# Patient Record
Sex: Female | Born: 1975 | Race: White | Hispanic: No | Smoking: Current every day smoker
Health system: Southern US, Community
[De-identification: ages and names within clinical notes are randomized; demographics above are authoritative.]

## PROBLEM LIST (undated history)

## (undated) ENCOUNTER — Ambulatory Visit (HOSPITAL_COMMUNITY): Admission: EM | Payer: Medicaid Other | Source: Home / Self Care

## (undated) DIAGNOSIS — I10 Essential (primary) hypertension: Secondary | ICD-10-CM

## (undated) DIAGNOSIS — F32A Depression, unspecified: Secondary | ICD-10-CM

## (undated) DIAGNOSIS — N200 Calculus of kidney: Secondary | ICD-10-CM

## (undated) DIAGNOSIS — F329 Major depressive disorder, single episode, unspecified: Secondary | ICD-10-CM

## (undated) DIAGNOSIS — F1110 Opioid abuse, uncomplicated: Secondary | ICD-10-CM

## (undated) DIAGNOSIS — F419 Anxiety disorder, unspecified: Secondary | ICD-10-CM

## (undated) DIAGNOSIS — B009 Herpesviral infection, unspecified: Secondary | ICD-10-CM

## (undated) DIAGNOSIS — B1920 Unspecified viral hepatitis C without hepatic coma: Secondary | ICD-10-CM

## (undated) DIAGNOSIS — G43909 Migraine, unspecified, not intractable, without status migrainosus: Secondary | ICD-10-CM

## (undated) HISTORY — PX: TUBAL LIGATION: SHX77

## (undated) HISTORY — PX: OTHER SURGICAL HISTORY: SHX169

## (undated) HISTORY — PX: LITHOTRIPSY: SUR834

## (undated) HISTORY — PX: BREAST BIOPSY: SHX20

## (undated) HISTORY — PX: ELBOW SURGERY: SHX618

## (undated) HISTORY — PX: KIDNEY STONE SURGERY: SHX686

---

## 2000-12-30 ENCOUNTER — Encounter: Payer: Self-pay | Admitting: Emergency Medicine

## 2000-12-30 ENCOUNTER — Emergency Department (HOSPITAL_COMMUNITY): Admission: EM | Admit: 2000-12-30 | Discharge: 2000-12-30 | Payer: Self-pay | Admitting: Emergency Medicine

## 2003-08-02 ENCOUNTER — Emergency Department (HOSPITAL_COMMUNITY): Admission: EM | Admit: 2003-08-02 | Discharge: 2003-08-02 | Payer: Self-pay | Admitting: Emergency Medicine

## 2005-04-28 ENCOUNTER — Inpatient Hospital Stay (HOSPITAL_COMMUNITY): Admission: RE | Admit: 2005-04-28 | Discharge: 2005-05-10 | Payer: Self-pay | Admitting: Psychiatry

## 2005-04-29 ENCOUNTER — Ambulatory Visit: Payer: Self-pay | Admitting: Psychiatry

## 2005-05-07 ENCOUNTER — Ambulatory Visit: Payer: Self-pay | Admitting: Psychiatry

## 2005-09-13 ENCOUNTER — Emergency Department (HOSPITAL_COMMUNITY): Admission: EM | Admit: 2005-09-13 | Discharge: 2005-09-14 | Payer: Self-pay | Admitting: Emergency Medicine

## 2005-09-25 ENCOUNTER — Emergency Department (HOSPITAL_COMMUNITY): Admission: EM | Admit: 2005-09-25 | Discharge: 2005-09-25 | Payer: Self-pay | Admitting: Emergency Medicine

## 2009-08-16 ENCOUNTER — Ambulatory Visit: Payer: Self-pay | Admitting: Diagnostic Radiology

## 2009-08-16 ENCOUNTER — Emergency Department (HOSPITAL_BASED_OUTPATIENT_CLINIC_OR_DEPARTMENT_OTHER): Admission: EM | Admit: 2009-08-16 | Discharge: 2009-08-16 | Payer: Self-pay | Admitting: Emergency Medicine

## 2009-08-19 ENCOUNTER — Emergency Department (HOSPITAL_BASED_OUTPATIENT_CLINIC_OR_DEPARTMENT_OTHER): Admission: EM | Admit: 2009-08-19 | Discharge: 2009-08-19 | Payer: Self-pay | Admitting: Emergency Medicine

## 2009-08-19 ENCOUNTER — Ambulatory Visit: Payer: Self-pay | Admitting: Radiology

## 2009-11-17 ENCOUNTER — Emergency Department (HOSPITAL_BASED_OUTPATIENT_CLINIC_OR_DEPARTMENT_OTHER): Admission: EM | Admit: 2009-11-17 | Discharge: 2009-11-17 | Payer: Self-pay | Admitting: Emergency Medicine

## 2009-11-17 ENCOUNTER — Ambulatory Visit: Payer: Self-pay | Admitting: Radiology

## 2010-01-30 ENCOUNTER — Emergency Department (HOSPITAL_BASED_OUTPATIENT_CLINIC_OR_DEPARTMENT_OTHER): Admission: EM | Admit: 2010-01-30 | Discharge: 2010-01-30 | Payer: Self-pay | Admitting: Emergency Medicine

## 2010-06-23 ENCOUNTER — Emergency Department (HOSPITAL_BASED_OUTPATIENT_CLINIC_OR_DEPARTMENT_OTHER)
Admission: EM | Admit: 2010-06-23 | Discharge: 2010-06-23 | Payer: Self-pay | Source: Home / Self Care | Admitting: Emergency Medicine

## 2010-06-24 LAB — CBC
HCT: 41.6 % (ref 36.0–46.0)
Hemoglobin: 14.3 g/dL (ref 12.0–15.0)
MCH: 28.5 pg (ref 26.0–34.0)
MCHC: 34.4 g/dL (ref 30.0–36.0)
MCV: 83 fL (ref 78.0–100.0)
Platelets: 393 10*3/uL (ref 150–400)
RBC: 5.01 MIL/uL (ref 3.87–5.11)
RDW: 13 % (ref 11.5–15.5)
WBC: 9.4 10*3/uL (ref 4.0–10.5)

## 2010-06-24 LAB — COMPREHENSIVE METABOLIC PANEL
ALT: 17 U/L (ref 0–35)
AST: 18 U/L (ref 0–37)
Albumin: 4.3 g/dL (ref 3.5–5.2)
Alkaline Phosphatase: 68 U/L (ref 39–117)
BUN: 9 mg/dL (ref 6–23)
CO2: 25 mEq/L (ref 19–32)
Calcium: 9.2 mg/dL (ref 8.4–10.5)
Chloride: 107 mEq/L (ref 96–112)
Creatinine, Ser: 0.7 mg/dL (ref 0.4–1.2)
GFR calc Af Amer: >60 mL/min (ref >60)
GFR calc non Af Amer: >60 mL/min (ref >60)
Glucose, Bld: 105 mg/dL — ABNORMAL HIGH (ref 70–99)
Potassium: 4 mEq/L (ref 3.5–5.1)
Sodium: 142 mEq/L (ref 135–145)
Total Bilirubin: 0.8 mg/dL (ref 0.3–1.2)
Total Protein: 7.4 g/dL (ref 6.0–8.3)

## 2010-06-24 LAB — URINALYSIS, ROUTINE W REFLEX MICROSCOPIC
Bilirubin Urine: NEGATIVE
Ketones, ur: NEGATIVE mg/dL
Leukocytes, UA: NEGATIVE
Nitrite: NEGATIVE
Protein, ur: NEGATIVE mg/dL
Specific Gravity, Urine: 1.013 (ref 1.005–1.030)
Urine Glucose, Fasting: NEGATIVE mg/dL
Urobilinogen, UA: 0.2 mg/dL (ref 0.0–1.0)
pH: 8.5 — ABNORMAL HIGH (ref 5.0–8.0)

## 2010-06-24 LAB — URINE MICROSCOPIC-ADD ON

## 2010-06-24 LAB — DIFFERENTIAL
Basophils Absolute: 0 10*3/uL (ref 0.0–0.1)
Basophils Relative: 0 % (ref 0–1)
Eosinophils Absolute: 0 10*3/uL (ref 0.0–0.7)
Eosinophils Relative: 0 % (ref 0–5)
Lymphocytes Relative: 12 % (ref 12–46)
Lymphs Abs: 1.1 10*3/uL (ref 0.7–4.0)
Monocytes Absolute: 0.6 10*3/uL (ref 0.1–1.0)
Monocytes Relative: 6 % (ref 3–12)
Neutro Abs: 7.7 10*3/uL (ref 1.7–7.7)
Neutrophils Relative %: 82 % — ABNORMAL HIGH (ref 43–77)

## 2010-06-24 LAB — PREGNANCY, URINE: Preg Test, Ur: NEGATIVE

## 2010-06-24 LAB — LIPASE, BLOOD: Lipase: 33 U/L (ref 23–300)

## 2010-08-21 LAB — URINALYSIS, ROUTINE W REFLEX MICROSCOPIC
Bilirubin Urine: NEGATIVE
Glucose, UA: NEGATIVE mg/dL
Hgb urine dipstick: NEGATIVE
Ketones, ur: 40 mg/dL — AB
Nitrite: NEGATIVE
Protein, ur: NEGATIVE mg/dL
Specific Gravity, Urine: 1.028 (ref 1.005–1.030)
Urobilinogen, UA: 0.2 mg/dL (ref 0.0–1.0)
pH: 6 (ref 5.0–8.0)

## 2010-08-21 LAB — PREGNANCY, URINE: Preg Test, Ur: NEGATIVE

## 2010-08-24 LAB — COMPREHENSIVE METABOLIC PANEL
ALT: 12 U/L (ref 0–35)
AST: 18 U/L (ref 0–37)
Albumin: 4.5 g/dL (ref 3.5–5.2)
Alkaline Phosphatase: 60 U/L (ref 39–117)
BUN: 6 mg/dL (ref 6–23)
CO2: 25 mEq/L (ref 19–32)
Calcium: 9.6 mg/dL (ref 8.4–10.5)
Chloride: 108 mEq/L (ref 96–112)
Creatinine, Ser: 0.8 mg/dL (ref 0.4–1.2)
GFR calc Af Amer: >60 mL/min (ref >60)
GFR calc non Af Amer: >60 mL/min (ref >60)
Glucose, Bld: 131 mg/dL — ABNORMAL HIGH (ref 70–99)
Potassium: 3.2 mEq/L — ABNORMAL LOW (ref 3.5–5.1)
Sodium: 147 mEq/L — ABNORMAL HIGH (ref 135–145)
Total Bilirubin: 0.7 mg/dL (ref 0.3–1.2)
Total Protein: 8.3 g/dL (ref 6.0–8.3)

## 2010-08-24 LAB — URINE CULTURE
Colony Count: NO GROWTH
Culture: NO GROWTH

## 2010-08-24 LAB — URINALYSIS, ROUTINE W REFLEX MICROSCOPIC
Glucose, UA: NEGATIVE mg/dL
Hgb urine dipstick: NEGATIVE
Ketones, ur: 15 mg/dL — AB
Nitrite: NEGATIVE
Protein, ur: NEGATIVE mg/dL
Specific Gravity, Urine: 1.027 (ref 1.005–1.030)
Urobilinogen, UA: 1 mg/dL (ref 0.0–1.0)
pH: 6 (ref 5.0–8.0)

## 2010-08-24 LAB — CBC
HCT: 40.9 % (ref 36.0–46.0)
Hemoglobin: 13.9 g/dL (ref 12.0–15.0)
MCHC: 34 g/dL (ref 30.0–36.0)
MCV: 89.6 fL (ref 78.0–100.0)
Platelets: 346 10*3/uL (ref 150–400)
RBC: 4.56 MIL/uL (ref 3.87–5.11)
RDW: 12.6 % (ref 11.5–15.5)
WBC: 5.5 10*3/uL (ref 4.0–10.5)

## 2010-08-24 LAB — WET PREP, GENITAL
Clue Cells Wet Prep HPF POC: NONE SEEN
Trich, Wet Prep: NONE SEEN
Yeast Wet Prep HPF POC: NONE SEEN

## 2010-08-24 LAB — DIFFERENTIAL
Basophils Absolute: 0 10*3/uL (ref 0.0–0.1)
Basophils Relative: 0 % (ref 0–1)
Eosinophils Absolute: 0.1 10*3/uL (ref 0.0–0.7)
Eosinophils Relative: 1 % (ref 0–5)
Lymphocytes Relative: 19 % (ref 12–46)
Lymphs Abs: 1 10*3/uL (ref 0.7–4.0)
Monocytes Absolute: 0.3 10*3/uL (ref 0.1–1.0)
Monocytes Relative: 5 % (ref 3–12)
Neutro Abs: 4.1 10*3/uL (ref 1.7–7.7)
Neutrophils Relative %: 74 % (ref 43–77)

## 2010-08-24 LAB — LIPASE, BLOOD: Lipase: 50 U/L (ref 23–300)

## 2010-08-24 LAB — GC/CHLAMYDIA PROBE AMP, GENITAL
Chlamydia, DNA Probe: NEGATIVE
GC Probe Amp, Genital: NEGATIVE

## 2010-08-24 LAB — PREGNANCY, URINE: Preg Test, Ur: NEGATIVE

## 2010-08-31 LAB — URINALYSIS, ROUTINE W REFLEX MICROSCOPIC
Bilirubin Urine: NEGATIVE
Glucose, UA: NEGATIVE mg/dL
Hgb urine dipstick: NEGATIVE
Ketones, ur: NEGATIVE mg/dL
Ketones, ur: NEGATIVE mg/dL
Nitrite: NEGATIVE
Nitrite: NEGATIVE
Protein, ur: NEGATIVE mg/dL
Specific Gravity, Urine: 1.011 (ref 1.005–1.030)
Specific Gravity, Urine: 1.011 (ref 1.005–1.030)
Urobilinogen, UA: 0.2 mg/dL (ref 0.0–1.0)
pH: 6.5 (ref 5.0–8.0)
pH: 7 (ref 5.0–8.0)

## 2010-08-31 LAB — BASIC METABOLIC PANEL
BUN: 10 mg/dL (ref 6–23)
GFR calc non Af Amer: >60 mL/min (ref >60)
Glucose, Bld: 83 mg/dL (ref 70–99)
Potassium: 4 mEq/L (ref 3.5–5.1)

## 2010-08-31 LAB — WET PREP, GENITAL
Trich, Wet Prep: NONE SEEN
Yeast Wet Prep HPF POC: NONE SEEN

## 2010-08-31 LAB — PREGNANCY, URINE: Preg Test, Ur: NEGATIVE

## 2010-08-31 LAB — DIFFERENTIAL
Basophils Absolute: 0 10*3/uL (ref 0.0–0.1)
Eosinophils Absolute: 0.1 10*3/uL (ref 0.0–0.7)
Eosinophils Relative: 2 % (ref 0–5)
Lymphocytes Relative: 27 % (ref 12–46)

## 2010-08-31 LAB — CBC
HCT: 40.7 % (ref 36.0–46.0)
MCV: 89 fL (ref 78.0–100.0)
Platelets: 305 10*3/uL (ref 150–400)
RDW: 12.4 % (ref 11.5–15.5)

## 2010-08-31 LAB — URINE MICROSCOPIC-ADD ON

## 2010-08-31 LAB — GC/CHLAMYDIA PROBE AMP, GENITAL
Chlamydia, DNA Probe: NEGATIVE
GC Probe Amp, Genital: NEGATIVE

## 2010-10-23 NOTE — Discharge Summary (Signed)
NAME:  Nancy Terry, Nancy Terry NO.:  [id number]   MEDICAL RECORD NO.:  [id number]          PATIENT TYPE:  IPS   LOCATION:  0504                          FACILITY:  [facility]   PHYSICIAN:  Geoffery Lyons, M.D.      DATE OF BIRTH:  27-Feb-1976   DATE OF ADMISSION:  04/28/2005  DATE OF DISCHARGE:  05/10/2005                                 DISCHARGE SUMMARY   CHIEF COMPLAINT AND PRESENT ILLNESS:  This was the first admission to [facility] for this 35 year old white female, married,  voluntarily admitted.  Using Valium and Xanax.  IV drug use of heroin in the  past.  Crushing and shooting OxyContin for the last two weeks as well as  morphine.  Tried to detox herself.  Endorsed being depressed, irritable.  Mood swings, impulsive anger.   PAST PSYCHIATRIC HISTORY:  First time at [facility].  History of  nervous breakdown.  First time detoxed.   ALCOHOL/DRUG HISTORY:  As already stated, persistent use of mainly opiates.   MEDICAL HISTORY:  Stent placement in the left ureter.   MEDICATIONS:  Valium and Xanax.   PHYSICAL EXAMINATION:  Performed and failed to show any acute findings.   LABORATORY DATA:  Blood chemistry with potassium 3.4, sodium 143, glucose  119.  CBC with hemoglobin 14.3, hematocrit 42.  Drug screen positive for  opiates, benzodiazepines and marijuana.   MENTAL STATUS EXAM:  Alert, cooperative female.  Somewhat irritable,  anxious.  Speech normal rate, tempo and production although there was some  psychomotor retardation.  Mood anxious, irritable.  Affect anxious.  Thought  processes logical, coherent and relevant.  Focusing on her physical  complaints, being overwhelmed, some suicidal ruminations.  Wanting to get  better.  Cognition was well-preserved.   ADMISSION DIAGNOSES:  AXIS I:  Opiate dependence.  Benzodiazepine and  marijuana abuse.  AXIS II:  No diagnosis.  AXIS III:  Urinary calculi, pyuria not otherwise specified.  AXIS IV:   Moderate.  AXIS V:  GAF upon admission 30; highest GAF in the last year 65.   HOSPITAL COURSE:  She was admitted.  She was started in individual and group  psychotherapy.  She was initially on Percocet 1-2 as needed.  She was  abusing Ativan 0.5 mg twice a day, Valium 10 mg at night, Levaquin 500 mg  per day and clonidine 0.5 mg three times a day.  She was detoxed with  clonidine and Librium.  She was given Neurontin and trazodone for sleep.  We  continued to work with the trazodone.  She did endorse extensive history of  polysubstance dependence, lately IV opiates, said she would not get anything  from p.o. pain pills by mouth.  She started injecting.  Wanted to feel the  rush immediately.  She is married, self-employed.  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  Husband also opiate-dependent and also used cocaine.  He was  being admitted to [facility] for the same reason.  They were getting  to have a hard working.  She had three children,  sent them back to their  father who was not her present husband.  Tried to take care of the problem.  She has used benzodiazepines for years.  She minimized having a problem with  benzodiazepines.  Concerns were how to handle her anxiety once she was  detoxed.  She was on methadone up to 240 mg per day.  Started coming down,  came down to 90 mg and then she quit.  She compromised her system.  She had  a very difficult withdrawal, endorsed pain, cramping, GI distress, severe  problem with sleep, wanting to go to a residential program.  Continued to  endorse the GI symptoms, cramping, weakness, unable to keep food, decreased  appetite, upset, tearful, labile affect.  We extended the Librium detox and  increased the Neurontin.  She continued to struggle.  Trazodone was not  helpful with sleep.  Continued to increase the trazodone but it was not  effective.  We could not use the clonidine due to decreased blood pressure.  She was very upset with the  symptoms.  Very anxious, very fragile.  As she  detoxed, she got more and more depressed.  Still difficulty with anxiety,  with sleep.  We started working with the Zyprexa, did get some benefit from  the trazodone for anxiety.  She was wanting to maintain the trazodone.  Very  tearful.  She did complain of some racing thoughts or sleep.  By May 07, 2005, we started turning the corner.  The withdrawal symptoms were  improving.  We went ahead and had started the Lexapro and went up to 20 mg.  Sleep started getting better with the Zyprexa.  On May 10, 2005, she was  in full contact with reality.  There were no suicidal ideation, no homicidal  ideation, no hallucinations, no delusions.  Objectively, she was better.  There were still some mild lingering symptoms but she had tolerated the  medication well.  She was more hopeful.  She was going to pursue outpatient  treatment.  She was going to be eventually reuniting with the husband who  she felt was in a halfway house.   DISCHARGE DIAGNOSES:  AXIS I:  Opiate dependence.  Benzodiazepine and  marijuana abuse.  Mood disorder not otherwise specified.  AXIS II:  No diagnosis.  AXIS III:  Renal calculus, status post stent placement.  AXIS IV:  Moderate.  AXIS V:  GAF upon discharge 50-55.   DISCHARGE MEDICATIONS:  1.  Zyprexa 2.5 mg twice a day.  2.  Zyprexa Zydis 20 mg at night.  3.  Trazodone 50 mg twice a day.  4.  Trazodone 100 mg, 2 at night.  5.  Lexapro 20 mg per day.   FOLLOW UP:  [facility].  As she gets more and more  stable, these medications, especially Zyprexa, need to be reviewed and  probably decreased.      Geoffery Lyons, M.D.  Electronically Signed     IL/MEDQ  D:  05/17/2005  T:  05/18/2005  Job:  [id number]

## 2010-11-16 ENCOUNTER — Emergency Department (HOSPITAL_BASED_OUTPATIENT_CLINIC_OR_DEPARTMENT_OTHER)
Admission: EM | Admit: 2010-11-16 | Discharge: 2010-11-16 | Disposition: A | Discharge status: Home / Self Care | Payer: Self-pay | Attending: Emergency Medicine | Admitting: Emergency Medicine

## 2010-11-16 DIAGNOSIS — R51 Headache: Secondary | ICD-10-CM | POA: Insufficient documentation

## 2010-11-16 DIAGNOSIS — G8929 Other chronic pain: Secondary | ICD-10-CM | POA: Insufficient documentation

## 2010-12-04 ENCOUNTER — Emergency Department (HOSPITAL_BASED_OUTPATIENT_CLINIC_OR_DEPARTMENT_OTHER)
Admission: EM | Admit: 2010-12-04 | Discharge: 2010-12-04 | Disposition: A | Discharge status: Home / Self Care | Payer: Self-pay | Attending: Emergency Medicine | Admitting: Emergency Medicine

## 2010-12-04 DIAGNOSIS — G8929 Other chronic pain: Secondary | ICD-10-CM | POA: Insufficient documentation

## 2010-12-04 DIAGNOSIS — A6000 Herpesviral infection of urogenital system, unspecified: Secondary | ICD-10-CM | POA: Insufficient documentation

## 2011-03-06 ENCOUNTER — Encounter: Payer: Self-pay | Admitting: Emergency Medicine

## 2011-03-06 ENCOUNTER — Emergency Department (HOSPITAL_BASED_OUTPATIENT_CLINIC_OR_DEPARTMENT_OTHER)
Admission: EM | Admit: 2011-03-06 | Discharge: 2011-03-06 | Disposition: A | Discharge status: Home / Self Care | Payer: Self-pay | Attending: Emergency Medicine | Admitting: Emergency Medicine

## 2011-03-06 DIAGNOSIS — B9689 Other specified bacterial agents as the cause of diseases classified elsewhere: Secondary | ICD-10-CM | POA: Insufficient documentation

## 2011-03-06 DIAGNOSIS — A6000 Herpesviral infection of urogenital system, unspecified: Secondary | ICD-10-CM | POA: Insufficient documentation

## 2011-03-06 DIAGNOSIS — A499 Bacterial infection, unspecified: Secondary | ICD-10-CM | POA: Insufficient documentation

## 2011-03-06 DIAGNOSIS — K047 Periapical abscess without sinus: Secondary | ICD-10-CM | POA: Insufficient documentation

## 2011-03-06 DIAGNOSIS — F17200 Nicotine dependence, unspecified, uncomplicated: Secondary | ICD-10-CM | POA: Insufficient documentation

## 2011-03-06 DIAGNOSIS — N760 Acute vaginitis: Secondary | ICD-10-CM | POA: Insufficient documentation

## 2011-03-06 HISTORY — DX: Opioid abuse, uncomplicated: F11.10

## 2011-03-06 HISTORY — DX: Depression, unspecified: F32.A

## 2011-03-06 HISTORY — DX: Herpesviral infection, unspecified: B00.9

## 2011-03-06 HISTORY — DX: Anxiety disorder, unspecified: F41.9

## 2011-03-06 HISTORY — DX: Major depressive disorder, single episode, unspecified: F32.9

## 2011-03-06 LAB — PREGNANCY, URINE: Preg Test, Ur: NEGATIVE

## 2011-03-06 LAB — URINALYSIS, ROUTINE W REFLEX MICROSCOPIC
Nitrite: NEGATIVE
Specific Gravity, Urine: 1.01 (ref 1.005–1.030)
Urobilinogen, UA: 0.2 mg/dL (ref 0.0–1.0)

## 2011-03-06 LAB — URINE MICROSCOPIC-ADD ON

## 2011-03-06 LAB — WET PREP, GENITAL

## 2011-03-06 MED ORDER — OXYCODONE-ACETAMINOPHEN 5-325 MG PO TABS: 2.0000 | ORAL_TABLET | ORAL | Status: AC | PRN

## 2011-03-06 MED ORDER — VALACYCLOVIR HCL 1 G PO TABS: 1000.0000 mg | ORAL_TABLET | Freq: Three times a day (TID) | ORAL | Status: AC

## 2011-03-06 MED ORDER — OXYCODONE-ACETAMINOPHEN 5-325 MG PO TABS
2.0000 | ORAL_TABLET | Freq: Once | ORAL | Status: AC
  Administered 2011-03-06: 2 via ORAL
  Filled 2011-03-06: qty 2

## 2011-03-06 MED ORDER — LORAZEPAM 2 MG/ML IJ SOLN
1.0000 mg | Freq: Once | INTRAMUSCULAR | Status: AC
  Administered 2011-03-06: 1 mg via INTRAMUSCULAR
  Filled 2011-03-06: qty 1

## 2011-03-06 MED ORDER — IBUPROFEN 800 MG PO TABS: 800.0000 mg | ORAL_TABLET | Freq: Three times a day (TID) | ORAL | Status: AC

## 2011-03-06 MED ORDER — KETOROLAC TROMETHAMINE 60 MG/2ML IM SOLN
60.0000 mg | Freq: Once | INTRAMUSCULAR | Status: AC
  Administered 2011-03-06: 60 mg via INTRAMUSCULAR
  Filled 2011-03-06: qty 2

## 2011-03-06 MED ORDER — PENICILLIN V POTASSIUM 250 MG PO TABS
500.0000 mg | ORAL_TABLET | Freq: Once | ORAL | Status: AC
  Administered 2011-03-06: 500 mg via ORAL
  Filled 2011-03-06: qty 2

## 2011-03-06 MED ORDER — ONDANSETRON 4 MG PO TBDP
4.0000 mg | ORAL_TABLET | Freq: Once | ORAL | Status: AC
  Administered 2011-03-06: 4 mg via ORAL
  Filled 2011-03-06: qty 1

## 2011-03-06 MED ORDER — ONDANSETRON HCL 4 MG PO TABS: 4.0000 mg | ORAL_TABLET | Freq: Four times a day (QID) | ORAL | Status: AC

## 2011-03-06 MED ORDER — PENICILLIN V POTASSIUM 500 MG PO TABS: 500.0000 mg | ORAL_TABLET | Freq: Four times a day (QID) | ORAL | Status: AC

## 2011-03-06 MED ORDER — METRONIDAZOLE 500 MG PO TABS: 500.0000 mg | ORAL_TABLET | Freq: Two times a day (BID) | ORAL | Status: AC

## 2011-03-06 NOTE — ED Provider Notes (Signed)
History     CSN: [id number] Arrival date & time: 03/06/2011  9:41 AM  Chief Complaint  Patient presents with  . Dental Pain  . Vaginal Pain    (Consider location/radiation/quality/duration/timing/severity/associated sxs/prior treatment) HPI Comments: Patient complains of left sided dental pain for the past 2 days with left-sided facial swelling. She reports subjective fevers at home she has not seen a dentist in over a year. She has no difficulty breathing or swallowing, she has no drooling. She denies any nausea or vomiting. She also complains of an outbreak of vaginal herpes which is diagnosed in the ER in June. She states acyclovir she was given causes her to have hallucinations and she is requesting Valtrex. She denies any discharge or vaginal bleeding. She denies abdominal pain or back pain.  The history is provided by the patient.    Past Medical History  Diagnosis Date  . Anxiety   . Herpes infection   . Depression   . Narcotic abuse     Past Surgical History  Procedure Date  . Arm surgery   . Tubal ligation     History reviewed. No pertinent family history.  History  Substance Use Topics  . Smoking status: Current Some Day Smoker -- 0.5 packs/day    Types: Cigarettes  . Smokeless tobacco: Not on file  . Alcohol Use: No    OB History    Grav Para Term Preterm Abortions TAB SAB Ect Mult Living                  Review of Systems  Constitutional: Negative for fever, activity change and appetite change.  HENT: Positive for dental problem. Negative for sore throat, drooling and trouble swallowing.   Respiratory: Negative for cough, chest tightness and shortness of breath.   Cardiovascular: Negative for chest pain.  Gastrointestinal: Negative for nausea, vomiting and abdominal pain.  Genitourinary: Positive for vaginal pain. Negative for dysuria and hematuria.  Neurological: Negative for headaches.    Allergies  Haldol  Home Medications   Current  Outpatient Rx  Name Route Sig Dispense Refill  . ALPRAZOLAM 0.25 MG PO TABS Oral Take 0.25 mg by mouth 2 (two) times daily.      Marland Kitchen ESCITALOPRAM OXALATE 20 MG PO TABS Oral Take 20 mg by mouth daily.        BP 115/68  Pulse 86  Temp(Src) 98.9 F (37.2 C) (Oral)  Resp 22  Ht 5\' 6"  (1.676 m)  Wt 130 lb (58.968 kg)  BMI 20.98 kg/m2  SpO2 100%  LMP 02/11/2011  Physical Exam  Constitutional: She is oriented to person, place, and time. She appears well-developed and well-nourished. No distress.  HENT:  Head: Normocephalic and atraumatic.  Mouth/Throat: Oropharynx is clear and moist. No oropharyngeal exudate.         Extremely poor dentition with only a few teeth remaining. She has old fractures of her upper incisors and bicuspids without obvious abscess. Floor of mouth is soft there are no intraoral lesions Very mild left-sided facial swelling.  Eyes: Conjunctivae are normal. Pupils are equal, round, and reactive to light.  Neck: Normal range of motion.  Cardiovascular: Normal rate, regular rhythm and normal heart sounds.   Pulmonary/Chest: Effort normal and breath sounds normal. No respiratory distress.  Abdominal: Soft. There is no tenderness. There is no rebound and no guarding.  Genitourinary: Cervix exhibits no discharge. No vaginal discharge found.       Few scattered vesicles on the left side of  labia majora. Vesicles evident on cervix. No discharge or bleeding.  Musculoskeletal: Normal range of motion. She exhibits no edema and no tenderness.  Neurological: She is alert and oriented to person, place, and time.  Skin: Skin is warm and dry.    ED Course  Procedures (including critical care time)  Labs Reviewed  URINALYSIS, ROUTINE W REFLEX MICROSCOPIC - Abnormal; Notable for the following:    Leukocytes, UA SMALL (*)    All other components within normal limits  URINE MICROSCOPIC-ADD ON - Abnormal; Notable for the following:    Squamous Epithelial / LPF FEW (*)     Bacteria, UA MANY (*)    All other components within normal limits  WET PREP, GENITAL - Abnormal; Notable for the following:    Clue Cells, Wet Prep FEW (*)    WBC, Wet Prep HPF POC MODERATE (*)    All other components within normal limits  PREGNANCY, URINE  GC/CHLAMYDIA PROBE AMP, GENITAL   No results found.   No diagnosis found.    MDM  Dental pain with probable early abscess as well as vaginal herpes outbreak. No fluctuant area to drain on exam today.  Patient is dissatisfied with oral pain medication in the ED. Repeatedly requesting for a shot to knock her out and is verbally abusive to staff.  i explained to the patient that given her exam findings today and her ability to tolerate by mouth there is no indication for IV or IM pain medications.        Glynn Octave, MD 03/06/11 1126

## 2011-03-06 NOTE — ED Notes (Signed)
Pt very angry.  States she doesn't understand why she received pills instead of a shot because it will take too long.  While attempting to explain medication dosing, pt interrupted, started to yell at this nurse. 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  Pt states "What kind of hospital is this?"  Related info to MD.

## 2011-03-06 NOTE — ED Notes (Signed)
Pt states she has a left sided toothache with some facial swelling x 1 day.  Pt also states she has genital herpes x one month and the lesions are getting worse.  No known fever, some chills.  Clear vaginal discharge.

## 2011-03-08 LAB — GC/CHLAMYDIA PROBE AMP, GENITAL
Chlamydia, DNA Probe: NEGATIVE
GC Probe Amp, Genital: NEGATIVE

## 2011-05-01 ENCOUNTER — Encounter (HOSPITAL_BASED_OUTPATIENT_CLINIC_OR_DEPARTMENT_OTHER): Payer: Self-pay | Admitting: *Deleted

## 2011-05-01 ENCOUNTER — Emergency Department (HOSPITAL_BASED_OUTPATIENT_CLINIC_OR_DEPARTMENT_OTHER)
Admission: EM | Admit: 2011-05-01 | Discharge: 2011-05-01 | Disposition: A | Discharge status: Home / Self Care | Payer: Self-pay | Attending: Emergency Medicine | Admitting: Emergency Medicine

## 2011-05-01 DIAGNOSIS — F17200 Nicotine dependence, unspecified, uncomplicated: Secondary | ICD-10-CM | POA: Insufficient documentation

## 2011-05-01 DIAGNOSIS — F341 Dysthymic disorder: Secondary | ICD-10-CM | POA: Insufficient documentation

## 2011-05-01 DIAGNOSIS — G43909 Migraine, unspecified, not intractable, without status migrainosus: Secondary | ICD-10-CM | POA: Insufficient documentation

## 2011-05-01 MED ORDER — PROMETHAZINE HCL 25 MG PO TABS: 25.0000 mg | ORAL_TABLET | Freq: Four times a day (QID) | ORAL | Status: DC | PRN

## 2011-05-01 MED ORDER — LORAZEPAM 1 MG PO TABS
2.0000 mg | ORAL_TABLET | Freq: Once | ORAL | Status: AC
  Administered 2011-05-01: 2 mg via ORAL
  Filled 2011-05-01: qty 2

## 2011-05-01 MED ORDER — DIPHENHYDRAMINE HCL 50 MG/ML IJ SOLN
25.0000 mg | Freq: Once | INTRAMUSCULAR | Status: AC
  Administered 2011-05-01: 25 mg via INTRAMUSCULAR
  Filled 2011-05-01: qty 1

## 2011-05-01 MED ORDER — DEXAMETHASONE SODIUM PHOSPHATE 10 MG/ML IJ SOLN
10.0000 mg | Freq: Once | INTRAMUSCULAR | Status: AC
  Administered 2011-05-01: 10 mg via INTRAMUSCULAR
  Filled 2011-05-01: qty 1

## 2011-05-01 MED ORDER — HYDROMORPHONE HCL PF 2 MG/ML IJ SOLN
2.0000 mg | Freq: Once | INTRAMUSCULAR | Status: AC
  Administered 2011-05-01: 2 mg via INTRAMUSCULAR
  Filled 2011-05-01: qty 1

## 2011-05-01 MED ORDER — PROMETHAZINE HCL 25 MG/ML IJ SOLN
25.0000 mg | Freq: Once | INTRAMUSCULAR | Status: AC
  Administered 2011-05-01: 25 mg via INTRAMUSCULAR
  Filled 2011-05-01: qty 1

## 2011-05-01 NOTE — ED Provider Notes (Signed)
History     CSN: [id number] Arrival date & time: 05/01/2011  1:00 PM   First MD Initiated Contact with Patient 05/01/11 1308      Chief Complaint  Patient presents with  . Migraine    (Consider location/radiation/quality/duration/timing/severity/associated sxs/prior treatment) HPI Comments: The patient has an established history of migraine headaches for which she states that this headache is typical.  She describes a dull headache involving her entire head, moderate to severe in intensity, with photophobia and phonophobia, without changes in her vision, hearing, or focal numbness, weakness, or other focal neurologic findings.  She reports that her physician is weaning her off of xanax and believes that her headache may have a component of medication withdrawal as cause.    Patient is a 35 y.o. female presenting with migraine. The history is provided by the patient.  Migraine This is a recurrent problem. The current episode started more than 2 days ago (Three days ago). The problem occurs constantly. The problem has not changed since onset.Associated symptoms include headaches. Pertinent negatives include no chest pain, no abdominal pain and no shortness of breath. Exacerbated by: light and sound. The symptoms are relieved by nothing. She has tried nothing for the symptoms.    Past Medical History  Diagnosis Date  . Anxiety   . Herpes infection   . Depression   . Narcotic abuse     Past Surgical History  Procedure Date  . Arm surgery   . Tubal ligation     History reviewed. No pertinent family history.  History  Substance Use Topics  . Smoking status: Current Some Day Smoker -- 0.5 packs/day    Types: Cigarettes  . Smokeless tobacco: Not on file  . Alcohol Use: No    OB History    Grav Para Term Preterm Abortions TAB SAB Ect Mult Living                  Review of Systems  Constitutional: Positive for appetite change. Negative for fever, chills and diaphoresis.    HENT: Negative for hearing loss, ear pain, congestion, sore throat, facial swelling, rhinorrhea, trouble swallowing, neck pain, neck stiffness, dental problem, sinus pressure and tinnitus.   Eyes: Positive for photophobia. Negative for pain, discharge, redness and visual disturbance.  Respiratory: Negative.  Negative for shortness of breath.   Cardiovascular: Negative.  Negative for chest pain.  Gastrointestinal: Positive for nausea and vomiting. Negative for abdominal pain.  Genitourinary: Negative.   Musculoskeletal: Negative for back pain.  Skin: Negative for rash.  Neurological: Positive for headaches. Negative for dizziness, seizures, syncope, light-headedness and numbness.  Psychiatric/Behavioral: Negative for confusion.    Allergies  Haldol  Home Medications   Current Outpatient Rx  Name Route Sig Dispense Refill  . ALPRAZOLAM 0.25 MG PO TABS Oral Take 0.25 mg by mouth 2 (two) times daily.      Marland Kitchen ESCITALOPRAM OXALATE 20 MG PO TABS Oral Take 20 mg by mouth daily.      Marland Kitchen PROMETHAZINE HCL 25 MG PO TABS Oral Take 1 tablet (25 mg total) by mouth every 6 (six) hours as needed for nausea. 20 tablet 0    BP 116/66  Pulse 76  Temp(Src) 98.2 F (36.8 C) (Oral)  Resp 18  Ht 5\' 4"  (1.626 m)  Wt 130 lb (58.968 kg)  BMI 22.31 kg/m2  SpO2 100%  Physical Exam  Constitutional: She is oriented to person, place, and time. She appears well-developed and well-nourished. She appears distressed.  HENT:  Head: Normocephalic and atraumatic.  Right Ear: External ear normal.  Left Ear: External ear normal.  Mouth/Throat: Oropharynx is clear and moist. No oropharyngeal exudate.  Eyes: Conjunctivae and EOM are normal. Pupils are equal, round, and reactive to light. Right eye exhibits no nystagmus. Left eye exhibits no nystagmus.  Fundoscopic exam:      The right eye shows no papilledema.       The left eye shows no papilledema.  Neck: Normal range of motion, full passive range of motion  without pain and phonation normal. Neck supple. Carotid bruit is not present.  Cardiovascular: Normal rate, regular rhythm, normal heart sounds and intact distal pulses.  Exam reveals no gallop and no friction rub.   No murmur heard. Pulmonary/Chest: Effort normal and breath sounds normal. No respiratory distress. She has no wheezes. She has no rales.  Abdominal: Soft. Bowel sounds are normal. She exhibits no distension. There is no tenderness. There is no rebound and no guarding.  Musculoskeletal: Normal range of motion. She exhibits no edema and no tenderness.  Neurological: She is alert and oriented to person, place, and time. She has normal reflexes. No cranial nerve deficit. She exhibits normal muscle tone. She displays a negative Romberg sign. Coordination normal. GCS eye subscore is 4. GCS verbal subscore is 5. GCS motor subscore is 6.       Visual fields are intact to confrontation, the patient has no papilledema on fundoscopy, normal coordination by finger-nose-finger and heel-shin tests, no dysdiadochokinesia, no cerebellar signs, and a nonfocal neurologic exam.  Skin: Skin is warm and dry. No rash noted. She is not diaphoretic.  Psychiatric: She has a normal mood and affect.    ED Course  Procedures (including critical care time)  Labs Reviewed - No data to display No results found.   1. Migraine headache       MDM  The patient has an established history of migraine headaches for which this headache is typical and she is without focal findings on her neurologic exam.  I do not see need for a head ct scan as her history and physical examination do not suggest focal neuropathology.  The patient's headache is relieved after medications and she states that she is ready for discharge home.        Felisa Bonier, MD 05/01/11 [phone]

## 2011-05-01 NOTE — ED Notes (Signed)
Patient states that her MD is weaning her too fast off xanax and this has been causing her HA and nausea

## 2011-05-01 NOTE — ED Notes (Signed)
Patient states that she is currently being weaned off xanax by her doctor and has had a migraine for the past three days, took ibuprofen this am, no relief, vomiting today

## 2013-02-28 ENCOUNTER — Ambulatory Visit: Payer: Self-pay | Admitting: Family Medicine

## 2013-03-19 ENCOUNTER — Emergency Department (HOSPITAL_BASED_OUTPATIENT_CLINIC_OR_DEPARTMENT_OTHER)
Admission: EM | Admit: 2013-03-19 | Discharge: 2013-03-19 | Disposition: A | Discharge status: Home / Self Care | Payer: Self-pay | Attending: Emergency Medicine | Admitting: Emergency Medicine

## 2013-03-19 ENCOUNTER — Ambulatory Visit: Payer: Self-pay

## 2013-03-19 ENCOUNTER — Encounter (HOSPITAL_BASED_OUTPATIENT_CLINIC_OR_DEPARTMENT_OTHER): Payer: Self-pay | Admitting: Emergency Medicine

## 2013-03-19 DIAGNOSIS — Z8619 Personal history of other infectious and parasitic diseases: Secondary | ICD-10-CM | POA: Insufficient documentation

## 2013-03-19 DIAGNOSIS — R110 Nausea: Secondary | ICD-10-CM | POA: Insufficient documentation

## 2013-03-19 DIAGNOSIS — R197 Diarrhea, unspecified: Secondary | ICD-10-CM | POA: Insufficient documentation

## 2013-03-19 DIAGNOSIS — Z87442 Personal history of urinary calculi: Secondary | ICD-10-CM | POA: Insufficient documentation

## 2013-03-19 DIAGNOSIS — Z9851 Tubal ligation status: Secondary | ICD-10-CM | POA: Insufficient documentation

## 2013-03-19 DIAGNOSIS — F19939 Other psychoactive substance use, unspecified with withdrawal, unspecified: Secondary | ICD-10-CM | POA: Insufficient documentation

## 2013-03-19 DIAGNOSIS — F1123 Opioid dependence with withdrawal: Secondary | ICD-10-CM

## 2013-03-19 DIAGNOSIS — Z79899 Other long term (current) drug therapy: Secondary | ICD-10-CM | POA: Insufficient documentation

## 2013-03-19 DIAGNOSIS — G43909 Migraine, unspecified, not intractable, without status migrainosus: Secondary | ICD-10-CM | POA: Insufficient documentation

## 2013-03-19 DIAGNOSIS — IMO0001 Reserved for inherently not codable concepts without codable children: Secondary | ICD-10-CM | POA: Insufficient documentation

## 2013-03-19 DIAGNOSIS — F3289 Other specified depressive episodes: Secondary | ICD-10-CM | POA: Insufficient documentation

## 2013-03-19 DIAGNOSIS — R109 Unspecified abdominal pain: Secondary | ICD-10-CM | POA: Insufficient documentation

## 2013-03-19 DIAGNOSIS — F411 Generalized anxiety disorder: Secondary | ICD-10-CM | POA: Insufficient documentation

## 2013-03-19 DIAGNOSIS — F17200 Nicotine dependence, unspecified, uncomplicated: Secondary | ICD-10-CM | POA: Insufficient documentation

## 2013-03-19 DIAGNOSIS — F329 Major depressive disorder, single episode, unspecified: Secondary | ICD-10-CM | POA: Insufficient documentation

## 2013-03-19 HISTORY — DX: Migraine, unspecified, not intractable, without status migrainosus: G43.909

## 2013-03-19 HISTORY — DX: Calculus of kidney: N20.0

## 2013-03-19 MED ORDER — METOCLOPRAMIDE HCL 10 MG PO TABS
10.0000 mg | ORAL_TABLET | Freq: Once | ORAL | Status: AC
  Administered 2013-03-19: 10 mg via ORAL
  Filled 2013-03-19: qty 1

## 2013-03-19 MED ORDER — IBUPROFEN 600 MG PO TABS
600.0000 mg | ORAL_TABLET | Freq: Four times a day (QID) | ORAL | Status: DC | PRN
Start: 2013-03-19 — End: 2014-11-19

## 2013-03-19 MED ORDER — CLONIDINE HCL 0.2 MG PO TABS: 0.1000 mg | ORAL_TABLET | Freq: Two times a day (BID) | ORAL | Status: DC

## 2013-03-19 MED ORDER — PROMETHAZINE HCL 25 MG PO TABS: 25.0000 mg | ORAL_TABLET | Freq: Four times a day (QID) | ORAL | Status: DC | PRN

## 2013-03-19 MED ORDER — LOPERAMIDE HCL 2 MG PO CAPS: 2.0000 mg | ORAL_CAPSULE | Freq: Four times a day (QID) | ORAL | Status: DC | PRN

## 2013-03-19 MED ORDER — PROMETHAZINE HCL 25 MG PO TABS
25.0000 mg | ORAL_TABLET | Freq: Once | ORAL | Status: AC
  Administered 2013-03-19: 25 mg via ORAL
  Filled 2013-03-19: qty 1

## 2013-03-19 MED ORDER — CLONIDINE HCL 0.1 MG PO TABS
0.1000 mg | ORAL_TABLET | Freq: Once | ORAL | Status: AC
  Administered 2013-03-19: 0.1 mg via ORAL
  Filled 2013-03-19: qty 1

## 2013-03-19 NOTE — ED Notes (Signed)
Pt here requesting methadone dose because she hasn't had a dose since Thursday. Pt not taking xanax or lexapro due to money issues.

## 2013-03-19 NOTE — ED Provider Notes (Signed)
CSN: [id number]     Arrival date & time 03/19/13  0114 History   First MD Initiated Contact with Patient 03/19/13 0130     Chief Complaint  Patient presents with  . medication issue    (Consider location/radiation/quality/duration/timing/severity/associated sxs/prior Treatment) HPI Comments: Pt comes in w/ cc of opiate withdrawal. Pt is on methadone, last use was 3 days ago. Pt lost her job, ran out of money. Now having diffse body aches, hot flashes, nausea, loose BM. Pt has no chest pain, dib. Denies substance abuse.  The history is provided by the patient.    Past Medical History  Diagnosis Date  . Anxiety   . Herpes infection   . Depression   . Narcotic abuse   . Kidney stones   . Migraines    Past Surgical History  Procedure Laterality Date  . Arm surgery    . Tubal ligation     No family history on file. History  Substance Use Topics  . Smoking status: Current Some Day Smoker -- 0.50 packs/day    Types: Cigarettes  . Smokeless tobacco: Not on file  . Alcohol Use: No   OB History   Grav Para Term Preterm Abortions TAB SAB Ect Mult Living                 Review of Systems  Constitutional: Positive for activity change. Negative for diaphoresis.  Respiratory: Negative for shortness of breath.   Cardiovascular: Negative for chest pain.  Gastrointestinal: Positive for nausea, abdominal pain and diarrhea. Negative for vomiting.  Genitourinary: Negative for dysuria.  Musculoskeletal: Positive for myalgias. Negative for neck pain.  Neurological: Negative for headaches.    Allergies  Haldol  Home Medications   Current Outpatient Rx  Name  Route  Sig  Dispense  Refill  . methadone (DOLOPHINE) 10 MG/5ML solution   Oral   Take by mouth every 6 (six) hours as needed for pain. 55mg  per day         . ALPRAZolam (XANAX) 0.25 MG tablet   Oral   Take 0.25 mg by mouth 2 (two) times daily.           . cloNIDine (CATAPRES) 0.2 MG tablet   Oral   Take 0.5  tablets (0.1 mg total) by mouth 2 (two) times daily.   14 tablet   0   . escitalopram (LEXAPRO) 20 MG tablet   Oral   Take 20 mg by mouth daily.           Marland Kitchen ibuprofen (ADVIL,MOTRIN) 600 MG tablet   Oral   Take 1 tablet (600 mg total) by mouth every 6 (six) hours as needed for pain.   30 tablet   0   . loperamide (IMODIUM) 2 MG capsule   Oral   Take 1 capsule (2 mg total) by mouth 4 (four) times daily as needed for diarrhea or loose stools.   20 capsule   0   . EXPIRED: promethazine (PHENERGAN) 25 MG tablet   Oral   Take 1 tablet (25 mg total) by mouth every 6 (six) hours as needed for nausea.   20 tablet   0   . promethazine (PHENERGAN) 25 MG tablet   Oral   Take 1 tablet (25 mg total) by mouth every 6 (six) hours as needed for nausea.   30 tablet   0    BP 129/68  Pulse 93  Temp(Src) 98.6 F (37 C) (Oral)  Resp 18  Ht  5\' 6"  (1.676 m)  Wt 134 lb (60.782 kg)  BMI 21.64 kg/m2  SpO2 100%  LMP 03/02/2013 Physical Exam  Constitutional: She is oriented to person, place, and time. She appears well-developed and well-nourished.  HENT:  Head: Normocephalic and atraumatic.  Eyes: EOM are normal. Pupils are equal, round, and reactive to light.  Neck: Neck supple.  Cardiovascular: Normal rate, regular rhythm and normal heart sounds.   No murmur heard. Pulmonary/Chest: Effort normal. No respiratory distress.  Abdominal: Soft. She exhibits no distension. There is no tenderness. There is no rebound and no guarding.  Neurological: She is alert and oriented to person, place, and time.  Skin: Skin is warm and dry.    ED Course  Procedures (including critical care time) Labs Review Labs Reviewed - No data to display Imaging Review No results found.  EKG Interpretation   None       MDM   1. Methadone withdrawal without complication     Date: 03/19/2013  Rate: 58  Rhythm: normal sinus rhythm  QRS Axis: normal  Intervals: normal  ST/T Wave abnormalities:  normal  Conduction Disutrbances: none  Narrative Interpretation: unremarkable  Pt comes in w/ myalgias, nausea, hot flashes, loose BM. Hx of long term methadone use, last use was 3 days ago. Pt is going through opiate withdrawals. Will give clonidine 0.1 mg bid and promethazine and motrin for sx relief.  Called the [facility] and left a message for the counsellors to see if they can help her with her financial situation.     Derwood Kaplan, MD 03/19/13 [phone]

## 2013-03-19 NOTE — ED Notes (Signed)
MD at bedside. 

## 2013-03-19 NOTE — ED Notes (Signed)
Pt given rx x 4- d/c home by cab

## 2013-03-19 NOTE — ED Notes (Signed)
Pt states that she takes Methdone 55mg  everyday since 2009. States has not been able to afford the methadone for the past 3 days. Pt is currently being treated at the [facility]. C/o general body aches and not able to eat or drink.

## 2013-03-19 NOTE — ED Notes (Signed)
Pt continues to request meds for sleep, MD made aware. No new orders rec'd at this time.

## 2013-04-10 ENCOUNTER — Encounter: Payer: Self-pay | Admitting: Internal Medicine

## 2013-05-21 ENCOUNTER — Encounter: Payer: Self-pay | Admitting: Internal Medicine

## 2013-09-17 ENCOUNTER — Ambulatory Visit: Payer: Self-pay

## 2013-09-22 ENCOUNTER — Emergency Department (HOSPITAL_BASED_OUTPATIENT_CLINIC_OR_DEPARTMENT_OTHER)
Admission: EM | Admit: 2013-09-22 | Discharge: 2013-09-22 | Disposition: A | Discharge status: Home / Self Care | Payer: Self-pay | Attending: Emergency Medicine | Admitting: Emergency Medicine

## 2013-09-22 ENCOUNTER — Encounter (HOSPITAL_BASED_OUTPATIENT_CLINIC_OR_DEPARTMENT_OTHER): Payer: Self-pay | Admitting: Emergency Medicine

## 2013-09-22 DIAGNOSIS — F329 Major depressive disorder, single episode, unspecified: Secondary | ICD-10-CM | POA: Insufficient documentation

## 2013-09-22 DIAGNOSIS — R4789 Other speech disturbances: Secondary | ICD-10-CM | POA: Insufficient documentation

## 2013-09-22 DIAGNOSIS — K029 Dental caries, unspecified: Secondary | ICD-10-CM | POA: Insufficient documentation

## 2013-09-22 DIAGNOSIS — F17200 Nicotine dependence, unspecified, uncomplicated: Secondary | ICD-10-CM | POA: Insufficient documentation

## 2013-09-22 DIAGNOSIS — F3289 Other specified depressive episodes: Secondary | ICD-10-CM | POA: Insufficient documentation

## 2013-09-22 DIAGNOSIS — H6120 Impacted cerumen, unspecified ear: Secondary | ICD-10-CM | POA: Insufficient documentation

## 2013-09-22 DIAGNOSIS — Z792 Long term (current) use of antibiotics: Secondary | ICD-10-CM | POA: Insufficient documentation

## 2013-09-22 DIAGNOSIS — G43909 Migraine, unspecified, not intractable, without status migrainosus: Secondary | ICD-10-CM | POA: Insufficient documentation

## 2013-09-22 DIAGNOSIS — Z87442 Personal history of urinary calculi: Secondary | ICD-10-CM | POA: Insufficient documentation

## 2013-09-22 DIAGNOSIS — R51 Headache: Secondary | ICD-10-CM | POA: Insufficient documentation

## 2013-09-22 DIAGNOSIS — Z79899 Other long term (current) drug therapy: Secondary | ICD-10-CM | POA: Insufficient documentation

## 2013-09-22 DIAGNOSIS — Z8619 Personal history of other infectious and parasitic diseases: Secondary | ICD-10-CM | POA: Insufficient documentation

## 2013-09-22 DIAGNOSIS — F411 Generalized anxiety disorder: Secondary | ICD-10-CM | POA: Insufficient documentation

## 2013-09-22 MED ORDER — CARBAMIDE PEROXIDE 6.5 % OT SOLN: 5.0000 [drp] | Freq: Two times a day (BID) | OTIC | Status: DC

## 2013-09-22 MED ORDER — PENICILLIN V POTASSIUM 250 MG PO TABS
500.0000 mg | ORAL_TABLET | Freq: Once | ORAL | Status: AC
  Administered 2013-09-22: 500 mg via ORAL
  Filled 2013-09-22: qty 2

## 2013-09-22 MED ORDER — PENICILLIN V POTASSIUM 500 MG PO TABS: 500.0000 mg | ORAL_TABLET | Freq: Four times a day (QID) | ORAL | Status: AC

## 2013-09-22 MED ORDER — IBUPROFEN 800 MG PO TABS
800.0000 mg | ORAL_TABLET | Freq: Once | ORAL | Status: AC
  Administered 2013-09-22: 800 mg via ORAL
  Filled 2013-09-22: qty 1

## 2013-09-22 NOTE — Discharge Instructions (Signed)
Cerumen Impaction A cerumen impaction is when the wax in your ear forms a plug. This plug usually causes reduced hearing. Sometimes it also causes an earache or dizziness. Removing a cerumen impaction can be difficult and painful. The wax sticks to the ear canal. The canal is sensitive and bleeds easily. If you try to remove a heavy wax buildup with a cotton tipped swab, you may push it in further. Irrigation with water, suction, and small ear curettes may be used to clear out the wax. If the impaction is fixed to the skin in the ear canal, ear drops may be needed for a few days to loosen the wax. People who build up a lot of wax frequently can use ear wax removal products available in your local drugstore. SEEK MEDICAL CARE IF:  You develop an earache, increased hearing loss, or marked dizziness. Document Released: 07/01/2004 Document Revised: 08/16/2011 Document Reviewed: 08/21/2009 [facility] Patient Information 2014 [place], [place].  Dental Caries  Dental caries (also called tooth decay) is the most common oral disease. It can occur at any age, but is more common in children and young adults.  HOW DENTAL CARIES DEVELOPS  The process of decay begins when bacteria and foods (particularly sugars and starches) combine in your mouth to produce plaque. Plaque is a substance that sticks to the hard, outer surface of a tooth (enamel). The bacteria in plaque produce acids that attack enamel. These acids may also attack the root surface of a tooth (cementum) if it is exposed. Repeated attacks dissolve these surfaces and create holes in the tooth (cavities). If left untreated, the acids destroy the other layers of the tooth.  RISK FACTORS  Frequent sipping of sugary beverages.   Frequent snacking on sugary and starchy foods, especially those that easily get stuck in the teeth.   Poor oral hygiene.   Dry mouth.   Substance abuse such as methamphetamine abuse.   Broken or poor-fitting dental  restorations.   Eating disorders.   Gastroesophageal reflux disease (GERD).   Certain radiation treatments to the head and neck. SYMPTOMS In the early stages of dental caries, symptoms are seldom present. Sometimes white, chalky areas may be seen on the enamel or other tooth layers. In later stages, symptoms may include:  Pits and holes on the enamel.  Toothache after sweet, hot, or cold foods or drinks are consumed.  Pain around the tooth.  Swelling around the tooth. DIAGNOSIS  Most of the time, dental caries is detected during a regular dental checkup. A diagnosis is made after a thorough medical and dental history is taken and the surfaces of your teeth are checked for signs of dental caries. Sometimes special instruments, such as lasers, are used to check for dental caries. Dental X-ray exams may be taken so that areas not visible to the eye (such as between the contact areas of the teeth) can be checked for cavities.  TREATMENT  If dental caries is in its early stages, it may be reversed with a fluoride treatment or an application of a remineralizing agent at the dental office. Thorough brushing and flossing at home is needed to aid these treatments. If it is in its later stages, treatment depends on the location and extent of tooth destruction:   If a small area of the tooth has been destroyed, the destroyed area will be removed and cavities will be filled with a material such as gold, silver amalgam, or composite resin.   If a large area of the  tooth has been destroyed, the destroyed area will be removed and a cap (crown) will be fitted over the remaining tooth structure.   If the center part of the tooth (pulp) is affected, a procedure called a root canal will be needed before a filling or crown can be placed.   If most of the tooth has been destroyed, the tooth may need to be pulled (extracted). HOME CARE INSTRUCTIONS You can prevent, stop, or reverse dental caries at  home by practicing good oral hygiene. Good oral hygiene includes:  Thoroughly cleaning your teeth at least twice a day with a toothbrush and dental floss.   Using a fluoride toothpaste. A fluoride mouth rinse may also be used if recommended by your dentist or health care provider.   Restricting the amount of sugary and starchy foods and sugary liquids you consume.   Avoiding frequent snacking on these foods and sipping of these liquids.   Keeping regular visits with a dentist for checkups and cleanings. PREVENTION   Practice good oral hygiene.  Consider a dental sealant. A dental sealant is a coating material that is applied by your dentist to the pits and grooves of teeth. The sealant prevents food from being trapped in them. It may protect the teeth for several years.  Ask about fluoride supplements if you live in a community without fluorinated water or with water that has a low fluoride content. Use fluoride supplements as directed by your dentist or health care provider.  Allow fluoride varnish applications to teeth if directed by your dentist or health care provider. Document Released: 02/13/2002 Document Revised: 01/24/2013 Document Reviewed: 05/26/2012 [facility] Patient Information 2014 [place], [place].

## 2013-09-22 NOTE — ED Notes (Signed)
Pt standing in doorway asking where doctor is, security asked to assist with patient, pt immediately became verbal abusive, pt asked by this RN to please go back into the room, at that point she started saying she was just going to pee. I offered to assist her to the bathroom which she now refuses. Pt escorted back into room, door closed and calmly talked with patient, she verbally de-escalated, I listened and answered all questions. At the end of our conversation, I again explained delay of care, privacy issue with standing out in the hallway, pt was able to verbally return demonstration. Pt calm, cooperative, again offered to assist her to the bathroom and she again refused

## 2013-09-22 NOTE — ED Notes (Signed)
Pt yelling and screaming in the room, when I entered the room she was staggering, standing up and crying, when asked what was wrong she stated that " we are a sorry hospital and that she cant believe it takes 1 1/2 to get pain medications", I offered to talk with doctor about getting her some ibuprofen or torodal, she verbally started yelling and stating she needed a narcotic.

## 2013-09-22 NOTE — ED Notes (Signed)
Called to front desk to speak with patient, patient now verbally upset that we were not able to give her any "narcotics" for pain that we were an emergency room and that we should treat her pain. Again explained that per md assesment she did not feel that was necessary and that we would not be able to prescribe to her. Pt physically upset, again explained that we did treat her current symptoms, she has prescriptions to get filled that the md felt were prudent to her diagnosis at this time. At this time, pt continued to be disrespectful and verbally abusive, I asked her to calm down, she has been discharged and that if she was unable to be calm and cooperative while waiting for her ride, that I would be forced to call [facility]. Pt no longer said anything to this RN

## 2013-09-22 NOTE — ED Provider Notes (Signed)
CSN: [id number]     Arrival date & time 09/22/13  0124 History   First MD Initiated Contact with Patient 09/22/13 0259     Chief Complaint  Patient presents with  . Headache  . Dental Pain     (Consider location/radiation/quality/duration/timing/severity/associated sxs/prior Treatment) Patient is a 38 y.o. female presenting with headaches and tooth pain. The history is provided by the patient. The history is limited by the condition of the patient.  Headache Pain location:  Frontal Quality:  Dull Severity currently:  Unable to specify Severity at highest:  Unable to specify Onset quality:  Gradual Duration:  2 days Timing:  Constant Progression:  Unchanged Chronicity:  Recurrent Similar to prior headaches: yes   Context comment:  Brought on by dental pain Relieved by:  Nothing Worsened by:  Nothing tried Ineffective treatments:  None tried (denies narcotic usage) Associated symptoms: no numbness and no vomiting   Risk factors: no family hx of SAH   Dental Pain Location:  Lower Lower teeth location:  19/LL 1st molar and 18/LL 2nd molar Quality:  Dull Onset quality:  Gradual Duration:  2 days Timing:  Constant Progression:  Unchanged Chronicity:  New Context: dental caries   Associated symptoms: headaches   Risk factors: smoking   Also wants pap smear and full physical and has a myriad of other requests and then falls asleep mid thought  Past Medical History  Diagnosis Date  . Anxiety   . Herpes infection   . Depression   . Narcotic abuse   . Kidney stones   . Migraines    Past Surgical History  Procedure Laterality Date  . Arm surgery    . Tubal ligation     History reviewed. No pertinent family history. History  Substance Use Topics  . Smoking status: Current Some Day Smoker -- 0.50 packs/day    Types: Cigarettes  . Smokeless tobacco: Not on file  . Alcohol Use: No   OB History   Grav Para Term Preterm Abortions TAB SAB Ect Mult Living                 Review of Systems  HENT: Positive for dental problem.   Gastrointestinal: Negative for vomiting.  Neurological: Positive for headaches. Negative for weakness and numbness.  All other systems reviewed and are negative.     Allergies  Haldol  Home Medications   Prior to Admission medications   Medication Sig Start Date End Date Taking? Authorizing Provider  buprenorphine-naloxone (SUBOXONE) 8-2 MG SUBL SL tablet Place 1 tablet under the tongue daily.   Yes Historical Provider, MD  ALPRAZolam (XANAX) 0.25 MG tablet Take 0.25 mg by mouth 2 (two) times daily.      Historical Provider, MD  carbamide peroxide (DEBROX) 6.5 % otic solution Place 5 drops into both ears 2 (two) times daily. 09/22/13   Malcom Selmer K Tyce Delcid-Rasch, MD  cloNIDine (CATAPRES) 0.2 MG tablet Take 0.5 tablets (0.1 mg total) by mouth 2 (two) times daily. 03/19/13   Varney Biles, MD  escitalopram (LEXAPRO) 20 MG tablet Take 20 mg by mouth daily.      Historical Provider, MD  ibuprofen (ADVIL,MOTRIN) 600 MG tablet Take 1 tablet (600 mg total) by mouth every 6 (six) hours as needed for pain. 03/19/13   Varney Biles, MD  loperamide (IMODIUM) 2 MG capsule Take 1 capsule (2 mg total) by mouth 4 (four) times daily as needed for diarrhea or loose stools. 03/19/13   Varney Biles, MD  methadone (  DOLOPHINE) 10 MG/5ML solution Take by mouth every 6 (six) hours as needed for pain. 22m per day    Historical Provider, MD  penicillin v potassium (VEETID) 500 MG tablet Take 1 tablet (500 mg total) by mouth 4 (four) times daily. 09/22/13 09/29/13  Justine Cossin K Taher Vannote-Rasch, MD  promethazine (PHENERGAN) 25 MG tablet Take 1 tablet (25 mg total) by mouth every 6 (six) hours as needed for nausea. 05/01/11 05/08/11  MCharlena Cross MD  promethazine (PHENERGAN) 25 MG tablet Take 1 tablet (25 mg total) by mouth every 6 (six) hours as needed for nausea. 03/19/13   Ankit NKathrynn Humble MD   BP 124/78  Pulse 88  Temp(Src) 98.5 F (36.9 C) (Oral)  Resp 18   SpO2 100% Physical Exam  Constitutional: She appears well-developed and well-nourished. No distress.  HENT:  Head: Normocephalic and atraumatic.  Right Ear: No mastoid tenderness. No hemotympanum.  Left Ear: No mastoid tenderness. No hemotympanum.  Mouth/Throat: Oropharynx is clear and moist. No trismus in the jaw. No oropharyngeal exudate.    Cerumen in B ears L> R  Eyes: Conjunctivae and EOM are normal.  Pinpoint pupils  Neck: Normal range of motion. Neck supple.  Cardiovascular: Normal rate, regular rhythm and intact distal pulses.   Pulmonary/Chest: Effort normal and breath sounds normal. She has no wheezes. She has no rales.  Abdominal: Soft. Bowel sounds are normal. There is no tenderness. There is no rebound and no guarding.  Musculoskeletal: Normal range of motion. She exhibits no edema and no tenderness.  All compartments soft no cords no swelling   Lymphadenopathy:    She has no cervical adenopathy.  Neurological: She is alert. She has normal reflexes.  Skin: Skin is warm and dry.  Psychiatric: She has a normal mood and affect.    ED Course  Procedures (including critical care time) Labs Review Labs Reviewed - No data to display  Imaging Review No results found.   EKG Interpretation None      MDM   Final diagnoses:  Dental caries  Cerumen impaction   Nurse JAlmyra Freepresent during entirety of the exam and physical   Patient has pinpont pupils and cannot stay awake during exam.  It is clear she has taken some form of narcotics.  Patient is slurring speech and is under the influence of narcotic substance.  Moreover she has drug and alcohol services envelope on the stretcher.  Will will treat her caries with ibuprofen and penicillin and cerumen impaction with debrox.  She will need to follow up with a family physician for her full annual exam and pap smear as we do not do these in the ED and only treat medical emergencies and do not perform primary care.       ACarlisle Beers MD 09/22/13 [phone]

## 2013-09-22 NOTE — ED Notes (Signed)
Pt again walking around in room, opened door and asked for something to drink. Sprite provided.

## 2013-09-22 NOTE — ED Notes (Signed)
Patient now pacing back and forth in the room, unsteady gait, almost fell over the trash can. Again walked patient back to room, and re educated her that she has to stay in the room. Again asked patient if she would like to use bathroom, at which time she agreed. Pt independently ambulated to br with supervision. Walked her back to room and she sat on bed, closed door and said she had no other needs

## 2013-09-22 NOTE — ED Notes (Signed)
Patient walking in hall complaining she had been here for 8 hours. Patient appears to be intoxicated and not aware of her surroundings. Explained to patient that we were busy and would see her soon. Escorted patient back to her room. Patient not in any distress. RN and MD notified.

## 2013-09-22 NOTE — ED Notes (Signed)
Pt states she developed a migraine, toothache, pt states that it started 2 days ago, pt falling to sleep in triage, states she has had no sleep for 4 days due to pain in her leg, she cant be getting up and down, staggering back and forth

## 2013-09-22 NOTE — ED Notes (Signed)
MD at bedside. 

## 2013-09-22 NOTE — ED Notes (Signed)
Pt denies any substance abuse tonight or alcholol, pt obviously under the influence of some substance. Pt asked many times if she had taken any medications today but continues to deny

## 2013-09-22 NOTE — ED Notes (Signed)
Pt ambulated out to front desk with supervision, pt states that her mother is coming to pick her up. Security notified to monitor and ensure that she is picked up. Pt cooperative and calm at time of discharge

## 2013-12-19 ENCOUNTER — Telehealth (HOSPITAL_COMMUNITY): Payer: Self-pay | Admitting: *Deleted

## 2013-12-19 NOTE — Telephone Encounter (Signed)
Telephoned patient at home # and left message to return call to [facility] 

## 2014-01-18 ENCOUNTER — Encounter (HOSPITAL_COMMUNITY): Payer: Self-pay | Admitting: *Deleted

## 2014-01-18 DIAGNOSIS — N644 Mastodynia: Secondary | ICD-10-CM

## 2014-01-18 DIAGNOSIS — N6452 Nipple discharge: Secondary | ICD-10-CM

## 2014-01-18 DIAGNOSIS — N63 Unspecified lump in unspecified breast: Secondary | ICD-10-CM

## 2014-01-22 ENCOUNTER — Ambulatory Visit (HOSPITAL_COMMUNITY): Payer: Self-pay

## 2014-01-24 ENCOUNTER — Other Ambulatory Visit: Payer: Self-pay

## 2014-03-12 ENCOUNTER — Ambulatory Visit: Payer: Self-pay

## 2014-03-12 ENCOUNTER — Encounter (HOSPITAL_COMMUNITY): Payer: Self-pay

## 2014-03-12 ENCOUNTER — Ambulatory Visit (HOSPITAL_COMMUNITY)
Admission: RE | Admit: 2014-03-12 | Discharge: 2014-03-12 | Disposition: A | Discharge status: Home / Self Care | Payer: Self-pay | Source: Ambulatory Visit | Attending: Obstetrics and Gynecology | Admitting: Obstetrics and Gynecology

## 2014-03-12 VITALS — BP 100/64 | Temp 98.6°F | Ht 63.0 in | Wt 176.6 lb

## 2014-03-12 DIAGNOSIS — N6452 Nipple discharge: Secondary | ICD-10-CM

## 2014-03-12 DIAGNOSIS — N6311 Unspecified lump in the right breast, upper outer quadrant: Secondary | ICD-10-CM | POA: Insufficient documentation

## 2014-03-12 DIAGNOSIS — R87810 Cervical high risk human papillomavirus (HPV) DNA test positive: Secondary | ICD-10-CM | POA: Insufficient documentation

## 2014-03-12 DIAGNOSIS — Z01419 Encounter for gynecological examination (general) (routine) without abnormal findings: Secondary | ICD-10-CM

## 2014-03-12 HISTORY — DX: Unspecified viral hepatitis C without hepatic coma: B19.20

## 2014-03-12 NOTE — Addendum Note (Signed)
Encounter addended by: Priscille Heidelberghristine P Cedric Denison, RN on: 03/12/2014  2:35 PM<BR>     Documentation filed: Patient Instructions Section

## 2014-03-12 NOTE — Patient Instructions (Addendum)
Explained to Nancy AbtsSusan Leopard that [facility] will cover Pap smears and co-testing every 5 years unless has a history of abnormal Pap smears. [facility]. Appointment scheduled for Tuesday, March 12, 2014 at 1445. Patient aware of appointment and will be there. Let patient know will follow up with her within the next couple weeks with results of Pap smear and breast discharge by phone. Smoking cessation discussed with patient and resources given.  Nancy AbtsSusan Huffine erbalized understanding.

## 2014-03-12 NOTE — Progress Notes (Signed)
Complaints of a right breast lump x 3 months that comes and goes. Complaints of bilateral milky nipple discharge and pain. Patient rated pain at a 5 out of 10.  Pap Smear:  Pap smear completed today. Patients last Pap smear was 5 years ago and normal per patient. Per patient has no history of an abnormal Pap smear. No Pap smear results are in EPIC.  Physical exam: Breasts Breasts symmetrical. No skin abnormalities bilateral breasts. No nipple retraction bilateral breasts. Bilateral milky colored nipple discharge when expressed. Samples of the nipple discharge in each breast has been sent to the lab. No lymphadenopathy. Palpated a pea sized lump within the right breast at 10 o'clock 5 cm from the nipple. Complaints of right outer breast tenderness on exam where palpated lump and surrounding area. No Pap smear results are in EPIC.     Pelvic/Bimanual   Ext Genitalia No lesions, no swelling and no discharge observed on external genitalia.         Vagina Vagina pink and normal texture. No lesions or discharge observed in vagina.          Cervix Cervix is present. Cervix pink and of normal texture. No discharge observed.     Uterus Uterus is present and palpable. Uterus in normal position and normal size.        Adnexae Bilateral ovaries present and palpable. No tenderness on palpation.          Rectovaginal No rectal exam completed today since patient had no rectal complaints. No skin abnormalities observed on exam.      Smoking cessation discussed with patient and resources given.

## 2014-03-12 NOTE — Addendum Note (Signed)
Encounter addended by: Lurlean HornsSabrina Taytum Scheck Kollyns Mickelson, LPN on: 16/1/096010/11/2013  3:46 PM<BR>     Documentation filed: Visit Diagnoses, Orders

## 2014-03-13 LAB — CYTOLOGY - PAP

## 2014-03-15 ENCOUNTER — Encounter: Payer: Self-pay | Admitting: Obstetrics and Gynecology

## 2014-03-15 DIAGNOSIS — IMO0002 Reserved for concepts with insufficient information to code with codable children: Secondary | ICD-10-CM | POA: Insufficient documentation

## 2014-03-19 ENCOUNTER — Other Ambulatory Visit: Payer: Self-pay

## 2014-03-21 ENCOUNTER — Other Ambulatory Visit: Payer: Self-pay

## 2014-03-27 ENCOUNTER — Telehealth (HOSPITAL_COMMUNITY): Payer: Self-pay | Admitting: *Deleted

## 2014-03-27 NOTE — Telephone Encounter (Signed)
Telephoned patient at home discussed negative pap smear results. 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 Patient voiced understanding.

## 2014-04-08 ENCOUNTER — Other Ambulatory Visit: Payer: Self-pay

## 2014-04-08 ENCOUNTER — Encounter (HOSPITAL_COMMUNITY): Payer: Self-pay

## 2014-04-24 ENCOUNTER — Other Ambulatory Visit: Payer: Self-pay

## 2014-06-03 ENCOUNTER — Emergency Department (HOSPITAL_COMMUNITY): Payer: Self-pay

## 2014-06-03 ENCOUNTER — Emergency Department (HOSPITAL_COMMUNITY)
Admission: EM | Admit: 2014-06-03 | Discharge: 2014-06-03 | Disposition: A | Discharge status: Home / Self Care | Payer: Self-pay | Attending: Emergency Medicine | Admitting: Emergency Medicine

## 2014-06-03 ENCOUNTER — Encounter (HOSPITAL_COMMUNITY): Payer: Self-pay

## 2014-06-03 DIAGNOSIS — Z87442 Personal history of urinary calculi: Secondary | ICD-10-CM | POA: Insufficient documentation

## 2014-06-03 DIAGNOSIS — Z8619 Personal history of other infectious and parasitic diseases: Secondary | ICD-10-CM | POA: Insufficient documentation

## 2014-06-03 DIAGNOSIS — Z79899 Other long term (current) drug therapy: Secondary | ICD-10-CM | POA: Insufficient documentation

## 2014-06-03 DIAGNOSIS — F419 Anxiety disorder, unspecified: Secondary | ICD-10-CM | POA: Insufficient documentation

## 2014-06-03 DIAGNOSIS — R109 Unspecified abdominal pain: Secondary | ICD-10-CM

## 2014-06-03 DIAGNOSIS — Z720 Tobacco use: Secondary | ICD-10-CM | POA: Insufficient documentation

## 2014-06-03 DIAGNOSIS — G43909 Migraine, unspecified, not intractable, without status migrainosus: Secondary | ICD-10-CM | POA: Insufficient documentation

## 2014-06-03 DIAGNOSIS — F329 Major depressive disorder, single episode, unspecified: Secondary | ICD-10-CM | POA: Insufficient documentation

## 2014-06-03 DIAGNOSIS — N12 Tubulo-interstitial nephritis, not specified as acute or chronic: Secondary | ICD-10-CM | POA: Insufficient documentation

## 2014-06-03 DIAGNOSIS — R51 Headache: Secondary | ICD-10-CM

## 2014-06-03 DIAGNOSIS — R519 Headache, unspecified: Secondary | ICD-10-CM

## 2014-06-03 DIAGNOSIS — K029 Dental caries, unspecified: Secondary | ICD-10-CM | POA: Insufficient documentation

## 2014-06-03 LAB — CBC WITH DIFFERENTIAL/PLATELET
BASOS ABS: 0 10*3/uL (ref 0.0–0.1)
BASOS PCT: 0 % (ref 0–1)
EOS PCT: 2 % (ref 0–5)
Eosinophils Absolute: 0.2 10*3/uL (ref 0.0–0.7)
HEMATOCRIT: 44.4 % (ref 36.0–46.0)
Hemoglobin: 14.5 g/dL (ref 12.0–15.0)
Lymphocytes Relative: 35 % (ref 12–46)
Lymphs Abs: 3.3 10*3/uL (ref 0.7–4.0)
MCH: 28.2 pg (ref 26.0–34.0)
MCHC: 32.7 g/dL (ref 30.0–36.0)
MCV: 86.2 fL (ref 78.0–100.0)
MONO ABS: 0.8 10*3/uL (ref 0.1–1.0)
Monocytes Relative: 9 % (ref 3–12)
NEUTROS ABS: 5.3 10*3/uL (ref 1.7–7.7)
Neutrophils Relative %: 54 % (ref 43–77)
Platelets: 346 10*3/uL (ref 150–400)
RBC: 5.15 MIL/uL — ABNORMAL HIGH (ref 3.87–5.11)
RDW: 15.1 % (ref 11.5–15.5)
WBC: 9.6 10*3/uL (ref 4.0–10.5)

## 2014-06-03 LAB — URINALYSIS, ROUTINE W REFLEX MICROSCOPIC
GLUCOSE, UA: NEGATIVE mg/dL
Ketones, ur: NEGATIVE mg/dL
LEUKOCYTES UA: NEGATIVE
Nitrite: NEGATIVE
PH: 5.5 (ref 5.0–8.0)
PROTEIN: NEGATIVE mg/dL
SPECIFIC GRAVITY, URINE: 1.031 — AB (ref 1.005–1.030)
Urobilinogen, UA: 1 mg/dL (ref 0.0–1.0)

## 2014-06-03 LAB — I-STAT CHEM 8, ED
BUN: 10 mg/dL (ref 6–23)
CALCIUM ION: 1.17 mmol/L (ref 1.12–1.23)
CHLORIDE: 105 meq/L (ref 96–112)
Creatinine, Ser: 0.9 mg/dL (ref 0.50–1.10)
GLUCOSE: 78 mg/dL (ref 70–99)
HEMATOCRIT: 48 % — AB (ref 36.0–46.0)
Hemoglobin: 16.3 g/dL — ABNORMAL HIGH (ref 12.0–15.0)
Potassium: 3.5 mmol/L (ref 3.5–5.1)
Sodium: 140 mmol/L (ref 135–145)
TCO2: 20 mmol/L (ref 0–100)

## 2014-06-03 LAB — URINE MICROSCOPIC-ADD ON

## 2014-06-03 LAB — BASIC METABOLIC PANEL
ANION GAP: 9 (ref 5–15)
BUN: 11 mg/dL (ref 6–23)
CALCIUM: 9.2 mg/dL (ref 8.4–10.5)
CO2: 22 mmol/L (ref 19–32)
CREATININE: 0.9 mg/dL (ref 0.50–1.10)
Chloride: 105 mEq/L (ref 96–112)
GFR calc non Af Amer: 79 mL/min — ABNORMAL LOW (ref >90)
Glucose, Bld: 76 mg/dL (ref 70–99)
Potassium: 3.3 mmol/L — ABNORMAL LOW (ref 3.5–5.1)
Sodium: 136 mmol/L (ref 135–145)

## 2014-06-03 MED ORDER — CEPHALEXIN 500 MG PO CAPS: 500.0000 mg | ORAL_CAPSULE | Freq: Four times a day (QID) | ORAL | Status: DC

## 2014-06-03 MED ORDER — HYDROMORPHONE HCL 1 MG/ML IJ SOLN
1.0000 mg | Freq: Once | INTRAMUSCULAR | Status: AC
  Administered 2014-06-03: 1 mg via INTRAMUSCULAR
  Filled 2014-06-03: qty 1

## 2014-06-03 MED ORDER — OXYCODONE-ACETAMINOPHEN 5-325 MG PO TABS: ORAL_TABLET | ORAL | Status: DC

## 2014-06-03 MED ORDER — MORPHINE SULFATE 4 MG/ML IJ SOLN
4.0000 mg | Freq: Once | INTRAMUSCULAR | Status: AC
  Administered 2014-06-03: 4 mg via INTRAMUSCULAR

## 2014-06-03 MED ORDER — MORPHINE SULFATE 4 MG/ML IJ SOLN
4.0000 mg | INTRAMUSCULAR | Status: DC | PRN
  Filled 2014-06-03: qty 1

## 2014-06-03 MED ORDER — METOCLOPRAMIDE HCL 5 MG/ML IJ SOLN
10.0000 mg | Freq: Once | INTRAMUSCULAR | Status: DC
  Filled 2014-06-03: qty 2

## 2014-06-03 MED ORDER — CEFTRIAXONE SODIUM 1 G IJ SOLR
1.0000 g | Freq: Once | INTRAMUSCULAR | Status: AC
  Administered 2014-06-03: 1 g via INTRAMUSCULAR
  Filled 2014-06-03: qty 10

## 2014-06-03 MED ORDER — DIPHENHYDRAMINE HCL 50 MG/ML IJ SOLN
25.0000 mg | Freq: Once | INTRAMUSCULAR | Status: DC
  Filled 2014-06-03: qty 1

## 2014-06-03 NOTE — Discharge Instructions (Signed)
Take percocet for breakthrough pain, do not drink alcohol, drive, care for children or do other critical tasks while taking percocet.   Take your antibiotics as directed and to completion. You should never have any leftover antibiotics! Push fluids and stay well hydrated.   Any antibiotic use can reduce the efficacy of hormonal birth control. Please use back up method of contraception.   Do not hesitate to return to the emergency room for any new, worsening or concerning symptoms.  Please obtain primary care using resource guide below. But the minute you were seen in the emergency room and that they will need to obtain records for further outpatient management.   Emergency Department Resource Guide 1) Find a Doctor and Pay Out of Pocket Although you won't have to find out who is covered by your insurance plan, it is a good idea to ask around and get recommendations. You will then need to call the office and see if the doctor you have chosen will accept you as a new patient and what types of options they offer for patients who are self-pay. Some doctors offer discounts or will set up payment plans for their patients who do not have insurance, but you will need to ask so you aren't surprised when you get to your appointment.  2) Contact Your Local Health Department Not all health departments have doctors that can see patients for sick visits, but many do, so it is worth a call to see if yours does. If you don't know where your local health department is, you can check in your phone book. The CDC also has a tool to help you locate your state's health department, and many state websites also have listings of all of their local health departments.  3) Find a Walk-in Clinic If your illness is not likely to be very severe or complicated, you may want to try a walk in clinic. These are popping up all over the country in pharmacies, drugstores, and shopping centers. They're usually staffed by nurse  practitioners or physician assistants that have been trained to treat common illnesses and complaints. They're usually fairly quick and inexpensive. However, if you have serious medical issues or chronic medical problems, these are probably not your best option.  No Primary Care Doctor: - Call Health Connect at  [phone] - they can help you locate a primary care doctor that  accepts your insurance, provides certain services, etc. - Physician Referral Service- [phone]  Chronic Pain Problems: Organization         Address  Phone   Notes  [facility]  [phone]) [phone] Patients need to be referred by their primary care doctor.   Medication Assistance: Organization         Address  Phone   Notes  [facility] Medication [facility] Program [address] [place], [place] [address] [phone]) [phone] --Must be a resident of [facility] [place] -- Must have NO insurance coverage whatsoever (no Medicaid/ Medicare, etc.) -- The pt. MUST have a primary care doctor that directs their care regularly and follows them in the community   MedAssist  [phone]   [employer]  [phone]) [phone]    Agencies that provide inexpensive medical care: Organization         Address  Phone   Notes  [facility]  [phone]) [phone]   [facility]    [phone]) [phone]   [facility] [address]  [place], [place] [address] [phone]) [phone]   [facility] [address], [place] [phone](336) [phone]   Planned Parenthood    [phone]) [phone]   [facility]    [phone]) [phone]   [facility]  [address], [place] Phone:  [phone](336) [phone], Fax:  [phone]) [phone] Hours of Operation:  9 am - 6 pm, M-F.  Also accepts Medicaid/Medicare and self-pay.  [facility]  [address], [place] Phone: [phone]) [phone], Fax: [phone]) [phone]. Hours of Operation:  8:30 am -  5:30 pm, M-F.  Also accepts Medicaid and self-pay.  [facility] [place] [address], [place] Phone: [phone]) [phone]   [facility] [address], [place], [place] [phone], Ext. [phone] Mondays & Thursdays: 7-9 AM.  First 15 patients are seen on a first come, first serve basis.    Medicaid-accepting [facility] Providers:  Organization         Address  Phone   Notes  [facility] [address], [place] [phone]) [phone] Also accepts self-pay patients.  [facility] [address], [place]  [phone](336) [phone]   [facility] [address], [place] [phone](336) [phone]   [facility] [address] [place] [address], [place] [phone]) [phone]   Renaye RakersVeita Bland [address], [place]   [phone]) [phone] Only accepts [place] Access [place] patients after they have their name applied to their card.   Self-Pay (no insurance) in [facility] [place]:  Organization         Address  Phone   Notes  Sickle Cell Patients, [facility] [address], [place] [phone]) [phone]   [facility] [address], [place] [phone]) [phone]   [facility] [place]  [address], [place] [phone](336) [phone]   [facility]/Dr. Osei-Bonsu  [address] [place] [address], [place] or [phone], [place] [phone]) [phone] Phone number for both [place] and [place] locations is the same.  [facility] and [facility] [address], [place] [phone]) [phone]   [facility]  [address] [place] [address], [place] or [address] [phone]) [phone]   [facility] [address], [place] [phone](336) [phone], phone; [phone](336) [phone], fax Sees patients 1st and 3rd Saturday of every month.  Must not qualify for public or private insurance (i.e. Medicaid, Medicare, [place] Health Choice,  Veterans' Benefits)  Household income should be no more than 200% of the poverty level The clinic cannot treat you if you are pregnant or think you are pregnant  Sexually transmitted diseases are not treated at the clinic.    Dental Care: Organization         Address  Phone  Notes  [facility] Department of [facility] [address], [place] [phone]) [phone] Accepts children up to age 40 who are enrolled in [place]icaid or [place] Health Choice; pregnant women with a Medicaid card; and children who have applied for Medicaid or [place] Health Choice, but were declined, whose parents can pay a reduced fee at time of service.  [facility] Department of [facility] [place]  [address], [place] [phone]) [phone] Accepts children up to age 40 who are enrolled in [place]icaid or [place] Health Choice;  pregnant women with a Medicaid card; and children who have applied for Medicaid or [place] Health Choice, but were declined, whose parents can pay a reduced fee at time of service.  [facility]  [address], [place] [phone]) [phone] Patients are seen by appointment only. Walk-ins are not accepted. [facility] will see patients 40 years of age and older. Monday - Tuesday (8am-5pm) Most Wednesdays (8:30-5pm) $30 per visit, cash only  [facility]  [address], [facility] [place] [phone] Patients are seen by appointment only. Walk-ins are not accepted. [facility] will see patients 40 years of age and older. One Wednesday Evening (Monthly: Volunteer Based).  $30 per visit, cash only  [employer] [facility] [facility]  [phone] for adults; Children under age 494, call Graduate Pediatric Dentistry at [phone]) [phone]. Children aged 254-14, please call [phone] to request a pediatric application.  Dental services are provided in all areas of dental care including fillings, crowns and bridges, complete and  partial dentures, implants, gum treatment, root canals, and extractions. Preventive care is also provided. Treatment is provided to both adults and children. Patients are selected via a lottery and there is often a waiting list.   [facility] [address], [place]  [phone]) [phone] [email or web address]   [facility] [address], [place], [place] [phone], Ext. [phone] Second and Fourth Thursday of each month, opens at 6:30 AM; Clinic ends at 9 AM.  Patients are seen on a first-come first-served basis, and a limited number are seen during each clinic.   [facility]  [address], [place], [place] [phone](336) [phone]   Eligibility Requirements You must have lived in [place], [place], or [place] counties for at least the last three months.   You cannot be eligible for state or federal sponsored National Cityhealthcare insurance, including CIGNAVeterans Administration, [place]icaid, or [employer] EntertainmentMedicare.   You generally cannot be eligible for healthcare insurance through your employer.    How to apply: Eligibility screenings are held every Tuesday and Wednesday afternoon from 1:00 pm until 4:00 pm. You do not need an appointment for the interview!  [facility] [address], [place], [place] [phone]   [facility]  [phone]   [facility]  [phone]   [facility]  [phone]    Behavioral Health Resources in the Community: Intensive Outpatient Programs Organization         Address  Phone  Notes  [facility] [address], [place], [place] [phone]   [facility] [address], [place], [place] [phone]   [facility] [address], [place], [place]  [phone]   [facility] [address],  [place], [place] [phone] or [phone]   Substance Abuse Resources Organization          Address  Phone  Notes  [facility]  [phone]   [facility]  [phone]   [facility]  [phone]   [facility]  [phone]   Residential & Outpatient Substance Abuse Program  [phone]   Psychological Services Organization         Address  Phone  Notes  [facility]  [phone]- [phone]   [facility]  [phone]- [phone]   [facility] [address], [place] [phone] or [phone]    Mobile  Crisis Teams Organization         Address  Phone  Notes  [facility], Mobile Crisis Care Unit  [phone]   [facility]  [address] [place], [place] [phone]   [facility] DeEsch [address] [place] [phone]    Self-Help/Support Groups Organization         Address  Phone             Notes  Mental Health Assoc. of [place] - variety of support groups  336- [phone] Call for more information  Narcotics Anonymous (NA), Caring [facility] [address], [place]  2 meetings at this location   Statisticianesidential Treatment Programs Organization         Address  Phone  Notes  [facility] [address],    [place]  [phone]   [facility]  [address], [place] [phone], [place], [place] [phone]   [facility] [address], [place] [phone] Admissions: 8am-3pm M-F  [facility] [address]    [place], [place] [phone]   [facility] [address], [place], [place] [phone]   [facility] [address]  [place], [place] [phone]   [facility] - Intensive Outpatient [address], [place], [place] [phone]   [facility] ([facility] [address]  [place], [place] [phone] or [phone]   [facility] ([facility]) [address] [place], [place]  [phone] Accepts Medicaid  [facility] [address]  [place] [phone] Substance Abuse/Addiction Treatment   [facility] Behavioral Health Resources Organization         Address  Phone  Notes  [facility]  [phone]   Angie FavaJulie Brannon, PhD [address], [address] [place], [place]   [phone](336) [phone] or [phone]) [phone]   [facility]   [address] [place], [place] [phone]) [phone]   [facility] [address], [place], [place] [phone](336) [phone] Insurance/Medicaid/sponsorship through [facility]  [facility] Families [address]                                    [place], [place] [phone](336) [phone] Therapy/tele-psych/case  [facility] [address].   [place], [place] [phone](336) [phone]    Dr. Lolly MustacheArfeen  [phone](336) [phone]   [facility] [place]  [employer] [facility] 1) [address], [place] 2) [address], [place] 3)  [address], [place] [phone]) [phone](336) [phone]  [phone](336) [phone]   [facility] Child Abuse Hotline [phone](336) [phone] or [phone](336) [phone] (After Hours)

## 2014-06-03 NOTE — ED Notes (Addendum)
Per pt, migraine headache since 12/24.  No relief.  Also c/o back pain.  Hx of stones and feels the same.  Burning with urination

## 2014-06-03 NOTE — ED Provider Notes (Signed)
CSN: [id number]     Arrival date & time 06/03/14  1254 History  This chart was scribed for non-physician practitioner, Wynetta Emery, PA-C, working with Suzi Roots, MD, by Ronney Lion, ED Scribe. This patient was seen in room WTR8/WTR8 and the patient's care was started at 3:02 PM.    Chief Complaint  Patient presents with  . Headache  . Back Pain   The history is provided by the patient. No language interpreter was used.     HPI Comments: Nancy Terry is a 38 y.o. female with a history of kidney stones who presents to the Emergency Department complaining of bilateral flank pain radiating to the front abdomen, worse on the right side, and a constant, unchanged, throbbing headache that began 3 days ago. Patient states that her symptoms are typical when passing past kidney stones, and of previous migraine headaches. She complains of associated burning dysuria, nausea, loss of appetite, and subjective fever and chills. She also complains of neck pain, blurry vision, photophobia, and phonophobia. She reports a history of stent and lithotripsy to pass previous stones. Patient has tried hot showers, BC powder, and Topomax for her headache with no relief. Patient states she was diagnosed with Hepatitis C a couple months ago. Dr. Salvatore Decent, in [place], [place], is her urologist. She has a known allergy to Haldol.    Past Medical History  Diagnosis Date  . Anxiety   . Herpes infection   . Depression   . Narcotic abuse   . Kidney stones   . Migraines   . Hepatitis C    Past Surgical History  Procedure Laterality Date  . Arm surgery    . Tubal ligation    . Left elbow surgery      Family History  Problem Relation Age of Onset  . Parkinson's disease Mother   . Hypertension Mother   . Parkinson's disease Maternal Grandmother   . Hypertension Maternal Grandmother   . Cancer Maternal Grandmother     lung  . Glaucoma Maternal Grandmother    History  Substance Use Topics  . Smoking status:  Current Some Day Smoker -- 2.00 packs/day for 3 years    Types: Cigarettes  . Smokeless tobacco: Never Used  . Alcohol Use: No   OB History    Gravida Para Term Preterm AB TAB SAB Ectopic Multiple Living   3 3 3       3      Review of Systems  A complete 10 system review of systems was obtained and all systems are negative except as noted in the HPI and PMH.    Allergies  Haldol  Home Medications   Prior to Admission medications   Medication Sig Start Date End Date Taking? Authorizing Provider  topiramate (TOPAMAX) 50 MG tablet Take 50 mg by mouth 2 (two) times daily.   Yes Historical Provider, MD  carbamide peroxide (DEBROX) 6.5 % otic solution Place 5 drops into both ears 2 (two) times daily. Patient not taking: Reported on 06/03/2014 09/22/13   April K Palumbo-Rasch, MD  cephALEXin (KEFLEX) 500 MG capsule Take 1 capsule (500 mg total) by mouth 4 (four) times daily. 06/03/14   Tryone Kille, PA-C  cloNIDine (CATAPRES) 0.2 MG tablet Take 0.5 tablets (0.1 mg total) by mouth 2 (two) times daily. Patient not taking: Reported on 06/03/2014 03/19/13   Derwood Kaplan, MD  ibuprofen (ADVIL,MOTRIN) 600 MG tablet Take 1 tablet (600 mg total) by mouth every 6 (six) hours as needed for  pain. Patient not taking: Reported on 06/03/2014 03/19/13   Derwood Kaplan, MD  loperamide (IMODIUM) 2 MG capsule Take 1 capsule (2 mg total) by mouth 4 (four) times daily as needed for diarrhea or loose stools. Patient not taking: Reported on 06/03/2014 03/19/13   Derwood Kaplan, MD  oxyCODONE-acetaminophen (PERCOCET/ROXICET) 5-325 MG per tablet 1 to 2 tabs PO q6hrs  PRN for pain 06/03/14   Joni Reining Charlesetta Milliron, PA-C  promethazine (PHENERGAN) 25 MG tablet Take 1 tablet (25 mg total) by mouth every 6 (six) hours as needed for nausea. Patient not taking: Reported on 06/03/2014 05/01/11 05/08/11  Felisa Bonier, MD  promethazine (PHENERGAN) 25 MG tablet Take 1 tablet (25 mg total) by mouth every 6 (six) hours as  needed for nausea. Patient not taking: Reported on 06/03/2014 03/19/13   Derwood Kaplan, MD   BP 125/68 mmHg  Pulse 71  Temp(Src) 98.1 F (36.7 C) (Oral)  Resp 18  SpO2 100%  LMP 05/09/2014 Physical Exam  Constitutional: She is oriented to person, place, and time. She appears well-developed and well-nourished. No distress.  HENT:  Head: Normocephalic and atraumatic.  Mouth/Throat: Oropharynx is clear and moist.  Multiple dental caries  Eyes: Conjunctivae and EOM are normal. Pupils are equal, round, and reactive to light.  Neck: Normal range of motion. Neck supple. No tracheal deviation present.  II-Visual fields grossly intact. III/IV/VI-Extraocular movements intact.  Pupils reactive bilaterally. V/VII-Smile symmetric, equal eyebrow raise,  facial sensation intact VIII- Hearing grossly intact IX/X-Normal gag XI-bilateral shoulder shrug XII-midline tongue extension Motor: 5/5 bilaterally with normal tone and bulk Cerebellar: Normal finger-to-nose  and normal heel-to-shin test.   Romberg negative Ambulates with a coordinated gait   Cardiovascular: Normal rate, regular rhythm and intact distal pulses.   Pulmonary/Chest: Effort normal and breath sounds normal. No respiratory distress. She has no wheezes. She has no rales. She exhibits no tenderness.  Abdominal: Soft. Bowel sounds are normal. She exhibits no distension and no mass. There is no tenderness. There is no rebound and no guarding.  Genitourinary:  Mild bilateral CVA tenderness palpation.  Musculoskeletal: Normal range of motion.  Neurological: She is alert and oriented to person, place, and time.  Skin: Skin is warm and dry.  Psychiatric: She has a normal mood and affect. Her behavior is normal.  Nursing note and vitals reviewed.   ED Course  Procedures (including critical care time)  DIAGNOSTIC STUDIES: Oxygen Saturation is 100% on room air, normal by my interpretation.    COORDINATION OF CARE: 3:05 PM -  Discussed treatment plan with pt at bedside which includes IV medication and CT abdomen and pt agreed to plan.   Labs Review Labs Reviewed  URINALYSIS, ROUTINE W REFLEX MICROSCOPIC - Abnormal; Notable for the following:    Color, Urine AMBER (*)    Specific Gravity, Urine 1.031 (*)    Hgb urine dipstick SMALL (*)    Bilirubin Urine SMALL (*)    All other components within normal limits  URINE MICROSCOPIC-ADD ON - Abnormal; Notable for the following:    Squamous Epithelial / LPF MANY (*)    Bacteria, UA MANY (*)    All other components within normal limits  CBC WITH DIFFERENTIAL - Abnormal; Notable for the following:    RBC 5.15 (*)    All other components within normal limits  BASIC METABOLIC PANEL - Abnormal; Notable for the following:    Potassium 3.3 (*)    GFR calc non Af Amer 79 (*)  All other components within normal limits  I-STAT CHEM 8, ED - Abnormal; Notable for the following:    Hemoglobin 16.3 (*)    HCT 48.0 (*)    All other components within normal limits  URINE CULTURE    Imaging Review Ct Renal Stone Study  06/03/2014   CLINICAL DATA:  Bilateral flank pain for 3 days. Fever, nausea vomiting.  EXAM: CT ABDOMEN AND PELVIS WITHOUT CONTRAST  TECHNIQUE: Multidetector CT imaging of the abdomen and pelvis was performed following the standard protocol without IV contrast.  COMPARISON:  02/26/2010  FINDINGS: Visualization of the lower thorax demonstrates no consolidative or nodular pulmonary opacities. Normal heart size.  Lack of intravenous contrast material limits evaluation of the solid organ parenchyma.  The non contrast-enhanced liver, gallbladder, pancreas and spleen are unremarkable. Sub cm low-attenuation lesion right hepatic lobe (image 30; series 2), too small to characterize. Normal bilateral adrenal glands.  9 mm low-attenuation lesion interpolar region right kidney, too small accurately characterize. 1.4 cm probable cyst interpolar region left kidney. No  nephroureterolithiasis. No hydronephrosis.  Normal caliber abdominal aorta. Multiple sub cm retroperitoneal lymph nodes are stable when compared to prior evaluation. Urinary bladder is unremarkable. Uterus is unremarkable. Bilateral ovaries are unremarkable.  Stool is present throughout the colon. No abnormal bowel wall thickening or evidence for bowel obstruction. No free fluid or free intraperitoneal air. Possible small diverticulum off of the distal colon, stable from prior. Normal appendix.  No aggressive or acute appearing osseous lesions.  IMPRESSION: No nephroureterolithiasis.  No hydronephrosis.  Normal appendix.   Electronically Signed   By: Annia Beltrew  Davis M.D.   On: 06/03/2014 17:04     EKG Interpretation None      MDM   Final diagnoses:  Bilateral flank pain  Acute nonintractable headache, unspecified headache type  Pyelonephritis   Filed Vitals:   06/03/14 1319 06/03/14 1835  BP: 118/72 125/68  Pulse: 63 71  Temp: 98 F (36.7 C) 98.1 F (36.7 C)  TempSrc: Oral Oral  Resp: 20 18  SpO2: 100% 100%    Medications  metoCLOPramide (REGLAN) injection 10 mg (10 mg Intravenous Not Given 06/03/14 1700)  diphenhydrAMINE (BENADRYL) injection 25 mg (25 mg Intravenous Not Given 06/03/14 1700)  morphine 4 MG/ML injection 4 mg (not administered)  morphine 4 MG/ML injection 4 mg (4 mg Intramuscular Given 06/03/14 1627)  cefTRIAXone (ROCEPHIN) injection 1 g (1 g Intramuscular Given 06/03/14 1816)  HYDROmorphone (DILAUDID) injection 1 mg (1 mg Intramuscular Given 06/03/14 1817)    Nancy Terry is a pleasant 38 y.o. female presenting with exacerbation of bilateral flank pain worse on the right over the course of last 4 days. Extensive history of nephrolithiasis. States this feels similar.  Patient is afebrile and well-appearing, appears well-hydrated.   Difficulty obtaining an IV, patient will be given IM morphine.   CT shows no nephrolithiasis.  Neuro exam is nonfocal, headache is  typical for her priors, I doubt this is a SAH, CVA, meningitis, urinalysis is contaminated but has many bacteria. Patient will be given Rocephin in the ED, sent home with Keflex and urine cultures pending. I have referred her out her gastroenterologist for management of her hepatitis.  Evaluation does not show pathology that would require ongoing emergent intervention or inpatient treatment. Pt is hemodynamically stable and mentating appropriately. Discussed findings and plan with patient/guardian, who agrees with care plan. All questions answered. Return precautions discussed and outpatient follow up given.   Discharge Medication List as of 06/03/2014  6:00 PM    START taking these medications   Details  cephALEXin (KEFLEX) 500 MG capsule Take 1 capsule (500 mg total) by mouth 4 (four) times daily., Starting 06/03/2014, Until Discontinued, Print    oxyCODONE-acetaminophen (PERCOCET/ROXICET) 5-325 MG per tablet 1 to 2 tabs PO q6hrs  PRN for pain, Print         I personally performed the services described in this documentation, which was scribed in my presence. The recorded information has been reviewed and is accurate.   Wynetta Emeryicole Garlan Drewes, PA-C 06/03/14 1845  Elwin MochaBlair Walden, MD 06/04/14 [phone]

## 2014-06-04 LAB — URINE CULTURE
Colony Count: NO GROWTH
Culture: NO GROWTH

## 2014-06-06 ENCOUNTER — Ambulatory Visit: Payer: Self-pay

## 2014-06-14 ENCOUNTER — Ambulatory Visit (HOSPITAL_BASED_OUTPATIENT_CLINIC_OR_DEPARTMENT_OTHER): Payer: Self-pay

## 2014-06-14 ENCOUNTER — Other Ambulatory Visit: Payer: Self-pay

## 2014-06-14 VITALS — BP 136/80 | HR 54 | Temp 98.6°F | Resp 14 | Ht 63.75 in | Wt 181.3 lb

## 2014-06-14 DIAGNOSIS — Z0000 Encounter for general adult medical examination without abnormal findings: Secondary | ICD-10-CM

## 2014-06-14 LAB — LIPID PANEL
CHOL/HDL RATIO: 4.9 ratio
Cholesterol: 128 mg/dL (ref 0–200)
HDL: 26 mg/dL — ABNORMAL LOW (ref >39)
LDL CALC: 71 mg/dL (ref 0–99)
Triglycerides: 157 mg/dL — ABNORMAL HIGH (ref <150)
VLDL: 31 mg/dL (ref 0–40)

## 2014-06-14 LAB — HEMOGLOBIN A1C
HEMOGLOBIN A1C: 5.3 % (ref <5.7)
MEAN PLASMA GLUCOSE: 105 mg/dL (ref <117)

## 2014-06-14 LAB — GLUCOSE (CC13): Glucose: 95 mg/dl (ref 70–140)

## 2014-06-14 NOTE — Patient Instructions (Signed)
Discussed health assessment with patient. She will be called with results of lab work and we will then discussed any further follow up the patient needs. Patient will implement behavior modifications to help lower BP. Patient verbalized understanding. 

## 2014-06-14 NOTE — Progress Notes (Signed)
Patient [facility] a [facility] and is currently a [facility] patient effective 03/12/2014.   Clinical Measurements: Patient is 5 ft. 3 3/4 inches, weight 181.3 lbs, waist circumference 44 inches, and hip circumference 51.5 inches.   Medical History: Patient has no history of high cholesterol or diabetes.Patient does have a history of hypertension. Patient stated that only takes when needed.  Per patient no diagnosed history of coronary heart disease, heart attack, heart failure, stroke/TIA, vascular disease or congenital heart defects.   Blood Pressure, Self-measurement: Patient states that does not have equipment to check Blood pressure.   Nutrition Assessment: Patient stated that eats 2 fruits every day. Patient states she eats one servings of vegetables a day. Per patient  Does not eat 3 or more ounces of whole grains daily. Patient stated doesn't eat two or more servings of fish weekly. Patient states she does drink more than 36 ounces or 450 calories of beverages with added sugars weekly. Patient stated she does not watch her salt intake.   Physical Activity Assessment: Patient stated that walks and cleans for around 1260 minutes a week. Patient states no vigorous exercise a week.  Smoking Status: Patient states that smokes one to one and half packs a day. Per patient needs to stop.Discussed quit line and quit smart classes. Patient signed up for evening quit smart class in January. States is not around smoke.  Quality of Life Assessment: In assessing patient's quality of life she stated that out of the past 30 days that she has felt her Physical health was good all but 8 days. Patient also stated that in the past 30 days that her mental health is not good including stress, depression and problems with emotions for 30 days. Patient did state that out of the past 30 days she felt her physical or mental health had kept her from doing her usual activities including self-care, work or  recreation for 10 days.    Plan: Lab work will be done today including a lipid panel, blood glucose, and Hgb A1C. Will call lab results when they are finished.

## 2014-06-17 ENCOUNTER — Telehealth: Payer: Self-pay

## 2014-06-17 NOTE — Telephone Encounter (Signed)
Called patient lab results: Bld. Glucose - 95, HgbA1C - 5.3, Cholesterol 128, HDL - 26, LDL - 71 Triglycerides - 157. Informed patient that I had [facility] with Dr. Armen PickupFunches at 9 AM on January 21st. Explained not to let do labs or anything else until does eligibility appointment. Discussed with patient about options for Health Coaching, Weight Watchers or Community programs concerning abnormal lab values and BMI of 31.37 (obese). [facility]

## 2014-06-21 ENCOUNTER — Ambulatory Visit: Payer: Self-pay

## 2014-06-21 DIAGNOSIS — Z789 Other specified health status: Secondary | ICD-10-CM

## 2014-06-21 NOTE — Patient Instructions (Signed)
Patient will follow 1600 calorie diet plan. Will review all handouts and exercise/activity book. Will work through assessments in [employer] [employer] book. Will increase exercise and use pedometer. Will measure portion sizes. Will call if has any questions. Will call patient in three weeks. Patient verbalized understanding.

## 2014-06-21 NOTE — Progress Notes (Signed)
Patient returns today for Health Coaching regarding Nutrition for her obese level BMI(31.37), HDL, triglycerides, and activity.   NUTRITION:  Reviewed patient lab results, BP,and other numbers.Patient viewed BMI chart to see that she is considered obese. Patient reviewed 8 ways to improve cholesterol pamphlet and handout on triglycerides. Did first assessment in [employer] [facility] and reviewed the book and how to use the program. Patient verbalized understanding.Discussed increasing fiber in diet, reading labels and measuring serving sizes. Discussed watching carbohydrates. Discussed changing over to artificial sweetened drinks. Patient went over 1,600 cal diet and we broke it down to the number of servings she could have. Discussed the importance of not skipping meals/days and  why it is important to eat 3 to 5 small meals a day.  Patient received and reviewed the following handouts:  8 ways to improve your cholesterol, My Plate,[employer] Program book,and [place] [employer] Cookbook. Gave patient a water bottle, snack container, magnets, and measuring cup to measure serving sizes. I demonstrated the serving sizes with measuring cup. Patient voiced that knows cannot have real sugar and 4 to six servings of either or combination of Milk Products, starches and Fruit.   ACTIVITY: Discussed activity and walking. Reviewed [employer] Activity booklet, 21 ways to increase activity in your day, and Get Active 10 Minutes at a Time booklet .Received pedometer for increasing activity Pedometer was explained and shown how to use.  HYPERTENSION: Discussed hypertension: cause, effects, treatment, healthy weight, eating right and medication. Went over and gave handout concerning ten ways to decrease salt in your life. [facility]   SMOKING CESSATION: Patient is enrolled in Quit Smart class starting at 5:30 PM on January 19 th.   Miscellaneous: Discussed not letting doctor's office  give shots or draw more blood. Will make an appointment for one free visit. Discussed and received information on where and what was needed to obtain orange card.  PLAN: Patient will increase amount of walking per day and add other exercises to plan, Lose weight. Control portion sizes. Eat at least three meals a day. Follow up times three per phone unless needs and can call and come in for more Health Coaching.

## 2014-06-24 ENCOUNTER — Encounter: Payer: Self-pay | Admitting: Family Medicine

## 2014-06-27 ENCOUNTER — Ambulatory Visit: Payer: Self-pay | Admitting: Family Medicine

## 2014-07-29 ENCOUNTER — Telehealth: Payer: Self-pay

## 2014-07-29 ENCOUNTER — Encounter: Payer: Self-pay | Admitting: Nurse Practitioner

## 2014-07-29 NOTE — Telephone Encounter (Signed)
Patient called to say that had cancelled doctor's appointment due to snow and was having trouble getting [facility] to res[facility]. Stated that phone was always busy and they called back once and told her to call again for appointment.Patient stated that knew blood pressure was up because was having headaches.   Patient stated that was drinking water, eating wheat bread and eating better. Patient stated that had cut down cigarettes from three packs to one pack a day. Stated that had gotten Nicotine patches but not started them. We discussed going to [employer] cigarettes when purchase next pack and then start the patches. Informed patient that then decrease should decrease amount smoking more until quit.Discussed finding ways to cope with withdrawal urges.  Patient stated when asked that was not using salt and decreasing amount sodium in diet.  Plan: Call [facility] for appointment.   Called patient back to inform that has appointment for March 1 at 3 PM. Told patient that needed to call breast center for appointment and told when could go and get eligibility for orange card. Discussed that would [facility]

## 2014-08-06 ENCOUNTER — Ambulatory Visit: Payer: Self-pay | Admitting: Family Medicine

## 2014-09-04 ENCOUNTER — Other Ambulatory Visit: Payer: Self-pay

## 2014-09-11 ENCOUNTER — Other Ambulatory Visit: Payer: Self-pay

## 2014-09-11 ENCOUNTER — Ambulatory Visit: Payer: Self-pay | Admitting: Internal Medicine

## 2014-10-02 ENCOUNTER — Other Ambulatory Visit: Payer: Self-pay

## 2014-10-04 ENCOUNTER — Encounter: Payer: Self-pay | Admitting: Obstetrics & Gynecology

## 2014-11-19 ENCOUNTER — Encounter (HOSPITAL_BASED_OUTPATIENT_CLINIC_OR_DEPARTMENT_OTHER): Payer: Self-pay | Admitting: Emergency Medicine

## 2014-11-19 ENCOUNTER — Emergency Department (HOSPITAL_BASED_OUTPATIENT_CLINIC_OR_DEPARTMENT_OTHER)
Admission: EM | Admit: 2014-11-19 | Discharge: 2014-11-19 | Disposition: A | Discharge status: Home / Self Care | Payer: Self-pay | Attending: Emergency Medicine | Admitting: Emergency Medicine

## 2014-11-19 DIAGNOSIS — R1084 Generalized abdominal pain: Secondary | ICD-10-CM | POA: Insufficient documentation

## 2014-11-19 DIAGNOSIS — F419 Anxiety disorder, unspecified: Secondary | ICD-10-CM | POA: Insufficient documentation

## 2014-11-19 DIAGNOSIS — Z720 Tobacco use: Secondary | ICD-10-CM | POA: Insufficient documentation

## 2014-11-19 DIAGNOSIS — G43909 Migraine, unspecified, not intractable, without status migrainosus: Secondary | ICD-10-CM | POA: Insufficient documentation

## 2014-11-19 DIAGNOSIS — Z792 Long term (current) use of antibiotics: Secondary | ICD-10-CM | POA: Insufficient documentation

## 2014-11-19 DIAGNOSIS — Z3202 Encounter for pregnancy test, result negative: Secondary | ICD-10-CM | POA: Insufficient documentation

## 2014-11-19 DIAGNOSIS — Z79899 Other long term (current) drug therapy: Secondary | ICD-10-CM | POA: Insufficient documentation

## 2014-11-19 DIAGNOSIS — R112 Nausea with vomiting, unspecified: Secondary | ICD-10-CM | POA: Insufficient documentation

## 2014-11-19 DIAGNOSIS — Z8619 Personal history of other infectious and parasitic diseases: Secondary | ICD-10-CM | POA: Insufficient documentation

## 2014-11-19 DIAGNOSIS — Z87442 Personal history of urinary calculi: Secondary | ICD-10-CM | POA: Insufficient documentation

## 2014-11-19 DIAGNOSIS — M545 Low back pain: Secondary | ICD-10-CM | POA: Insufficient documentation

## 2014-11-19 DIAGNOSIS — F329 Major depressive disorder, single episode, unspecified: Secondary | ICD-10-CM | POA: Insufficient documentation

## 2014-11-19 LAB — PREGNANCY, URINE: Preg Test, Ur: NEGATIVE

## 2014-11-19 LAB — URINALYSIS, ROUTINE W REFLEX MICROSCOPIC
Bilirubin Urine: NEGATIVE
Glucose, UA: NEGATIVE mg/dL
KETONES UR: NEGATIVE mg/dL
NITRITE: NEGATIVE
Protein, ur: NEGATIVE mg/dL
SPECIFIC GRAVITY, URINE: 1.024 (ref 1.005–1.030)
Urobilinogen, UA: 1 mg/dL (ref 0.0–1.0)
pH: 8.5 — ABNORMAL HIGH (ref 5.0–8.0)

## 2014-11-19 LAB — URINE MICROSCOPIC-ADD ON

## 2014-11-19 MED ORDER — ONDANSETRON HCL 4 MG/2ML IJ SOLN
4.0000 mg | Freq: Once | INTRAMUSCULAR | Status: AC
  Administered 2014-11-19: 4 mg via INTRAVENOUS

## 2014-11-19 MED ORDER — ONDANSETRON HCL 4 MG/2ML IJ SOLN
INTRAMUSCULAR | Status: AC
  Administered 2014-11-19: 4 mg via INTRAVENOUS
  Filled 2014-11-19: qty 2

## 2014-11-19 MED ORDER — PROMETHAZINE HCL 25 MG PO TABS: 25.0000 mg | ORAL_TABLET | Freq: Four times a day (QID) | ORAL | Status: DC | PRN

## 2014-11-19 MED ORDER — SODIUM CHLORIDE 0.9 % IV BOLUS (SEPSIS)
1000.0000 mL | Freq: Once | INTRAVENOUS | Status: AC
  Administered 2014-11-19: 1000 mL via INTRAVENOUS

## 2014-11-19 MED ORDER — PROMETHAZINE HCL 25 MG/ML IJ SOLN
25.0000 mg | Freq: Once | INTRAMUSCULAR | Status: AC
  Administered 2014-11-19: 25 mg via INTRAVENOUS
  Filled 2014-11-19: qty 1

## 2014-11-19 NOTE — ED Provider Notes (Signed)
CSN: [id number]     Arrival date & time 11/19/14  0459 History   First MD Initiated Contact with Patient 11/19/14 0515     Chief Complaint  Patient presents with  . Vomiting      (Consider location/radiation/quality/duration/timing/severity/associated sxs/prior Treatment) HPI  This is a 39 year old female who developed the sudden onset of nausea and vomiting yesterday afternoon about 1:30 PM. She has vomited approximately 10 times since. The vomiting is exacerbated by attempting to eat or drink anything. She took some over-the-counter Emetrol without relief. She is having some lower abdominal cramping but thinks this may be due to her current menses. She is also having some low back pain. She rates her pain about a 5 out of 10. It is noted that she has a history of narcotic abuse.  Past Medical History  Diagnosis Date  . Anxiety   . Herpes infection   . Depression   . Narcotic abuse   . Kidney stones   . Migraines   . Hepatitis C    Past Surgical History  Procedure Laterality Date  . Arm surgery    . Tubal ligation    . Left elbow surgery      Family History  Problem Relation Age of Onset  . Parkinson's disease Mother   . Hypertension Mother   . Parkinson's disease Maternal Grandmother   . Hypertension Maternal Grandmother   . Cancer Maternal Grandmother     lung  . Glaucoma Maternal Grandmother    History  Substance Use Topics  . Smoking status: Current Some Day Smoker -- 2.00 packs/day for 3 years    Types: Cigarettes  . Smokeless tobacco: Never Used  . Alcohol Use: No   OB History    Gravida Para Term Preterm AB TAB SAB Ectopic Multiple Living   Review of Systems  All other systems reviewed and are negative.   Allergies  Haldol  Home Medications   Prior to Admission medications   Medication Sig Start Date End Date Taking? Authorizing Provider  carbamide peroxide (DEBROX) 6.5 % otic solution Place 5 drops into both ears 2 (two) times  daily. Patient not taking: Reported on 06/03/2014 09/22/13   April Palumbo, MD  cephALEXin (KEFLEX) 500 MG capsule Take 1 capsule (500 mg total) by mouth 4 (four) times daily. 06/03/14   Nicole Pisciotta, PA-C  cloNIDine (CATAPRES) 0.2 MG tablet Take 0.5 tablets (0.1 mg total) by mouth 2 (two) times daily. Patient not taking: Reported on 06/03/2014 03/19/13   Derwood Kaplan, MD  ibuprofen (ADVIL,MOTRIN) 600 MG tablet Take 1 tablet (600 mg total) by mouth every 6 (six) hours as needed for pain. Patient not taking: Reported on 06/03/2014 03/19/13   Derwood Kaplan, MD  loperamide (IMODIUM) 2 MG capsule Take 1 capsule (2 mg total) by mouth 4 (four) times daily as needed for diarrhea or loose stools. Patient not taking: Reported on 06/03/2014 03/19/13   Derwood Kaplan, MD  oxyCODONE-acetaminophen (PERCOCET/ROXICET) 5-325 MG per tablet 1 to 2 tabs PO q6hrs  PRN for pain 06/03/14   Joni Reining Pisciotta, PA-C  promethazine (PHENERGAN) 25 MG tablet Take 1 tablet (25 mg total) by mouth every 6 (six) hours as needed for nausea. Patient not taking: Reported on 06/03/2014 05/01/11 05/08/11  Felisa Bonier, MD  promethazine (PHENERGAN) 25 MG tablet Take 1 tablet (25 mg total) by mouth every 6 (six) hours as needed for nausea. Patient not  taking: Reported on 06/03/2014 03/19/13   Derwood Kaplan, MD  topiramate (TOPAMAX) 50 MG tablet Take 50 mg by mouth 2 (two) times daily.    Historical Provider, MD   BP 151/63 mmHg  Pulse 60  Temp(Src) 98.5 F (36.9 C) (Oral)  Resp 20  Ht 5\' 4"  (1.626 m)  Wt 150 lb (68.04 kg)  BMI 25.73 kg/m2  SpO2 100%  LMP 11/18/2014   Physical Exam  General: Well-developed, well-nourished female in no acute distress; appearance consistent with age of record HENT: normocephalic; atraumatic Eyes: pupils equal, round and reactive to light; extraocular muscles intact Neck: supple Heart: regular rate and rhythm Lungs: clear to auscultation bilaterally Abdomen: soft; nondistended; mild  diffuse tenderness; no masses or hepatosplenomegaly; bowel sounds present Extremities: No deformity; full range of motion; pulses normal Neurologic: Awake, alert and oriented; motor function intact in all extremities and symmetric; no facial droop Skin: Warm and dry Psychiatric: Flat affect    ED Course  Procedures (including critical care time)   MDM   Nursing notes and vitals signs, including pulse oximetry, reviewed.  Summary of this visit's results, reviewed by myself:  Labs:  Results for orders placed or performed during the hospital encounter of 11/19/14 (from the past 24 hour(s))  Urinalysis, Routine w reflex microscopic (not at [facility])     Status: Abnormal   Collection Time: 11/19/14  5:53 AM  Result Value Ref Range   Color, Urine YELLOW YELLOW   APPearance TURBID (A) CLEAR   Specific Gravity, Urine 1.024 1.005 - 1.030   pH 8.5 (H) 5.0 - 8.0   Glucose, UA NEGATIVE NEGATIVE mg/dL   Hgb urine dipstick TRACE (A) NEGATIVE   Bilirubin Urine NEGATIVE NEGATIVE   Ketones, ur NEGATIVE NEGATIVE mg/dL   Protein, ur NEGATIVE NEGATIVE mg/dL   Urobilinogen, UA 1.0 0.0 - 1.0 mg/dL   Nitrite NEGATIVE NEGATIVE   Leukocytes, UA TRACE (A) NEGATIVE  Pregnancy, urine     Status: None   Collection Time: 11/19/14  5:53 AM  Result Value Ref Range   Preg Test, Ur NEGATIVE NEGATIVE  Urine microscopic-add on     Status: Abnormal   Collection Time: 11/19/14  5:53 AM  Result Value Ref Range   Squamous Epithelial / LPF FEW (A) RARE   WBC, UA 0-2 <3 WBC/hpf   RBC / HPF 0-2 <3 RBC/hpf   Urine-Other AMORPHOUS URATES/PHOSPHATES    6:37 AM Patient feeling better on Friday fluids, Zofran and Phenergan. Tolerating fluids without emesis.  Paula Libra, MD 11/19/14 [phone]

## 2014-11-19 NOTE — ED Notes (Signed)
Pt reports sudden onset of emesis onset 1300 yesterday and reports > ten emesis since then

## 2015-02-01 ENCOUNTER — Emergency Department (HOSPITAL_BASED_OUTPATIENT_CLINIC_OR_DEPARTMENT_OTHER)
Admission: EM | Admit: 2015-02-01 | Discharge: 2015-02-01 | Disposition: A | Discharge status: Home / Self Care | Payer: Self-pay | Attending: Emergency Medicine | Admitting: Emergency Medicine

## 2015-02-01 ENCOUNTER — Encounter (HOSPITAL_BASED_OUTPATIENT_CLINIC_OR_DEPARTMENT_OTHER): Payer: Self-pay | Admitting: *Deleted

## 2015-02-01 DIAGNOSIS — Z8619 Personal history of other infectious and parasitic diseases: Secondary | ICD-10-CM | POA: Insufficient documentation

## 2015-02-01 DIAGNOSIS — Z720 Tobacco use: Secondary | ICD-10-CM | POA: Insufficient documentation

## 2015-02-01 DIAGNOSIS — Z3202 Encounter for pregnancy test, result negative: Secondary | ICD-10-CM | POA: Insufficient documentation

## 2015-02-01 DIAGNOSIS — G43809 Other migraine, not intractable, without status migrainosus: Secondary | ICD-10-CM | POA: Insufficient documentation

## 2015-02-01 DIAGNOSIS — R112 Nausea with vomiting, unspecified: Secondary | ICD-10-CM

## 2015-02-01 DIAGNOSIS — F329 Major depressive disorder, single episode, unspecified: Secondary | ICD-10-CM | POA: Insufficient documentation

## 2015-02-01 DIAGNOSIS — E876 Hypokalemia: Secondary | ICD-10-CM | POA: Insufficient documentation

## 2015-02-01 DIAGNOSIS — Z87442 Personal history of urinary calculi: Secondary | ICD-10-CM | POA: Insufficient documentation

## 2015-02-01 DIAGNOSIS — F419 Anxiety disorder, unspecified: Secondary | ICD-10-CM | POA: Insufficient documentation

## 2015-02-01 LAB — CBC WITH DIFFERENTIAL/PLATELET
BASOS ABS: 0 10*3/uL (ref 0.0–0.1)
BASOS PCT: 0 % (ref 0–1)
Basophils Absolute: 0.1 10*3/uL (ref 0.0–0.1)
Basophils Relative: 0 % (ref 0–1)
EOS ABS: 0.1 10*3/uL (ref 0.0–0.7)
EOS ABS: 0.2 10*3/uL (ref 0.0–0.7)
Eosinophils Relative: 1 % (ref 0–5)
Eosinophils Relative: 2 % (ref 0–5)
HCT: 41.6 % (ref 36.0–46.0)
HEMATOCRIT: 39.7 % (ref 36.0–46.0)
HEMOGLOBIN: 12.6 g/dL (ref 12.0–15.0)
HEMOGLOBIN: 13.5 g/dL (ref 12.0–15.0)
LYMPHS ABS: 1.5 10*3/uL (ref 0.7–4.0)
Lymphocytes Relative: 12 % (ref 12–46)
Lymphocytes Relative: 29 % (ref 12–46)
Lymphs Abs: 2.5 10*3/uL (ref 0.7–4.0)
MCH: 29.7 pg (ref 26.0–34.0)
MCH: 28.6 pg (ref 26.0–34.0)
MCHC: 32.5 g/dL (ref 30.0–36.0)
MCHC: 31.7 g/dL (ref 30.0–36.0)
MCV: 88.1 fL (ref 78.0–100.0)
MCV: 93.6 fL (ref 78.0–100.0)
Monocytes Absolute: 0.7 10*3/uL (ref 0.1–1.0)
Monocytes Absolute: 0.9 10*3/uL (ref 0.1–1.0)
Monocytes Relative: 7 % (ref 3–12)
Monocytes Relative: 8 % (ref 3–12)
NEUTROS ABS: 9.6 10*3/uL — AB (ref 1.7–7.7)
NEUTROS PCT: 61 % (ref 43–77)
NEUTROS PCT: 80 % — AB (ref 43–77)
Neutro Abs: 5.4 10*3/uL (ref 1.7–7.7)
Platelets: 314 10*3/uL (ref 150–400)
Platelets: 352 10*3/uL (ref 150–400)
RBC: 4.24 MIL/uL (ref 3.87–5.11)
RBC: 4.72 MIL/uL (ref 3.87–5.11)
RDW: 13.1 % (ref 11.5–15.5)
RDW: 14.3 % (ref 11.5–15.5)
WBC: 12.1 10*3/uL — AB (ref 4.0–10.5)
WBC: 8.8 10*3/uL (ref 4.0–10.5)

## 2015-02-01 LAB — URINALYSIS, ROUTINE W REFLEX MICROSCOPIC
Bilirubin Urine: NEGATIVE
Glucose, UA: NEGATIVE mg/dL
Ketones, ur: NEGATIVE mg/dL
LEUKOCYTES UA: NEGATIVE
Nitrite: NEGATIVE
PROTEIN: NEGATIVE mg/dL
Specific Gravity, Urine: 1.023 (ref 1.005–1.030)
Urobilinogen, UA: 0.2 mg/dL (ref 0.0–1.0)
pH: 5.5 (ref 5.0–8.0)

## 2015-02-01 LAB — RAPID URINE DRUG SCREEN, HOSP PERFORMED
Amphetamines: NOT DETECTED
Barbiturates: NOT DETECTED
Benzodiazepines: POSITIVE — AB
Cocaine: POSITIVE — AB
Opiates: NOT DETECTED
Tetrahydrocannabinol: POSITIVE — AB

## 2015-02-01 LAB — COMPREHENSIVE METABOLIC PANEL
ALBUMIN: 4.1 g/dL (ref 3.5–5.0)
ALK PHOS: 57 U/L (ref 38–126)
ALT: 10 U/L — ABNORMAL LOW (ref 14–54)
ANION GAP: 8 (ref 5–15)
AST: 22 U/L (ref 15–41)
BILIRUBIN TOTAL: 0.3 mg/dL (ref 0.3–1.2)
BUN: 14 mg/dL (ref 6–20)
CO2: 25 mmol/L (ref 22–32)
Calcium: 9.1 mg/dL (ref 8.9–10.3)
Chloride: 106 mmol/L (ref 101–111)
Creatinine, Ser: 0.84 mg/dL (ref 0.44–1.00)
GFR calc Af Amer: >60 mL/min (ref >60)
GFR calc non Af Amer: >60 mL/min (ref >60)
GLUCOSE: 111 mg/dL — AB (ref 65–99)
Potassium: 3.4 mmol/L — ABNORMAL LOW (ref 3.5–5.1)
SODIUM: 139 mmol/L (ref 135–145)
TOTAL PROTEIN: 7.5 g/dL (ref 6.5–8.1)

## 2015-02-01 LAB — URINE MICROSCOPIC-ADD ON

## 2015-02-01 LAB — LIPASE, BLOOD: Lipase: 33 U/L (ref 22–51)

## 2015-02-01 LAB — PREGNANCY, URINE: PREG TEST UR: NEGATIVE

## 2015-02-01 MED ORDER — DIPHENHYDRAMINE HCL 50 MG/ML IJ SOLN
INTRAMUSCULAR | Status: DC
Start: 2015-02-01 — End: 2015-02-01
  Filled 2015-02-01: qty 1

## 2015-02-01 MED ORDER — DIPHENHYDRAMINE HCL 12.5 MG/5ML PO ELIX
12.5000 mg | ORAL_SOLUTION | Freq: Once | ORAL | Status: DC
  Filled 2015-02-01: qty 10

## 2015-02-01 MED ORDER — DIPHENHYDRAMINE HCL 50 MG/ML IJ SOLN: 12.5000 mg | Freq: Once | INTRAMUSCULAR | Status: DC

## 2015-02-01 MED ORDER — METOCLOPRAMIDE HCL 5 MG/ML IJ SOLN
10.0000 mg | Freq: Once | INTRAMUSCULAR | Status: AC
  Administered 2015-02-01: 10 mg via INTRAVENOUS
  Filled 2015-02-01: qty 2

## 2015-02-01 MED ORDER — ONDANSETRON HCL 4 MG/2ML IJ SOLN
4.0000 mg | Freq: Once | INTRAMUSCULAR | Status: AC
  Administered 2015-02-01: 4 mg via INTRAVENOUS
  Filled 2015-02-01: qty 2

## 2015-02-01 MED ORDER — DIPHENHYDRAMINE HCL 50 MG/ML IJ SOLN
12.5000 mg | Freq: Once | INTRAMUSCULAR | Status: AC
  Administered 2015-02-01: 12.5 mg via INTRAVENOUS

## 2015-02-01 MED ORDER — SODIUM CHLORIDE 0.9 % IV BOLUS (SEPSIS)
1000.0000 mL | Freq: Once | INTRAVENOUS | Status: AC
  Administered 2015-02-01: 1000 mL via INTRAVENOUS

## 2015-02-01 MED ORDER — ONDANSETRON 4 MG PO TBDP
ORAL_TABLET | ORAL | Status: DC
Start: 2015-02-01 — End: 2015-04-04

## 2015-02-01 MED ORDER — POTASSIUM CHLORIDE CRYS ER 20 MEQ PO TBCR: 40.0000 meq | EXTENDED_RELEASE_TABLET | Freq: Once | ORAL | Status: DC

## 2015-02-01 NOTE — ED Notes (Signed)
Placed on cont cardiac monitoring, with cont POX

## 2015-02-01 NOTE — Discharge Instructions (Signed)
1. Medications: zofran, usual home medications °2. Treatment: rest, drink plenty of fluids, advance diet slowly °3. Follow Up: Please followup with your primary doctor in 2 days for discussion of your diagnoses and further evaluation after today's visit; if you do not have a primary care doctor use the resource guide provided to find one; Please return to the ER for persistent vomiting, high fevers or worsening symptoms ° °

## 2015-02-01 NOTE — ED Notes (Signed)
Reports vomiting since Sunday, body aches, headache since Tuesday, "passed out" 2 days ago

## 2015-02-01 NOTE — ED Provider Notes (Signed)
CSN: [id number]     Arrival date & time 02/01/15  1105 History   First MD Initiated Contact with Patient 02/01/15 1201     Chief Complaint  Patient presents with  . Emesis     (Consider location/radiation/quality/duration/timing/severity/associated sxs/prior Treatment) HPI   Nancy Terry is a 39 y.o. female  with a hx of migraine headache, anxiety, depression, narcotic abuse presents to the Emergency Department complaining of gradual, persistent, progressively worsening generalized and throbbing headache onset 4 days ago. Denies thunderclap headache, visual changes, fever or worse headache of her life.  Associated symptoms include nausea and vomiting.  Pt reports this is common with her headaches and this headache feels like previous migraines.  She reports generalized abd pain from the vomiting.  She reports no treatments PTA.  Nothing makes it better and nothing makes it worse.  Pt denies fever, chills, neck pain, neck stiffness, chest pain, SOB, diarrhea, weakness, dizziness, dysuria, vaginal discharge.  Pt reports she "passed out" 2 days ago, but cannot give me specifics. She denied associated CP, SOB, diaphoresis at that incident.  She denies known cardiac problems and record review reveals no hx of arrhythmia or cardiac diagnosis.     Past Medical History  Diagnosis Date  . Anxiety   . Herpes infection   . Depression   . Narcotic abuse   . Kidney stones   . Migraines   . Hepatitis C    Past Surgical History  Procedure Laterality Date  . Arm surgery    . Tubal ligation    . Left elbow surgery      Family History  Problem Relation Age of Onset  . Parkinson's disease Mother   . Hypertension Mother   . Parkinson's disease Maternal Grandmother   . Hypertension Maternal Grandmother   . Cancer Maternal Grandmother     lung  . Glaucoma Maternal Grandmother    Social History  Substance Use Topics  . Smoking status: Current Some Day Smoker -- 0.50 packs/day for 3 years   Types: Cigarettes  . Smokeless tobacco: Never Used  . Alcohol Use: No   OB History    Gravida Para Term Preterm AB TAB SAB Ectopic Multiple Living   Review of Systems  Constitutional: Negative for fever, diaphoresis, appetite change, fatigue and unexpected weight change.  HENT: Negative for mouth sores.   Eyes: Negative for visual disturbance.  Respiratory: Negative for cough, chest tightness, shortness of breath and wheezing.   Cardiovascular: Negative for chest pain.  Gastrointestinal: Positive for nausea and vomiting. Negative for abdominal pain, diarrhea and constipation.  Endocrine: Negative for polydipsia, polyphagia and polyuria.  Genitourinary: Negative for dysuria, urgency, frequency and hematuria.  Musculoskeletal: Negative for back pain and neck stiffness.  Skin: Negative for rash.  Allergic/Immunologic: Negative for immunocompromised state.  Neurological: Positive for syncope and headaches. Negative for light-headedness.  Hematological: Does not bruise/bleed easily.  Psychiatric/Behavioral: Negative for sleep disturbance. The patient is not nervous/anxious.       Allergies  Haldol  Home Medications   Prior to Admission medications   Medication Sig Start Date End Date Taking? Authorizing Provider  methadone (DOLOPHINE) 10 MG/5ML solution Take 30 mg by mouth. Takes twice a week   Yes Historical Provider, MD  ondansetron (ZOFRAN ODT) 4 MG disintegrating tablet  ODT q4 hours prn nausea/vomit 02/01/15   Kenzie Flakes, PA-C  promethazine (PHENERGAN) 25 MG tablet Take 1 tablet (25  mg total) by mouth every 6 (six) hours as needed for nausea or vomiting. 11/19/14   John Molpus, MD   BP 118/70 mmHg  Pulse 64  Temp(Src) 98.1 F (36.7 C) (Oral)  Resp 18  Ht 5\' 4"  (1.626 m)  Wt 168 lb (76.204 kg)  BMI 28.82 kg/m2  SpO2 98%  LMP 01/08/2015 Physical Exam  Constitutional: She is oriented to person, place, and time. She appears well-developed and  well-nourished. No distress.  Sleepy, alert, nontoxic appearance  HENT:  Head: Normocephalic and atraumatic.  Right Ear: Tympanic membrane, external ear and ear canal normal.  Left Ear: Tympanic membrane, external ear and ear canal normal.  Nose: Nose normal. No epistaxis. Right sinus exhibits no maxillary sinus tenderness and no frontal sinus tenderness. Left sinus exhibits no maxillary sinus tenderness and no frontal sinus tenderness.  Mouth/Throat: Uvula is midline and oropharynx is clear and moist. Mucous membranes are not pale, dry and not cyanotic. No oropharyngeal exudate, posterior oropharyngeal edema, posterior oropharyngeal erythema or tonsillar abscesses.  Mucous membranes dry  Eyes: Conjunctivae and EOM are normal. Pupils are equal, round, and reactive to light. No scleral icterus.  No horizontal, vertical or rotational nystagmus  Neck: Normal range of motion and full passive range of motion without pain. Neck supple.  Full active and passive ROM without pain No midline or paraspinal tenderness No nuchal rigidity or meningeal signs  Cardiovascular: Normal rate, regular rhythm and intact distal pulses.   Pulmonary/Chest: Effort normal and breath sounds normal. No stridor. No respiratory distress. She has no wheezes. She has no rales.  Equal chest expansion  Abdominal: Soft. Bowel sounds are normal. She exhibits no distension and no mass. There is no tenderness. There is no rebound and no guarding.  Soft and nontender  Musculoskeletal: Normal range of motion. She exhibits no edema.  Lymphadenopathy:    She has no cervical adenopathy.  Neurological: She is oriented to person, place, and time. She has normal reflexes. No cranial nerve deficit. She exhibits normal muscle tone. Coordination normal. GCS eye subscore is 3. GCS verbal subscore is 5. GCS motor subscore is 6.  Mental Status:  Alert, oriented.  Pt very sleepy.  Arouses easily to verbal stimuli but continues to fall asleep  when not stimulated.  Speech fluent without evidence of aphasia. Able to follow 2 step commands without difficulty.  Cranial Nerves:  II:  Peripheral visual fields grossly normal, pupils equal, round, reactive to light III,IV, VI: ptosis not present, extra-ocular motions intact bilaterally  V,VII: smile symmetric, facial light touch sensation equal VIII: hearing grossly normal bilaterally  IX,X: midline uvula rise  XI: bilateral shoulder shrug equal and strong XII: midline tongue extension  Motor:  5/5 in upper and lower extremities bilaterally including strong and equal grip strength and dorsiflexion/plantar flexion Sensory: Pinprick and light touch normal in all extremities.  Deep Tendon Reflexes: 2+ and symmetric  Cerebellar: normal finger-to-nose with bilateral upper extremities Gait: normal gait and balance CV: distal pulses palpable throughout   Skin: Skin is warm and dry. No rash noted. She is not diaphoretic.  Psychiatric: She has a normal mood and affect. Her behavior is normal. Judgment and thought content normal.  Nursing note and vitals reviewed.   ED Course  Procedures (including critical care time) Labs Review Labs Reviewed  CBC WITH DIFFERENTIAL/PLATELET - Abnormal; Notable for the following:    WBC 12.1 (*)    Neutrophils Relative % 80 (*)    Neutro Abs 9.6 (*)  All other components within normal limits  URINALYSIS, ROUTINE W REFLEX MICROSCOPIC (NOT AT [facility]) - Abnormal; Notable for the following:    APPearance CLOUDY (*)    Hgb urine dipstick TRACE (*)    All other components within normal limits  URINE RAPID DRUG SCREEN, HOSP PERFORMED - Abnormal; Notable for the following:    Cocaine POSITIVE (*)    Benzodiazepines POSITIVE (*)    Tetrahydrocannabinol POSITIVE (*)    All other components within normal limits  COMPREHENSIVE METABOLIC PANEL - Abnormal; Notable for the following:    Potassium 3.4 (*)    Glucose, Bld 111 (*)    ALT 10 (*)    All other  components within normal limits  URINE MICROSCOPIC-ADD ON - Abnormal; Notable for the following:    Squamous Epithelial / LPF MANY (*)    Bacteria, UA MANY (*)    Casts HYALINE CASTS (*)    All other components within normal limits  PREGNANCY, URINE  CBC WITH DIFFERENTIAL/PLATELET  LIPASE, BLOOD    Imaging Review No results found. I have personally reviewed and evaluated these images and lab results as part of my medical decision-making.   EKG Interpretation   Date/Time:  Saturday February 01 2015 12:25:05 EDT Ventricular Rate:  55 PR Interval:  140 QRS Duration: 92 QT Interval:  448 QTC Calculation: 428 R Axis:   38 Text Interpretation:  Sinus bradycardia with sinus arrhythmia Nonspecific  T wave abnormality Abnormal ECG Confirmed by BEATON  MD, ROBERT ([id number]) on  02/01/2015 12:33:30 PM      MDM   Final diagnoses:  Other migraine without status migrainosus, not intractable  Non-intractable vomiting with nausea, vomiting of unspecified type  Hypokalemia    Draya Felker presents with h/o migraine headache with c/o headache, nausea and vomiting.  Normal neurologic exam, abd soft and nontender.  Pt with dry mucous membranes.  Will give migraine cocktail, fluids.  Pt is very sleepy; will not give narcotic pain control.  Pt without orthostasis.    Orthostatic VS for the past 24 hrs:  BP- Lying Pulse- Lying BP- Sitting Pulse- Sitting BP- Standing at 0 minutes Pulse- Standing at 0 minutes  02/01/15 1218 111/59 mmHg 62 118/64 mmHg 69 122/72 mmHg 60    4:30 PM Patient with improvement in headache and symptoms. No emesis here.  She continues to sleep well. Symptoms are likely secondary to migraine headache.  Patient's drug screen is positive for marijuana, benzodiazepine and cocaine.  Discussed cyclic vomiting syndrome with her with persistent use of marijuana. No evidence of urinary tract infection. Pregnancy test negative. Labs reassuring.  BP 118/70 mmHg  Pulse 64  Temp(Src)  98.1 F (36.7 C) (Oral)  Resp 18  Ht 5\' 4"  (1.626 m)  Wt 168 lb (76.204 kg)  BMI 28.82 kg/m2  SpO2 98%  LMP 01/08/2015     Dierdre Forth, PA-C 02/01/15 1659  Nelva Nay, MD 02/02/15 [phone]

## 2015-02-17 ENCOUNTER — Emergency Department (HOSPITAL_BASED_OUTPATIENT_CLINIC_OR_DEPARTMENT_OTHER)
Admission: EM | Admit: 2015-02-17 | Discharge: 2015-02-17 | Disposition: A | Discharge status: Home / Self Care | Payer: Self-pay | Attending: Emergency Medicine | Admitting: Emergency Medicine

## 2015-02-17 ENCOUNTER — Encounter (HOSPITAL_BASED_OUTPATIENT_CLINIC_OR_DEPARTMENT_OTHER): Payer: Self-pay | Admitting: *Deleted

## 2015-02-17 DIAGNOSIS — Z87442 Personal history of urinary calculi: Secondary | ICD-10-CM | POA: Insufficient documentation

## 2015-02-17 DIAGNOSIS — Z8619 Personal history of other infectious and parasitic diseases: Secondary | ICD-10-CM | POA: Insufficient documentation

## 2015-02-17 DIAGNOSIS — Z8659 Personal history of other mental and behavioral disorders: Secondary | ICD-10-CM | POA: Insufficient documentation

## 2015-02-17 DIAGNOSIS — Z760 Encounter for issue of repeat prescription: Secondary | ICD-10-CM | POA: Insufficient documentation

## 2015-02-17 DIAGNOSIS — Z720 Tobacco use: Secondary | ICD-10-CM | POA: Insufficient documentation

## 2015-02-17 DIAGNOSIS — G43909 Migraine, unspecified, not intractable, without status migrainosus: Secondary | ICD-10-CM | POA: Insufficient documentation

## 2015-02-17 MED ORDER — GABAPENTIN 600 MG PO TABS: 600.0000 mg | ORAL_TABLET | Freq: Three times a day (TID) | ORAL | Status: DC

## 2015-02-17 MED ORDER — GABAPENTIN 300 MG PO CAPS: 600.0000 mg | ORAL_CAPSULE | Freq: Once | ORAL | Status: DC

## 2015-02-17 MED ORDER — ACETAMINOPHEN 325 MG PO TABS: 650.0000 mg | ORAL_TABLET | Freq: Once | ORAL | Status: DC

## 2015-02-17 NOTE — ED Provider Notes (Signed)
CSN: [id number]     Arrival date & time 02/17/15  1054 History   First MD Initiated Contact with Patient 02/17/15 1119     Chief Complaint  Patient presents with  . Medication Refill     (Consider location/radiation/quality/duration/timing/severity/associated sxs/prior Treatment) The history is provided by the patient. No language interpreter was used.  Nancy Terry is a 39 year old female with a history of anxiety, depression, narcotic abuse, migraines, and kidney stones who presents for medication refill for her Neurontin. She states that she takes 600 mg 3 times a day and ran out of her medication on Thursday. She was unable to get an appointment with her PCP for refill and was told to come to the ED. She is complaining of restless legs. She is also complaining of back pain due to beginning her menstrual cycle today. She states that she normally takes Midol but hasn't taken anything today. She denies any recent illness, fever, chills, chest pain, shortness of breath, abdominal pain.  Past Medical History  Diagnosis Date  . Anxiety   . Herpes infection   . Depression   . Narcotic abuse   . Kidney stones   . Migraines   . Hepatitis C    Past Surgical History  Procedure Laterality Date  . Arm surgery    . Tubal ligation    . Left elbow surgery      Family History  Problem Relation Age of Onset  . Parkinson's disease Mother   . Hypertension Mother   . Parkinson's disease Maternal Grandmother   . Hypertension Maternal Grandmother   . Cancer Maternal Grandmother     lung  . Glaucoma Maternal Grandmother    Social History  Substance Use Topics  . Smoking status: Current Some Day Smoker -- 0.50 packs/day for 3 years    Types: Cigarettes  . Smokeless tobacco: Never Used  . Alcohol Use: No   OB History    Gravida Para Term Preterm AB TAB SAB Ectopic Multiple Living   3 3 3       3      Review of Systems  Constitutional: Negative for fever and chills.  Musculoskeletal:  Positive for myalgias.      Allergies  Haldol  Home Medications   Prior to Admission medications   Medication Sig Start Date End Date Taking? Authorizing Provider  gabapentin (NEURONTIN) 600 MG tablet Take 1 tablet (600 mg total) by mouth 3 (three) times daily. 02/17/15   Nancy Glendenning Patel-Mills, PA-C  methadone (DOLOPHINE) 10 MG/5ML solution Take 30 mg by mouth. Takes twice a week    Historical Provider, MD  ondansetron (ZOFRAN ODT) 4 MG disintegrating tablet 4mg  ODT q4 hours prn nausea/vomit 02/01/15   Nancy Muthersbaugh, PA-C  promethazine (PHENERGAN) 25 MG tablet Take 1 tablet (25 mg total) by mouth every 6 (six) hours as needed for nausea or vomiting. 11/19/14   Nancy Molpus, MD   BP 153/93 mmHg  Pulse 70  Temp(Src) 98.6 F (37 C) (Oral)  Resp 20  Ht 5\' 5"  (1.651 m)  Wt 150 lb (68.04 kg)  BMI 24.96 kg/m2  SpO2 100%  LMP 01/08/2015 Physical Exam  Constitutional: She is oriented to person, place, and time. She appears well-developed and well-nourished. No distress.  HENT:  Head: Normocephalic and atraumatic.  Eyes: Conjunctivae are normal.  Neck: Neck supple.  Cardiovascular: Normal rate.   Pulmonary/Chest: Effort normal. No respiratory distress.  Abdominal: Soft.  Musculoskeletal: Normal range of motion.  No edema or calf tenderness  to palpation.  Neurological: She is alert and oriented to person, place, and time.  Skin: Skin is warm and dry.  Nursing note and vitals reviewed.   ED Course  Procedures (including critical care time) Labs Review Labs Reviewed - No data to display  Imaging Review No results found.   EKG Interpretation None      MDM   Final diagnoses:  Medication refill   Patient presents for medication refill for Neurontin  TID. I discussed keeping her follow-up appointment with [facility] in one month. Patient verbally agrees with the plan. Medications - No data to display      Nancy Gosselin, PA-C 02/17/15  1213  Nancy Porter, MD 02/22/15 [phone]

## 2015-02-17 NOTE — Discharge Instructions (Signed)
Medication Refill, Emergency Department Keep your follow-up appointment with [facility]. We have refilled your medication today as a courtesy to you. It is best for your medical care, however, to take care of getting refills done through your primary caregiver's office. They have your records and can do a better job of follow-up than we can in the emergency department. On maintenance medications, we often only prescribe enough medications to get you by until you are able to see your regular caregiver. This is a more expensive way to refill medications. In the future, please plan for refills so that you will not have to use the emergency department for this. Thank you for your help. Your help allows [place] to better take care of the daily emergencies that enter our department. Document Released: 09/10/2003 Document Revised: 08/16/2011 Document Reviewed: 08/31/2013 [facility] Patient Information 2015 [place], [place]. This information is not intended to replace advice given to you by your health care provider. Make sure you discuss any questions you have with your health care provider.

## 2015-02-17 NOTE — ED Notes (Signed)
States she ran out of Neurontin. Pain in her legs and back.

## 2015-03-25 NOTE — Progress Notes (Signed)
At the present time [facility]). Patient did not follow through with mammogram in October and did not follow through with going to appointment or [facility]. Patient therefore is lost to follow up and placed on inactive status.

## 2015-04-04 ENCOUNTER — Encounter (HOSPITAL_BASED_OUTPATIENT_CLINIC_OR_DEPARTMENT_OTHER): Payer: Self-pay

## 2015-04-04 ENCOUNTER — Emergency Department (HOSPITAL_BASED_OUTPATIENT_CLINIC_OR_DEPARTMENT_OTHER)
Admission: EM | Admit: 2015-04-04 | Discharge: 2015-04-04 | Disposition: A | Discharge status: Home / Self Care | Payer: Self-pay | Attending: Emergency Medicine | Admitting: Emergency Medicine

## 2015-04-04 DIAGNOSIS — Z8619 Personal history of other infectious and parasitic diseases: Secondary | ICD-10-CM | POA: Insufficient documentation

## 2015-04-04 DIAGNOSIS — G43909 Migraine, unspecified, not intractable, without status migrainosus: Secondary | ICD-10-CM | POA: Insufficient documentation

## 2015-04-04 DIAGNOSIS — Z760 Encounter for issue of repeat prescription: Secondary | ICD-10-CM | POA: Insufficient documentation

## 2015-04-04 DIAGNOSIS — Z87442 Personal history of urinary calculi: Secondary | ICD-10-CM | POA: Insufficient documentation

## 2015-04-04 DIAGNOSIS — Z8659 Personal history of other mental and behavioral disorders: Secondary | ICD-10-CM | POA: Insufficient documentation

## 2015-04-04 DIAGNOSIS — Z720 Tobacco use: Secondary | ICD-10-CM | POA: Insufficient documentation

## 2015-04-04 MED ORDER — GABAPENTIN 600 MG PO TABS: 600.0000 mg | ORAL_TABLET | Freq: Three times a day (TID) | ORAL | Status: DC

## 2015-04-04 NOTE — Discharge Instructions (Signed)
Medicine Refill at the Emergency Department  We have refilled your medicine today, but it is best for you to get refills through your primary health care provider's office. In the future, please plan ahead so you do not need to get refills from the emergency department.  If the medicine we refilled was a maintenance medicine, you may have received only enough to get you by until you are able to see your regular health care provider.     This information is not intended to replace advice given to you by your health care provider. Make sure you discuss any questions you have with your health care provider.     Document Released: 09/10/2003 Document Revised: 06/14/2014 Document Reviewed: 08/31/2013  Elsevier Interactive Patient Education 2016 [employer]

## 2015-04-04 NOTE — ED Notes (Signed)
Pt requesting refill neurontin 600mg  tid

## 2015-04-04 NOTE — ED Provider Notes (Signed)
CSN: [id number]     Arrival date & time 04/04/15  1319 History   First MD Initiated Contact with Patient 04/04/15 1356     Chief Complaint  Patient presents with  . Medication Refill     (Consider location/radiation/quality/duration/timing/severity/associated sxs/prior Treatment) HPI  Nancy Terry is a 39 y.o. female with PMH significant for anxiety, migraines, depression who presents with medication refill.  Patient states she ran out of her Neurontin yesterday.  She states she takes Neurontin 600 mg TID.  She is currently in the process of getting her medications [facility]; however, they are unable to see her until November 21.  She denies CP, fever, SOB, or abdominal pain.  She states she has been dealing with her migraines as well.   Past Medical History  Diagnosis Date  . Anxiety   . Herpes infection   . Depression   . Narcotic abuse   . Kidney stones   . Migraines   . Hepatitis C    Past Surgical History  Procedure Laterality Date  . Arm surgery    . Tubal ligation    . Left elbow surgery      Family History  Problem Relation Age of Onset  . Parkinson's disease Mother   . Hypertension Mother   . Parkinson's disease Maternal Grandmother   . Hypertension Maternal Grandmother   . Cancer Maternal Grandmother     lung  . Glaucoma Maternal Grandmother    Social History  Substance Use Topics  . Smoking status: Current Some Day Smoker -- 0.50 packs/day for 3 years    Types: Cigarettes  . Smokeless tobacco: Never Used  . Alcohol Use: No   OB History    Gravida Para Term Preterm AB TAB SAB Ectopic Multiple Living   Review of Systems All other systems negative unless otherwise stated in HPI    Allergies  Haldol  Home Medications   Prior to Admission medications   Medication Sig Start Date End Date Taking? Authorizing Provider  CLONIDINE HCL PO Take by mouth.   Yes Historical Provider, MD  gabapentin (NEURONTIN) 600 MG tablet Take 1  tablet (600 mg total) by mouth 3 (three) times daily. 04/04/15   Cheri Fowler, PA-C  methadone (DOLOPHINE) 10 MG/5ML solution Take 30 mg by mouth. Takes twice a week    Historical Provider, MD   BP 150/76 mmHg  Pulse 83  Temp(Src) 98.2 F (36.8 C) (Oral)  Resp 16  Ht  (1.626 m)  Wt 170 lb (77.111 kg)  BMI 29.17 kg/m2  SpO2 100%  LMP 03/28/2015 Physical Exam  Constitutional: She is oriented to person, place, and time. She appears well-developed and well-nourished.  HENT:  Head: Atraumatic.  Eyes: Conjunctivae are normal. No scleral icterus.  Neck: No tracheal deviation present.  Cardiovascular: Normal rate, regular rhythm and normal heart sounds.   Pulmonary/Chest: Effort normal and breath sounds normal. No respiratory distress.  Abdominal: Soft. Bowel sounds are normal. There is no tenderness.  Neurological: She is alert and oriented to person, place, and time.  Skin: Skin is warm and dry.  Psychiatric: She has a normal mood and affect. Her behavior is normal.    ED Course  Procedures (including critical care time) Labs Review Labs Reviewed - No data to display  Imaging Review No results found. I have personally reviewed and evaluated these images and lab results as part of my medical  decision-making.   EKG Interpretation None      MDM   Final diagnoses:  Medication refill    Patient presents for medication refill of her Neurontin.  VSS, NAD.  [facility] and will receive her medication through them.  Will give refill and urged follow up with [facility].     Cheri FowlerKayla Roan Sawchuk, PA-C 04/04/15 1453  Vanetta MuldersScott Zackowski, MD 04/09/15 [phone]

## 2015-04-28 ENCOUNTER — Ambulatory Visit: Payer: Self-pay | Admitting: Family Medicine

## 2015-05-19 ENCOUNTER — Ambulatory Visit: Payer: Self-pay | Admitting: Family Medicine

## 2015-10-11 ENCOUNTER — Emergency Department (HOSPITAL_BASED_OUTPATIENT_CLINIC_OR_DEPARTMENT_OTHER)
Admission: EM | Admit: 2015-10-11 | Discharge: 2015-10-11 | Disposition: A | Discharge status: Home / Self Care | Payer: Self-pay | Attending: Emergency Medicine | Admitting: Emergency Medicine

## 2015-10-11 ENCOUNTER — Encounter (HOSPITAL_BASED_OUTPATIENT_CLINIC_OR_DEPARTMENT_OTHER): Payer: Self-pay | Admitting: *Deleted

## 2015-10-11 DIAGNOSIS — F17210 Nicotine dependence, cigarettes, uncomplicated: Secondary | ICD-10-CM | POA: Insufficient documentation

## 2015-10-11 DIAGNOSIS — F329 Major depressive disorder, single episode, unspecified: Secondary | ICD-10-CM | POA: Insufficient documentation

## 2015-10-11 DIAGNOSIS — G43909 Migraine, unspecified, not intractable, without status migrainosus: Secondary | ICD-10-CM | POA: Insufficient documentation

## 2015-10-11 DIAGNOSIS — N390 Urinary tract infection, site not specified: Secondary | ICD-10-CM | POA: Insufficient documentation

## 2015-10-11 DIAGNOSIS — G43809 Other migraine, not intractable, without status migrainosus: Secondary | ICD-10-CM

## 2015-10-11 LAB — URINALYSIS, ROUTINE W REFLEX MICROSCOPIC
BILIRUBIN URINE: NEGATIVE
Glucose, UA: NEGATIVE mg/dL
Hgb urine dipstick: NEGATIVE
KETONES UR: 40 mg/dL — AB
NITRITE: POSITIVE — AB
PH: 8.5 — AB (ref 5.0–8.0)
PROTEIN: 100 mg/dL — AB
Specific Gravity, Urine: 1.022 (ref 1.005–1.030)

## 2015-10-11 LAB — RAPID URINE DRUG SCREEN, HOSP PERFORMED
Amphetamines: NOT DETECTED
Barbiturates: NOT DETECTED
Benzodiazepines: POSITIVE — AB
Cocaine: NOT DETECTED
OPIATES: NOT DETECTED
TETRAHYDROCANNABINOL: POSITIVE — AB

## 2015-10-11 LAB — URINE MICROSCOPIC-ADD ON

## 2015-10-11 LAB — PREGNANCY, URINE: Preg Test, Ur: NEGATIVE

## 2015-10-11 MED ORDER — DIPHENHYDRAMINE HCL 50 MG/ML IJ SOLN
25.0000 mg | Freq: Once | INTRAMUSCULAR | Status: AC
  Administered 2015-10-11: 25 mg via INTRAVENOUS
  Filled 2015-10-11: qty 1

## 2015-10-11 MED ORDER — METOCLOPRAMIDE HCL 10 MG PO TABS: 10.0000 mg | ORAL_TABLET | Freq: Four times a day (QID) | ORAL | Status: DC

## 2015-10-11 MED ORDER — SODIUM CHLORIDE 0.9 % IV BOLUS (SEPSIS)
1000.0000 mL | Freq: Once | INTRAVENOUS | Status: AC
  Administered 2015-10-11: 1000 mL via INTRAVENOUS

## 2015-10-11 MED ORDER — CEPHALEXIN 500 MG PO CAPS: 500.0000 mg | ORAL_CAPSULE | Freq: Three times a day (TID) | ORAL | Status: DC

## 2015-10-11 MED ORDER — KETOROLAC TROMETHAMINE 30 MG/ML IJ SOLN
30.0000 mg | Freq: Once | INTRAMUSCULAR | Status: AC
  Administered 2015-10-11: 30 mg via INTRAVENOUS
  Filled 2015-10-11: qty 1

## 2015-10-11 MED ORDER — PROCHLORPERAZINE EDISYLATE 5 MG/ML IJ SOLN
10.0000 mg | Freq: Once | INTRAMUSCULAR | Status: AC
  Administered 2015-10-11: 10 mg via INTRAVENOUS
  Filled 2015-10-11: qty 2

## 2015-10-11 NOTE — Discharge Instructions (Signed)
Get plenty of rest at home and drink plenty of fluids. Take motrin and tylenol as needed for pain and nausea medication for nausea/vomiting. Return for worsening symptoms, including worsening pain, vomiting and unable to keep down food/fluids, confusion, fevers, or any other symptoms concerning to you.  Migraine Headache A migraine headache is very bad, throbbing pain on one or both sides of your head. Talk to your doctor about what things may bring on (trigger) your migraine headaches. HOME CARE  Only take medicines as told by your doctor.  Lie down in a dark, quiet room when you have a migraine.  Keep a journal to find out if certain things bring on migraine headaches. For example, write down:  What you eat and drink.  How much sleep you get.  Any change to your diet or medicines.  Lessen how much alcohol you drink.  Quit smoking if you smoke.  Get enough sleep.  Lessen any stress in your life.  Keep lights dim if bright lights bother you or make your migraines worse. GET HELP RIGHT AWAY IF:   Your migraine becomes really bad.  You have a fever.  You have a stiff neck.  You have trouble seeing.  Your muscles are weak, or you lose muscle control.  You lose your balance or have trouble walking.  You feel like you will pass out (faint), or you pass out.  You have really bad symptoms that are different than your first symptoms. MAKE SURE YOU:   Understand these instructions.  Will watch your condition.  Will get help right away if you are not doing well or get worse.   This information is not intended to replace advice given to you by your health care provider. Make sure you discuss any questions you have with your health care provider.   Document Released: 03/02/2008 Document Revised: 08/16/2011 Document Reviewed: 01/29/2013 Elsevier Interactive Patient Education 2016 Elsevier Inc.  Urinary Tract Infection Urinary tract infections (UTIs) can develop anywhere  along your urinary tract. Your urinary tract is your body's drainage system for removing wastes and extra water. Your urinary tract includes two kidneys, two ureters, a bladder, and a urethra. Your kidneys are a pair of bean-shaped organs. Each kidney is about the size of your fist. They are located below your ribs, one on each side of your spine. CAUSES Infections are caused by microbes, which are microscopic organisms, including fungi, viruses, and bacteria. These organisms are so small that they can only be seen through a microscope. Bacteria are the microbes that most commonly cause UTIs. SYMPTOMS  Symptoms of UTIs may vary by age and gender of the patient and by the location of the infection. Symptoms in young women typically include a frequent and intense urge to urinate and a painful, burning feeling in the bladder or urethra during urination. Older women and men are more likely to be tired, shaky, and weak and have muscle aches and abdominal pain. A fever may mean the infection is in your kidneys. Other symptoms of a kidney infection include pain in your back or sides below the ribs, nausea, and vomiting. DIAGNOSIS To diagnose a UTI, your caregiver will ask you about your symptoms. Your caregiver will also ask you to provide a urine sample. The urine sample will be tested for bacteria and white blood cells. White blood cells are made by your body to help fight infection. TREATMENT  Typically, UTIs can be treated with medication. Because most UTIs are caused by a  bacterial infection, they usually can be treated with the use of antibiotics. The choice of antibiotic and length of treatment depend on your symptoms and the type of bacteria causing your infection. HOME CARE INSTRUCTIONS  If you were prescribed antibiotics, take them exactly as your caregiver instructs you. Finish the medication even if you feel better after you have only taken some of the medication.  Drink enough water and fluids to  keep your urine clear or pale yellow.  Avoid caffeine, tea, and carbonated beverages. They tend to irritate your bladder.  Empty your bladder often. Avoid holding urine for long periods of time.  Empty your bladder before and after sexual intercourse.  After a bowel movement, women should cleanse from front to back. Use each tissue only once. SEEK MEDICAL CARE IF:   You have back pain.  You develop a fever.  Your symptoms do not begin to resolve within 3 days. SEEK IMMEDIATE MEDICAL CARE IF:   You have severe back pain or lower abdominal pain.  You develop chills.  You have nausea or vomiting.  You have continued burning or discomfort with urination. MAKE SURE YOU:   Understand these instructions.  Will watch your condition.  Will get help right away if you are not doing well or get worse.   This information is not intended to replace advice given to you by your health care provider. Make sure you discuss any questions you have with your health care provider.   Document Released: 03/03/2005 Document Revised: 02/12/2015 Document Reviewed: 07/02/2011 Elsevier Interactive Patient Education [employer]

## 2015-10-11 NOTE — ED Notes (Signed)
MD at bedside. 

## 2015-10-11 NOTE — ED Provider Notes (Signed)
CSN: [id number]     Arrival date & time 10/11/15  0713 History   First MD Initiated Contact with Patient 10/11/15 0732     Chief Complaint  Patient presents with  . Migraine     (Consider location/radiation/quality/duration/timing/severity/associated sxs/prior Treatment) HPI 40 year old female who presents with headache and vomiting. She has a history of anxiety, depression, narcotic dependence, and migraine headaches. States that she woke up yesterday morning with headache that progressively worsened. It was associated with phonophobia, photophobia, nausea and vomiting. States that this is similar to migraine headaches, although worse in severity. Pain localized to the back of her head, throbbing and pressure-like in nature. No fevers, diarrhea, abdominal pain, chest pain or difficulty breathing. No double vision, speech changes, focal numbness or weakness, or difficulty walking. No recent illnesses.  states that she has not been well-hydrated and not sleeping well recently, which the usual trigger for her migraine headaches. Past Medical History  Diagnosis Date  . Anxiety   . Herpes infection   . Depression   . Narcotic abuse   . Kidney stones   . Migraines   . Hepatitis C    Past Surgical History  Procedure Laterality Date  . Arm surgery    . Tubal ligation    . Left elbow surgery      Family History  Problem Relation Age of Onset  . Parkinson's disease Mother   . Hypertension Mother   . Parkinson's disease Maternal Grandmother   . Hypertension Maternal Grandmother   . Cancer Maternal Grandmother     lung  . Glaucoma Maternal Grandmother    Social History  Substance Use Topics  . Smoking status: Current Some Day Smoker -- 0.50 packs/day for 3 years    Types: Cigarettes  . Smokeless tobacco: Never Used  . Alcohol Use: No   OB History    Gravida Para Term Preterm AB TAB SAB Ectopic Multiple Living   3 3 3       3      Review of Systems 10/14 systems reviewed and are  negative other than those stated in the HPI    Allergies  Haldol  Home Medications   Prior to Admission medications   Medication Sig Start Date End Date Taking? Authorizing Provider  cephALEXin (KEFLEX) 500 MG capsule Take 1 capsule (500 mg total) by mouth 3 (three) times daily. 10/11/15   Lavera Guiseana Duo Daquane Aguilar, MD  CLONIDINE HCL PO Take by mouth.    Historical Provider, MD  gabapentin (NEURONTIN) 600 MG tablet Take 1 tablet (600 mg total) by mouth 3 (three) times daily. 04/04/15   Cheri FowlerKayla Rose, PA-C  methadone (DOLOPHINE) 10 MG/5ML solution Take 30 mg by mouth. Takes twice a week    Historical Provider, MD  metoCLOPramide (REGLAN) 10 MG tablet Take 1 tablet (10 mg total) by mouth every 6 (six) hours. 10/11/15   Lavera Guiseana Duo Brazil Voytko, MD   BP 142/87 mmHg  Pulse 62  Temp(Src) 97.9 F (36.6 C) (Oral)  Resp 20  Ht 5\' 6"  (1.676 m)  Wt 180 lb (81.647 kg)  BMI 29.07 kg/m2  SpO2 99% Physical Exam  Physical Exam  Nursing note and vitals reviewed. Constitutional: Well developed, well nourished, non-toxic appearance Head: Normocephalic and atraumatic.  Mouth/Throat: Oropharynx is clear and dry mucous membranes.  Neck: Normal range of motion. Neck supple. No meningismus. Cardiovascular: Normal rate and regular rhythm.  No edema. Pulmonary/Chest: Effort normal and breath sounds normal.  Abdominal: Soft. There is no tenderness. There is  no rebound and no guarding.  Musculoskeletal: Normal range of motion.  Skin: Skin is warm and dry.  Psychiatric: Cooperative Neurological:  Alert, oriented to person, place, time, and situation. Memory grossly in tact. Fluent speech. No dysarthria or aphasia.  Cranial nerves:  Pupils are symmetric, and reactive to light. EOMI without nystagmus. No gaze deviation. Facial muscles symmetric with activation. Sensation to light touch over face in tact bilaterally. Hearing grossly in tact. Palate elevates symmetrically. Head turn and shoulder shrug are intact. Tongue midline.  Muscle  bulk and tone normal. No pronator drift. Moves all extremities symmetrically. No pronator drift.  Sensation to light touch is in tact throughout in bilateral upper and lower extremities. Coordination reveals no dysmetria with finger to nose. Gait is narrow-based and steady. Non-ataxic.    ED Course  Procedures (including critical care time) Labs Review Labs Reviewed  URINE RAPID DRUG SCREEN, HOSP PERFORMED - Abnormal; Notable for the following:    Benzodiazepines POSITIVE (*)    Tetrahydrocannabinol POSITIVE (*)    All other components within normal limits  URINALYSIS, ROUTINE W REFLEX MICROSCOPIC (NOT AT [facility]) - Abnormal; Notable for the following:    Color, Urine AMBER (*)    APPearance CLOUDY (*)    pH 8.5 (*)    Ketones, ur 40 (*)    Protein, ur 100 (*)    Nitrite POSITIVE (*)    Leukocytes, UA SMALL (*)    All other components within normal limits  URINE MICROSCOPIC-ADD ON - Abnormal; Notable for the following:    Squamous Epithelial / LPF 6-30 (*)    Bacteria, UA MANY (*)    Casts GRANULAR CAST (*)    All other components within normal limits  URINE CULTURE  PREGNANCY, URINE    Imaging Review No results found. I have personally reviewed and evaluated these images and lab results as part of my medical decision-making.   EKG Interpretation None      MDM   Final diagnoses:  Other migraine without status migrainosus, not intractable  UTI (lower urinary tract infection)    40 year old female who presents with headache and vomiting, that seems consistent with that of migraine headache. She is nontoxic and in no acute distress on presentation. Her vital signs are non-concerning. She does appear mildly dry on exam. She has a normal neurological exam. History and exam not concerning for that of intracranial infection, mass, bleeding or other serious etiology Given migraine cocktail, including 1L IVF, toradol, compazine, and benadryl.   On reevaluation, patient feels  significantly improved after migraine cocktail. She is not pregnant. Her UA does suggest urinary tract infection. However, I do not think this is pyelonephritis. She is afebrile, hemodynamically stable, without abdominal pain or flank tenderness. Given course of Keflex for her UTI. Discussed continued supportive care at home for her migraine headache. Discussed strict return instructions. She expressed understanding of all discharge instructions, and felt comfortable with the plan of care.  Lavera Guise, MD 10/11/15 [phone]

## 2015-10-11 NOTE — ED Notes (Signed)
States HA is more posterior, denies fever, but having chills

## 2015-10-11 NOTE — ED Notes (Signed)
Pt states she awoke this am with HA, having nausea some vomiting, light sensitive

## 2015-10-11 NOTE — ED Notes (Addendum)
Patient c/o headache and nausea, and states that she is really anxious. No meds taken today. Patient states that she is out of gabapentin & clonidine & did not go to the methadone clinic this morning because she feels bad.

## 2015-10-13 LAB — URINE CULTURE

## 2015-10-14 ENCOUNTER — Telehealth (HOSPITAL_BASED_OUTPATIENT_CLINIC_OR_DEPARTMENT_OTHER): Payer: Self-pay | Admitting: Emergency Medicine

## 2015-10-14 NOTE — Telephone Encounter (Signed)
Post ED Visit - Positive Culture Follow-up  Culture report reviewed by antimicrobial stewardship pharmacist:  []  Nancy Terry, Pharm.D. []  Nancy Terry, [address] BCPS []  Nancy Terry, Pharm.D. []  Nancy Terry, Pharm.D., BCPS []  Nancy Terry, [address] BCPS, AAHIVP []  Nancy Terry, Pharm.D., BCPS, AAHIVP [x]  Nancy Terry, Pharm.D. []  Nancy Terry, [place]  Positive urine culture Treated with cephalexin, organism sensitive to the same and no further patient follow-up is required at this time.  Nancy Terry, Nancy Terry 10/14/2015, 9:14 AM

## 2015-11-30 ENCOUNTER — Encounter (HOSPITAL_COMMUNITY): Payer: Self-pay | Admitting: Emergency Medicine

## 2015-11-30 ENCOUNTER — Emergency Department (HOSPITAL_COMMUNITY)
Admission: EM | Admit: 2015-11-30 | Discharge: 2015-11-30 | Disposition: A | Discharge status: Home / Self Care | Payer: Self-pay | Attending: Emergency Medicine | Admitting: Emergency Medicine

## 2015-11-30 DIAGNOSIS — F17210 Nicotine dependence, cigarettes, uncomplicated: Secondary | ICD-10-CM | POA: Insufficient documentation

## 2015-11-30 DIAGNOSIS — F329 Major depressive disorder, single episode, unspecified: Secondary | ICD-10-CM | POA: Insufficient documentation

## 2015-11-30 DIAGNOSIS — Z79899 Other long term (current) drug therapy: Secondary | ICD-10-CM | POA: Insufficient documentation

## 2015-11-30 DIAGNOSIS — K219 Gastro-esophageal reflux disease without esophagitis: Secondary | ICD-10-CM | POA: Insufficient documentation

## 2015-11-30 DIAGNOSIS — Z791 Long term (current) use of non-steroidal anti-inflammatories (NSAID): Secondary | ICD-10-CM | POA: Insufficient documentation

## 2015-11-30 DIAGNOSIS — N390 Urinary tract infection, site not specified: Secondary | ICD-10-CM | POA: Insufficient documentation

## 2015-11-30 DIAGNOSIS — Z760 Encounter for issue of repeat prescription: Secondary | ICD-10-CM | POA: Insufficient documentation

## 2015-11-30 LAB — URINALYSIS, ROUTINE W REFLEX MICROSCOPIC
GLUCOSE, UA: NEGATIVE mg/dL
KETONES UR: NEGATIVE mg/dL
Nitrite: POSITIVE — AB
PROTEIN: NEGATIVE mg/dL
Specific Gravity, Urine: 1.029 (ref 1.005–1.030)
pH: 5.5 (ref 5.0–8.0)

## 2015-11-30 LAB — URINE MICROSCOPIC-ADD ON

## 2015-11-30 MED ORDER — SULFAMETHOXAZOLE-TRIMETHOPRIM 800-160 MG PO TABS: 1.0000 | ORAL_TABLET | Freq: Two times a day (BID) | ORAL | Status: DC

## 2015-11-30 MED ORDER — QUETIAPINE FUMARATE 300 MG PO TABS
300.0000 mg | ORAL_TABLET | Freq: Every day | ORAL | Status: DC
  Administered 2015-11-30: 300 mg via ORAL
  Filled 2015-11-30: qty 1

## 2015-11-30 NOTE — ED Notes (Signed)
Patient here with complaints of UTI. Reports that she was already treated with antibiotics. Symptoms have return and wants refill on antibiotics. Also requesting refill on Seroquel.

## 2015-11-30 NOTE — Discharge Instructions (Signed)
Read the information below.  Use the prescribed medication as directed.  Please discuss all new medications with your pharmacist.  You may return to the Emergency Department at any time for worsening condition or any new symptoms that concern you.  If you develop high fevers, worsening abdominal pain, uncontrolled vomiting, or are unable to tolerate fluids by mouth, return to the ER for a recheck.    Please take all of the antibiotics for your urinary tract infection.  [facility] or your primary care provider to discuss your Seroquel in order to ensure the proper dosage.   Food Choices for Gastroesophageal Reflux Disease, Adult When you have gastroesophageal reflux disease (GERD), the foods you eat and your eating habits are very important. Choosing the right foods can help ease your discomfort.  WHAT GUIDELINES DO I NEED TO FOLLOW?   Choose fruits, vegetables, whole grains, and low-fat dairy products.   Choose low-fat meat, fish, and poultry.  Limit fats such as oils, salad dressings, butter, nuts, and avocado.   Keep a food diary. This helps you identify foods that cause symptoms.   Avoid foods that cause symptoms. These may be different for everyone.   Eat small meals often instead of 3 large meals a day.   Eat your meals slowly, in a place where you are relaxed.   Limit fried foods.   Cook foods using methods other than frying.   Avoid drinking alcohol.   Avoid drinking large amounts of liquids with your meals.   Avoid bending over or lying down until 2-3 hours after eating.  WHAT FOODS ARE NOT RECOMMENDED?  These are some foods and drinks that may make your symptoms worse: Vegetables Tomatoes. Tomato juice. Tomato and spaghetti sauce. Chili peppers. Onion and garlic. Horseradish. Fruits Oranges, grapefruit, and lemon (fruit and juice). Meats High-fat meats, fish, and poultry. This includes hot dogs, ribs, ham, sausage, salami, and  bacon. Dairy Whole milk and chocolate milk. Sour cream. Cream. Butter. Ice cream. Cream cheese.  Drinks Coffee and tea. Bubbly (carbonated) drinks or energy drinks. Condiments Hot sauce. Barbecue sauce.  Sweets/Desserts Chocolate and cocoa. Donuts. Peppermint and spearmint. Fats and Oils High-fat foods. This includes [place] fries and potato chips. Other Vinegar. Strong spices. This includes black pepper, white pepper, red pepper, cayenne, curry powder, cloves, ginger, and chili powder. The items listed above may not be a complete list of foods and drinks to avoid. Contact your dietitian for more information.   This information is not intended to replace advice given to you by your health care provider. Make sure you discuss any questions you have with your health care provider.   Document Released: 11/23/2011 Document Revised: 06/14/2014 Document Reviewed: 03/28/2013 Elsevier Interactive Patient Education 2016 Elsevier Inc.  Urinary Tract Infection A urinary tract infection (UTI) can occur any place along the urinary tract. The tract includes the kidneys, ureters, bladder, and urethra. A type of germ called bacteria often causes a UTI. UTIs are often helped with antibiotic medicine.  HOME CARE   If given, take antibiotics as told by your doctor. Finish them even if you start to feel better.  Drink enough fluids to keep your pee (urine) clear or pale yellow.  Avoid tea, drinks with caffeine, and bubbly (carbonated) drinks.  Pee often. Avoid holding your pee in for a long time.  Pee before and after having sex (intercourse).  Wipe from front to back after you poop (bowel movement) if you are a woman.  Use each tissue only once. GET HELP RIGHT AWAY IF:   You have back pain.  You have lower belly (abdominal) pain.  You have chills.  You feel sick to your stomach (nauseous).  You throw up (vomit).  Your burning or discomfort with peeing does not go away.  You have a  fever.  Your symptoms are not better in 3 days. MAKE SURE YOU:   Understand these instructions.  Will watch your condition.  Will get help right away if you are not doing well or get worse.   This information is not intended to replace advice given to you by your health care provider. Make sure you discuss any questions you have with your health care provider.   Document Released: 11/10/2007 Document Revised: 06/14/2014 Document Reviewed: 12/23/2011 Elsevier Interactive Patient Education [employer] Elsevier Inc[employer]

## 2015-11-30 NOTE — ED Provider Notes (Signed)
CSN: [id number]     Arrival date & time 11/30/15  0830 History   First MD Initiated Contact with Patient 11/30/15 1048     Chief Complaint  Patient presents with  . Urinary Tract Infection     (Consider location/radiation/quality/duration/timing/severity/associated sxs/prior Treatment) The history is provided by the patient.     Patient with hx anxiety, depression, narcotic dependence, kidney stones presents to ED requesting prescriptions.  States she was seen last month and diagnosed with UTI, never took the antibiotics because she could not afford them.  Will be able to afford them now but pharmacy would not allow her to fill old prescriptions.  States she has pain with urination, itching and burning.  Has also had some vaginal burning that was relieved with Monistat.  Denies any fevers, bowel changes, vaginal discharge.  LMP current, was on time and normal.  She is not sexually active x years.   States for over one year she has had nausea and occasional vomiting in the mornings, admits to symptoms of reflux.    Her PCP is out of town and asked her to come to ED for prescription of her seroquel to cover her for the next two weeks.  Last dose was two days ago.  She is feeling anxious and somewhat depressed.  Denies SI.    Past Medical History  Diagnosis Date  . Anxiety   . Herpes infection   . Depression   . Narcotic abuse   . Kidney stones   . Migraines   . Hepatitis C    Past Surgical History  Procedure Laterality Date  . Arm surgery    . Tubal ligation    . Left elbow surgery      Family History  Problem Relation Age of Onset  . Parkinson's disease Mother   . Hypertension Mother   . Parkinson's disease Maternal Grandmother   . Hypertension Maternal Grandmother   . Cancer Maternal Grandmother     lung  . Glaucoma Maternal Grandmother    Social History  Substance Use Topics  . Smoking status: Current Some Day Smoker -- 0.50 packs/day for 3 years    Types: Cigarettes   . Smokeless tobacco: Never Used  . Alcohol Use: No   OB History    Gravida Para Term Preterm AB TAB SAB Ectopic Multiple Living   3 3 3       3      Review of Systems  All other systems reviewed and are negative.     Allergies  Haldol  Home Medications   Prior to Admission medications   Medication Sig Start Date End Date Taking? Authorizing Provider  cephALEXin (KEFLEX) 500 MG capsule Take 1 capsule (500 mg total) by mouth 3 (three) times daily. 10/11/15   Lavera Guiseana Duo Liu, MD  CLONIDINE HCL PO Take by mouth.    Historical Provider, MD  gabapentin (NEURONTIN) 600 MG tablet Take 1 tablet (600 mg total) by mouth 3 (three) times daily. 04/04/15   Cheri FowlerKayla Rose, PA-C  methadone (DOLOPHINE) 10 MG/5ML solution Take 30 mg by mouth. Takes twice a week    Historical Provider, MD  metoCLOPramide (REGLAN) 10 MG tablet Take 1 tablet (10 mg total) by mouth every 6 (six) hours. 10/11/15   Lavera Guiseana Duo Liu, MD   BP 122/71 mmHg  Pulse 55  Temp(Src) 97.9 F (36.6 C) (Oral)  Resp 16  SpO2 97% Physical Exam  Constitutional: She appears well-developed and well-nourished. No distress.  HENT:  Head:  Normocephalic and atraumatic.  Neck: Neck supple.  Cardiovascular: Normal rate and regular rhythm.   Pulmonary/Chest: Effort normal and breath sounds normal. No respiratory distress. She has no wheezes. She has no rales.  Abdominal: Soft. She exhibits no distension. There is tenderness in the suprapubic area. There is no rebound, no guarding and no CVA tenderness.  Neurological: She is alert.  Skin: She is not diaphoretic.  Nursing note and vitals reviewed.   ED Course  Procedures (including critical care time) Labs Review Labs Reviewed  URINALYSIS, ROUTINE W REFLEX MICROSCOPIC (NOT AT [facility]) - Abnormal; Notable for the following:    Color, Urine AMBER (*)    APPearance CLOUDY (*)    Hgb urine dipstick LARGE (*)    Bilirubin Urine SMALL (*)    Nitrite POSITIVE (*)    Leukocytes, UA SMALL (*)    All  other components within normal limits  URINE MICROSCOPIC-ADD ON - Abnormal; Notable for the following:    Squamous Epithelial / LPF 0-5 (*)    Bacteria, UA MANY (*)    All other components within normal limits    Imaging Review No results found. I have personally reviewed and evaluated these images and lab results as part of my medical decision-making.   EKG Interpretation None      MDM   Final diagnoses:  Prescription refill  UTI (lower urinary tract infection)  Gastroesophageal reflux disease without esophagitis    Afebrile nontoxic patient with ongoing urinary symptoms after dx UTI last month, did not fill prescription.  UA appears infected today.  Doubt ureteral stone, pyelonephritis.  Per interview, doubt vaginal infection.  Pt also requesting Seroquel prescription refill.   Unable to confirm patient's Seroquel prescription - pharmacy tech attempted to confirm with pharmacies.  Pt is now unsure of the dosage.  [facility] where [facility]t tomorrow.  D/C home with abx.  [facility] card application, establish primary care.  Discussed result, findings, treatment, and follow up  with patient.  Pt given return precautions.  Pt verbalizes understanding and agrees with plan.         Trixie Dredgemily Ordell Prichett, PA-C 11/30/15 1512  Zadie Rhineonald Wickline, MD 12/02/15 [phone]

## 2016-01-02 ENCOUNTER — Inpatient Hospital Stay: Payer: Self-pay

## 2016-01-07 ENCOUNTER — Telehealth: Payer: Self-pay | Admitting: *Deleted

## 2016-01-07 NOTE — Telephone Encounter (Signed)
Patient verified DOB Patient was scheduled on Friday 01/09/16 at 2:00 with [facility]. Patient advised to call on Wednesday 01/14/16 to obtain a HFU appointment. No further questions at this time.

## 2016-01-09 ENCOUNTER — Ambulatory Visit: Payer: Self-pay

## 2016-01-14 ENCOUNTER — Ambulatory Visit: Payer: Self-pay

## 2016-03-02 ENCOUNTER — Ambulatory Visit: Payer: Self-pay | Attending: Internal Medicine

## 2016-03-11 ENCOUNTER — Ambulatory Visit: Payer: Self-pay | Admitting: Family Medicine

## 2016-04-05 ENCOUNTER — Encounter: Payer: Self-pay | Admitting: Family Medicine

## 2016-04-05 ENCOUNTER — Ambulatory Visit: Payer: Self-pay | Attending: Family Medicine | Admitting: Family Medicine

## 2016-04-05 VITALS — BP 122/78 | HR 87 | Temp 98.2°F | Ht 64.0 in | Wt 190.6 lb

## 2016-04-05 DIAGNOSIS — R350 Frequency of micturition: Secondary | ICD-10-CM | POA: Insufficient documentation

## 2016-04-05 DIAGNOSIS — F419 Anxiety disorder, unspecified: Secondary | ICD-10-CM | POA: Insufficient documentation

## 2016-04-05 DIAGNOSIS — F32A Depression, unspecified: Secondary | ICD-10-CM | POA: Insufficient documentation

## 2016-04-05 DIAGNOSIS — F329 Major depressive disorder, single episode, unspecified: Secondary | ICD-10-CM | POA: Insufficient documentation

## 2016-04-05 DIAGNOSIS — G4700 Insomnia, unspecified: Secondary | ICD-10-CM | POA: Insufficient documentation

## 2016-04-05 DIAGNOSIS — F418 Other specified anxiety disorders: Secondary | ICD-10-CM

## 2016-04-05 DIAGNOSIS — H539 Unspecified visual disturbance: Secondary | ICD-10-CM | POA: Insufficient documentation

## 2016-04-05 DIAGNOSIS — F1110 Opioid abuse, uncomplicated: Secondary | ICD-10-CM | POA: Insufficient documentation

## 2016-04-05 DIAGNOSIS — R140 Abdominal distension (gaseous): Secondary | ICD-10-CM | POA: Insufficient documentation

## 2016-04-05 LAB — POCT URINALYSIS DIPSTICK
BILIRUBIN UA: NEGATIVE
GLUCOSE UA: NEGATIVE
Ketones, UA: NEGATIVE
Leukocytes, UA: NEGATIVE
Nitrite, UA: NEGATIVE
Urobilinogen, UA: 0.2
pH, UA: 6

## 2016-04-05 MED ORDER — OMEPRAZOLE 40 MG PO CPDR: 40.0000 mg | DELAYED_RELEASE_CAPSULE | Freq: Every day | ORAL | 1 refills | Status: DC

## 2016-04-05 MED ORDER — HYDROXYZINE HCL 25 MG PO TABS: 25.0000 mg | ORAL_TABLET | Freq: Three times a day (TID) | ORAL | 1 refills | Status: DC | PRN

## 2016-04-05 NOTE — Progress Notes (Signed)
Diagnosed with a uti and wasn't treated- "shot and a couple of pills"  Was not able to fill prescription

## 2016-04-06 NOTE — Progress Notes (Signed)
Subjective:  Patient ID: Nancy Terry, female    DOB: 1976-04-20  Age: 40 y.o. MRN: [id number]  CC: Establish Care; referrals (dentist, urologist, mammogram); medication refills; Menstrual Problem; and Headache   HPI Nancy AbtsSusan Taves is a 40 year old female with a history of anxiety and depression who presents today to establish care.  She was previously followed by Dr. Sandria ManlyLove, a psychiatrist in [facility] until she lost her insurance and had been receiving Xanax along with other psychotropic medications which she is requesting refills off today. Complains of anxiety, panic attacks, depression and insomnia.  Attends the methadone clinic due to a history of opioid abuse.  Complains of visual disturbances and had been seen by ophthalmology-Dr.McFarlene, she has not seen him lately due to insurance issues.  She had an ED visit 4 months ago where she was diagnosed with UTI but was never able to afford the antibiotics and would like to be tested again today as she has had bilateral flank pain, nausea, urinary frequency but no dysuria or fever.  She also complains of abdominal bloating but no reflux, diarrhea or constipation.  Past Medical History:  Diagnosis Date  . Anxiety   . Depression   . Hepatitis C   . Herpes infection   . Kidney stones   . Migraines   . Narcotic abuse     Past Surgical History:  Procedure Laterality Date  . arm surgery    . left elbow surgery     . TUBAL LIGATION      Allergies  Allergen Reactions  . Haldol [Haloperidol Decanoate] Other (See Comments)    Hallucinations  . Nitrates, Organic Other (See Comments)    headaches     Outpatient Medications Prior to Visit  Medication Sig Dispense Refill  . ibuprofen (ADVIL,MOTRIN) 200 MG tablet Take 400 mg by mouth every 6 (six) hours as needed.    . methadone (DOLOPHINE) 10 MG/5ML solution Take 80 mg by mouth every other day.     Marland Kitchen. QUEtiapine (SEROQUEL) 100 MG tablet Take 100 mg by mouth 3 (three) times  daily.    Marland Kitchen. gabapentin (NEURONTIN) 600 MG tablet Take 1 tablet (600 mg total) by mouth 3 (three) times daily. (Patient not taking: Reported on 04/05/2016) 60 tablet 0  . metoCLOPramide (REGLAN) 10 MG tablet Take 1 tablet (10 mg total) by mouth every 6 (six) hours. (Patient not taking: Reported on 04/05/2016) 15 tablet 0  . sulfamethoxazole-trimethoprim (BACTRIM DS,SEPTRA DS) 800-160 MG tablet Take 1 tablet by mouth 2 (two) times daily. X 7 days (Patient not taking: Reported on 04/05/2016) 14 tablet 0  . hydrOXYzine (ATARAX/VISTARIL) 10 MG tablet Take 10 mg by mouth 3 (three) times daily as needed for anxiety.     No facility-administered medications prior to visit.     ROS Review of Systems  Constitutional: Negative for activity change, appetite change and fatigue.  HENT: Negative for congestion, sinus pressure and sore throat.   Eyes: Negative for visual disturbance.  Respiratory: Negative for cough, chest tightness, shortness of breath and wheezing.   Cardiovascular: Negative for chest pain and palpitations.  Gastrointestinal: Positive for nausea. Negative for abdominal distention, abdominal pain and constipation.  Endocrine: Negative for polydipsia.  Genitourinary: Positive for flank pain and frequency. Negative for dysuria.  Musculoskeletal: Negative for arthralgias and back pain.  Skin: Negative for rash.  Neurological: Negative for tremors, light-headedness and numbness.  Hematological: Does not bruise/bleed easily.  Psychiatric/Behavioral: Positive for sleep disturbance. Negative for agitation and behavioral problems.  Positive for anxiety    Objective:  BP 122/78 (BP Location: Right Arm, Patient Position: Sitting, Cuff Size: Small)   Pulse 87   Temp 98.2 F (36.8 C) (Oral)   Ht 5\' 4"  (1.626 m)   Wt 190 lb 9.6 oz (86.5 kg)   SpO2 99%   BMI 32.72 kg/m   BP/Weight 04/05/2016 11/30/2015 10/11/2015  Systolic BP 122 124 149  Diastolic BP 78 65 76  Wt. (Lbs) 190.6 - 180    BMI 32.72 - 29.07      Physical Exam  Constitutional: She is oriented to person, place, and time. She appears well-developed and well-nourished.  Neck: No JVD present.  Cardiovascular: Normal rate, normal heart sounds and intact distal pulses.   No murmur heard. Pulmonary/Chest: Effort normal and breath sounds normal. She has no wheezes. She has no rales. She exhibits no tenderness.  Abdominal: Soft. Bowel sounds are normal. She exhibits no distension and no mass. There is no tenderness.  Musculoskeletal: Normal range of motion.  Neurological: She is alert and oriented to person, place, and time.  Skin: Skin is warm and dry.  Psychiatric: She has a normal mood and affect.     Assessment & Plan:   1. Anxiety and depression I have had to repeat the management plan for today over and over again to the patient and she seems to forget the next time I walked back into the room. We'll place on Atarax and I have explained to high will be unable to put her on short acting benzodiazepines; hopefully Atarax will help with insomnia as well. Provided information to regional psychiatric associates in [facility] which will be more convenient for the patient distance wise. She is a high risk patient given history of opiate abuse in the past. - hydrOXYzine (ATARAX/VISTARIL) 25 MG tablet; Take 1 tablet (25 mg total) by mouth 3 (three) times daily as needed.  Dispense: 90 tablet; Refill: 1  2. Urinary frequency Urinalysis negative for UTI Advised to increase fluid intake No indication for antibiotic at this time - Urinalysis Dipstick  3. Vision disturbance Patient will be getting in touch with Dr. Linwood DibblesMcFarlene who is her ophthalmologist  4. Narcotic abuse Currently attending a methadone clinic  5. Abdominal bloating We'll treat presumptively for GERD - omeprazole (PRILOSEC) 40 MG capsule; Take 1 capsule (40 mg total) by mouth daily.  Dispense: 30 capsule; Refill: 1   Meds ordered this  encounter  Medications  . hydrOXYzine (ATARAX/VISTARIL) 25 MG tablet    Sig: Take 1 tablet (25 mg total) by mouth 3 (three) times daily as needed.    Dispense:  90 tablet    Refill:  1  . omeprazole (PRILOSEC) 40 MG capsule    Sig: Take 1 capsule (40 mg total) by mouth daily.    Dispense:  30 capsule    Refill:  1    Follow-up: Return in about 3 weeks (around 04/26/2016) for Complete physical exam.   Jaclyn ShaggyEnobong Amao MD

## 2016-04-12 ENCOUNTER — Telehealth: Payer: Self-pay | Admitting: Family Medicine

## 2016-04-12 DIAGNOSIS — F329 Major depressive disorder, single episode, unspecified: Secondary | ICD-10-CM

## 2016-04-12 DIAGNOSIS — R140 Abdominal distension (gaseous): Secondary | ICD-10-CM

## 2016-04-12 DIAGNOSIS — F419 Anxiety disorder, unspecified: Principal | ICD-10-CM

## 2016-04-12 MED ORDER — HYDROXYZINE HCL 25 MG PO TABS: 25.0000 mg | ORAL_TABLET | Freq: Three times a day (TID) | ORAL | 0 refills | Status: DC | PRN

## 2016-04-12 MED ORDER — OMEPRAZOLE 40 MG PO CPDR: 40.0000 mg | DELAYED_RELEASE_CAPSULE | Freq: Every day | ORAL | 0 refills | Status: DC

## 2016-04-12 NOTE — Telephone Encounter (Signed)
[facility] Omeprazole, hydroxyzine, alderal

## 2016-04-12 NOTE — Telephone Encounter (Signed)
[facility] and [facility]. Patient does not have Adderall on her medication list and even if she did, it cannot be [facility] - they do not fill CIIs.

## 2016-04-20 ENCOUNTER — Other Ambulatory Visit (HOSPITAL_COMMUNITY): Payer: Self-pay | Admitting: *Deleted

## 2016-04-20 DIAGNOSIS — N644 Mastodynia: Secondary | ICD-10-CM

## 2016-04-27 ENCOUNTER — Ambulatory Visit: Payer: Self-pay | Admitting: Family Medicine

## 2016-05-07 ENCOUNTER — Encounter: Payer: Self-pay | Admitting: Family Medicine

## 2016-05-07 ENCOUNTER — Ambulatory Visit: Payer: Self-pay | Attending: Family Medicine | Admitting: Family Medicine

## 2016-05-07 VITALS — BP 130/78 | HR 78 | Temp 98.7°F | Ht 64.0 in | Wt 189.6 lb

## 2016-05-07 DIAGNOSIS — G43909 Migraine, unspecified, not intractable, without status migrainosus: Secondary | ICD-10-CM | POA: Insufficient documentation

## 2016-05-07 DIAGNOSIS — F418 Other specified anxiety disorders: Secondary | ICD-10-CM

## 2016-05-07 DIAGNOSIS — G43809 Other migraine, not intractable, without status migrainosus: Secondary | ICD-10-CM

## 2016-05-07 DIAGNOSIS — F1110 Opioid abuse, uncomplicated: Secondary | ICD-10-CM | POA: Insufficient documentation

## 2016-05-07 DIAGNOSIS — K219 Gastro-esophageal reflux disease without esophagitis: Secondary | ICD-10-CM | POA: Insufficient documentation

## 2016-05-07 DIAGNOSIS — B182 Chronic viral hepatitis C: Secondary | ICD-10-CM

## 2016-05-07 DIAGNOSIS — X58XXXA Exposure to other specified factors, initial encounter: Secondary | ICD-10-CM | POA: Insufficient documentation

## 2016-05-07 DIAGNOSIS — S025XXA Fracture of tooth (traumatic), initial encounter for closed fracture: Secondary | ICD-10-CM | POA: Insufficient documentation

## 2016-05-07 DIAGNOSIS — Z13228 Encounter for screening for other metabolic disorders: Secondary | ICD-10-CM

## 2016-05-07 DIAGNOSIS — B1920 Unspecified viral hepatitis C without hepatic coma: Secondary | ICD-10-CM | POA: Insufficient documentation

## 2016-05-07 DIAGNOSIS — Z888 Allergy status to other drugs, medicaments and biological substances status: Secondary | ICD-10-CM | POA: Insufficient documentation

## 2016-05-07 DIAGNOSIS — Z9851 Tubal ligation status: Secondary | ICD-10-CM | POA: Insufficient documentation

## 2016-05-07 DIAGNOSIS — Z87442 Personal history of urinary calculi: Secondary | ICD-10-CM | POA: Insufficient documentation

## 2016-05-07 DIAGNOSIS — G4709 Other insomnia: Secondary | ICD-10-CM

## 2016-05-07 DIAGNOSIS — F319 Bipolar disorder, unspecified: Secondary | ICD-10-CM | POA: Insufficient documentation

## 2016-05-07 DIAGNOSIS — F419 Anxiety disorder, unspecified: Secondary | ICD-10-CM | POA: Insufficient documentation

## 2016-05-07 DIAGNOSIS — S025XXB Fracture of tooth (traumatic), initial encounter for open fracture: Secondary | ICD-10-CM

## 2016-05-07 DIAGNOSIS — Z79899 Other long term (current) drug therapy: Secondary | ICD-10-CM | POA: Insufficient documentation

## 2016-05-07 DIAGNOSIS — F329 Major depressive disorder, single episode, unspecified: Secondary | ICD-10-CM

## 2016-05-07 MED ORDER — HYDROXYZINE HCL 25 MG PO TABS: 25.0000 mg | ORAL_TABLET | Freq: Three times a day (TID) | ORAL | 2 refills | Status: DC | PRN

## 2016-05-07 MED ORDER — TRAZODONE HCL 100 MG PO TABS: 100.0000 mg | ORAL_TABLET | Freq: Every evening | ORAL | 0 refills | Status: DC | PRN

## 2016-05-07 MED ORDER — TOPIRAMATE 100 MG PO TABS: 100.0000 mg | ORAL_TABLET | Freq: Two times a day (BID) | ORAL | 2 refills | Status: DC

## 2016-05-07 NOTE — Progress Notes (Signed)
Subjective:  Patient ID: Nancy Terry, female    DOB: 1975-10-01  Age: 40 y.o. MRN: [id number]  CC: Annual Exam; Headache; Anxiety; Depression; and Manic Behavior   HPI Nancy Terry is a 40 year old female with a history of bipolar disorder, anxiety and depression, previous history of opioid abuse (currently on methadone), GERD who presents today to establish care.  She was previously followed by Dr. Sandria ManlyLove, a [place] [facility] until she lost her insurance and had been receiving Xanax along with other psychotropic medications. [facility] and currently receives therapy at the methadone clinic.  Complains of anxiety, panic attacks, depression and insomnia; I have placed her on hydroxyzine at her last office visit. Also complains of headaches which are frontal and retro-orbital and radiate to her posterior head and neck. She has received Imitrex in the past but currently does not take any medications for headaches. Headaches are worse with light and sound.  She requests referral to the dentist to have her eeth extracted. She would like to have blood work to refute or establish a diagnosis of hepatitis C which appears on her chart.   Past Medical History:  Diagnosis Date  . Anxiety   . Depression   . Hepatitis C   . Herpes infection   . Kidney stones   . Migraines   . Narcotic abuse     Past Surgical History:  Procedure Laterality Date  . arm surgery    . left elbow surgery     . TUBAL LIGATION      Allergies  Allergen Reactions  . Haldol [Haloperidol Decanoate] Other (See Comments)    Hallucinations  . Nitrates, Organic Other (See Comments)    headaches    Outpatient Medications Prior to Visit  Medication Sig Dispense Refill  . ibuprofen (ADVIL,MOTRIN) 200 MG tablet Take 400 mg by mouth every 6 (six) hours as needed.    . methadone (DOLOPHINE) 10 MG/5ML solution Take 80 mg by mouth every other day.      Marland Kitchen. omeprazole (PRILOSEC) 40 MG capsule Take 1 capsule (40 mg total) by mouth daily. 90 capsule 0  . hydrOXYzine (ATARAX/VISTARIL) 25 MG tablet Take 1 tablet (25 mg total) by mouth 3 (three) times daily as needed. 90 tablet 0  . ALPRAZolam (XANAX) 0.25 MG tablet Take 0.25 mg by mouth 2 (two) times daily as needed for anxiety.    . metoCLOPramide (REGLAN) 10 MG tablet Take 1 tablet (10 mg total) by mouth every 6 (six) hours. (Patient not taking: Reported on 05/07/2016) 15 tablet 0  . QUEtiapine (SEROQUEL) 100 MG tablet Take 100 mg by mouth 3 (three) times daily.    Marland Kitchen. gabapentin (NEURONTIN) 600 MG tablet Take 1 tablet (600 mg total) by mouth 3 (three) times daily. (Patient not taking: Reported on 05/07/2016) 60 tablet 0  . sulfamethoxazole-trimethoprim (BACTRIM DS,SEPTRA DS) 800-160 MG tablet Take 1 tablet by mouth 2 (two) times daily. X 7 days (Patient not taking: Reported on 04/05/2016) 14 tablet 0   No facility-administered medications prior to visit.     ROS Review of Systems  Constitutional: Negative for activity change, appetite change and fatigue.  HENT: Negative for congestion, sinus pressure and sore throat.   Eyes: Negative for visual disturbance.  Respiratory: Negative for cough, chest tightness, shortness of breath and wheezing.   Cardiovascular: Negative for chest pain and palpitations.  Gastrointestinal: Negative for abdominal distention, abdominal pain and constipation.  Endocrine:  Negative for polydipsia.  Genitourinary: Negative for dysuria and frequency.  Musculoskeletal: Negative for arthralgias and back pain.  Skin: Negative for rash.  Neurological: Positive for headaches. Negative for tremors, light-headedness and numbness.  Hematological: Does not bruise/bleed easily.  Psychiatric/Behavioral: Positive for sleep disturbance. Negative for agitation and behavioral problems.    Objective:  BP 130/78 (BP Location: Right Arm, Patient Position: Sitting, Cuff Size: Small)    Pulse 78   Temp 98.7 F (37.1 C) (Oral)   Ht 5\' 4"  (1.626 m)   Wt 189 lb 9.6 oz (86 kg)   LMP 04/23/2016 (Exact Date) Comment: bleed heavily, cramping, clots  SpO2 97%   BMI 32.54 kg/m   BP/Weight 05/07/2016 04/05/2016 11/30/2015  Systolic BP 130 122 124  Diastolic BP 78 78 65  Wt. (Lbs) 189.6 190.6 -  BMI 32.54 32.72 -     Physical Exam  Constitutional: She is oriented to person, place, and time. She appears well-developed and well-nourished.  HENT:  Right TM normal Left TM obscured by cerumen  Neck: No JVD present.  Cardiovascular: Normal rate, normal heart sounds and intact distal pulses.   No murmur heard. Pulmonary/Chest: Effort normal and breath sounds normal. She has no wheezes. She has no rales. She exhibits no tenderness.  Abdominal: Soft. Bowel sounds are normal. She exhibits no distension and no mass. There is no tenderness.  Musculoskeletal: Normal range of motion.  Neurological: She is alert and oriented to person, place, and time.     Assessment & Plan:   1. Anxiety and depression / Bipolar Has an upcoming appointment at [facility] for mental health care - hydrOXYzine (ATARAX/VISTARIL) 25 MG tablet; Take 1 tablet (25 mg total) by mouth 3 (three) times daily as needed.  Dispense: 90 tablet; Refill: 2  2. Other insomnia Comments trazodone Discussed sleep hygiene - traZODone (DESYREL) 100 MG tablet; Take 1 tablet (100 mg total) by mouth at bedtime as needed for sleep.  Dispense: 30 tablet; Refill: 0  3. Other migraine without status migrainosus, not intractable Commenced Topamax-side effects discussed (she is status post tubal ligation) - topiramate (TOPAMAX) 100 MG tablet; Take 1 tablet (100 mg total) by mouth 2 (two) times daily.  Dispense: 60 tablet; Refill: 2  4. Chronic hepatitis C without hepatic coma (HCC) - COMPLETE METABOLIC PANEL WITH GFR; Future - CBC with Differential/Platelet; Future - Hepatitis C antibody, reflex;  Future  5. Open fracture of tooth, initial encounter - Ambulatory referral to Dentistry  6. Screening for metabolic disorder - Lipid panel; Future - TSH; Future   Meds ordered this encounter  Medications  . topiramate (TOPAMAX) 100 MG tablet    Sig: Take 1 tablet (100 mg total) by mouth 2 (two) times daily.    Dispense:  60 tablet    Refill:  2  . hydrOXYzine (ATARAX/VISTARIL) 25 MG tablet    Sig: Take 1 tablet (25 mg total) by mouth 3 (three) times daily as needed.    Dispense:  90 tablet    Refill:  2  . traZODone (DESYREL) 100 MG tablet    Sig: Take 1 tablet (100 mg total) by mouth at bedtime as needed for sleep.    Dispense:  30 tablet    Refill:  0    Follow-up: Return in about 3 months (around 08/05/2016) for Follow-up of chronic medical conditions.   Jaclyn ShaggyEnobong Amao MD

## 2016-05-07 NOTE — Patient Instructions (Signed)
Migraine Headache A migraine headache is an intense, throbbing pain on one side or both sides of the head. Migraines may also cause other symptoms, such as nausea, vomiting, and sensitivity to light and noise. What are the causes? Doing or taking certain things may also trigger migraines, such as:  Alcohol.  Smoking.  Medicines, such as: ? Medicine used to treat chest pain (nitroglycerine). ? Birth control pills. ? Estrogen pills. ? Certain blood pressure medicines.  Aged cheeses, chocolate, or caffeine.  Foods or drinks that contain nitrates, glutamate, aspartame, or tyramine.  Physical activity.  Other things that may trigger a migraine include:  Menstruation.  Pregnancy.  Hunger.  Stress, lack of sleep, too much sleep, or fatigue.  Weather changes.  What increases the risk? The following factors may make you more likely to experience migraine headaches:  Age. Risk increases with age.  Family history of migraine headaches.  Being Caucasian.  Depression and anxiety.  Obesity.  Being a woman.  Having a hole in the heart (patent foramen ovale) or other heart problems.  What are the signs or symptoms? The main symptom of this condition is pulsating or throbbing pain. Pain may:  Happen in any area of the head, such as on one side or both sides.  Interfere with daily activities.  Get worse with physical activity.  Get worse with exposure to bright lights or loud noises.  Other symptoms may include:  Nausea.  Vomiting.  Dizziness.  General sensitivity to bright lights, loud noises, or smells.  Before you get a migraine, you may get warning signs that a migraine is developing (aura). An aura may include:  Seeing flashing lights or having blind spots.  Seeing bright spots, halos, or zigzag lines.  Having tunnel vision or blurred vision.  Having numbness or a tingling feeling.  Having trouble talking.  Having muscle weakness.  How is this  diagnosed? A migraine headache can be diagnosed based on:  Your symptoms.  A physical exam.  Tests, such as CT scan or MRI of the head. These imaging tests can help rule out other causes of headaches.  Taking fluid from the spine (lumbar puncture) and analyzing it (cerebrospinal fluid analysis, or CSF analysis).  How is this treated? A migraine headache is usually treated with medicines that:  Relieve pain.  Relieve nausea.  Prevent migraines from coming back.  Treatment may also include:  Acupuncture.  Lifestyle changes like avoiding foods that trigger migraines.  Follow these instructions at home: Medicines  Take over-the-counter and prescription medicines only as told by your health care provider.  Do not drive or use heavy machinery while taking prescription pain medicine.  To prevent or treat constipation while you are taking prescription pain medicine, your health care provider may recommend that you: ? Drink enough fluid to keep your urine clear or pale yellow. ? Take over-the-counter or prescription medicines. ? Eat foods that are high in fiber, such as fresh fruits and vegetables, whole grains, and beans. ? Limit foods that are high in fat and processed sugars, such as fried and sweet foods. Lifestyle  Avoid alcohol use.  Do not use any products that contain nicotine or tobacco, such as cigarettes and e-cigarettes. If you need help quitting, ask your health care provider.  Get at least 8 hours of sleep every night.  Limit your stress. General instructions   Keep a journal to find out what may trigger your migraine headaches. For example, write down: ? What you eat and   drink. ? How much sleep you get. ? Any change to your diet or medicines.  If you have a migraine: ? Avoid things that make your symptoms worse, such as bright lights. ? It may help to lie down in a dark, quiet room. ? Do not drive or use heavy machinery. ? Ask your health care provider  what activities are safe for you while you are experiencing symptoms.  Keep all follow-up visits as told by your health care provider. This is important. Contact a health care provider if:  You develop symptoms that are different or more severe than your usual migraine symptoms. Get help right away if:  Your migraine becomes severe.  You have a fever.  You have a stiff neck.  You have vision loss.  Your muscles feel weak or like you cannot control them.  You start to lose your balance often.  You develop trouble walking.  You faint. This information is not intended to replace advice given to you by your health care provider. Make sure you discuss any questions you have with your health care provider. Document Released: 05/24/2005 Document Revised: 12/12/2015 Document Reviewed: 11/10/2015 Elsevier Interactive Patient Education  2017 [employer]   

## 2016-05-07 NOTE — Progress Notes (Signed)
[facility] 05/13/16  Lost insurance and couldn't afford psych meds or therapy

## 2016-05-10 ENCOUNTER — Other Ambulatory Visit: Payer: Self-pay

## 2016-05-13 ENCOUNTER — Other Ambulatory Visit: Payer: Self-pay

## 2016-05-13 ENCOUNTER — Ambulatory Visit (HOSPITAL_COMMUNITY): Payer: Self-pay

## 2016-05-21 MED FILL — TOPIRAMATE 100 MG TABLET: 100 | 30 days supply | Qty: 60 | Fill #0

## 2016-05-21 MED FILL — ?HYDROXYZINE HCL 25 MG TAB: 25 | 30 days supply | Qty: 90 | Fill #0

## 2016-05-21 MED FILL — traZODone HCL 100 MG TABS: 100 | 30 days supply | Qty: 30 | Fill #0

## 2016-06-29 ENCOUNTER — Other Ambulatory Visit: Payer: Self-pay

## 2016-06-29 ENCOUNTER — Ambulatory Visit (HOSPITAL_COMMUNITY): Payer: Self-pay

## 2016-06-29 ENCOUNTER — Inpatient Hospital Stay: Admission: RE | Admit: 2016-06-29 | Payer: Self-pay | Source: Ambulatory Visit

## 2016-09-13 ENCOUNTER — Ambulatory Visit: Payer: Self-pay

## 2016-09-20 ENCOUNTER — Ambulatory Visit: Payer: Self-pay

## 2016-09-24 ENCOUNTER — Ambulatory Visit: Payer: Self-pay

## 2016-09-28 ENCOUNTER — Ambulatory Visit: Payer: Self-pay

## 2016-09-29 ENCOUNTER — Ambulatory Visit: Payer: Self-pay

## 2016-10-13 ENCOUNTER — Ambulatory Visit: Payer: Self-pay

## 2017-02-14 ENCOUNTER — Ambulatory Visit: Payer: Self-pay | Attending: Family Medicine

## 2017-02-14 ENCOUNTER — Other Ambulatory Visit: Payer: Self-pay | Admitting: Family Medicine

## 2017-02-14 DIAGNOSIS — G4709 Other insomnia: Secondary | ICD-10-CM

## 2017-02-14 MED FILL — OMEPRAZOLE DR 40 MG CAPSULE: 40 | 30 days supply | Qty: 30 | Fill #0

## 2017-02-14 MED FILL — TOPIRAMATE 100 MG TABLET: 100 | 30 days supply | Qty: 60 | Fill #1

## 2017-02-14 MED FILL — hydrOXYzine HCL 25 MG TABS: 25 | 30 days supply | Qty: 90 | Fill #1

## 2017-03-18 ENCOUNTER — Other Ambulatory Visit: Payer: Self-pay | Admitting: Obstetrics and Gynecology

## 2017-03-18 DIAGNOSIS — Z1231 Encounter for screening mammogram for malignant neoplasm of breast: Secondary | ICD-10-CM

## 2017-03-21 ENCOUNTER — Ambulatory Visit: Payer: Self-pay | Attending: Family Medicine | Admitting: Family Medicine

## 2017-03-21 ENCOUNTER — Telehealth: Payer: Self-pay | Admitting: Family Medicine

## 2017-03-21 ENCOUNTER — Encounter: Payer: Self-pay | Admitting: Family Medicine

## 2017-03-21 VITALS — BP 115/74 | HR 65 | Temp 97.9°F | Ht 64.0 in | Wt 203.8 lb

## 2017-03-21 DIAGNOSIS — Z87442 Personal history of urinary calculi: Secondary | ICD-10-CM | POA: Insufficient documentation

## 2017-03-21 DIAGNOSIS — F17200 Nicotine dependence, unspecified, uncomplicated: Secondary | ICD-10-CM | POA: Insufficient documentation

## 2017-03-21 DIAGNOSIS — F319 Bipolar disorder, unspecified: Secondary | ICD-10-CM | POA: Insufficient documentation

## 2017-03-21 DIAGNOSIS — Z972 Presence of dental prosthetic device (complete) (partial): Secondary | ICD-10-CM

## 2017-03-21 DIAGNOSIS — K0889 Other specified disorders of teeth and supporting structures: Secondary | ICD-10-CM

## 2017-03-21 DIAGNOSIS — N920 Excessive and frequent menstruation with regular cycle: Secondary | ICD-10-CM | POA: Insufficient documentation

## 2017-03-21 DIAGNOSIS — G43809 Other migraine, not intractable, without status migrainosus: Secondary | ICD-10-CM | POA: Insufficient documentation

## 2017-03-21 DIAGNOSIS — F1111 Opioid abuse, in remission: Secondary | ICD-10-CM | POA: Insufficient documentation

## 2017-03-21 DIAGNOSIS — G4700 Insomnia, unspecified: Secondary | ICD-10-CM | POA: Insufficient documentation

## 2017-03-21 DIAGNOSIS — F329 Major depressive disorder, single episode, unspecified: Secondary | ICD-10-CM

## 2017-03-21 DIAGNOSIS — Z720 Tobacco use: Secondary | ICD-10-CM

## 2017-03-21 DIAGNOSIS — N921 Excessive and frequent menstruation with irregular cycle: Secondary | ICD-10-CM

## 2017-03-21 DIAGNOSIS — H6122 Impacted cerumen, left ear: Secondary | ICD-10-CM

## 2017-03-21 DIAGNOSIS — F419 Anxiety disorder, unspecified: Secondary | ICD-10-CM | POA: Insufficient documentation

## 2017-03-21 DIAGNOSIS — Z13228 Encounter for screening for other metabolic disorders: Secondary | ICD-10-CM

## 2017-03-21 DIAGNOSIS — K219 Gastro-esophageal reflux disease without esophagitis: Secondary | ICD-10-CM | POA: Insufficient documentation

## 2017-03-21 DIAGNOSIS — M5431 Sciatica, right side: Secondary | ICD-10-CM | POA: Insufficient documentation

## 2017-03-21 DIAGNOSIS — F32A Depression, unspecified: Secondary | ICD-10-CM

## 2017-03-21 LAB — POCT URINALYSIS DIPSTICK
GLUCOSE UA: NEGATIVE
KETONES UA: NEGATIVE
Leukocytes, UA: NEGATIVE
NITRITE UA: NEGATIVE
Spec Grav, UA: 1.025 (ref 1.010–1.025)
Urobilinogen, UA: 1 E.U./dL
pH, UA: 5.5 (ref 5.0–8.0)

## 2017-03-21 MED ORDER — BUPROPION HCL ER (XL) 150 MG PO TB24: 150.0000 mg | ORAL_TABLET | Freq: Every day | ORAL | 3 refills | Status: DC

## 2017-03-21 MED ORDER — HYDROXYZINE HCL 25 MG PO TABS: 25.0000 mg | ORAL_TABLET | Freq: Three times a day (TID) | ORAL | 2 refills | Status: DC | PRN

## 2017-03-21 MED ORDER — TOPIRAMATE 100 MG PO TABS: 100.0000 mg | ORAL_TABLET | Freq: Two times a day (BID) | ORAL | 2 refills | Status: DC

## 2017-03-21 MED ORDER — CARBAMIDE PEROXIDE 6.5 % OT SOLN: 5.0000 [drp] | Freq: Two times a day (BID) | OTIC | 0 refills | Status: DC

## 2017-03-21 MED ORDER — METHOCARBAMOL 500 MG PO TABS: 500.0000 mg | ORAL_TABLET | Freq: Two times a day (BID) | ORAL | 3 refills | Status: DC | PRN

## 2017-03-21 MED FILL — hydrOXYzine HCL 25 MG TABS: 25 | 30 days supply | Qty: 90 | Fill #0 | Status: TO

## 2017-03-21 MED FILL — TOPIRAMATE 100 MG TABLET: 100 | 30 days supply | Qty: 60 | Fill #0 | Status: TO

## 2017-03-21 MED FILL — BUPROPION HCL XL 150 MG TAB: 150 | 30 days supply | Qty: 30 | Fill #0 | Status: TO

## 2017-03-21 MED FILL — METHOCARBAMOL 500 MG TABS: 500 | 30 days supply | Qty: 60 | Fill #0

## 2017-03-21 NOTE — Telephone Encounter (Signed)
Pt came to the office to drop papers for Dr. Venetia Night to be fill, papers will be in the inbox of the pcp

## 2017-03-21 NOTE — Progress Notes (Signed)
Subjective:  Patient ID: Nancy Terry, female    DOB: 1976-01-04  Age: 41 y.o. MRN: [id number]  CC: Back Pain   HPI Nancy Terry is a 41 year old female with a history of bipolar disorder, anxiety and depression, previous history of opioid abuse (currently on methadone), migraines, insomnia, GERD who presents today for a follow-up visit.  Her migraines occur about 3 times a week and she has used her Topamax only as needed for migraines rather than prophylactically. She never made it to [facility] [place] for follow-up of her bipolar disorder but informs me that she did well on hydroxyzine until she ran out. She stopped taking trazodone for insomnia as this caused weight gain.  She complains of irregular periods for the last 1 year with associated menorrhagia with passage of clots and is wondering if she is going through menopause as her mother did at the age of 35 Also complains of right flank pain which radiates down her posterior right leg which is described as sharp pain and intense. She does have a history of kidney stones and is wondering if she has new ones. Denies dysuria, abdominal pain and urinary frequency.  She is requesting a new referral to a dentist that she did not have the periumbilical. Requested at her visit.  Past Medical History:  Diagnosis Date  . Anxiety   . Depression   . Hepatitis C   . Herpes infection   . Kidney stones   . Migraines   . Narcotic abuse [facility])     Past Surgical History:  Procedure Laterality Date  . arm surgery    . left elbow surgery     . TUBAL LIGATION      Allergies  Allergen Reactions  . Haldol [Haloperidol Decanoate] Other (See Comments)    Hallucinations  . Nitrates, Organic Other (See Comments)    headaches     Outpatient Medications Prior to Visit  Medication Sig Dispense Refill  . ibuprofen (ADVIL,MOTRIN) 200 MG tablet Take 400 mg by mouth every 6 (six) hours as needed.    . methadone (DOLOPHINE) 10  MG/5ML solution Take 80 mg by mouth every other day.     . metoCLOPramide (REGLAN) 10 MG tablet Take 1 tablet (10 mg total) by mouth every 6 (six) hours. 15 tablet 0  . topiramate (TOPAMAX) 100 MG tablet Take 1 tablet (100 mg total) by mouth 2 (two) times daily. 60 tablet 2  . ALPRAZolam (XANAX) 0.25 MG tablet Take 0.25 mg by mouth 2 (two) times daily as needed for anxiety.    Marland Kitchen omeprazole (PRILOSEC) 40 MG capsule Take 1 capsule (40 mg total) by mouth daily. (Patient not taking: Reported on 03/21/2017) 90 capsule 0  . QUEtiapine (SEROQUEL) 100 MG tablet Take 100 mg by mouth 3 (three) times daily.    . traZODone (DESYREL) 100 MG tablet Take 1 tablet (100 mg total) by mouth at bedtime as needed for sleep. (Patient not taking: Reported on 03/21/2017) 30 tablet 0  . hydrOXYzine (ATARAX/VISTARIL) 25 MG tablet Take 1 tablet (25 mg total) by mouth 3 (three) times daily as needed. (Patient not taking: Reported on 03/21/2017) 90 tablet 2   No facility-administered medications prior to visit.     ROS Review of Systems  Constitutional: Negative for activity change, appetite change and fatigue.  HENT: Positive for dental problem. Negative for congestion, sinus pressure and sore throat.   Eyes: Negative for visual disturbance.  Respiratory: Negative for cough, chest tightness, shortness  of breath and wheezing.   Cardiovascular: Negative for chest pain and palpitations.  Gastrointestinal: Negative for abdominal distention, abdominal pain and constipation.  Endocrine: Negative for polydipsia.  Genitourinary: Positive for flank pain and menstrual problem. Negative for dysuria and frequency.  Musculoskeletal: Negative for arthralgias and back pain.  Skin: Negative for rash.  Neurological: Positive for headaches. Negative for tremors, light-headedness and numbness.  Hematological: Does not bruise/bleed easily.  Psychiatric/Behavioral: Negative for agitation and behavioral problems.    Objective:  BP  115/74   Pulse 65   Temp 97.9 F (36.6 C) (Oral)   Ht _0  (1.626 m)   Wt 203 lb 12.8 oz (92.4 kg)   SpO2 99%   BMI 34.98 kg/m   BP/Weight 03/21/2017 05/07/2016 00/34/9179  Systolic BP 150 569 794  Diastolic BP 74 78 78  Wt. (Lbs) 203.8 189.6 190.6  BMI 34.98 32.54 32.72      Physical Exam  Constitutional: She is oriented to person, place, and time. She appears well-developed and well-nourished.  HENT:  Cerumen obscuring left TM  Cardiovascular: Normal rate, normal heart sounds and intact distal pulses.   No murmur heard. Pulmonary/Chest: Effort normal and breath sounds normal. She has no wheezes. She has no rales. She exhibits no tenderness.  Abdominal: Soft. Bowel sounds are normal. She exhibits no distension and no mass. There is no tenderness.  Musculoskeletal: Normal range of motion.  Neurological: She is alert and oriented to person, place, and time.  Skin: Skin is warm and dry.  Psychiatric: She has a normal mood and affect.     Assessment & Plan:   1. Other migraine without status migrainosus, not intractable Not fully optimized as he has been Using Topamax when necessary - topiramate (TOPAMAX) 100 MG tablet; Take 1 tablet (100 mg total) by mouth 2 (two) times daily.  Dispense: 60 tablet; Refill: 2  2. Anxiety and depression Has been out of Hydroxyzine which I have refilled - hydrOXYzine (ATARAX/VISTARIL) 25 MG tablet; Take 1 tablet (25 mg total) by mouth 3 (three) times daily as needed.  Dispense: 90 tablet; Refill: 2  3. Tobacco abuse Smoking cessation support: smoking cessation hotline: 1-800-QUIT-NOW.  Smoking cessation classes are available through [facility]. Call [phone] or visit our website at [email or web address]  Spent 3 minutes counseling on smoking cessation and patient is ready to quit. - buPROPion (WELLBUTRIN XL) 150 MG 24 hr tablet; Take 1 tablet (150 mg total) by mouth daily.  Dispense: 60 tablet; Refill: 3  4.  History of kidney stones UA reveals trace blood  5. Screening for metabolic disorder - [id number]; Future - Lipid panel; Future - TSH; Future - T4, free; Future  6. Menorrhagia with irregular cycle - CBC with Differential/Platelet; Future - US Pelvis Complete; Future - US Transvaginal Non-OB; Future  7. Cerumen debris on tympanic membrane of left ear Ear irrigation performed  8. Sciatica of right side Advised to apply heat Demonstrated stretching exercises - methocarbamol (ROBAXIN) 500 MG tablet; Take 1 tablet (500 mg total) by mouth 2 (two) times daily as needed for muscle spasms.  Dispense: 60 tablet; Refill: 3   Meds ordered this encounter  Medications  . topiramate (TOPAMAX) 100 MG tablet    Sig: Take 1 tablet (100 mg total) by mouth 2 (two) times daily.    Dispense:  60 tablet    Refill:  2  . hydrOXYzine (ATARAX/VISTARIL) 25 MG tablet    Sig: Take 1 tablet (25 mg total)  by mouth 3 (three) times daily as needed.    Dispense:  90 tablet    Refill:  2  . buPROPion (WELLBUTRIN XL) 150 MG 24 hr tablet    Sig: Take 1 tablet (150 mg total) by mouth daily.    Dispense:  60 tablet    Refill:  3  . methocarbamol (ROBAXIN) 500 MG tablet    Sig: Take 1 tablet (500 mg total) by mouth 2 (two) times daily as needed for muscle spasms.    Dispense:  60 tablet    Refill:  3    Follow-up: Return in about 3 months (around 06/21/2017) for Follow-up of chronic medical conditions.   Arnoldo Morale MD

## 2017-03-21 NOTE — Patient Instructions (Signed)
Sciatica Sciatica is pain, numbness, weakness, or tingling along the path of the sciatic nerve. The sciatic nerve starts in the lower back and runs down the back of each leg. The nerve controls the muscles in the lower leg and in the back of the knee. It also provides feeling (sensation) to the back of the thigh, the lower leg, and the sole of the foot. Sciatica is a symptom of another medical condition that pinches or puts pressure on the sciatic nerve. Generally, sciatica only affects one side of the body. Sciatica usually goes away on its own or with treatment. In some cases, sciatica may keep coming back (recur). What are the causes? This condition is caused by pressure on the sciatic nerve, or pinching of the sciatic nerve. This may be the result of:  A disk in between the bones of the spine (vertebrae) bulging out too far (herniated disk).  Age-related changes in the spinal disks (degenerative disk disease).  A pain disorder that affects a muscle in the buttock (piriformis syndrome).  Extra bone growth (bone spur) near the sciatic nerve.  An injury or break (fracture) of the pelvis.  Pregnancy.  Tumor (rare). What increases the risk? The following factors may make you more likely to develop this condition:  Playing sports that place pressure or stress on the spine, such as football or weight lifting.  Having poor strength and flexibility.  A history of back injury.  A history of back surgery.  Sitting for long periods of time.  Doing activities that involve repetitive bending or lifting.  Obesity. What are the signs or symptoms? Symptoms can vary from mild to very severe, and they may include:  Any of these problems in the lower back, leg, hip, or buttock:  Mild tingling or dull aches.  Burning sensations.  Sharp pains.  Numbness in the back of the calf or the sole of the foot.  Leg weakness.  Severe back pain that makes movement difficult. These symptoms may  get worse when you cough, sneeze, or laugh, or when you sit or stand for long periods of time. Being overweight may also make symptoms worse. In some cases, symptoms may recur over time. How is this diagnosed? This condition may be diagnosed based on:  Your symptoms.  A physical exam. Your health care provider may ask you to do certain movements to check whether those movements trigger your symptoms.  You may have tests, including:  Blood tests.  X-rays.  MRI.  CT scan. How is this treated? In many cases, this condition improves on its own, without any treatment. However, treatment may include:  Reducing or modifying physical activity during periods of pain.  Exercising and stretching to strengthen your abdomen and improve the flexibility of your spine.  Icing and applying heat to the affected area.  Medicines that help:  To relieve pain and swelling.  To relax your muscles.  Injections of medicines that help to relieve pain, irritation, and inflammation around the sciatic nerve (steroids).  Surgery. Follow these instructions at home: Medicines   Take over-the-counter and prescription medicines only as told by your health care provider.  Do not drive or operate heavy machinery while taking prescription pain medicine. Managing pain   If directed, apply ice to the affected area.  Put ice in a plastic bag.  Place a towel between your skin and the bag.  Leave the ice on for 20 minutes, 2-3 times a day.  After icing, apply heat to the   affected area before you exercise or as often as told by your health care provider. Use the heat source that your health care provider recommends, such as a moist heat pack or a heating pad.  Place a towel between your skin and the heat source.  Leave the heat on for 20-30 minutes.  Remove the heat if your skin turns bright red. This is especially important if you are unable to feel pain, heat, or cold. You may have a greater risk of  getting burned. Activity   Return to your normal activities as told by your health care provider. Ask your health care provider what activities are safe for you.  Avoid activities that make your symptoms worse.  Take brief periods of rest throughout the day. Resting in a lying or standing position is usually better than sitting to rest.  When you rest for longer periods, mix in some mild activity or stretching between periods of rest. This will help to prevent stiffness and pain.  Avoid sitting for long periods of time without moving. Get up and move around at least one time each hour.  Exercise and stretch regularly, as told by your health care provider.  Do not lift anything that is heavier than 10 lb (4.5 kg) while you have symptoms of sciatica. When you do not have symptoms, you should still avoid heavy lifting, especially repetitive heavy lifting.  When you lift objects, always use proper lifting technique, which includes:  Bending your knees.  Keeping the load close to your body.  Avoiding twisting. General instructions   Use good posture.  Avoid leaning forward while sitting.  Avoid hunching over while standing.  Maintain a healthy weight. Excess weight puts extra stress on your back and makes it difficult to maintain good posture.  Wear supportive, comfortable shoes. Avoid wearing high heels.  Avoid sleeping on a mattress that is too soft or too hard. A mattress that is firm enough to support your back when you sleep may help to reduce your pain.  Keep all follow-up visits as told by your health care provider. This is important. Contact a health care provider if:  You have pain that wakes you up when you are sleeping.  You have pain that gets worse when you lie down.  Your pain is worse than you have experienced in the past.  Your pain lasts longer than 4 weeks.  You experience unexplained weight loss. Get help right away if:  You lose control of your bowel  or bladder (incontinence).  You have:  Weakness in your lower back, pelvis, buttocks, or legs that gets worse.  Redness or swelling of your back.  A burning sensation when you urinate. This information is not intended to replace advice given to you by your health care provider. Make sure you discuss any questions you have with your health care provider. Document Released: 05/18/2001 Document Revised: 10/28/2015 Document Reviewed: 01/31/2015 Elsevier Interactive Patient Education  2017 [employer]  

## 2017-03-22 ENCOUNTER — Ambulatory Visit: Payer: Self-pay | Attending: Family Medicine

## 2017-03-22 DIAGNOSIS — Z13228 Encounter for screening for other metabolic disorders: Secondary | ICD-10-CM | POA: Insufficient documentation

## 2017-03-22 DIAGNOSIS — N921 Excessive and frequent menstruation with irregular cycle: Secondary | ICD-10-CM | POA: Insufficient documentation

## 2017-03-22 NOTE — Telephone Encounter (Signed)
Noted  

## 2017-03-22 NOTE — Progress Notes (Signed)
Patient here for lab visit  

## 2017-03-23 ENCOUNTER — Telehealth: Payer: Self-pay | Admitting: Family Medicine

## 2017-03-23 DIAGNOSIS — M5431 Sciatica, right side: Secondary | ICD-10-CM

## 2017-03-23 LAB — CMP14+EGFR
ALBUMIN: 4 g/dL (ref 3.5–5.5)
ALT: 15 IU/L (ref 0–32)
AST: 24 IU/L (ref 0–40)
Albumin/Globulin Ratio: 1.1 — ABNORMAL LOW (ref 1.2–2.2)
Alkaline Phosphatase: 85 IU/L (ref 39–117)
BUN / CREAT RATIO: 13 (ref 9–23)
BUN: 10 mg/dL (ref 6–24)
Bilirubin Total: 0.4 mg/dL (ref 0.0–1.2)
CALCIUM: 9 mg/dL (ref 8.7–10.2)
CO2: 15 mmol/L — AB (ref 20–29)
CREATININE: 0.79 mg/dL (ref 0.57–1.00)
Chloride: 108 mmol/L — ABNORMAL HIGH (ref 96–106)
GFR calc Af Amer: 107 mL/min/{1.73_m2} (ref >59)
GFR, EST NON AFRICAN AMERICAN: 93 mL/min/{1.73_m2} (ref >59)
GLOBULIN, TOTAL: 3.6 g/dL (ref 1.5–4.5)
Glucose: 84 mg/dL (ref 65–99)
Potassium: 4.3 mmol/L (ref 3.5–5.2)
SODIUM: 139 mmol/L (ref 134–144)
Total Protein: 7.6 g/dL (ref 6.0–8.5)

## 2017-03-23 LAB — LIPID PANEL
CHOL/HDL RATIO: 4.3 ratio (ref 0.0–4.4)
Cholesterol, Total: 102 mg/dL (ref 100–199)
HDL: 24 mg/dL — ABNORMAL LOW (ref >39)
LDL CALC: 52 mg/dL (ref 0–99)
TRIGLYCERIDES: 130 mg/dL (ref 0–149)
VLDL CHOLESTEROL CAL: 26 mg/dL (ref 5–40)

## 2017-03-23 LAB — CBC WITH DIFFERENTIAL/PLATELET

## 2017-03-23 LAB — TSH: TSH: 1.09 u[IU]/mL (ref 0.450–4.500)

## 2017-03-23 LAB — T4, FREE: FREE T4: 1.16 ng/dL (ref 0.82–1.77)

## 2017-03-23 NOTE — Telephone Encounter (Signed)
Patient called stating her back has been hurting a lot more than during her visit. Pt states the ibuprofen has not been helping

## 2017-03-24 ENCOUNTER — Ambulatory Visit (HOSPITAL_COMMUNITY)
Admission: RE | Admit: 2017-03-24 | Discharge: 2017-03-24 | Disposition: A | Discharge status: Home / Self Care | Payer: Medicaid Other | Source: Ambulatory Visit | Attending: Family Medicine | Admitting: Family Medicine

## 2017-03-24 DIAGNOSIS — N83202 Unspecified ovarian cyst, left side: Secondary | ICD-10-CM | POA: Insufficient documentation

## 2017-03-24 DIAGNOSIS — N920 Excessive and frequent menstruation with regular cycle: Secondary | ICD-10-CM | POA: Insufficient documentation

## 2017-03-24 DIAGNOSIS — N921 Excessive and frequent menstruation with irregular cycle: Secondary | ICD-10-CM | POA: Insufficient documentation

## 2017-03-24 DIAGNOSIS — N83201 Unspecified ovarian cyst, right side: Secondary | ICD-10-CM | POA: Insufficient documentation

## 2017-03-24 DIAGNOSIS — R9389 Abnormal findings on diagnostic imaging of other specified body structures: Secondary | ICD-10-CM | POA: Insufficient documentation

## 2017-03-25 ENCOUNTER — Telehealth: Payer: Self-pay

## 2017-03-25 MED ORDER — METHOCARBAMOL 500 MG PO TABS: 1000.0000 mg | ORAL_TABLET | Freq: Two times a day (BID) | ORAL | 3 refills | Status: DC | PRN

## 2017-03-25 MED ORDER — MEDROXYPROGESTERONE ACETATE 10 MG PO TABS: 10.0000 mg | ORAL_TABLET | Freq: Every day | ORAL | 0 refills | Status: DC

## 2017-03-25 NOTE — Telephone Encounter (Signed)
Pt was called and informed of lab results. Pt would like to have the provera pills for her bleeding.

## 2017-03-25 NOTE — Telephone Encounter (Signed)
Increase Robaxin to 2 tablets twice daily

## 2017-03-25 NOTE — Telephone Encounter (Signed)
Done

## 2017-03-25 NOTE — Telephone Encounter (Signed)
Pt was called and informed of medication change. 

## 2017-03-25 NOTE — Telephone Encounter (Signed)
Will route to PCP 

## 2017-03-25 NOTE — Telephone Encounter (Signed)
Pt contacted the office and is requesting something for pain and is wondering what does she need to do to see if she has kidney stones. Please f/u

## 2017-03-31 ENCOUNTER — Ambulatory Visit (HOSPITAL_COMMUNITY): Payer: Medicaid Other

## 2017-04-05 ENCOUNTER — Telehealth: Payer: Self-pay | Admitting: Family Medicine

## 2017-04-05 NOTE — Telephone Encounter (Signed)
PT called to disclose personal information,PT Preferred contact is [phone] (Asked to Let whoever answers that its a doctors office) Please follow up.

## 2017-05-13 ENCOUNTER — Encounter: Payer: Self-pay | Admitting: Family Medicine

## 2017-05-13 ENCOUNTER — Ambulatory Visit: Payer: Self-pay | Attending: Family Medicine | Admitting: Family Medicine

## 2017-05-13 VITALS — BP 122/69 | HR 70 | Temp 98.2°F | Ht 64.0 in | Wt 213.2 lb

## 2017-05-13 DIAGNOSIS — Z8619 Personal history of other infectious and parasitic diseases: Secondary | ICD-10-CM | POA: Insufficient documentation

## 2017-05-13 DIAGNOSIS — Z888 Allergy status to other drugs, medicaments and biological substances status: Secondary | ICD-10-CM | POA: Insufficient documentation

## 2017-05-13 DIAGNOSIS — M5431 Sciatica, right side: Secondary | ICD-10-CM | POA: Insufficient documentation

## 2017-05-13 DIAGNOSIS — M5430 Sciatica, unspecified side: Secondary | ICD-10-CM | POA: Insufficient documentation

## 2017-05-13 DIAGNOSIS — M5432 Sciatica, left side: Secondary | ICD-10-CM | POA: Insufficient documentation

## 2017-05-13 DIAGNOSIS — N920 Excessive and frequent menstruation with regular cycle: Secondary | ICD-10-CM | POA: Insufficient documentation

## 2017-05-13 DIAGNOSIS — Z87442 Personal history of urinary calculi: Secondary | ICD-10-CM | POA: Insufficient documentation

## 2017-05-13 DIAGNOSIS — F419 Anxiety disorder, unspecified: Secondary | ICD-10-CM | POA: Insufficient documentation

## 2017-05-13 DIAGNOSIS — Z79899 Other long term (current) drug therapy: Secondary | ICD-10-CM | POA: Insufficient documentation

## 2017-05-13 DIAGNOSIS — K219 Gastro-esophageal reflux disease without esophagitis: Secondary | ICD-10-CM | POA: Insufficient documentation

## 2017-05-13 DIAGNOSIS — G4700 Insomnia, unspecified: Secondary | ICD-10-CM | POA: Insufficient documentation

## 2017-05-13 DIAGNOSIS — Z23 Encounter for immunization: Secondary | ICD-10-CM | POA: Insufficient documentation

## 2017-05-13 DIAGNOSIS — R200 Anesthesia of skin: Secondary | ICD-10-CM | POA: Insufficient documentation

## 2017-05-13 DIAGNOSIS — F319 Bipolar disorder, unspecified: Secondary | ICD-10-CM | POA: Insufficient documentation

## 2017-05-13 NOTE — Progress Notes (Signed)
Subjective:  Patient ID: Nancy Terry [place], female    DOB: Feb 21, 1976  Age: 41 y.o. MRN: [id number]  CC: Hip Pain   HPI Nancy Terry Wedin is a 41 year old female with a history of bipolar disorder, anxiety and depression, previous history of opioid abuse (currently on methadone), migraines, insomnia, GERD who presents today for an acute visit complaining of right hip pain, right thigh numbness which prevents her from sitting.  Pain is described as severe and relieved by standing. At her last visit she was treated for left sided sciatica and I had placed her on Robaxin which she is not taking as 'she does not like to take medications.' Left sciatica has improved.  She also complained of menorrhagia which lasts for 6-7 days with a normal cycle length and Provera had been prescribed which she never took as she states she did not do well on Depo Provera in the past.  Pelvic Ultrasound 03/24/17: IMPRESSION: 1. Endometrial thickening at 16 mm. If bleeding remains unresponsive to hormonal or medical therapy, focal lesion work-up with sonohysterogram should be considered. Endometrial biopsy should also be considered in pre-menopausal patients at high risk for endometrial carcinoma. (Ref: Radiological Reasoning: Algorithmic Workup of Abnormal Vaginal Bleeding with Endovaginal Sonography and Sonohysterography. AJR 2008; 409:[id number])  2. Bilateral ovarian complex cysts. Short-interval follow up ultrasound in 6-12 weeks is recommended, preferably during the week following the patient's normal menses.  She is requesting a referral to OBGYN.  Past Medical History:  Diagnosis Date  . Anxiety   . Depression   . Hepatitis C   . Herpes infection   . Kidney stones   . Migraines   . Narcotic abuse [facility])     Past Surgical History:  Procedure Laterality Date  . arm surgery    . left elbow surgery     . TUBAL LIGATION      Allergies  Allergen Reactions  . Haldol [Haloperidol Decanoate] Other (See  Comments)    Hallucinations  . Nitrates, Organic Other (See Comments)    headaches     Outpatient Medications Prior to Visit  Medication Sig Dispense Refill  . hydrOXYzine (ATARAX/VISTARIL) 25 MG tablet Take 1 tablet (25 mg total) by mouth 3 (three) times daily as needed. 90 tablet 2  . ibuprofen (ADVIL,MOTRIN) 200 MG tablet Take 400 mg by mouth every 6 (six) hours as needed.    . ALPRAZolam (XANAX) 0.25 MG tablet Take 0.25 mg by mouth 2 (two) times daily as needed for anxiety.    Marland Kitchen. buPROPion (WELLBUTRIN XL) 150 MG 24 hr tablet Take 1 tablet (150 mg total) by mouth daily. (Patient not taking: Reported on 05/13/2017) 60 tablet 3  . carbamide peroxide (DEBROX) 6.5 % OTIC solution Place 5 drops into the left ear 2 (two) times daily. (Patient not taking: Reported on 05/13/2017) 15 mL 0  . medroxyPROGESTERone (PROVERA) 10 MG tablet Take 1 tablet (10 mg total) by mouth daily. (Patient not taking: Reported on 05/13/2017) 10 tablet 0  . methadone (DOLOPHINE) 10 MG/5ML solution Take 80 mg by mouth every other day.     . methocarbamol (ROBAXIN) 500 MG tablet Take 2 tablets (1,000 mg total) by mouth 2 (two) times daily as needed for muscle spasms. (Patient not taking: Reported on 05/13/2017) 120 tablet 3  . metoCLOPramide (REGLAN) 10 MG tablet Take 1 tablet (10 mg total) by mouth every 6 (six) hours. (Patient not taking: Reported on 05/13/2017) 15 tablet 0  . omeprazole (PRILOSEC) 40 MG capsule Take 1 capsule (  40 mg total) by mouth daily. (Patient not taking: Reported on 03/21/2017) 90 capsule 0  . QUEtiapine (SEROQUEL) 100 MG tablet Take 100 mg by mouth 3 (three) times daily.    Marland Kitchen. topiramate (TOPAMAX) 100 MG tablet Take 1 tablet (100 mg total) by mouth 2 (two) times daily. (Patient not taking: Reported on 05/13/2017) 60 tablet 2  . traZODone (DESYREL) 100 MG tablet Take 1 tablet (100 mg total) by mouth at bedtime as needed for sleep. (Patient not taking: Reported on 03/21/2017) 30 tablet 0   No  facility-administered medications prior to visit.     ROS Review of Systems  Constitutional: Negative for activity change, appetite change and fatigue.  HENT: Negative for congestion, sinus pressure and sore throat.   Eyes: Negative for visual disturbance.  Respiratory: Negative for cough, chest tightness, shortness of breath and wheezing.   Cardiovascular: Negative for chest pain and palpitations.  Gastrointestinal: Negative for abdominal distention, abdominal pain and constipation.  Endocrine: Negative for polydipsia.  Genitourinary: Positive for menstrual problem. Negative for dysuria and frequency.  Musculoskeletal:       See hpi  Skin: Negative for rash.  Neurological: Positive for numbness. Negative for tremors and light-headedness.  Hematological: Does not bruise/bleed easily.  Psychiatric/Behavioral: Negative for agitation and behavioral problems.    Objective:  BP 122/69   Pulse 70   Temp 98.2 F (36.8 C) (Oral)   Ht 5\' 4"  (1.626 m)   Wt 213 lb 3.2 oz (96.7 kg)   LMP 04/18/2017   SpO2 96%   BMI 36.60 kg/m   BP/Weight 05/13/2017 03/21/2017 05/07/2016  Systolic BP 122 115 130  Diastolic BP 69 74 78  Wt. (Lbs) 213.2 203.8 189.6  BMI 36.6 34.98 32.54      Physical Exam  Constitutional: She is oriented to person, place, and time. She appears well-developed and well-nourished.  Cardiovascular: Normal rate, normal heart sounds and intact distal pulses.  No murmur heard. Pulmonary/Chest: Effort normal and breath sounds normal. She has no wheezes. She has no rales. She exhibits no tenderness.  Abdominal: Soft. Bowel sounds are normal. She exhibits no distension and no mass. There is no tenderness.  Musculoskeletal: Normal range of motion.  Positive straight leg raise on the right Tenderness on palpation of right lumbar region   Neurological: She is alert and oriented to person, place, and time.     Assessment & Plan:   1. Bilateral sciatica Initially left  sciatica and now with right sciatica as well She is refusing initiation of Gabapentin because 'it increased her migraines' Declines Cymbalta If PT does not help [facility]  2. Menorrhagia with regular cycle Declines Provera; would like to see GYN She has bilateral complex cysts, ultrasound in 6-12 weeks recommended - Ambulatory referral to Gynecology  3. Need for influenza vaccination - Flu Vaccine QUAD 36+ mos IM   No orders of the defined types were placed in this encounter.   Follow-up: Return for Follow-up of sciatica, keep previously scheduled appointment.   Jaclyn ShaggyEnobong Amao MD

## 2017-05-13 NOTE — Patient Instructions (Signed)
Sciatica Sciatica is pain, numbness, weakness, or tingling along your sciatic nerve. The sciatic nerve starts in the lower back and goes down the back of each leg. Sciatica happens when this nerve is pinched or has pressure put on it. Sciatica usually goes away on its own or with treatment. Sometimes, sciatica may keep coming back (recur). Follow these instructions at home: Medicines  Take over-the-counter and prescription medicines only as told by your doctor.  Do not drive or use heavy machinery while taking prescription pain medicine. Managing pain  If directed, put ice on the affected area. ? Put ice in a plastic bag. ? Place a towel between your skin and the bag. ? Leave the ice on for 20 minutes, 2-3 times a day.  After icing, apply heat to the affected area before you exercise or as often as told by your doctor. Use the heat source that your doctor tells you to use, such as a moist heat pack or a heating pad. ? Place a towel between your skin and the heat source. ? Leave the heat on for 20-30 minutes. ? Remove the heat if your skin turns bright red. This is especially important if you are unable to feel pain, heat, or cold. You may have a greater risk of getting burned. Activity  Return to your normal activities as told by your doctor. Ask your doctor what activities are safe for you. ? Avoid activities that make your sciatica worse.  Take short rests during the day. Rest in a lying or standing position. This is usually better than sitting to rest. ? When you rest for a long time, do some physical activity or stretching between periods of rest. ? Avoid sitting for a long time without moving. Get up and move around at least one time each hour.  Exercise and stretch regularly, as told by your doctor.  Do not lift anything that is heavier than 10 lb (4.5 kg) while you have symptoms of sciatica. ? Avoid lifting heavy things even when you do not have symptoms. ? Avoid lifting heavy  things over and over.  When you lift objects, always lift in a way that is safe for your body. To do this, you should: ? Bend your knees. ? Keep the object close to your body. ? Avoid twisting. General instructions  Use good posture. ? Avoid leaning forward when you are sitting. ? Avoid hunching over when you are standing.  Stay at a healthy weight.  Wear comfortable shoes that support your feet. Avoid wearing high heels.  Avoid sleeping on a mattress that is too soft or too hard. You might have less pain if you sleep on a mattress that is firm enough to support your back.  Keep all follow-up visits as told by your doctor. This is important. Contact a doctor if:  You have pain that: ? Wakes you up when you are sleeping. ? Gets worse when you lie down. ? Is worse than the pain you have had in the past. ? Lasts longer than 4 weeks.  You lose weight for without trying. Get help right away if:  You cannot control when you pee (urinate) or poop (have a bowel movement).  You have weakness in any of these areas and it gets worse. ? Lower back. ? Lower belly (pelvis). ? Butt (buttocks). ? Legs.  You have redness or swelling of your back.  You have a burning feeling when you pee. This information is not intended to replace   advice given to you by your health care provider. Make sure you discuss any questions you have with your health care provider. Document Released: 03/02/2008 Document Revised: 10/30/2015 Document Reviewed: 01/31/2015 Elsevier Interactive Patient Education  2018 Elsevier Inc.  

## 2017-05-17 ENCOUNTER — Encounter: Payer: Self-pay | Admitting: Family Medicine

## 2017-05-19 ENCOUNTER — Ambulatory Visit (HOSPITAL_COMMUNITY): Payer: Medicaid Other

## 2017-05-26 ENCOUNTER — Telehealth: Payer: Self-pay | Admitting: Physical Therapy

## 2017-05-26 NOTE — Telephone Encounter (Signed)
05/26/17 pt has [facility] card which does not cover PT.

## 2017-06-15 ENCOUNTER — Ambulatory Visit: Payer: Medicaid Other | Admitting: Family Medicine

## 2017-06-23 ENCOUNTER — Encounter: Payer: Self-pay | Admitting: Obstetrics and Gynecology

## 2017-06-23 NOTE — Progress Notes (Signed)
Patient did not keep GYN referral appointment for 06/23/2017.  Nancy Terry, Jr MD Attending Center for [facility](Faculty Practice)

## 2017-06-28 ENCOUNTER — Ambulatory Visit: Payer: Self-pay | Admitting: Family Medicine

## 2017-07-04 ENCOUNTER — Telehealth: Payer: Self-pay | Admitting: Family Medicine

## 2017-07-04 NOTE — Telephone Encounter (Signed)
She was previously prescribed Provera which she declined.  I then referred her to GYN and she missed her appointment on 06/23/17 and would need to call the [facility] to reschedule that next appointment for management of her menorrhagia. She is currently not on Ativan.

## 2017-07-04 NOTE — Telephone Encounter (Signed)
Pt. Called requesting a refill on Ativan. Told pt. That I did not see the medication on her current medication list.  Pt. Also stated that her PCP was going to prescribe her birth control pills for her menstrual cycle. Pt. Would like Rx sent [place] [facility] [address] Please f/u.

## 2017-07-04 NOTE — Telephone Encounter (Signed)
Will route to PCP 

## 2017-07-05 ENCOUNTER — Other Ambulatory Visit: Payer: Self-pay | Admitting: Family Medicine

## 2017-07-05 DIAGNOSIS — F419 Anxiety disorder, unspecified: Principal | ICD-10-CM

## 2017-07-05 DIAGNOSIS — F329 Major depressive disorder, single episode, unspecified: Secondary | ICD-10-CM

## 2017-07-06 NOTE — Telephone Encounter (Signed)
Patient was called and patient states that she was already informed of dr.Newlins response.

## 2017-07-11 ENCOUNTER — Telehealth (HOSPITAL_COMMUNITY): Payer: Self-pay

## 2017-07-11 NOTE — Telephone Encounter (Signed)
Patient called to schedule an appointment with [facility] for mammo and pap. Advised patient that since she had no-showed for her previous two appointments that this would be the last time we can schedule her with [facility]. Patient voiced understanding and said that she would be at her appointment in March.

## 2017-08-26 ENCOUNTER — Telehealth: Payer: Self-pay | Admitting: Family Medicine

## 2017-08-26 NOTE — Telephone Encounter (Signed)
5 page, paperwork reciebed through fax 08-26-17.

## 2017-09-01 ENCOUNTER — Ambulatory Visit (HOSPITAL_COMMUNITY): Payer: Self-pay

## 2017-09-12 ENCOUNTER — Ambulatory Visit: Payer: Self-pay | Attending: Family Medicine

## 2017-09-19 ENCOUNTER — Ambulatory Visit: Payer: Self-pay | Admitting: Family Medicine

## 2017-10-20 ENCOUNTER — Ambulatory Visit (HOSPITAL_COMMUNITY)
Admission: EM | Admit: 2017-10-20 | Discharge: 2017-10-20 | Disposition: A | Discharge status: Home / Self Care | Payer: Medicaid Other | Attending: Family Medicine | Admitting: Family Medicine

## 2017-10-20 ENCOUNTER — Encounter (HOSPITAL_COMMUNITY): Payer: Self-pay | Admitting: Emergency Medicine

## 2017-10-20 ENCOUNTER — Other Ambulatory Visit: Payer: Self-pay

## 2017-10-20 DIAGNOSIS — L02411 Cutaneous abscess of right axilla: Secondary | ICD-10-CM

## 2017-10-20 DIAGNOSIS — R110 Nausea: Secondary | ICD-10-CM

## 2017-10-20 DIAGNOSIS — L0291 Cutaneous abscess, unspecified: Secondary | ICD-10-CM

## 2017-10-20 MED ORDER — DOXYCYCLINE HYCLATE 100 MG PO CAPS: 100.0000 mg | ORAL_CAPSULE | Freq: Two times a day (BID) | ORAL | 0 refills | Status: DC

## 2017-10-20 MED ORDER — ONDANSETRON HCL 4 MG PO TABS: 4.0000 mg | ORAL_TABLET | Freq: Four times a day (QID) | ORAL | 0 refills | Status: DC

## 2017-10-20 NOTE — Discharge Instructions (Addendum)
Prescribed doxycycline Prescribed zofran for nausea Continue to take OTC medications as needed for pain relief Hot shower and warm compresses as needed for symptomatic relief Take as prescribed and to completion Follow up with PCP if symptoms persists Return or go to the ER if you have any new or worsening symptoms

## 2017-10-20 NOTE — ED Provider Notes (Signed)
[facility]   [id number] 10/20/17 Arrival Time: 1356  SUBJECTIVE:  Nancy Terry is a 42 y.o. female who presents with a skin complaint.  Patient also complains of abscesses that began on 1 month.  Localizes under right axilla.   Describes it as painful and draining.  Has tried hot shower with relief.  Symptoms are made worse with clothing and sweating.  Denies similar symptoms in the past.   Complains of associated subjective fever, and nausea.  Denies chills, vomiting, SOB, chest pain, abdominal pain, changes in bowel or bladder function.    ROS: As per HPI.  Past Medical History:  Diagnosis Date  . Anxiety   . Depression   . Hepatitis C   . Herpes infection   . Kidney stones   . Migraines   . Narcotic abuse [facility])    Past Surgical History:  Procedure Laterality Date  . arm surgery    . left elbow surgery     . TUBAL LIGATION     Allergies  Allergen Reactions  . Haldol [Haloperidol Decanoate] Other (See Comments)    Hallucinations  . Nitrates, Organic Other (See Comments)    headaches   No current facility-administered medications on file prior to encounter.    Current Outpatient Medications on File Prior to Encounter  Medication Sig Dispense Refill  . hydrOXYzine (ATARAX/VISTARIL) 25 MG tablet Take 1 tablet (25 mg total) by mouth 3 (three) times daily as needed. 90 tablet 2  . methadone (DOLOPHINE) 10 MG/5ML solution Take 80 mg by mouth every other day.     . ALPRAZolam (XANAX) 0.25 MG tablet Take 0.25 mg by mouth 2 (two) times daily as needed for anxiety.    Marland Kitchen buPROPion (WELLBUTRIN XL) 150 MG 24 hr tablet Take 1 tablet (150 mg total) by mouth daily. (Patient not taking: Reported on 05/13/2017) 60 tablet 3  . carbamide peroxide (DEBROX) 6.5 % OTIC solution Place 5 drops into the left ear 2 (two) times daily. (Patient not taking: Reported on 05/13/2017) 15 mL 0  . hydrOXYzine (ATARAX/VISTARIL) 25 MG tablet TAKE ONE TABLET BY MOUTH THREE TIMES A DAY AS NEEDED 60  tablet 1  . ibuprofen (ADVIL,MOTRIN) 200 MG tablet Take 400 mg by mouth every 6 (six) hours as needed.    . medroxyPROGESTERone (PROVERA) 10 MG tablet Take 1 tablet (10 mg total) by mouth daily. (Patient not taking: Reported on 05/13/2017) 10 tablet 0  . methocarbamol (ROBAXIN) 500 MG tablet Take 2 tablets (1,000 mg total) by mouth 2 (two) times daily as needed for muscle spasms. (Patient not taking: Reported on 05/13/2017) 120 tablet 3  . metoCLOPramide (REGLAN) 10 MG tablet Take 1 tablet (10 mg total) by mouth every 6 (six) hours. (Patient not taking: Reported on 05/13/2017) 15 tablet 0  . omeprazole (PRILOSEC) 40 MG capsule Take 1 capsule (40 mg total) by mouth daily. (Patient not taking: Reported on 03/21/2017) 90 capsule 0  . QUEtiapine (SEROQUEL) 100 MG tablet Take 100 mg by mouth 3 (three) times daily.    Marland Kitchen topiramate (TOPAMAX) 100 MG tablet Take 1 tablet (100 mg total) by mouth 2 (two) times daily. (Patient not taking: Reported on 05/13/2017) 60 tablet 2  . traZODone (DESYREL) 100 MG tablet Take 1 tablet (100 mg total) by mouth at bedtime as needed for sleep. (Patient not taking: Reported on 03/21/2017) 30 tablet 0   Social History   Socioeconomic History  . Marital status: Divorced    Spouse name: Not on file  .  Number of children: Not on file  . Years of education: Not on file  . Highest education level: Not on file  Occupational History  . Not on file  Social Needs  . Financial resource strain: Not on file  . Food insecurity:    Worry: Not on file    Inability: Not on file  . Transportation needs:    Medical: Not on file    Non-medical: Not on file  Tobacco Use  . Smoking status: Current Some Day Smoker    Packs/day: 0.50    Years: 0.50    Pack years: 0.25    Types: Cigarettes  . Smokeless tobacco: Never Used  . Tobacco comment: 1/2 pack weekly  Substance and Sexual Activity  . Alcohol use: No  . Drug use: Yes    Types: Heroin, Marijuana    Comment:  smoked marijuana 4  days ago.  hasn't used heroin since 06/01/2014  . Sexual activity: Not Currently    Birth control/protection: Surgical  Lifestyle  . Physical activity:    Days per week: Not on file    Minutes per session: Not on file  . Stress: Not on file  Relationships  . Social connections:    Talks on phone: Not on file    Gets together: Not on file    Attends religious service: Not on file    Active member of club or organization: Not on file    Attends meetings of clubs or organizations: Not on file    Relationship status: Not on file  . Intimate partner violence:    Fear of current or ex partner: Not on file    Emotionally abused: Not on file    Physically abused: Not on file    Forced sexual activity: Not on file  Other Topics Concern  . Not on file  Social History Narrative  . Not on file   Family History  Problem Relation Age of Onset  . Parkinson's disease Mother   . Hypertension Mother   . Parkinson's disease Maternal Grandmother   . Hypertension Maternal Grandmother   . Cancer Maternal Grandmother        lung  . Glaucoma Maternal Grandmother     OBJECTIVE: Vitals:   10/20/17 1420  BP: 124/62  Pulse: (!) 53  Temp: 98.2 F (36.8 C)  TempSrc: Oral  SpO2: 99%    General appearance: alert; no distress Lungs: clear to auscultation bilaterally Heart: regular rate and rhythm.  Radial pulse 2+ bilaterally Extremities: no edema Skin: warm and dry; two abscesses with mild erythema and induration without obvious fluctuance approximately 1 - 1.5 cm in diameter.  Obvious purulent discharge seen (see picture below).  Patient refuses I&D procedure in the office Psychological: alert and cooperative; normal mood and affect      ASSESSMENT & PLAN:  1. Abscess   2. Nausea without vomiting     Meds ordered this encounter  Medications  . doxycycline (VIBRAMYCIN) 100 MG capsule    Sig: Take 1 capsule (100 mg total) by mouth 2 (two) times daily.    Dispense:  20 capsule     Refill:  0    Order Specific Question:   Supervising Provider    Answer:   Isa Rankin [phone]  . ondansetron (ZOFRAN) 4 MG tablet    Sig: Take 1 tablet (4 mg total) by mouth every 6 (six) hours.    Dispense:  12 tablet    Refill:  0  Order Specific Question:   Supervising Provider    Answer:   Isa Rankin [[id number]]    Prescribed doxycycline Prescribed zofran for nausea Continue to take OTC medications as needed for pain relief Hot shower and warm compresses as needed for symptomatic relief Take as prescribed and to completion Follow up with PCP if symptoms persists Return or go to the ER if you have any new or worsening symptoms  Refuses I&D in office.  Cautioned patient to return if she does not have improvement in her symptoms with antibiotic, or if she has new or worsening symptoms   Reviewed expectations re: course of current medical issues. Questions answered. Outlined signs and symptoms indicating need for more acute intervention. Patient verbalized understanding. After Visit Summary given.   Rennis Harding, PA-C 10/20/17 1538

## 2017-10-20 NOTE — ED Triage Notes (Signed)
Pt reports an abscess in her right axillary that first appeared 3 weeks ago.  She states she took a hot shower and it opened and drained.  She states it came back soon after that and reopened today.  She is afraid it is going to come back again and states she may be getting a few of them in her groin area as well.  Pt states she has been feeling warm and nauseous for the last three days.

## 2017-10-29 ENCOUNTER — Telehealth (HOSPITAL_COMMUNITY): Payer: Self-pay

## 2017-11-06 ENCOUNTER — Emergency Department (HOSPITAL_BASED_OUTPATIENT_CLINIC_OR_DEPARTMENT_OTHER)
Admission: EM | Admit: 2017-11-06 | Discharge: 2017-11-06 | Discharge status: Against Medical Advice | Payer: Medicaid Other

## 2017-11-06 ENCOUNTER — Emergency Department (HOSPITAL_BASED_OUTPATIENT_CLINIC_OR_DEPARTMENT_OTHER)
Admission: EM | Admit: 2017-11-06 | Discharge: 2017-11-06 | Disposition: A | Discharge status: Home / Self Care | Payer: Medicaid Other | Attending: Emergency Medicine | Admitting: Emergency Medicine

## 2017-11-06 ENCOUNTER — Emergency Department (HOSPITAL_COMMUNITY): Payer: Self-pay

## 2017-11-06 ENCOUNTER — Emergency Department (HOSPITAL_COMMUNITY)
Admission: EM | Admit: 2017-11-06 | Discharge: 2017-11-06 | Disposition: A | Discharge status: Home / Self Care | Payer: Self-pay | Attending: Emergency Medicine | Admitting: Emergency Medicine

## 2017-11-06 ENCOUNTER — Encounter (HOSPITAL_COMMUNITY): Payer: Self-pay | Admitting: Emergency Medicine

## 2017-11-06 ENCOUNTER — Other Ambulatory Visit: Payer: Self-pay

## 2017-11-06 ENCOUNTER — Encounter (HOSPITAL_BASED_OUTPATIENT_CLINIC_OR_DEPARTMENT_OTHER): Payer: Self-pay | Admitting: Emergency Medicine

## 2017-11-06 DIAGNOSIS — R1110 Vomiting, unspecified: Secondary | ICD-10-CM | POA: Insufficient documentation

## 2017-11-06 DIAGNOSIS — R51 Headache: Secondary | ICD-10-CM | POA: Insufficient documentation

## 2017-11-06 DIAGNOSIS — Z79899 Other long term (current) drug therapy: Secondary | ICD-10-CM | POA: Insufficient documentation

## 2017-11-06 DIAGNOSIS — M549 Dorsalgia, unspecified: Secondary | ICD-10-CM | POA: Insufficient documentation

## 2017-11-06 DIAGNOSIS — I10 Essential (primary) hypertension: Secondary | ICD-10-CM | POA: Insufficient documentation

## 2017-11-06 DIAGNOSIS — F17210 Nicotine dependence, cigarettes, uncomplicated: Secondary | ICD-10-CM | POA: Insufficient documentation

## 2017-11-06 DIAGNOSIS — Z5321 Procedure and treatment not carried out due to patient leaving prior to being seen by health care provider: Secondary | ICD-10-CM | POA: Insufficient documentation

## 2017-11-06 DIAGNOSIS — R197 Diarrhea, unspecified: Secondary | ICD-10-CM | POA: Insufficient documentation

## 2017-11-06 DIAGNOSIS — R112 Nausea with vomiting, unspecified: Secondary | ICD-10-CM | POA: Insufficient documentation

## 2017-11-06 DIAGNOSIS — R1084 Generalized abdominal pain: Secondary | ICD-10-CM | POA: Insufficient documentation

## 2017-11-06 DIAGNOSIS — R05 Cough: Secondary | ICD-10-CM | POA: Insufficient documentation

## 2017-11-06 HISTORY — DX: Essential (primary) hypertension: I10

## 2017-11-06 LAB — URINALYSIS, ROUTINE W REFLEX MICROSCOPIC
BILIRUBIN URINE: NEGATIVE
Glucose, UA: NEGATIVE mg/dL
KETONES UR: NEGATIVE mg/dL
LEUKOCYTES UA: NEGATIVE
NITRITE: NEGATIVE
PH: 9 — AB (ref 5.0–8.0)
PROTEIN: 30 mg/dL — AB
RBC / HPF: >50 RBC/hpf — ABNORMAL HIGH (ref 0–5)
Specific Gravity, Urine: 1.011 (ref 1.005–1.030)

## 2017-11-06 LAB — COMPREHENSIVE METABOLIC PANEL
ALT: 31 U/L (ref 14–54)
AST: 32 U/L (ref 15–41)
Albumin: 4 g/dL (ref 3.5–5.0)
Alkaline Phosphatase: 80 U/L (ref 38–126)
Anion gap: 10 (ref 5–15)
BILIRUBIN TOTAL: 0.3 mg/dL (ref 0.3–1.2)
BUN: 7 mg/dL (ref 6–20)
CHLORIDE: 105 mmol/L (ref 101–111)
CO2: 21 mmol/L — ABNORMAL LOW (ref 22–32)
Calcium: 9.1 mg/dL (ref 8.9–10.3)
Creatinine, Ser: 0.78 mg/dL (ref 0.44–1.00)
GFR calc Af Amer: >60 mL/min (ref >60)
Glucose, Bld: 139 mg/dL — ABNORMAL HIGH (ref 65–99)
POTASSIUM: 3.6 mmol/L (ref 3.5–5.1)
Sodium: 136 mmol/L (ref 135–145)
TOTAL PROTEIN: 7.8 g/dL (ref 6.5–8.1)

## 2017-11-06 LAB — CBC
HEMATOCRIT: 40.3 % (ref 36.0–46.0)
HEMOGLOBIN: 13.3 g/dL (ref 12.0–15.0)
MCH: 27.5 pg (ref 26.0–34.0)
MCHC: 33 g/dL (ref 30.0–36.0)
MCV: 83.4 fL (ref 78.0–100.0)
Platelets: 315 10*3/uL (ref 150–400)
RBC: 4.83 MIL/uL (ref 3.87–5.11)
RDW: 14.5 % (ref 11.5–15.5)
WBC: 10.9 10*3/uL — AB (ref 4.0–10.5)

## 2017-11-06 LAB — I-STAT BETA HCG BLOOD, ED (MC, WL, AP ONLY): I-stat hCG, quantitative: <5 m[IU]/mL (ref <5)

## 2017-11-06 LAB — LIPASE, BLOOD: LIPASE: 24 U/L (ref 11–51)

## 2017-11-06 MED ORDER — METOCLOPRAMIDE HCL 10 MG PO TABS: 10.0000 mg | ORAL_TABLET | Freq: Four times a day (QID) | ORAL | 0 refills | Status: DC

## 2017-11-06 MED ORDER — HYDROMORPHONE HCL 1 MG/ML IJ SOLN
1.0000 mg | Freq: Once | INTRAMUSCULAR | Status: AC
  Administered 2017-11-06: 1 mg via INTRAVENOUS
  Filled 2017-11-06: qty 1

## 2017-11-06 MED ORDER — ONDANSETRON HCL 4 MG/2ML IJ SOLN
4.0000 mg | Freq: Once | INTRAMUSCULAR | Status: AC
  Administered 2017-11-06: 4 mg via INTRAVENOUS
  Filled 2017-11-06: qty 2

## 2017-11-06 NOTE — ED Triage Notes (Signed)
Family member reports vomiting, cough, R back pain, HA, heavy menses. VSS.

## 2017-11-06 NOTE — Discharge Instructions (Addendum)
Use Reglan for abdominal pain and nausea. If having more than 5 bowel movements daily, use Imodium as directed over-the-counter. Follow up with your doctor if symptoms persist.

## 2017-11-06 NOTE — ED Provider Notes (Signed)
[facility]-EMERGENCY DEPT Provider Note   CSN: [id number] Arrival date & time: 11/06/17  0335     History   Chief Complaint Chief Complaint  Patient presents with  . Nausea  . Emesis  . Diarrhea    HPI Nancy Terry is a 42 y.o. female.  Patient with a history of Hep C, HTN, kidney stones, Migraines, narcotic abuse presents with complaint of abdominal pain and right flank pain. Symptoms started 3 days ago. No fever. She has had nausea and vomiting without hematemesis. She denies urinary symptoms. She states "it may feel like a kidney stone". She is also having diarrhea, 3-4 episodes daily.  The history is provided by the patient. No language interpreter was used.    Past Medical History:  Diagnosis Date  . Anxiety   . Depression   . Hepatitis C   . Herpes infection   . Hypertension   . Kidney stones   . Migraines   . Narcotic abuse [facility])     Patient Active Problem List   Diagnosis Date Noted  . Menorrhagia 05/13/2017  . Sciatica 05/13/2017  . Migraines 05/07/2016  . Hepatitis C 05/07/2016  . Depression 04/05/2016  . Anxiety and depression 04/05/2016  . Narcotic abuse (HCC) 04/05/2016  . Breast lump on right side at 10 o'clock position 03/12/2014  . Pap smear of cervix shows high risk HPV present 03/12/2014    Past Surgical History:  Procedure Laterality Date  . arm surgery    . left elbow surgery     . TUBAL LIGATION       OB History    Gravida  3   Para  3   Term  3   Preterm      AB      Living  3     SAB      TAB      Ectopic      Multiple      Live Births               Home Medications    Prior to Admission medications   Medication Sig Start Date End Date Taking? Authorizing Provider  ALPRAZolam (XANAX) 0.25 MG tablet Take 0.25 mg by mouth 2 (two) times daily as needed for anxiety.    [provider]  benazepril-hydrochlorthiazide (LOTENSIN HCT) 10-12.5 MG tablet Take 1 tablet by mouth daily.     [provider]  buPROPion (WELLBUTRIN XL) 150 MG 24 hr tablet Take 1 tablet (150 mg total) by mouth daily. Patient not taking: Reported on 05/13/2017 03/21/17   Hoy Register, MD  carbamide peroxide (DEBROX) 6.5 % OTIC solution Place 5 drops into the left ear 2 (two) times daily. Patient not taking: Reported on 05/13/2017 03/21/17   Hoy Register, MD  doxycycline (VIBRAMYCIN) 100 MG capsule Take 1 capsule (100 mg total) by mouth 2 (two) times daily. 10/20/17   Wurst, [place], PA-C  hydrOXYzine (ATARAX/VISTARIL) 25 MG tablet Take 1 tablet (25 mg total) by mouth 3 (three) times daily as needed. 03/21/17   Hoy Register, MD  hydrOXYzine (ATARAX/VISTARIL) 25 MG tablet TAKE ONE TABLET BY MOUTH THREE TIMES A DAY AS NEEDED 07/07/17   Hoy Register, MD  ibuprofen (ADVIL,MOTRIN) 200 MG tablet Take 400 mg by mouth every 6 (six) hours as needed.    [provider]  medroxyPROGESTERone (PROVERA) 10 MG tablet Take 1 tablet (10 mg total) by mouth daily. Patient not taking: Reported on 05/13/2017 03/25/17  Hoy Register, MD  methadone (DOLOPHINE) 10 MG/5ML solution Take 80 mg by mouth every other day.     [provider]  methocarbamol (ROBAXIN) 500 MG tablet Take 2 tablets (1,000 mg total) by mouth 2 (two) times daily as needed for muscle spasms. Patient not taking: Reported on 05/13/2017 03/25/17   Hoy Register, MD  metoCLOPramide (REGLAN) 10 MG tablet Take 1 tablet (10 mg total) by mouth every 6 (six) hours. Patient not taking: Reported on 05/13/2017 10/11/15   Lavera Guise, MD  omeprazole (PRILOSEC) 40 MG capsule Take 1 capsule (40 mg total) by mouth daily. Patient not taking: Reported on 03/21/2017 04/12/16   Hoy Register, MD  ondansetron (ZOFRAN) 4 MG tablet Take 1 tablet (4 mg total) by mouth every 6 (six) hours. 10/20/17   Wurst, [place], PA-C  QUEtiapine (SEROQUEL) 100 MG tablet Take 100 mg by mouth 3 (three) times daily.    [provider]  topiramate  (TOPAMAX) 100 MG tablet Take 1 tablet (100 mg total) by mouth 2 (two) times daily. Patient not taking: Reported on 05/13/2017 03/21/17   Hoy Register, MD  traZODone (DESYREL) 100 MG tablet Take 1 tablet (100 mg total) by mouth at bedtime as needed for sleep. Patient not taking: Reported on 03/21/2017 05/07/16   Hoy Register, MD    Family History Family History  Problem Relation Age of Onset  . Parkinson's disease Mother   . Hypertension Mother   . Parkinson's disease Maternal Grandmother   . Hypertension Maternal Grandmother   . Cancer Maternal Grandmother        lung  . Glaucoma Maternal Grandmother     Social History Social History   Tobacco Use  . Smoking status: Current Some Day Smoker    Packs/day: 0.50    Years: 0.50    Pack years: 0.25    Types: Cigarettes  . Smokeless tobacco: Never Used  . Tobacco comment: 1/2 pack weekly  Substance Use Topics  . Alcohol use: No  . Drug use: Yes    Types: Heroin, Marijuana    Comment:  smoked marijuana 4 days ago.  hasn't used heroin since 06/01/2014     Allergies   Haldol [haloperidol decanoate] and Nitrates, organic   Review of Systems Review of Systems  Constitutional: Negative for chills and fever.  HENT: Negative.   Respiratory: Negative.  Negative for cough and shortness of breath.   Cardiovascular: Negative.  Negative for chest pain.  Gastrointestinal: Positive for abdominal pain, diarrhea, nausea and vomiting.  Genitourinary: Positive for flank pain.  Skin: Negative.   Neurological: Negative.      Physical Exam Updated Vital Signs BP 129/73 (BP Location: Left Arm)   Pulse (!) 55   Temp 98.2 F (36.8 C) (Oral)   Resp 20   Ht 5\' 6"  (1.676 m)   Wt 104.3 kg (230 lb)   LMP 11/04/2017   SpO2 94%   BMI 37.12 kg/m   Physical Exam  Constitutional: She is oriented to person, place, and time. She appears well-developed and well-nourished.  HENT:  Head: Normocephalic.  Neck: Normal range of motion. Neck  supple.  Cardiovascular: Normal rate and regular rhythm.  No murmur heard. Pulmonary/Chest: Effort normal and breath sounds normal. She has no wheezes. She has no rales.  Abdominal: Soft. Bowel sounds are normal. There is tenderness (Diffuse abdominal tenderness. ). There is no rebound and no guarding.  Obese abdomen.  Genitourinary:  Genitourinary Comments: Bilateral flank tenderness.   Musculoskeletal: Normal  range of motion.  Neurological: She is alert and oriented to person, place, and time.  Skin: Skin is warm and dry. No rash noted.  Psychiatric: She has a normal mood and affect.     ED Treatments / Results  Labs (all labs ordered are listed, but only abnormal results are displayed) Labs Reviewed  COMPREHENSIVE METABOLIC PANEL - Abnormal; Notable for the following components:      Result Value   CO2 21 (*)    Glucose, Bld 139 (*)    All other components within normal limits  CBC - Abnormal; Notable for the following components:   WBC 10.9 (*)    All other components within normal limits  URINALYSIS, ROUTINE W REFLEX MICROSCOPIC - Abnormal; Notable for the following components:   pH 9.0 (*)    Hgb urine dipstick LARGE (*)    Protein, ur 30 (*)    RBC / HPF >50 (*)    Bacteria, UA RARE (*)    All other components within normal limits  LIPASE, BLOOD  I-STAT BETA HCG BLOOD, ED ([facility], [facility], [facility] ONLY)    EKG None  Radiology Ct Renal Stone Study  Result Date: 11/06/2017 CLINICAL DATA:  Nausea, vomiting, diarrhea and RIGHT flank pain. History of kidney stones, hepatitis-C. EXAM: CT ABDOMEN AND PELVIS WITHOUT CONTRAST TECHNIQUE: Multidetector CT imaging of the abdomen and pelvis was performed following the standard protocol without IV contrast. COMPARISON:  CT abdomen and pelvis June 03, 2014 FINDINGS: LOWER CHEST: Lung bases are clear. The visualized heart size is normal. No pericardial effusion. HEPATOBILIARY: Hypodense liver compatible with steatosis, mild focal fatty  sparing about the gallbladder fossa. PANCREAS: Normal. SPLEEN: Normal. ADRENALS/URINARY TRACT: Kidneys are orthotopic, demonstrating normal size and morphology. No nephrolithiasis, hydronephrosis; limited assessment for renal masses by nonenhanced CT. The unopacified ureters are normal in course and caliber. Urinary bladder is partially distended and unremarkable. Normal adrenal glands. STOMACH/BOWEL: The stomach, small and large bowel are normal in course and caliber without inflammatory changes, sensitivity decreased by lack of enteric contrast. Moderate amount of retained large bowel stool. Normal appendix. VASCULAR/LYMPHATIC: Aortoiliac vessels are normal in course and caliber. No lymphadenopathy by CT size criteria. REPRODUCTIVE: Normal. OTHER: No intraperitoneal free fluid or free air. Subcentimeter cysts within the pelvis unchanged, nonspecific. LEFT upper lobe granuloma. MUSCULOSKELETAL: Non-acute. IMPRESSION: 1. No nephrolithiasis, hydronephrosis or acute intra-abdominal/pelvic process. 2. Hepatic steatosis. Electronically Signed   By: Awilda Metroourtnay  Bloomer M.D.   On: 11/06/2017 06:41    Procedures Procedures (including critical care time)  Medications Ordered in ED Medications  ondansetron (ZOFRAN) injection 4 mg (4 mg Intravenous Given 11/06/17 0526)  HYDROmorphone (DILAUDID) injection 1 mg (1 mg Intravenous Given 11/06/17 0524)     Initial Impression / Assessment and Plan / ED Course  I have reviewed the triage vital signs and the nursing notes.  Pertinent labs & imaging results that were available during my care of the patient were reviewed by me and considered in my medical decision making (see chart for details).     Patient here for evaluation of abdominal pain and flank pain. No fever. N, V, D.  Labs are essentially unremarkable. CT renal study performed and there are no abnormalities in the abdomen are identified. She feels better with IM pain and nausea medications.  VS  normal.  She can be discharged home and is encouraged to follow up with PCP. Reglan Rx provided.   Final Clinical Impressions(s) / ED Diagnoses   Final diagnoses:  None  ED Discharge Orders    None       Elpidio Anis, Cordelia Poche 11/06/17 1610    Zadie Rhine, MD 11/07/17 [phone]

## 2017-11-06 NOTE — ED Triage Notes (Addendum)
Patient complaining of nausea, vomiting, and diarrhea. Patient states she has a hx of kidney stones. Patient states that [facility] med center [facility].

## 2017-11-11 MED FILL — METOCLOPRAMIDE 10 MG TABLET: 10 | 7 days supply | Qty: 30 | Fill #0

## 2017-11-14 MED FILL — DOXYCYCLINE HYCLATE 100 MG: 100 | 10 days supply | Qty: 20 | Fill #0

## 2017-11-21 ENCOUNTER — Encounter: Payer: Self-pay | Admitting: Family Medicine

## 2017-11-21 ENCOUNTER — Ambulatory Visit: Payer: Self-pay | Attending: Family Medicine | Admitting: Family Medicine

## 2017-11-21 VITALS — BP 129/76 | HR 62 | Temp 97.4°F | Ht 64.0 in | Wt 223.4 lb

## 2017-11-21 DIAGNOSIS — F419 Anxiety disorder, unspecified: Secondary | ICD-10-CM

## 2017-11-21 DIAGNOSIS — F411 Generalized anxiety disorder: Secondary | ICD-10-CM | POA: Insufficient documentation

## 2017-11-21 DIAGNOSIS — L0292 Furuncle, unspecified: Secondary | ICD-10-CM | POA: Insufficient documentation

## 2017-11-21 DIAGNOSIS — F329 Major depressive disorder, single episode, unspecified: Secondary | ICD-10-CM

## 2017-11-21 DIAGNOSIS — N83291 Other ovarian cyst, right side: Secondary | ICD-10-CM | POA: Insufficient documentation

## 2017-11-21 DIAGNOSIS — N83292 Other ovarian cyst, left side: Secondary | ICD-10-CM | POA: Insufficient documentation

## 2017-11-21 DIAGNOSIS — Z113 Encounter for screening for infections with a predominantly sexual mode of transmission: Secondary | ICD-10-CM | POA: Insufficient documentation

## 2017-11-21 DIAGNOSIS — M5431 Sciatica, right side: Secondary | ICD-10-CM | POA: Insufficient documentation

## 2017-11-21 DIAGNOSIS — Z888 Allergy status to other drugs, medicaments and biological substances status: Secondary | ICD-10-CM | POA: Insufficient documentation

## 2017-11-21 DIAGNOSIS — M5432 Sciatica, left side: Secondary | ICD-10-CM | POA: Insufficient documentation

## 2017-11-21 DIAGNOSIS — G4700 Insomnia, unspecified: Secondary | ICD-10-CM | POA: Insufficient documentation

## 2017-11-21 DIAGNOSIS — I10 Essential (primary) hypertension: Secondary | ICD-10-CM | POA: Insufficient documentation

## 2017-11-21 DIAGNOSIS — F32A Depression, unspecified: Secondary | ICD-10-CM

## 2017-11-21 DIAGNOSIS — N83299 Other ovarian cyst, unspecified side: Secondary | ICD-10-CM

## 2017-11-21 DIAGNOSIS — R110 Nausea: Secondary | ICD-10-CM

## 2017-11-21 DIAGNOSIS — G4709 Other insomnia: Secondary | ICD-10-CM

## 2017-11-21 DIAGNOSIS — N920 Excessive and frequent menstruation with regular cycle: Secondary | ICD-10-CM | POA: Insufficient documentation

## 2017-11-21 DIAGNOSIS — Z720 Tobacco use: Secondary | ICD-10-CM

## 2017-11-21 MED ORDER — MEDROXYPROGESTERONE ACETATE 10 MG PO TABS: 10.0000 mg | ORAL_TABLET | Freq: Every day | ORAL | 0 refills | Status: DC

## 2017-11-21 MED ORDER — ONDANSETRON HCL 4 MG PO TABS: 4.0000 mg | ORAL_TABLET | Freq: Four times a day (QID) | ORAL | 0 refills | Status: DC

## 2017-11-21 MED ORDER — BUPROPION HCL ER (SR) 150 MG PO TB12: 150.0000 mg | ORAL_TABLET | Freq: Two times a day (BID) | ORAL | 3 refills | Status: DC

## 2017-11-21 MED ORDER — KETOROLAC TROMETHAMINE 60 MG/2ML IM SOLN
60.0000 mg | Freq: Once | INTRAMUSCULAR | Status: AC
  Administered 2017-11-21: 60 mg via INTRAMUSCULAR

## 2017-11-21 MED ORDER — METHOCARBAMOL 500 MG PO TABS: 1000.0000 mg | ORAL_TABLET | Freq: Two times a day (BID) | ORAL | 3 refills | Status: DC | PRN

## 2017-11-21 MED ORDER — HYDROXYZINE HCL 25 MG PO TABS: 25.0000 mg | ORAL_TABLET | Freq: Three times a day (TID) | ORAL | 1 refills | Status: DC | PRN

## 2017-11-21 MED ORDER — TRAZODONE HCL 100 MG PO TABS: 100.0000 mg | ORAL_TABLET | Freq: Every evening | ORAL | 0 refills | Status: DC | PRN

## 2017-11-21 MED ORDER — GABAPENTIN 300 MG PO CAPS: 300.0000 mg | ORAL_CAPSULE | Freq: Two times a day (BID) | ORAL | 3 refills | Status: DC

## 2017-11-21 MED ORDER — BENAZEPRIL-HYDROCHLOROTHIAZIDE 10-12.5 MG PO TABS: 1.0000 | ORAL_TABLET | Freq: Every day | ORAL | 3 refills | Status: DC

## 2017-11-21 MED FILL — ONDANSETRON HCL 4 MG TABLET: 4 | 3 days supply | Qty: 12 | Fill #0

## 2017-11-21 MED FILL — GABAPENTIN 300 MG CAPSULE: 300 | 30 days supply | Qty: 60 | Fill #0 | Status: TO

## 2017-11-21 MED FILL — hydrOXYzine HCL 25 MG TABS: 25 | 20 days supply | Qty: 60 | Fill #0

## 2017-11-21 MED FILL — METHOCARBAMOL 500 MG TABS: 500 | 30 days supply | Qty: 120 | Fill #0

## 2017-11-21 MED FILL — traZODone HCL 100 MG TABS: 100 | 30 days supply | Qty: 30 | Fill #0

## 2017-11-21 MED FILL — MEDROXYPROGESTERONE 10 MG T: 10 | 10 days supply | Qty: 10 | Fill #0

## 2017-11-21 NOTE — Progress Notes (Signed)
Subjective:  Patient ID: Nancy Terry, female    DOB: Feb 19, 1976  Age: 43 y.o. MRN: [id number]  CC: Abdominal Pain and Nausea   HPI Nancy Terry is a 42 year old female with a history of bipolar disorder, anxiety and depression, previous history of opioid abuse (currently on methadone), migraines, insomnia, GERD who presents today for a follow-up visit. She complains of bilateral lumbar pain which radiates down both lower extremities and has been present for the last year.  She had declined a prescription of gabapentin and a muscle relaxant at her last visit due to concern for side effects but is willing to receive medications today. She was seen at the ED for nausea which she states she has had close to a year and also for recurrent boils in her armpits and groin for which she was placed on doxycycline.  Both have been present for the last 2 months. Nausea was treated with antiemetics and she was subsequently discharged.  She denies the presence of fevers. She would like to be referred to GYN due to her menorrhagia and the presence of complex bilateral ovarian cyst.  I had referred her to GYN in 05/2017 and at that time she had declined taking Provera but is willing to go back on it. She is requesting screening for herpes. Currently takes hydroxyzine for anxiety but has not been taking Wellbutrin and is requesting a prescription for Ativan.  Denies suicidal ideations or intents.  Past Medical History:  Diagnosis Date  . Anxiety   . Depression   . Hepatitis C   . Herpes infection   . Hypertension   . Kidney stones   . Migraines   . Narcotic abuse [facility])     Past Surgical History:  Procedure Laterality Date  . arm surgery    . left elbow surgery     . TUBAL LIGATION      Allergies  Allergen Reactions  . Haldol [Haloperidol Decanoate] Other (See Comments)    Hallucinations  . Nitrates, Organic Other (See Comments)    headaches     Outpatient Medications Prior to Visit    Medication Sig Dispense Refill  . ALPRAZolam (XANAX) 0.25 MG tablet Take 0.25 mg by mouth 2 (two) times daily as needed for anxiety.    Marland Kitchen ibuprofen (ADVIL,MOTRIN) 200 MG tablet Take 400 mg by mouth every 6 (six) hours as needed.    . methadone (DOLOPHINE) 10 MG/5ML solution Take 80 mg by mouth every other day.     . metoCLOPramide (REGLAN) 10 MG tablet Take 1 tablet (10 mg total) by mouth every 6 (six) hours. 30 tablet 0  . benazepril-hydrochlorthiazide (LOTENSIN HCT) 10-12.5 MG tablet Take 1 tablet by mouth daily.    . hydrOXYzine (ATARAX/VISTARIL) 25 MG tablet Take 1 tablet (25 mg total) by mouth 3 (three) times daily as needed. 90 tablet 2  . hydrOXYzine (ATARAX/VISTARIL) 25 MG tablet TAKE ONE TABLET BY MOUTH THREE TIMES A DAY AS NEEDED 60 tablet 1  . methocarbamol (ROBAXIN) 500 MG tablet Take 2 tablets (1,000 mg total) by mouth 2 (two) times daily as needed for muscle spasms. 120 tablet 3  . ondansetron (ZOFRAN) 4 MG tablet Take 1 tablet (4 mg total) by mouth every 6 (six) hours. 12 tablet 0  . carbamide peroxide (DEBROX) 6.5 % OTIC solution Place 5 drops into the left ear 2 (two) times daily. (Patient not taking: Reported on 05/13/2017) 15 mL 0  . doxycycline (VIBRAMYCIN) 100 MG capsule Take 1 capsule (100  mg total) by mouth 2 (two) times daily. (Patient not taking: Reported on 11/21/2017) 20 capsule 0  . omeprazole (PRILOSEC) 40 MG capsule Take 1 capsule (40 mg total) by mouth daily. (Patient not taking: Reported on 03/21/2017) 90 capsule 0  . QUEtiapine (SEROQUEL) 100 MG tablet Take 100 mg by mouth 3 (three) times daily.    Marland Kitchen. topiramate (TOPAMAX) 100 MG tablet Take 1 tablet (100 mg total) by mouth 2 (two) times daily. (Patient not taking: Reported on 05/13/2017) 60 tablet 2  . buPROPion (WELLBUTRIN XL) 150 MG 24 hr tablet Take 1 tablet (150 mg total) by mouth daily. (Patient not taking: Reported on 05/13/2017) 60 tablet 3  . medroxyPROGESTERone (PROVERA) 10 MG tablet Take 1 tablet (10 mg total)  by mouth daily. (Patient not taking: Reported on 05/13/2017) 10 tablet 0  . traZODone (DESYREL) 100 MG tablet Take 1 tablet (100 mg total) by mouth at bedtime as needed for sleep. (Patient not taking: Reported on 03/21/2017) 30 tablet 0   No facility-administered medications prior to visit.     ROS Review of Systems  Constitutional: Negative for activity change, appetite change and fatigue.  HENT: Negative for congestion, sinus pressure and sore throat.   Eyes: Negative for visual disturbance.  Respiratory: Negative for cough, chest tightness, shortness of breath and wheezing.   Cardiovascular: Negative for chest pain and palpitations.  Gastrointestinal: Positive for nausea. Negative for abdominal distention, abdominal pain and constipation.  Endocrine: Negative for polydipsia.  Genitourinary: Negative for dysuria and frequency.  Musculoskeletal:       See hpi  Skin: Positive for rash.  Neurological: Negative for tremors, light-headedness and numbness.  Hematological: Does not bruise/bleed easily.  Psychiatric/Behavioral: Negative for agitation and behavioral problems.    Objective:  BP 129/76   Pulse 62   Temp (!) 97.4 F (36.3 C) (Oral)   Ht 5\' 4"  (1.626 m)   Wt 223 lb 6.4 oz (101.3 kg)   LMP 11/04/2017   SpO2 95%   BMI 38.35 kg/m   BP/Weight 11/21/2017 11/06/2017 11/06/2017  Systolic BP 129 129 127  Diastolic BP 76 73 78  Wt. (Lbs) 223.4 230 225  BMI 38.35 37.12 37.44      Physical Exam  Constitutional: She is oriented to person, place, and time. She appears well-developed and well-nourished.  Cardiovascular: Normal rate, normal heart sounds and intact distal pulses.  No murmur heard. Pulmonary/Chest: Effort normal and breath sounds normal. She has no wheezes. She has no rales. She exhibits no tenderness.  Abdominal: Soft. Bowel sounds are normal. She exhibits no distension and no mass. There is no tenderness.  Musculoskeletal: Normal range of motion.  Tenderness on  palpation  across lumbar spine.  Positive straight leg raise bilaterally.  Neurological: She is alert and oriented to person, place, and time.  Skin:  Moles in bilateral axilla and bilateral inguinal regions  Psychiatric: She has a normal mood and affect.    Dep[facility] screen [facility] 11/21/2017 05/13/2017 05/07/2016 04/05/2016  Decreased Interest 1 2 1 3   Down, Depressed, Hopeless 1 2 2 2   PHQ - 2 Score 2 4 3 5   Altered sleeping 2 2 3 3   Tired, decreased energy 3 2 3 3   Change in appetite 3 2 2 2   Feeling bad or failure about yourself  0 0 1 2  Trouble concentrating 1 1 2 3   Moving slowly or fidgety/restless 0 1 0 2  Suicidal thoughts 0 0 0 0  PHQ-9 Score 11  12 14 20     GAD 7 : Generalized Anxiety Score 11/21/2017 05/13/2017 05/07/2016 04/05/2016  Nervous, Anxious, on Edge 3 1 2 3   Control/stop worrying 3 0 2 2  Worry too much - different things 3 1 2 2   Trouble relaxing 3 2 3 3   Restless 3 2 2 2   Easily annoyed or irritable 3 2 3 3   Afraid - awful might happen 0 0 1 2  Total GAD 7 Score 18 8 15 17      Assessment & Plan:   1. Other insomnia - traZODone (DESYREL) 100 MG tablet; Take 1 tablet (100 mg total) by mouth at bedtime as needed for sleep.  Dispense: 30 tablet; Refill: 0  2. Tobacco abuse Spent 3 minutes counseling on hazardous effects of smoking but she is not ready to quit  3. Anxiety and depression PHQ 9 score is stable at 11 however GAD-7 score has increased significantly Uncontrolled-she has only been taking hydroxyzine and is requesting Ativan. Advised that short acting benzodiazepines are not preferred with regards to management of anxiety and depression Advised to resume Wellbutrin Consider addition of BuSpar if symptoms of anxiety persist - hydrOXYzine (ATARAX/VISTARIL) 25 MG tablet; Take 1 tablet (25 mg total) by mouth 3 (three) times daily as needed.  Dispense: 60 tablet; Refill: 1 - buPROPion (WELLBUTRIN SR) 150 MG 12 hr tablet; Take 1 tablet (150 mg total)  by mouth 2 (two) times daily.  Dispense: 60 tablet; Refill: 3  4. Bilateral sciatica Uncontrolled She previously refused medications in the past but is willing to commence them now - methocarbamol (ROBAXIN) 500 MG tablet; Take 2 tablets (1,000 mg total) by mouth 2 (two) times daily as needed for muscle spasms.  Dispense: 120 tablet; Refill: 3 - DG Lumbar Spine Complete; Future - gabapentin (NEURONTIN) 300 MG capsule; Take 1 capsule (300 mg total) by mouth 2 (two) times daily.  Dispense: 60 capsule; Refill: 3 - Ambulatory referral to Physical Medicine Rehab  5. Screening for STD (sexually transmitted disease) - HSV Type I/II IgG, IgMw/ reflex  6. Furunculosis Prescribed doxycycline from the ED and will be picking it up today.  7. Menorrhagia with regular cycle Refill for Provera - Ambulatory referral to Gynecology - US PELVIC COMPLETE WITH TRANSVAGINAL; Future  8. Complex ovarian cyst - Ambulatory referral to Gynecology - US PELVIC COMPLETE WITH TRANSVAGINAL; Future  9. Essential hypertension Controlled Counseled on blood pressure goal of less than 130/80, low-sodium, DASH diet, medication compliance, 150 minutes of moderate intensity exercise per week. Discussed medication compliance, adverse effects. - benazepril-hydrochlorthiazide (LOTENSIN HCT) 10-12.5 MG tablet; Take 1 tablet by mouth daily.  Dispense: 30 tablet; Refill: 3  10. Nausea Prescribed Reglan from the ED - H. pylori breath test   Meds ordered this encounter  Medications  . traZODone (DESYREL) 100 MG tablet    Sig: Take 1 tablet (100 mg total) by mouth at bedtime as needed for sleep.    Dispense:  30 tablet    Refill:  0  . ondansetron (ZOFRAN) 4 MG tablet    Sig: Take 1 tablet (4 mg total) by mouth every 6 (six) hours.    Dispense:  12 tablet    Refill:  0  . benazepril-hydrochlorthiazide (LOTENSIN HCT) 10-12.5 MG tablet    Sig: Take 1 tablet by mouth daily.    Dispense:  30 tablet    Refill:  3  .  hydrOXYzine (ATARAX/VISTARIL) 25 MG tablet    Sig: Take 1 tablet (25 mg  total) by mouth 3 (three) times daily as needed.    Dispense:  60 tablet    Refill:  1  . medroxyPROGESTERone (PROVERA) 10 MG tablet    Sig: Take 1 tablet (10 mg total) by mouth daily.    Dispense:  10 tablet    Refill:  0  . methocarbamol (ROBAXIN) 500 MG tablet    Sig: Take 2 tablets (1,000 mg total) by mouth 2 (two) times daily as needed for muscle spasms.    Dispense:  120 tablet    Refill:  3  . buPROPion (WELLBUTRIN SR) 150 MG 12 hr tablet    Sig: Take 1 tablet (150 mg total) by mouth 2 (two) times daily.    Dispense:  60 tablet    Refill:  3  . gabapentin (NEURONTIN) 300 MG capsule    Sig: Take 1 capsule (300 mg total) by mouth 2 (two) times daily.    Dispense:  60 capsule    Refill:  3    Follow-up: No follow-ups on file.   Hoy Register MD

## 2017-11-22 LAB — H. PYLORI BREATH TEST: H pylori Breath Test: NEGATIVE

## 2017-11-23 LAB — HSV TYPE I/II IGG, IGMW/ REFLEX
HSV 1 Glycoprotein G Ab, IgG: 30.3 index — ABNORMAL HIGH (ref 0.00–0.90)
HSV 2 IGG, TYPE SPEC: 7.08 {index} — AB (ref 0.00–0.90)

## 2017-11-25 ENCOUNTER — Telehealth: Payer: Self-pay

## 2017-11-25 NOTE — Telephone Encounter (Signed)
Patient was called and a message was left for patient to return phone call. 

## 2017-11-25 NOTE — Telephone Encounter (Signed)
Patient returned phone call and was informed  Of lab results.

## 2017-11-25 NOTE — Telephone Encounter (Signed)
-----   Message from Hoy RegisterEnobong Newlin, MD sent at 11/24/2017 12:48 PM EDT ----- Labs reveal chronic herpes (type I and type II) but no acute infection.  Type II herpes is genital herpes which is an STD and she will need to exercise sexual precautions to prevent transmission.  No treatment is required if she has no herpes flares.

## 2017-11-29 ENCOUNTER — Ambulatory Visit (HOSPITAL_COMMUNITY): Payer: Medicaid Other

## 2017-12-23 ENCOUNTER — Other Ambulatory Visit: Payer: Self-pay

## 2017-12-23 ENCOUNTER — Emergency Department (HOSPITAL_COMMUNITY)
Admission: EM | Admit: 2017-12-23 | Discharge: 2017-12-23 | Disposition: A | Discharge status: Home / Self Care | Payer: Medicaid Other | Attending: Emergency Medicine | Admitting: Emergency Medicine

## 2017-12-23 ENCOUNTER — Encounter (HOSPITAL_COMMUNITY): Payer: Self-pay

## 2017-12-23 DIAGNOSIS — F12188 Cannabis abuse with other cannabis-induced disorder: Secondary | ICD-10-CM | POA: Insufficient documentation

## 2017-12-23 DIAGNOSIS — Z79899 Other long term (current) drug therapy: Secondary | ICD-10-CM | POA: Insufficient documentation

## 2017-12-23 DIAGNOSIS — I10 Essential (primary) hypertension: Secondary | ICD-10-CM | POA: Insufficient documentation

## 2017-12-23 DIAGNOSIS — F17210 Nicotine dependence, cigarettes, uncomplicated: Secondary | ICD-10-CM | POA: Insufficient documentation

## 2017-12-23 DIAGNOSIS — R112 Nausea with vomiting, unspecified: Secondary | ICD-10-CM | POA: Insufficient documentation

## 2017-12-23 LAB — I-STAT CHEM 8, ED
BUN: 8 mg/dL (ref 6–20)
CHLORIDE: 105 mmol/L (ref 98–111)
CREATININE: 0.7 mg/dL (ref 0.44–1.00)
Calcium, Ion: 1.06 mmol/L — ABNORMAL LOW (ref 1.15–1.40)
Glucose, Bld: 128 mg/dL — ABNORMAL HIGH (ref 70–99)
HEMATOCRIT: 43 % (ref 36.0–46.0)
HEMOGLOBIN: 14.6 g/dL (ref 12.0–15.0)
POTASSIUM: 3.1 mmol/L — AB (ref 3.5–5.1)
Sodium: 141 mmol/L (ref 135–145)
TCO2: 21 mmol/L — ABNORMAL LOW (ref 22–32)

## 2017-12-23 LAB — RAPID URINE DRUG SCREEN, HOSP PERFORMED
Amphetamines: NOT DETECTED
Benzodiazepines: NOT DETECTED
Cocaine: NOT DETECTED
Opiates: POSITIVE — AB
Tetrahydrocannabinol: POSITIVE — AB

## 2017-12-23 LAB — URINALYSIS, ROUTINE W REFLEX MICROSCOPIC
Bilirubin Urine: NEGATIVE
GLUCOSE, UA: NEGATIVE mg/dL
Hgb urine dipstick: NEGATIVE
KETONES UR: NEGATIVE mg/dL
LEUKOCYTES UA: NEGATIVE
NITRITE: NEGATIVE
PH: 8 (ref 5.0–8.0)
Protein, ur: NEGATIVE mg/dL
Specific Gravity, Urine: 1.018 (ref 1.005–1.030)

## 2017-12-23 LAB — POC URINE PREG, ED: PREG TEST UR: NEGATIVE

## 2017-12-23 MED ORDER — ONDANSETRON 8 MG PO TBDP
8.0000 mg | ORAL_TABLET | Freq: Once | ORAL | Status: AC
  Administered 2017-12-23: 8 mg via ORAL
  Filled 2017-12-23: qty 1

## 2017-12-23 MED ORDER — KETOROLAC TROMETHAMINE 60 MG/2ML IM SOLN
30.0000 mg | Freq: Once | INTRAMUSCULAR | Status: AC
  Administered 2017-12-23: 30 mg via INTRAMUSCULAR
  Filled 2017-12-23: qty 2

## 2017-12-23 MED ORDER — DICYCLOMINE HCL 10 MG/ML IM SOLN
20.0000 mg | Freq: Once | INTRAMUSCULAR | Status: AC
  Administered 2017-12-23: 20 mg via INTRAMUSCULAR
  Filled 2017-12-23: qty 2

## 2017-12-23 MED ORDER — ONDANSETRON 8 MG PO TBDP: ORAL_TABLET | ORAL | 0 refills | Status: DC

## 2017-12-23 MED ORDER — CAPSAICIN 0.025 % EX CREA
TOPICAL_CREAM | Freq: Two times a day (BID) | CUTANEOUS | Status: DC
  Administered 2017-12-23: 06:00:00 via TOPICAL
  Filled 2017-12-23: qty 60

## 2017-12-23 NOTE — ED Provider Notes (Signed)
[facility]-EMERGENCY DEPT Provider Note   CSN: [id number] Arrival date & time: 12/23/17  6962     History   Chief Complaint Chief Complaint  Patient presents with  . Nausea  . Emesis  . Abdominal Pain    HPI Nancy Terry is a 42 y.o. female.  The history is provided by the patient.  Emesis   This is a recurrent problem. The current episode started 12 to 24 hours ago. The problem occurs 2 to 4 times per day. The problem has not changed since onset.The emesis has an appearance of stomach contents. There has been no fever. Pertinent negatives include no chills, no cough, no fever, no sweats and no URI.  Has vomiting every 3 weeks with abdominal pain and no one has been able to tell her why this keeps happening.    Past Medical History:  Diagnosis Date  . Anxiety   . Depression   . Hepatitis C   . Herpes infection   . Hypertension   . Kidney stones   . Migraines   . Narcotic abuse [facility])     Patient Active Problem List   Diagnosis Date Noted  . Complex ovarian cyst 11/21/2017  . Hypertension 11/21/2017  . Menorrhagia 05/13/2017  . Sciatica 05/13/2017  . Migraines 05/07/2016  . Hepatitis C 05/07/2016  . Depression 04/05/2016  . Anxiety and depression 04/05/2016  . Narcotic abuse (HCC) 04/05/2016  . Breast lump on right side at 10 o'clock position 03/12/2014  . Pap smear of cervix shows high risk HPV present 03/12/2014    Past Surgical History:  Procedure Laterality Date  . arm surgery    . left elbow surgery     . TUBAL LIGATION       OB History    Gravida  3   Para  3   Term  3   Preterm      AB      Living  3     SAB      TAB      Ectopic      Multiple      Live Births               Home Medications    Prior to Admission medications   Medication Sig Start Date End Date Taking? Authorizing Provider  benazepril-hydrochlorthiazide (LOTENSIN HCT) 10-12.5 MG tablet Take 1 tablet by mouth daily. 11/21/17  Yes  Hoy Register, MD  ibuprofen (ADVIL,MOTRIN) 200 MG tablet Take 400 mg by mouth every 6 (six) hours as needed for moderate pain.    Yes [provider]  methadone (DOLOPHINE) 10 MG/5ML solution Take 80 mg by mouth every other day.    Yes [provider]  buPROPion (WELLBUTRIN SR) 150 MG 12 hr tablet Take 1 tablet (150 mg total) by mouth 2 (two) times daily. Patient not taking: Reported on 12/23/2017 11/21/17   Hoy Register, MD  carbamide peroxide (DEBROX) 6.5 % OTIC solution Place 5 drops into the left ear 2 (two) times daily. Patient not taking: Reported on 05/13/2017 03/21/17   Hoy Register, MD  doxycycline (VIBRAMYCIN) 100 MG capsule Take 1 capsule (100 mg total) by mouth 2 (two) times daily. Patient not taking: Reported on 11/21/2017 10/20/17   [place], [place], PA-C  gabapentin (NEURONTIN) 300 MG capsule Take 1 capsule (300 mg total) by mouth 2 (two) times daily. Patient not taking: Reported on 12/23/2017 11/21/17   Hoy Register, MD  hydrOXYzine (ATARAX/VISTARIL) 25 MG tablet  Take 1 tablet (25 mg total) by mouth 3 (three) times daily as needed. Patient not taking: Reported on 12/23/2017 11/21/17   Hoy Register, MD  medroxyPROGESTERone (PROVERA) 10 MG tablet Take 1 tablet (10 mg total) by mouth daily. Patient not taking: Reported on 12/23/2017 11/21/17   Hoy Register, MD  methocarbamol (ROBAXIN) 500 MG tablet Take 2 tablets (1,000 mg total) by mouth 2 (two) times daily as needed for muscle spasms. Patient not taking: Reported on 12/23/2017 11/21/17   Hoy Register, MD  metoCLOPramide (REGLAN) 10 MG tablet Take 1 tablet (10 mg total) by mouth every 6 (six) hours. Patient not taking: Reported on 12/23/2017 11/06/17   Elpidio Anis, PA-C  omeprazole (PRILOSEC) 40 MG capsule Take 1 capsule (40 mg total) by mouth daily. Patient not taking: Reported on 03/21/2017 04/12/16   Hoy Register, MD  ondansetron (ZOFRAN) 4 MG tablet Take 1 tablet (4 mg total) by mouth every 6 (six)  hours. Patient not taking: Reported on 12/23/2017 11/21/17   Hoy Register, MD  topiramate (TOPAMAX) 100 MG tablet Take 1 tablet (100 mg total) by mouth 2 (two) times daily. Patient not taking: Reported on 05/13/2017 03/21/17   Hoy Register, MD  traZODone (DESYREL) 100 MG tablet Take 1 tablet (100 mg total) by mouth at bedtime as needed for sleep. Patient not taking: Reported on 12/23/2017 11/21/17   Hoy Register, MD    Family History Family History  Problem Relation Age of Onset  . Parkinson's disease Mother   . Hypertension Mother   . Parkinson's disease Maternal Grandmother   . Hypertension Maternal Grandmother   . Cancer Maternal Grandmother        lung  . Glaucoma Maternal Grandmother     Social History Social History   Tobacco Use  . Smoking status: Current Some Day Smoker    Packs/day: 0.50    Years: 0.50    Pack years: 0.25    Types: Cigarettes  . Smokeless tobacco: Never Used  . Tobacco comment: 1/2 pack weekly  Substance Use Topics  . Alcohol use: No  . Drug use: Yes    Types: Heroin, Marijuana    Comment:  smoked marijuana 4 days ago.  hasn't used heroin since 06/01/2014     Allergies   Haldol [haloperidol decanoate] and Nitrates, organic   Review of Systems Review of Systems  Constitutional: Negative for activity change, chills and fever.  Eyes: Negative for visual disturbance.  Respiratory: Negative for cough.   Cardiovascular: Negative for chest pain, palpitations and leg swelling.  Gastrointestinal: Positive for nausea and vomiting.  Genitourinary: Negative for flank pain and pelvic pain.  Musculoskeletal: Negative for back pain.  All other systems reviewed and are negative.    Physical Exam Updated Vital Signs BP (!) 145/89 (BP Location: Left Arm)   Pulse (!) 58   Temp 98.3 F (36.8 C)   Resp 18   Ht 5\' 4"  (1.626 m)   Wt 104.3 kg (230 lb)   SpO2 99%   BMI 39.48 kg/m   Physical Exam  Constitutional: She is oriented to person,  place, and time. She appears well-developed and well-nourished. No distress.  Smells of marijuana  HENT:  Head: Normocephalic and atraumatic.  Mouth/Throat: No oropharyngeal exudate.  Eyes: Pupils are equal, round, and reactive to light. Conjunctivae are normal.  Neck: Normal range of motion. Neck supple.  Cardiovascular: Normal rate, regular rhythm, normal heart sounds and intact distal pulses.  Pulmonary/Chest: Effort normal and breath sounds normal. No  stridor. She has no wheezes. She has no rales.  Abdominal: Soft. Bowel sounds are normal. She exhibits no mass. There is no tenderness. There is no rebound and no guarding.  Musculoskeletal: Normal range of motion.  Neurological: She is alert and oriented to person, place, and time. She displays normal reflexes.  Skin: Skin is warm and dry. Capillary refill takes less than 2 seconds.  Psychiatric: She has a normal mood and affect.     ED Treatments / Results  Labs (all labs ordered are listed, but only abnormal results are displayed) Results for orders placed or performed during the hospital encounter of 12/23/17  Urinalysis, Routine w reflex microscopic  Result Value Ref Range   Color, Urine YELLOW YELLOW   APPearance TURBID (A) CLEAR   Specific Gravity, Urine 1.018 1.005 - 1.030   pH 8.0 5.0 - 8.0   Glucose, UA NEGATIVE NEGATIVE mg/dL   Hgb urine dipstick NEGATIVE NEGATIVE   Bilirubin Urine NEGATIVE NEGATIVE   Ketones, ur NEGATIVE NEGATIVE mg/dL   Protein, ur NEGATIVE NEGATIVE mg/dL   Nitrite NEGATIVE NEGATIVE   Leukocytes, UA NEGATIVE NEGATIVE   RBC / HPF 0-5 0 - 5 RBC/hpf   Bacteria, UA RARE (A) NONE SEEN   Squamous Epithelial / LPF 6-10 0 - 5   Amorphous Crystal PRESENT   I-Stat Chem 8, ED  Result Value Ref Range   Sodium 141 135 - 145 mmol/L   Potassium 3.1 (L) 3.5 - 5.1 mmol/L   Chloride 105 98 - 111 mmol/L   BUN 8 6 - 20 mg/dL   Creatinine, Ser 9.140.70 0.44 - 1.00 mg/dL   Glucose, Bld 782128 (H) 70 - 99 mg/dL    Calcium, Ion 9.561.06 (L) 1.15 - 1.40 mmol/L   TCO2 21 (L) 22 - 32 mmol/L   Hemoglobin 14.6 12.0 - 15.0 g/dL   HCT 21.343.0 08.636.0 - 57.846.0 %  POC Urine Pregnancy, ED  (if pt is a pre-menopausal female)  [facility]  Result Value Ref Range   Preg Test, Ur NEGATIVE NEGATIVE   No results found.  None   Procedures Procedures (including critical care time)  Medications Ordered in ED Medications  capsaicin (ZOSTRIX) 0.025 % cream ( Topical Given 12/23/17 0613)  ondansetron (ZOFRAN-ODT) disintegrating tablet 8 mg (8 mg Oral Given 12/23/17 0539)  dicyclomine (BENTYL) injection 20 mg (20 mg Intramuscular Given 12/23/17 0540)  ketorolac (TORADOL) injection 30 mg (30 mg Intramuscular Given 12/23/17 0540)     We had a discussion about marijuana use and cyclic vomiting and chronic abdominal pain.  Patients states no one has ever told her that chronic ongoing marijuana use can cause this.    Exam is benign and reassuring I do not feel imaging is indicated at this time   Final Clinical Impressions(s) / ED Diagnoses   Return for pain, numbness, changes in vision or speech, fevers >100.4 unrelieved by medication, shortness of breath, intractable vomiting, or diarrhea, abdominal pain, Inability to tolerate liquids or food, cough, altered mental status or any concerns. No signs of systemic illness or infection. The patient is nontoxic-appearing on exam and vital signs are within normal limits. Will refer to urology for microscopy hematuria as patient is asymptomatic.  I have reviewed the triage vital signs and the nursing notes. Pertinent labs &imaging results that were available during my care of the patient were reviewed by me and considered in my medical decision making (see chart for details).  After history, exam, and medical workup  I feel the patient has been appropriately medically screened and is safe for discharge home. Pertinent diagnoses were discussed with the patient. Patient was given  return precautions.   Lakely Elmendorf, MD 12/23/17 [phone]

## 2017-12-23 NOTE — ED Triage Notes (Signed)
Pt presents to ED from home for N/V and ABD pain. Pt reports that this has been a reoccurring problem for her, but started again tonight around 0100.

## 2017-12-23 NOTE — ED Notes (Addendum)
Notified EDP,Palumbo,MD. Pt. I-stat Chem 8 results potassium 3.1 and RN, Isaias Cowmanllan made aware.

## 2017-12-30 ENCOUNTER — Encounter: Payer: Self-pay | Admitting: Physical Medicine & Rehabilitation

## 2018-01-06 ENCOUNTER — Ambulatory Visit: Payer: Medicaid Other | Admitting: Physical Medicine & Rehabilitation

## 2018-01-06 ENCOUNTER — Ambulatory Visit: Payer: Medicaid Other

## 2018-01-23 ENCOUNTER — Encounter: Payer: Self-pay | Admitting: Physical Medicine & Rehabilitation

## 2018-01-23 ENCOUNTER — Other Ambulatory Visit: Payer: Self-pay | Admitting: Family Medicine

## 2018-01-23 ENCOUNTER — Ambulatory Visit: Payer: No Typology Code available for payment source | Admitting: Physical Medicine & Rehabilitation

## 2018-01-23 ENCOUNTER — Encounter: Payer: No Typology Code available for payment source | Attending: Physical Medicine & Rehabilitation

## 2018-01-23 VITALS — BP 118/75 | HR 55 | Resp 14 | Ht 63.0 in | Wt 220.0 lb

## 2018-01-23 DIAGNOSIS — G8929 Other chronic pain: Secondary | ICD-10-CM

## 2018-01-23 DIAGNOSIS — F419 Anxiety disorder, unspecified: Principal | ICD-10-CM

## 2018-01-23 DIAGNOSIS — Z9889 Other specified postprocedural states: Secondary | ICD-10-CM | POA: Insufficient documentation

## 2018-01-23 DIAGNOSIS — M5441 Lumbago with sciatica, right side: Secondary | ICD-10-CM | POA: Insufficient documentation

## 2018-01-23 DIAGNOSIS — Z9851 Tubal ligation status: Secondary | ICD-10-CM | POA: Insufficient documentation

## 2018-01-23 DIAGNOSIS — F1511 Other stimulant abuse, in remission: Secondary | ICD-10-CM | POA: Insufficient documentation

## 2018-01-23 DIAGNOSIS — F329 Major depressive disorder, single episode, unspecified: Secondary | ICD-10-CM

## 2018-01-23 DIAGNOSIS — Z8249 Family history of ischemic heart disease and other diseases of the circulatory system: Secondary | ICD-10-CM | POA: Insufficient documentation

## 2018-01-23 DIAGNOSIS — Z8619 Personal history of other infectious and parasitic diseases: Secondary | ICD-10-CM | POA: Insufficient documentation

## 2018-01-23 DIAGNOSIS — F17210 Nicotine dependence, cigarettes, uncomplicated: Secondary | ICD-10-CM | POA: Insufficient documentation

## 2018-01-23 DIAGNOSIS — K760 Fatty (change of) liver, not elsewhere classified: Secondary | ICD-10-CM | POA: Insufficient documentation

## 2018-01-23 DIAGNOSIS — Z87442 Personal history of urinary calculi: Secondary | ICD-10-CM | POA: Insufficient documentation

## 2018-01-23 DIAGNOSIS — Z801 Family history of malignant neoplasm of trachea, bronchus and lung: Secondary | ICD-10-CM | POA: Insufficient documentation

## 2018-01-23 DIAGNOSIS — Z820 Family history of epilepsy and other diseases of the nervous system: Secondary | ICD-10-CM | POA: Insufficient documentation

## 2018-01-23 NOTE — Patient Instructions (Addendum)
Sacroiliac Joint Dysfunction Sacroiliac joint dysfunction is a condition that causes inflammation on one or both sides of the sacroiliac (SI) joint. The SI joint connects the lower part of the spine (sacrum) with the two upper portions of the pelvis (ilium). This condition causes deep aching or burning pain in the low back. In some cases, the pain may also spread into one or both buttocks or hips or spread down the legs. What are the causes? This condition may be caused by:  Pregnancy. During pregnancy, extra stress is put on the SI joints because the pelvis widens.  Injury, such as: ? Car accidents. ? Sport-related injuries. ? Work-related injuries.  Having one leg that is shorter than the other.  Conditions that affect the joints, such as: ? Rheumatoid arthritis. ? Gout. ? Psoriatic arthritis. ? Joint infection (septic arthritis).  Sometimes, the cause of SI joint dysfunction is not known. What are the signs or symptoms? Symptoms of this condition include:  Aching or burning pain in the lower back. The pain may also spread to other areas, such as: ? Buttocks. ? Groin. ? Thighs and legs.  Muscle spasms in or around the painful areas.  Increased pain when standing, walking, running, stair climbing, bending, or lifting.  How is this diagnosed? Your health care provider will do a physical exam and take your medical history. During the exam, the health care provider may move one or both of your legs to different positions to check for pain. Various tests may be done to help verify the diagnosis, including:  Imaging tests to look for other causes of pain. These may include: ? MRI. ? CT scan. ? Bone scan.  Diagnostic injection. A numbing medicine is injected into the SI joint using a needle. If the pain is temporarily improved or stopped after the injection, this can indicate that SI joint dysfunction is the problem.  How is this treated? Treatment may vary depending on the  cause and severity of your condition. Treatment options may include:  Applying ice or heat to the lower back area. This can help to reduce pain and muscle spasms.  Medicines to relieve pain or inflammation or to relax the muscles.  Wearing a back brace (sacroiliac brace) to help support the joint while your back is healing.  Physical therapy to increase muscle strength around the joint and flexibility at the joint. This may also involve learning proper body positions and ways of moving to relieve stress on the joint.  Direct manipulation of the SI joint.  Injections of steroid medicine into the joint in order to reduce pain and swelling.  Radiofrequency ablation to burn away nerves that are carrying pain messages from the joint.  Use of a device that provides electrical stimulation in order to reduce pain at the joint.  Surgery to put in screws and plates that limit or prevent joint motion. This is rare.  Follow these instructions at home:  Rest as needed. Limit your activities as directed by your health care provider.  Take medicines only as directed by your health care provider.  If directed, apply ice to the affected area: ? Put ice in a plastic bag. ? Place a towel between your skin and the bag. ? Leave the ice on for 20 minutes, 2-3 times per day.  Use a heating pad or a moist heat pack as directed by your health care provider.  Exercise as directed by your health care provider or physical therapist.  Keep all follow-up visits   as directed by your health care provider. This is important. Contact a health care provider if:  Your pain is not controlled with medicine.  You have a fever.  You have increasingly severe pain. Get help right away if:  You have weakness, numbness, or tingling in your legs or feet.  You lose control of your bladder or bowel. This information is not intended to replace advice given to you by your health care provider. Make sure you discuss  any questions you have with your health care provider. Document Released: 08/20/2008 Document Revised: 10/30/2015 Document Reviewed: 01/29/2014 Elsevier Interactive Patient Education  2018 [employer]   Back Exercises If you have pain in your back, do these exercises 2-3 times each day or as told by your doctor. When the pain goes away, do the exercises once each day, but repeat the steps more times for each exercise (do more repetitions). If you do not have pain in your back, do these exercises once each day or as told by your doctor. Exercises Single Knee to Chest  Do these steps 3-5 times in a row for each leg: 1. Lie on your back on a firm bed or the floor with your legs stretched out. 2. Bring one knee to your chest. 3. Hold your knee to your chest by grabbing your knee or thigh. 4. Pull on your knee until you feel a gentle stretch in your lower back. 5. Keep doing the stretch for 10-30 seconds. 6. Slowly let go of your leg and straighten it.  Pelvic Tilt  Do these steps 5-10 times in a row: 1. Lie on your back on a firm bed or the floor with your legs stretched out. 2. Bend your knees so they point up to the ceiling. Your feet should be flat on the floor. 3. Tighten your lower belly (abdomen) muscles to press your lower back against the floor. This will make your tailbone point up to the ceiling instead of pointing down to your feet or the floor. 4. Stay in this position for 5-10 seconds while you gently tighten your muscles and breathe evenly.  Cat-Cow  Do these steps until your lower back bends more easily: 1. Get on your hands and knees on a firm surface. Keep your hands under your shoulders, and keep your knees under your hips. You may put padding under your knees. 2. Let your head hang down, and make your tailbone point down to the floor so your lower back is round like the back of a cat. 3. Stay in this position for 5 seconds. 4. Slowly lift your head and make your  tailbone point up to the ceiling so your back hangs low (sags) like the back of a cow. 5. Stay in this position for 5 seconds.  Press-Ups  Do these steps 5-10 times in a row: 1. Lie on your belly (face-down) on the floor. 2. Place your hands near your head, about shoulder-width apart. 3. While you keep your back relaxed and keep your hips on the floor, slowly straighten your arms to raise the top half of your body and lift your shoulders. Do not use your back muscles. To make yourself more comfortable, you may change where you place your hands. 4. Stay in this position for 5 seconds. 5. Slowly return to lying flat on the floor.  Bridges  Do these steps 10 times in a row: 1. Lie on your back on a firm surface. 2. Bend your knees so they point   up to the ceiling. Your feet should be flat on the floor. 3. Tighten your butt muscles and lift your butt off of the floor until your waist is almost as high as your knees. If you do not feel the muscles working in your butt and the back of your thighs, slide your feet 1-2 inches farther away from your butt. 4. Stay in this position for 3-5 seconds. 5. Slowly lower your butt to the floor, and let your butt muscles relax.  If this exercise is too easy, try doing it with your arms crossed over your chest. Belly Crunches  Do these steps 5-10 times in a row: 1. Lie on your back on a firm bed or the floor with your legs stretched out. 2. Bend your knees so they point up to the ceiling. Your feet should be flat on the floor. 3. Cross your arms over your chest. 4. Tip your chin a little bit toward your chest but do not bend your neck. 5. Tighten your belly muscles and slowly raise your chest just enough to lift your shoulder blades a tiny bit off of the floor. 6. Slowly lower your chest and your head to the floor.  Back Lifts Do these steps 5-10 times in a row: 1. Lie on your belly (face-down) with your arms at your sides, and rest your forehead on the  floor. 2. Tighten the muscles in your legs and your butt. 3. Slowly lift your chest off of the floor while you keep your hips on the floor. Keep the back of your head in line with the curve in your back. Look at the floor while you do this. 4. Stay in this position for 3-5 seconds. 5. Slowly lower your chest and your face to the floor.  Contact a doctor if:  Your back pain gets a lot worse when you do an exercise.  Your back pain does not lessen 2 hours after you exercise. If you have any of these problems, stop doing the exercises. Do not do them again unless your doctor says it is okay. Get help right away if:  You have sudden, very bad back pain. If this happens, stop doing the exercises. Do not do them again unless your doctor says it is okay. This information is not intended to replace advice given to you by your health care provider. Make sure you discuss any questions you have with your health care provider. Document Released: 06/26/2010 Document Revised: 10/30/2015 Document Reviewed: 07/18/2014 Elsevier Interactive Patient Education  2018 [employer]  

## 2018-01-23 NOTE — Progress Notes (Signed)
Subjective:    Patient ID: Nancy Terry, female    DOB: 12-05-1975, 42 y.o.   MRN: [id number]  HPI CC RIght hip numbness and pain in RIght sid eback thigh and leg  2 yr hx without hx of trauma or injury  Pt has back pain every day but hip and leg pain occurs with walking.   Pt had work up for kidney stones , negative for nephrolithiasis, positive for hepatic steatosis.  No bowel or bladder dysfunction.  She has no progressive weakness in her lower extremities.  No fevers or chills.  No recent IV drug abuse.  Has had weight gain, no weight loss.  History of heroin abuse, treated at [facility] clinic    Pain Inventory Average Pain 5 Pain Right Now 6 My pain is sharp, burning, dull and stabbing  In the last 24 hours, has pain interfered with the following? General activity 6 Relation with others 7 Enjoyment of life 4 What TIME of day is your pain at its worst? morning, evening Sleep (in general) Poor  Pain is worse with: walking, bending, sitting, inactivity, standing and some activites Pain improves with: therapy/exercise and medication Relief from Meds: 4  Mobility walk without assistance how many minutes can you walk? 10-15 ability to climb steps?  yes do you drive?  yes Do you have any goals in this area?  yes  Function not employed: date last employed . Do you have any goals in this area?  yes  Neuro/Psych bladder control problems bowel control problems weakness numbness tingling trouble walking depression anxiety  Prior Studies new  Physicians involved in your care new   Family History  Problem Relation Age of Onset  . Parkinson's disease Mother   . Hypertension Mother   . Parkinson's disease Maternal Grandmother   . Hypertension Maternal Grandmother   . Cancer Maternal Grandmother        lung  . Glaucoma Maternal Grandmother    Social History   Socioeconomic History  . Marital status: Divorced    Spouse name: Not on file  . Number of  children: Not on file  . Years of education: Not on file  . Highest education level: Not on file  Occupational History  . Not on file  Social Needs  . Financial resource strain: Not on file  . Food insecurity:    Worry: Not on file    Inability: Not on file  . Transportation needs:    Medical: Not on file    Non-medical: Not on file  Tobacco Use  . Smoking status: Current Some Day Smoker    Packs/day: 0.50    Years: 0.50    Pack years: 0.25    Types: Cigarettes  . Smokeless tobacco: Never Used  . Tobacco comment: 1/2 pack weekly  Substance and Sexual Activity  . Alcohol use: No  . Drug use: Yes    Types: Heroin, Marijuana    Comment:  smoked marijuana 4 days ago.  hasn't used heroin since 06/01/2014  . Sexual activity: Not Currently    Birth control/protection: Surgical  Lifestyle  . Physical activity:    Days per week: Not on file    Minutes per session: Not on file  . Stress: Not on file  Relationships  . Social connections:    Talks on phone: Not on file    Gets together: Not on file    Attends religious service: Not on file    Active member of club or organization:  Not on file    Attends meetings of clubs or organizations: Not on file    Relationship status: Not on file  Other Topics Concern  . Not on file  Social History Narrative  . Not on file   Past Surgical History:  Procedure Laterality Date  . arm surgery    . left elbow surgery     . TUBAL LIGATION     Past Medical History:  Diagnosis Date  . Anxiety   . Depression   . Hepatitis C   . Herpes infection   . Hypertension   . Kidney stones   . Migraines   . Narcotic abuse (HCC)    BP 118/75 (BP Location: Right Arm, Patient Position: Sitting, Cuff Size: Normal)   Pulse (!) 55   Resp 14   Ht 5\' 3"  (1.6 m)   Wt 220 lb (99.8 kg)   SpO2 92%   BMI 38.97 kg/m   Opioid Risk Score:   Fall Risk Score:  `1  Depression screen PHQ 2/9  [facility] 01/23/2018 11/21/2017 05/13/2017  05/07/2016 04/05/2016  Decreased Interest 3 1 2 1 3   Down, Depressed, Hopeless 0 1 2 2 2   PHQ - 2 Score 3 2 4 3 5   Altered sleeping 0 2 2 3 3   Tired, decreased energy 1 3 2 3 3   Change in appetite 0 3 2 2 2   Feeling bad or failure about yourself  0 0 0 1 2  Trouble concentrating 0 1 1 2 3   Moving slowly or fidgety/restless 0 0 1 0 2  Suicidal thoughts 0 0 0 0 0  PHQ-9 Score 4 11 12 14 20   Difficult doing work/chores Somewhat difficult - - - -    Review of Systems  Constitutional: Positive for unexpected weight change.  Respiratory: Positive for cough.   Cardiovascular: Positive for leg swelling.  Gastrointestinal: Positive for constipation, nausea and vomiting.  Genitourinary: Positive for difficulty urinating.  Musculoskeletal: Positive for arthralgias and gait problem.  Skin: Positive for rash.  Neurological: Positive for weakness and numbness.       Tingling   Hematological: Bruises/bleeds easily.  Psychiatric/Behavioral: Positive for dysphoric mood. The patient is nervous/anxious.   All other systems reviewed and are negative.      Objective:   Physical Exam  Constitutional: She is oriented to person, place, and time. She appears well-developed and well-nourished. No distress.  HENT:  Head: Normocephalic and atraumatic.  Eyes: Pupils are equal, round, and reactive to light. EOM are normal.  Neck: Normal range of motion.  Cardiovascular: Normal rate and regular rhythm. Exam reveals no friction rub.  No murmur heard. Pulmonary/Chest: Effort normal and breath sounds normal. No stridor. No respiratory distress.  Abdominal: Soft. Bowel sounds are normal. She exhibits no distension. There is no tenderness.  Musculoskeletal:  Lumbar flexion is 75% lumbar extension 50% lumbar lateral bending is 50% twisting is 50%. Negative straight leg raising Faber's maneuver refers pain to the right sacroiliac area.   Neurological: She is alert and oriented to person, place, and time.  She has normal strength. She displays no atrophy. No sensory deficit. She exhibits normal muscle tone. Coordination and gait normal.  Reflex Scores:      Tricep reflexes are 1+ on the right side and 1+ on the left side.      Bicep reflexes are 1+ on the right side and 1+ on the left side.      Brachioradialis reflexes are 1+  on the right side and 1+ on the left side.      Patellar reflexes are 2+ on the right side and 2+ on the left side.      Achilles reflexes are 2+ on the right side and 2+ on the left side. Motor strength is 5/5 bilateral deltoid bicep tricep grip hip flexor knee extensor dorsiflexion Sensation is intact to light touch and pinprick bilateral lower limbs  Skin: Skin is warm and dry. She is not diaphoretic.  No track marks on forearms.  Psychiatric: She has a normal mood and affect. Her behavior is normal. Judgment and thought content normal.  Nursing note and vitals reviewed.         Assessment & Plan:  1.  Chronic low back pain she mainly has axial pain going into the hip and buttock area.  Sacroiliac area appears to be most painful she has positive Faber's test refers pain to that area. We will check x-ray of the lumbar spine Back exercises given Right sacroiliac injection in 1 month if no better 2.  Right lower extremity intermittent numbness mainly in the morning but clears up during the day.  If patient does not respond to treatment would recommend MRI

## 2018-01-31 ENCOUNTER — Ambulatory Visit (HOSPITAL_COMMUNITY)
Admission: RE | Admit: 2018-01-31 | Discharge: 2018-01-31 | Disposition: A | Discharge status: Home / Self Care | Payer: No Typology Code available for payment source | Source: Ambulatory Visit | Attending: Physical Medicine & Rehabilitation | Admitting: Physical Medicine & Rehabilitation

## 2018-01-31 DIAGNOSIS — M47816 Spondylosis without myelopathy or radiculopathy, lumbar region: Secondary | ICD-10-CM | POA: Insufficient documentation

## 2018-01-31 DIAGNOSIS — G8929 Other chronic pain: Secondary | ICD-10-CM | POA: Insufficient documentation

## 2018-01-31 DIAGNOSIS — M5441 Lumbago with sciatica, right side: Secondary | ICD-10-CM | POA: Insufficient documentation

## 2018-02-03 ENCOUNTER — Encounter: Payer: Medicaid Other | Admitting: Obstetrics and Gynecology

## 2018-02-20 ENCOUNTER — Encounter: Payer: Self-pay | Admitting: Physical Medicine & Rehabilitation

## 2018-02-20 ENCOUNTER — Ambulatory Visit (HOSPITAL_BASED_OUTPATIENT_CLINIC_OR_DEPARTMENT_OTHER): Payer: No Typology Code available for payment source | Admitting: Physical Medicine & Rehabilitation

## 2018-02-20 ENCOUNTER — Encounter: Payer: No Typology Code available for payment source | Attending: Physical Medicine & Rehabilitation

## 2018-02-20 VITALS — BP 106/69 | HR 64 | Ht 63.0 in | Wt 217.0 lb

## 2018-02-20 DIAGNOSIS — Z801 Family history of malignant neoplasm of trachea, bronchus and lung: Secondary | ICD-10-CM | POA: Insufficient documentation

## 2018-02-20 DIAGNOSIS — Z8249 Family history of ischemic heart disease and other diseases of the circulatory system: Secondary | ICD-10-CM | POA: Insufficient documentation

## 2018-02-20 DIAGNOSIS — M533 Sacrococcygeal disorders, not elsewhere classified: Secondary | ICD-10-CM

## 2018-02-20 DIAGNOSIS — Z87442 Personal history of urinary calculi: Secondary | ICD-10-CM | POA: Insufficient documentation

## 2018-02-20 DIAGNOSIS — F17210 Nicotine dependence, cigarettes, uncomplicated: Secondary | ICD-10-CM | POA: Insufficient documentation

## 2018-02-20 DIAGNOSIS — G8929 Other chronic pain: Secondary | ICD-10-CM | POA: Insufficient documentation

## 2018-02-20 DIAGNOSIS — Z9851 Tubal ligation status: Secondary | ICD-10-CM | POA: Insufficient documentation

## 2018-02-20 DIAGNOSIS — Z9889 Other specified postprocedural states: Secondary | ICD-10-CM | POA: Insufficient documentation

## 2018-02-20 DIAGNOSIS — K760 Fatty (change of) liver, not elsewhere classified: Secondary | ICD-10-CM | POA: Insufficient documentation

## 2018-02-20 DIAGNOSIS — F1511 Other stimulant abuse, in remission: Secondary | ICD-10-CM | POA: Insufficient documentation

## 2018-02-20 DIAGNOSIS — F419 Anxiety disorder, unspecified: Secondary | ICD-10-CM | POA: Insufficient documentation

## 2018-02-20 DIAGNOSIS — Z8619 Personal history of other infectious and parasitic diseases: Secondary | ICD-10-CM | POA: Insufficient documentation

## 2018-02-20 DIAGNOSIS — Z820 Family history of epilepsy and other diseases of the nervous system: Secondary | ICD-10-CM | POA: Insufficient documentation

## 2018-02-20 DIAGNOSIS — F329 Major depressive disorder, single episode, unspecified: Secondary | ICD-10-CM | POA: Insufficient documentation

## 2018-02-20 DIAGNOSIS — M5441 Lumbago with sciatica, right side: Secondary | ICD-10-CM | POA: Insufficient documentation

## 2018-02-20 NOTE — Progress Notes (Signed)

## 2018-02-20 NOTE — Progress Notes (Signed)
  PROCEDURE RECORD [facility]   Name: Melbourne AbtsSusan Derosa DOB:1976/01/16 MRN: [id number]  Date:02/20/2018  Physician: Claudette LawsAndrew Kirsteins, MD    Nurse/CMA: Nedra HaiLee, CMA  Allergies:  Allergies  Allergen Reactions  . Haldol [Haloperidol Decanoate] Other (See Comments)    Hallucinations  . Nitrates, Organic Other (See Comments)    headaches    Consent Signed: Yes.    Is patient diabetic? No.  CBG today?   Pregnant: No. LMP: No LMP recorded. (age 44-55)  Anticoagulants: no Anti-inflammatory: no Antibiotics: no  Procedure: Right Sacroiliac injection Position: Prone Start Time:11:09am  End Time:11:15am Fluoro Time: 14  RN/CMA Nedra HaiLee, CMA Paola Flynt, CMA    Time 10:57am 11:18am    BP 106/69 121/82    Pulse 62 57    Respirations 14 14    O2 Sat 93 95    S/S 6 6    Pain Level 5/10 3/10     D/C home with friend, patient A & O X 3, D/C instructions reviewed, and sits independently.

## 2018-02-20 NOTE — Patient Instructions (Signed)
Sacroiliac injection was performed today. A combination of a naming medicine plus a cortisone medicine was injected. The injection was done under x-ray guidance. This procedure has been performed to help reduce low back and buttocks pain as well as potentially hip pain. The duration of this injection is variable lasting from hours to  Months. It may repeated if needed. 

## 2018-02-21 ENCOUNTER — Ambulatory Visit: Payer: No Typology Code available for payment source | Admitting: Family Medicine

## 2018-03-08 ENCOUNTER — Other Ambulatory Visit: Payer: Self-pay | Admitting: Family Medicine

## 2018-03-08 DIAGNOSIS — F329 Major depressive disorder, single episode, unspecified: Secondary | ICD-10-CM

## 2018-03-08 DIAGNOSIS — F419 Anxiety disorder, unspecified: Principal | ICD-10-CM

## 2018-03-09 ENCOUNTER — Encounter: Payer: No Typology Code available for payment source | Attending: Physical Medicine & Rehabilitation

## 2018-03-09 ENCOUNTER — Encounter: Payer: Self-pay | Admitting: Physical Medicine & Rehabilitation

## 2018-03-09 ENCOUNTER — Encounter (HOSPITAL_COMMUNITY): Payer: Self-pay | Admitting: Emergency Medicine

## 2018-03-09 ENCOUNTER — Ambulatory Visit (HOSPITAL_BASED_OUTPATIENT_CLINIC_OR_DEPARTMENT_OTHER): Payer: No Typology Code available for payment source | Admitting: Physical Medicine & Rehabilitation

## 2018-03-09 ENCOUNTER — Ambulatory Visit (HOSPITAL_COMMUNITY)
Admission: EM | Admit: 2018-03-09 | Discharge: 2018-03-09 | Disposition: A | Discharge status: Home / Self Care | Payer: No Typology Code available for payment source | Attending: Family Medicine | Admitting: Family Medicine

## 2018-03-09 VITALS — BP 121/80 | HR 72 | Resp 14 | Ht 64.0 in | Wt 221.0 lb

## 2018-03-09 DIAGNOSIS — Z801 Family history of malignant neoplasm of trachea, bronchus and lung: Secondary | ICD-10-CM | POA: Insufficient documentation

## 2018-03-09 DIAGNOSIS — K760 Fatty (change of) liver, not elsewhere classified: Secondary | ICD-10-CM | POA: Insufficient documentation

## 2018-03-09 DIAGNOSIS — Z9889 Other specified postprocedural states: Secondary | ICD-10-CM | POA: Insufficient documentation

## 2018-03-09 DIAGNOSIS — G8929 Other chronic pain: Secondary | ICD-10-CM | POA: Insufficient documentation

## 2018-03-09 DIAGNOSIS — Z820 Family history of epilepsy and other diseases of the nervous system: Secondary | ICD-10-CM | POA: Insufficient documentation

## 2018-03-09 DIAGNOSIS — L02412 Cutaneous abscess of left axilla: Secondary | ICD-10-CM

## 2018-03-09 DIAGNOSIS — Z9851 Tubal ligation status: Secondary | ICD-10-CM | POA: Insufficient documentation

## 2018-03-09 DIAGNOSIS — L02214 Cutaneous abscess of groin: Secondary | ICD-10-CM

## 2018-03-09 DIAGNOSIS — L02411 Cutaneous abscess of right axilla: Secondary | ICD-10-CM

## 2018-03-09 DIAGNOSIS — F17210 Nicotine dependence, cigarettes, uncomplicated: Secondary | ICD-10-CM | POA: Insufficient documentation

## 2018-03-09 DIAGNOSIS — L0231 Cutaneous abscess of buttock: Secondary | ICD-10-CM

## 2018-03-09 DIAGNOSIS — F419 Anxiety disorder, unspecified: Secondary | ICD-10-CM | POA: Insufficient documentation

## 2018-03-09 DIAGNOSIS — Z8619 Personal history of other infectious and parasitic diseases: Secondary | ICD-10-CM | POA: Insufficient documentation

## 2018-03-09 DIAGNOSIS — F1511 Other stimulant abuse, in remission: Secondary | ICD-10-CM | POA: Insufficient documentation

## 2018-03-09 DIAGNOSIS — Z8249 Family history of ischemic heart disease and other diseases of the circulatory system: Secondary | ICD-10-CM | POA: Insufficient documentation

## 2018-03-09 DIAGNOSIS — F329 Major depressive disorder, single episode, unspecified: Secondary | ICD-10-CM | POA: Insufficient documentation

## 2018-03-09 DIAGNOSIS — M5441 Lumbago with sciatica, right side: Secondary | ICD-10-CM | POA: Insufficient documentation

## 2018-03-09 DIAGNOSIS — Z87442 Personal history of urinary calculi: Secondary | ICD-10-CM | POA: Insufficient documentation

## 2018-03-09 DIAGNOSIS — M533 Sacrococcygeal disorders, not elsewhere classified: Secondary | ICD-10-CM

## 2018-03-09 DIAGNOSIS — L0291 Cutaneous abscess, unspecified: Secondary | ICD-10-CM

## 2018-03-09 MED ORDER — DOXYCYCLINE HYCLATE 100 MG PO TABS: 100.0000 mg | ORAL_TABLET | Freq: Two times a day (BID) | ORAL | 2 refills | Status: DC

## 2018-03-09 MED ORDER — MUPIROCIN 2 % EX OINT: 1.0000 "application " | TOPICAL_OINTMENT | Freq: Three times a day (TID) | CUTANEOUS | 2 refills | Status: DC

## 2018-03-09 MED FILL — MUPIROCIN 2% OINTMENT: 2 | 22 days supply | Qty: 22 | Fill #0

## 2018-03-09 MED FILL — DOXYCYCLINE HYCLATE 100 MG: 100 | 10 days supply | Qty: 20 | Fill #0

## 2018-03-09 NOTE — ED Triage Notes (Signed)
Pt here with abscess to axillary area and groin area

## 2018-03-09 NOTE — Progress Notes (Signed)
Subjective:    Patient ID: Nancy Terry, female    DOB: 1976-06-07, 42 y.o.   MRN: [id number]  HPI 42 year old female with history of right-sided low back pain.  She had a right sacroiliac injection performed on 02/20/2018 which resulted in 50% pain relief for a couple days.  She states that during that time her back pain and right thigh pain improved.  Pain Inventory Average Pain 7 Pain Right Now 7 My pain is sharp, burning, dull, stabbing, tingling and aching  In the last 24 hours, has pain interfered with the following? General activity 9 Relation with others 7 Enjoyment of life 9 What TIME of day is your pain at its worst? morning, evening Sleep (in general) Fair  Pain is worse with: walking, bending, sitting, inactivity and standing Pain improves with: heat/ice and pacing activities Relief from Meds: 0  Mobility walk without assistance how many minutes can you walk? 15 ability to climb steps?  yes do you drive?  yes Do you have any goals in this area?  yes  Function not employed: date last employed . Do you have any goals in this area?  yes  Neuro/Psych bladder control problems weakness numbness tingling  Prior Studies Any changes since last visit?  no  Physicians involved in your care Any changes since last visit?  no   Family History  Problem Relation Age of Onset  . Parkinson's disease Mother   . Hypertension Mother   . Parkinson's disease Maternal Grandmother   . Hypertension Maternal Grandmother   . Cancer Maternal Grandmother        lung  . Glaucoma Maternal Grandmother    Social History   Socioeconomic History  . Marital status: Divorced    Spouse name: Not on file  . Number of children: Not on file  . Years of education: Not on file  . Highest education level: Not on file  Occupational History  . Not on file  Social Needs  . Financial resource strain: Not on file  . Food insecurity:    Worry: Not on file    Inability: Not on file    . Transportation needs:    Medical: Not on file    Non-medical: Not on file  Tobacco Use  . Smoking status: Current Some Day Smoker    Packs/day: 0.50    Years: 0.50    Pack years: 0.25    Types: Cigarettes  . Smokeless tobacco: Never Used  . Tobacco comment: 1/2 pack weekly  Substance and Sexual Activity  . Alcohol use: No  . Drug use: Yes    Types: Heroin, Marijuana    Comment:  smoked marijuana 4 days ago.  hasn't used heroin since 06/01/2014  . Sexual activity: Not Currently    Birth control/protection: Surgical  Lifestyle  . Physical activity:    Days per week: Not on file    Minutes per session: Not on file  . Stress: Not on file  Relationships  . Social connections:    Talks on phone: Not on file    Gets together: Not on file    Attends religious service: Not on file    Active member of club or organization: Not on file    Attends meetings of clubs or organizations: Not on file    Relationship status: Not on file  Other Topics Concern  . Not on file  Social History Narrative  . Not on file   Past Surgical History:  Procedure Laterality Date  .  arm surgery    . left elbow surgery     . TUBAL LIGATION     Past Medical History:  Diagnosis Date  . Anxiety   . Depression   . Hepatitis C   . Herpes infection   . Hypertension   . Kidney stones   . Migraines   . Narcotic abuse (HCC)    BP 121/80 (BP Location: Left Arm, Patient Position: Sitting, Cuff Size: Normal)   Pulse 72   Resp 14   Ht 5\' 4"  (1.626 m)   Wt 221 lb (100.2 kg)   SpO2 96%   BMI 37.93 kg/m   Opioid Risk Score:   Fall Risk Score:  `1  Depression screen PHQ 2/9  Depression screen [facility] 2/9 01/23/2018 11/21/2017 05/13/2017 05/07/2016 04/05/2016  Decreased Interest 3 1 2 1 3   Down, Depressed, Hopeless 0 1 2 2 2   PHQ - 2 Score 3 2 4 3 5   Altered sleeping 0 2 2 3 3   Tired, decreased energy 1 3 2 3 3   Change in appetite 0 3 2 2 2   Feeling bad or failure about yourself  0 0 0 1 2  Trouble  concentrating 0 1 1 2 3   Moving slowly or fidgety/restless 0 0 1 0 2  Suicidal thoughts 0 0 0 0 0  PHQ-9 Score 4 11 12 14 20   Difficult doing work/chores Somewhat difficult - - - -    Review of Systems  Constitutional: Positive for chills, diaphoresis and unexpected weight change.  HENT: Negative.   Eyes: Negative.   Respiratory: Negative.   Cardiovascular: Negative.   Gastrointestinal: Positive for nausea.  Endocrine: Negative.   Genitourinary: Negative.   Musculoskeletal: Positive for back pain and gait problem.  Skin: Negative.   Allergic/Immunologic: Negative.   Neurological: Positive for weakness and numbness.       Tingling  Psychiatric/Behavioral: Negative.   All other systems reviewed and are negative.      Objective:   Physical Exam  Constitutional: She appears well-developed and well-nourished. No distress.  HENT:  Head: Normocephalic and atraumatic.  Eyes: Pupils are equal, round, and reactive to light. EOM are normal.  Abdominal: Soft.  Skin: Skin is warm and dry. She is not diaphoretic.  Nursing note and vitals reviewed. Motor strength is 5/5 bilateral hip flexor knee extensor ankle dorsiflexor Negative straight leg raise Back is 50% range forward flexion extension lateral bending and rotation all of which cause some pain. Mild tenderness over the PSIS no pain with hip internal and external rotation        Assessment & Plan:  1.  RIght sacroiliac disorder with >50% relief of buttock back and Right lateral thigh pain after Right Fluoro guide sacroiliac injection.  We discussed longer lasting interventional pain options for this pt.  Specifically RIght L5 dorsal ramus, S1-S2-S3 lateral branch blocks  Flector patch  Lot [id number] discussed proper use 1/2 patch Q 12h to Right PSIS area

## 2018-03-09 NOTE — ED Provider Notes (Signed)
[facility]    CSN: [id number] Arrival date & time: 03/09/18  1023     History   Chief Complaint Chief Complaint  Patient presents with  . Abscess    HPI Nancy Terry is a 42 y.o. female.   Pt here with abscess to axillary area and groin area.  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  She was treated for less than 7 days and then the areas of infection have returned for the last week.  They are quite tender.  She has not had a fever.  The lesions are under both arms, both groins, and over the gluteal area bilaterally.  The infection under the right axilla is draining.     Past Medical History:  Diagnosis Date  . Anxiety   . Depression   . Hepatitis C   . Herpes infection   . Hypertension   . Kidney stones   . Migraines   . Narcotic abuse [facility])     Patient Active Problem List   Diagnosis Date Noted  . Complex ovarian cyst 11/21/2017  . Hypertension 11/21/2017  . Menorrhagia 05/13/2017  . Sciatica 05/13/2017  . Migraines 05/07/2016  . Hepatitis C 05/07/2016  . Depression 04/05/2016  . Anxiety and depression 04/05/2016  . Narcotic abuse (HCC) 04/05/2016  . Breast lump on right side at 10 o'clock position 03/12/2014  . Pap smear of cervix shows high risk HPV present 03/12/2014    Past Surgical History:  Procedure Laterality Date  . arm surgery    . left elbow surgery     . TUBAL LIGATION      OB History    Gravida  3   Para  3   Term  3   Preterm      AB      Living  3     SAB      TAB      Ectopic      Multiple      Live Births               Home Medications    Prior to Admission medications   Medication Sig Start Date End Date Taking? Authorizing Provider  doxycycline (VIBRA-TABS) 100 MG tablet Take 1 tablet (100 mg total) by mouth 2 (two) times daily. 03/09/18   Elvina Sidle, MD  hydrOXYzine (ATARAX/VISTARIL) 25 MG tablet TAKE ONE TABLET BY  MOUTH THREE TIMES A DAY AS NEEDED 03/09/18   Hoy Register, MD  methadone (DOLOPHINE) 10 MG/5ML solution Take 80 mg by mouth every other day.     [provider]  mupirocin ointment (BACTROBAN) 2 % Apply 1 application topically 3 (three) times daily. 03/09/18   Elvina Sidle, MD    Family History Family History  Problem Relation Age of Onset  . Parkinson's disease Mother   . Hypertension Mother   . Parkinson's disease Maternal Grandmother   . Hypertension Maternal Grandmother   . Cancer Maternal Grandmother        lung  . Glaucoma Maternal Grandmother     Social History Social History   Tobacco Use  . Smoking status: Current Some Day Smoker    Packs/day: 0.50    Years: 0.50    Pack years: 0.25    Types: Cigarettes  . Smokeless tobacco: Never Used  . Tobacco comment: 1/2 pack weekly  Substance Use Topics  . Alcohol use: No  .  Drug use: Yes    Types: Heroin, Marijuana    Comment:  smoked marijuana 4 days ago.  hasn't used heroin since 06/01/2014     Allergies   Haldol [haloperidol decanoate] and Nitrates, organic   Review of Systems Review of Systems  Skin: Positive for rash.  All other systems reviewed and are negative.    Physical Exam Triage Vital Signs ED Triage Vitals  Enc Vitals Group     BP 03/09/18 1046 130/71     Pulse Rate 03/09/18 1046 62     Resp 03/09/18 1046 18     Temp 03/09/18 1046 98.4 F (36.9 C)     Temp Source 03/09/18 1046 Oral     SpO2 03/09/18 1046 96 %     Weight --      Height --      Head Circumference --      Peak Flow --      Pain Score 03/09/18 1047 7     Pain Loc --      Pain Edu? --      Excl. in GC? --    No data found.  Updated Vital Signs BP 130/71 (BP Location: Left Arm)   Pulse 62   Temp 98.4 F (36.9 C) (Oral)   Resp 18   SpO2 96%   Visual Acuity Right Eye Distance:   Left Eye Distance:   Bilateral Distance:    Right Eye Near:   Left Eye Near:    Bilateral Near:     Physical Exam    Constitutional: She is oriented to person, place, and time. She appears well-developed and well-nourished.  HENT:  Edentulous  Eyes: Conjunctivae are normal.  Neck: Normal range of motion. Neck supple.  Pulmonary/Chest: Effort normal.  Musculoskeletal: Normal range of motion.  Neurological: She is alert and oriented to person, place, and time.  Skin: Rash noted.  Multiple 3 to 5 mm pustules are noted in the axillary areas bilaterally as well as groins and gluteal cheeks.  None of these are fluctuant.  Nursing note and vitals reviewed.    UC Treatments / Results  Labs (all labs ordered are listed, but only abnormal results are displayed) Labs Reviewed - No data to display  EKG None  Radiology No results found.  Procedures Procedures (including critical care time)  Medications Ordered in UC Medications - No data to display  Initial Impression / Assessment and Plan / UC Course  I have reviewed the triage vital signs and the nursing notes.  Pertinent labs & imaging results that were available during my care of the patient were reviewed by me and considered in my medical decision making (see chart for details).    Final Clinical Impressions(s) / UC Diagnoses   Final diagnoses:  Abscess   Discharge Instructions   None    ED Prescriptions    Medication Sig Dispense Auth. Provider   doxycycline (VIBRA-TABS) 100 MG tablet Take 1 tablet (100 mg total) by mouth 2 (two) times daily. 20 tablet Elvina Sidle, MD   mupirocin ointment (BACTROBAN) 2 % Apply 1 application topically 3 (three) times daily. 30 g Elvina Sidle, MD     Controlled Substance Prescriptions [place] Controlled Substance Registry consulted? Not Applicable   Elvina Sidle, MD 03/09/18 1129

## 2018-03-17 ENCOUNTER — Encounter: Payer: Self-pay | Admitting: Physical Medicine & Rehabilitation

## 2018-03-17 ENCOUNTER — Ambulatory Visit: Payer: No Typology Code available for payment source | Admitting: Physical Medicine & Rehabilitation

## 2018-03-17 NOTE — Progress Notes (Deleted)
  PROCEDURE RECORD  Physical Medicine and Rehabilitation   Name: Nancy Terry DOB:1975-12-21 MRN: [id number]  Date:03/17/2018  Physician: Claudette Laws, MD    Nurse/CMA: Elisia Stepp CMA  Allergies:  Allergies  Allergen Reactions  . Haldol [Haloperidol Decanoate] Other (See Comments)    Hallucinations  . Nitrates, Organic Other (See Comments)    headaches    Consent Signed: Yes.    Is patient diabetic? No.  CBG today? NA  Pregnant: No. LMP: No LMP recorded. (age 32-55)  Anticoagulants: no Anti-inflammatory: no Antibiotics: yes (doxycycline for Skin Abcess/infection under arms, not started yet)  Procedure:Right L5 Dorsal Ramus S1-3 Lateral Branch Blocks Position: Prone   Start Time: ***  End Time: ***  Fluoro Time: ***  RN/CMA *** ***    Time *** ***    BP *** ***    Pulse *** ***    Respirations *** ***    O2 Sat *** ***    S/S *** ***    Pain Level *** ***     Procedure not preformed today due to patient having skin infection that she is prescribed antibiotics for that she needs to complete.  Doctor verbally  ordered flector patch samples, 2 boxes, for the patient.  Medication and medication education sheets handed to patient.

## 2018-03-22 ENCOUNTER — Telehealth: Payer: Self-pay

## 2018-03-22 ENCOUNTER — Telehealth (HOSPITAL_COMMUNITY): Payer: Self-pay

## 2018-03-22 MED ORDER — ONDANSETRON HCL 4 MG PO TABS: 4.0000 mg | ORAL_TABLET | Freq: Three times a day (TID) | ORAL | 0 refills | Status: DC

## 2018-03-22 NOTE — Telephone Encounter (Signed)
Procedure not preformed today 17-Mar-2018 due to patient having skin infection that she is prescribed antibiotics for that she needs to complete.  Doctor verbally  ordered flector patch samples, 2 boxes, for the patient.  Medication and medication education sheets handed to patient.

## 2018-03-22 NOTE — Telephone Encounter (Signed)
Pt reports nausea with prescribed doxycyline. zofran called in to pharmacy per Dahlia Byes NP okay.

## 2018-03-27 ENCOUNTER — Ambulatory Visit: Payer: Self-pay | Attending: Family Medicine | Admitting: Family Medicine

## 2018-03-27 ENCOUNTER — Encounter: Payer: Self-pay | Admitting: Family Medicine

## 2018-03-27 VITALS — BP 125/79 | HR 68 | Temp 98.1°F | Ht 64.0 in | Wt 222.0 lb

## 2018-03-27 DIAGNOSIS — G4700 Insomnia, unspecified: Secondary | ICD-10-CM | POA: Insufficient documentation

## 2018-03-27 DIAGNOSIS — Z1159 Encounter for screening for other viral diseases: Secondary | ICD-10-CM

## 2018-03-27 DIAGNOSIS — I10 Essential (primary) hypertension: Secondary | ICD-10-CM | POA: Insufficient documentation

## 2018-03-27 DIAGNOSIS — R5383 Other fatigue: Secondary | ICD-10-CM | POA: Insufficient documentation

## 2018-03-27 DIAGNOSIS — Z888 Allergy status to other drugs, medicaments and biological substances status: Secondary | ICD-10-CM | POA: Insufficient documentation

## 2018-03-27 DIAGNOSIS — R112 Nausea with vomiting, unspecified: Secondary | ICD-10-CM | POA: Insufficient documentation

## 2018-03-27 DIAGNOSIS — L0292 Furuncle, unspecified: Secondary | ICD-10-CM | POA: Insufficient documentation

## 2018-03-27 DIAGNOSIS — M533 Sacrococcygeal disorders, not elsewhere classified: Secondary | ICD-10-CM

## 2018-03-27 DIAGNOSIS — F319 Bipolar disorder, unspecified: Secondary | ICD-10-CM | POA: Insufficient documentation

## 2018-03-27 DIAGNOSIS — F329 Major depressive disorder, single episode, unspecified: Secondary | ICD-10-CM

## 2018-03-27 DIAGNOSIS — Z9851 Tubal ligation status: Secondary | ICD-10-CM | POA: Insufficient documentation

## 2018-03-27 DIAGNOSIS — F419 Anxiety disorder, unspecified: Secondary | ICD-10-CM | POA: Insufficient documentation

## 2018-03-27 MED ORDER — TIZANIDINE HCL 4 MG PO TABS: 4.0000 mg | ORAL_TABLET | Freq: Three times a day (TID) | ORAL | 3 refills | Status: DC | PRN

## 2018-03-27 MED ORDER — GABAPENTIN 300 MG PO CAPS: 300.0000 mg | ORAL_CAPSULE | Freq: Two times a day (BID) | ORAL | 3 refills | Status: DC

## 2018-03-27 MED ORDER — PROMETHAZINE HCL 25 MG PO TABS: 25.0000 mg | ORAL_TABLET | Freq: Three times a day (TID) | ORAL | 0 refills | Status: DC | PRN

## 2018-03-27 MED ORDER — CHLORHEXIDINE GLUCONATE 4 % EX LIQD: Freq: Every day | CUTANEOUS | 0 refills | Status: DC | PRN

## 2018-03-27 MED ORDER — HYDROXYZINE HCL 25 MG PO TABS: 25.0000 mg | ORAL_TABLET | Freq: Three times a day (TID) | ORAL | 3 refills | Status: DC | PRN

## 2018-03-27 MED ORDER — MELOXICAM 7.5 MG PO TABS: 7.5000 mg | ORAL_TABLET | Freq: Every day | ORAL | 3 refills | Status: DC

## 2018-03-27 MED ORDER — CEPHALEXIN 500 MG PO CAPS: 500.0000 mg | ORAL_CAPSULE | Freq: Two times a day (BID) | ORAL | 0 refills | Status: DC

## 2018-03-27 MED FILL — GABAPENTIN 300 MG CAPSULE: 300 | 30 days supply | Qty: 60 | Fill #0

## 2018-03-27 MED FILL — PROMETHAZINE 25 MG TABLET: 25 | 6 days supply | Qty: 20 | Fill #0

## 2018-03-27 MED FILL — CEPHALEXIN 500 MG CAPSULE: 500 | 7 days supply | Qty: 14 | Fill #0

## 2018-03-27 MED FILL — MELOXICAM 7.5 MG TABLET: 7.5 | 30 days supply | Qty: 30 | Fill #0

## 2018-03-27 MED FILL — tiZANidine HCL 4 MG TABS: 4 | 30 days supply | Qty: 90 | Fill #0

## 2018-03-27 MED FILL — hydrOXYzine HCL 25 MG TABS: 25 | 30 days supply | Qty: 90 | Fill #0

## 2018-03-27 NOTE — Patient Instructions (Signed)

## 2018-03-27 NOTE — Progress Notes (Signed)
Subjective:  Patient ID: Nancy Terry, female    DOB: 1975-07-13  Age: 42 y.o. MRN: [id number]  CC: Hypertension   HPI Nancy Terry is a 42 year old female with a history of bipolar disorder, anxiety and depression, previous history of opioid abuse (currently on methadone), migraines, insomnia, GERD who presents today for a follow-up visit. She has chronic low back pain for which I had referred her to physical medicine and rehab and she was diagnosed with sacroiliac joint dysfunction by Dr. Wynn Banker.  She received corticosteroid injection last month which she said provided minimal relief and she has an upcoming appointment for nerve block next week.  She had previously discontinued taking gabapentin but has restarted again at the recommendation of Dr. Fritzi Mandes but pain still persist and sometimes radiates down her right leg. She was seen at urgent care for recurrent boils which occur in her axilla and her groin prescribed doxycycline which she was unable to tolerate and is requesting a different antibiotic.  She is requesting a vitamin B12 shots because she has had low energy and fatigue for the last 3 weeks.  Denies dizziness, vertigo, chest pains or shortness of breath. Her anxiety is uncontrolled on hydroxyzine as she is requesting refills.  Past Medical History:  Diagnosis Date  . Anxiety   . Depression   . Hepatitis C   . Herpes infection   . Hypertension   . Kidney stones   . Migraines   . Narcotic abuse [facility])     Past Surgical History:  Procedure Laterality Date  . arm surgery    . left elbow surgery     . TUBAL LIGATION      Allergies  Allergen Reactions  . Haldol [Haloperidol Decanoate] Other (See Comments)    Hallucinations  . Nitrates, Organic Other (See Comments)    headaches     Outpatient Medications Prior to Visit  Medication Sig Dispense Refill  . methadone (DOLOPHINE) 10 MG/5ML solution Take 80 mg by mouth every other day.     . mupirocin ointment  (BACTROBAN) 2 % Apply 1 application topically 3 (three) times daily. 30 g 2  . ondansetron (ZOFRAN) 4 MG tablet Take 1 tablet (4 mg total) by mouth every 8 (eight) hours. 12 tablet 0  . hydrOXYzine (ATARAX/VISTARIL) 25 MG tablet TAKE ONE TABLET BY MOUTH THREE TIMES A DAY AS NEEDED 90 tablet 0  . doxycycline (VIBRA-TABS) 100 MG tablet Take 1 tablet (100 mg total) by mouth 2 (two) times daily. (Patient not taking: Reported on 03/27/2018) 20 tablet 2   No facility-administered medications prior to visit.     ROS Review of Systems  Constitutional: Negative for activity change, appetite change and fatigue.  HENT: Negative for congestion, sinus pressure and sore throat.   Eyes: Negative for visual disturbance.  Respiratory: Negative for cough, chest tightness, shortness of breath and wheezing.   Cardiovascular: Negative for chest pain and palpitations.  Gastrointestinal: Negative for abdominal distention, abdominal pain and constipation.  Endocrine: Negative for polydipsia.  Genitourinary: Negative for dysuria and frequency.  Musculoskeletal:       See hpi  Skin: Negative for rash.  Neurological: Negative for tremors, light-headedness and numbness.  Hematological: Does not bruise/bleed easily.  Psychiatric/Behavioral: Negative for agitation and behavioral problems.    Objective:  BP 125/79   Pulse 68   Temp 98.1 F (36.7 C) (Oral)   Ht 5\' 4"  (1.626 m)   Wt 222 lb (100.7 kg)   SpO2 96%   BMI  38.11 kg/m   BP/Weight 03/27/2018 03/09/2018 03/09/2018  Systolic BP 125 130 121  Diastolic BP 79 71 80  Wt. (Lbs) 222 - 221  BMI 38.11 - 37.93      Physical Exam  Constitutional: She is oriented to person, place, and time. She appears well-developed and well-nourished.  Cardiovascular: Normal rate, normal heart sounds and intact distal pulses.  No murmur heard. Pulmonary/Chest: Effort normal and breath sounds normal. She has no wheezes. She has no rales. She exhibits no tenderness.    Abdominal: Soft. Bowel sounds are normal. She exhibits no distension and no mass. There is no tenderness.  Musculoskeletal:  Tenderness across lumbar spine, positive straight leg raise on the right  Neurological: She is alert and oriented to person, place, and time.  Skin: Skin is warm and dry.  Psychiatric: She has a normal mood and affect.     Assessment & Plan:   1. Anxiety and depression Controlled - hydrOXYzine (ATARAX/VISTARIL) 25 MG tablet; Take 1 tablet (25 mg total) by mouth 3 (three) times daily as needed.  Dispense: 90 tablet; Refill: 3  2. Non-intractable vomiting with nausea, unspecified vomiting type Uncontrolled We will need to exclude H. pylori - promethazine (PHENERGAN) 25 MG tablet; Take 1 tablet (25 mg total) by mouth every 8 (eight) hours as needed for nausea or vomiting.  Dispense: 20 tablet; Refill: 0 - H. pylori breath test; Future  3. Furunculosis Prescribed doxycycline which she has been unable to tolerate Placed on Keflex - chlorhexidine (HIBICLENS) 4 % external liquid; Apply topically daily as needed.  Dispense: 120 mL; Refill: 0  4. Sacroiliac dysfunction Status post corticosteroid injections and is scheduled for nerve block with Dr. Wynn Banker We will place on NSAID and muscle relaxant - gabapentin (NEURONTIN) 300 MG capsule; Take 1 capsule (300 mg total) by mouth 2 (two) times daily.  Dispense: 60 capsule; Refill: 3 - meloxicam (MOBIC) 7.5 MG tablet; Take 1 tablet (7.5 mg total) by mouth daily.  Dispense: 30 tablet; Refill: 3 - tiZANidine (ZANAFLEX) 4 MG tablet; Take 1 tablet (4 mg total) by mouth every 8 (eight) hours as needed for muscle spasms.  Dispense: 90 tablet; Refill: 3  5. Other fatigue Onset of about 3 weeks Will evaluate for metabolic causes and she has been advised to commence some form of physical activity - CBC with Differential/Platelet - T4, free - TSH - VITAMIN D 25 Hydroxy (Vit-D Deficiency, Fractures) - Vitamin B12  6.  Screening for viral disease - HIV Antibody (routine testing w rflx)   Meds ordered this encounter  Medications  . gabapentin (NEURONTIN) 300 MG capsule    Sig: Take 1 capsule (300 mg total) by mouth 2 (two) times daily.    Dispense:  60 capsule    Refill:  3  . meloxicam (MOBIC) 7.5 MG tablet    Sig: Take 1 tablet (7.5 mg total) by mouth daily.    Dispense:  30 tablet    Refill:  3  . tiZANidine (ZANAFLEX) 4 MG tablet    Sig: Take 1 tablet (4 mg total) by mouth every 8 (eight) hours as needed for muscle spasms.    Dispense:  90 tablet    Refill:  3  . hydrOXYzine (ATARAX/VISTARIL) 25 MG tablet    Sig: Take 1 tablet (25 mg total) by mouth 3 (three) times daily as needed.    Dispense:  90 tablet    Refill:  3  . promethazine (PHENERGAN) 25 MG tablet  Sig: Take 1 tablet (25 mg total) by mouth every 8 (eight) hours as needed for nausea or vomiting.    Dispense:  20 tablet    Refill:  0  . chlorhexidine (HIBICLENS) 4 % external liquid    Sig: Apply topically daily as needed.    Dispense:  120 mL    Refill:  0  . cephALEXin (KEFLEX) 500 MG capsule    Sig: Take 1 capsule (500 mg total) by mouth 2 (two) times daily.    Dispense:  14 capsule    Refill:  0    Follow-up: Return in about 1 week (around 04/03/2018) for nurse visit H.pylori; 1 month PAP smear with PCP.   Hoy Register MD

## 2018-03-27 NOTE — Progress Notes (Signed)
Patient would like to get a B-12 shot.

## 2018-03-28 LAB — CBC WITH DIFFERENTIAL/PLATELET
BASOS ABS: 0.1 10*3/uL (ref 0.0–0.2)
Basos: 1 %
EOS (ABSOLUTE): 0.3 10*3/uL (ref 0.0–0.4)
Eos: 3 %
HEMOGLOBIN: 12.5 g/dL (ref 11.1–15.9)
Hematocrit: 37.5 % (ref 34.0–46.6)
IMMATURE GRANS (ABS): 0.1 10*3/uL (ref 0.0–0.1)
Immature Granulocytes: 1 %
LYMPHS: 30 %
Lymphocytes Absolute: 2.8 10*3/uL (ref 0.7–3.1)
MCH: 26.8 pg (ref 26.6–33.0)
MCHC: 33.3 g/dL (ref 31.5–35.7)
MCV: 81 fL (ref 79–97)
MONOCYTES: 9 %
Monocytes Absolute: 0.9 10*3/uL (ref 0.1–0.9)
Neutrophils Absolute: 5.4 10*3/uL (ref 1.4–7.0)
Neutrophils: 56 %
Platelets: 362 10*3/uL (ref 150–450)
RBC: 4.66 x10E6/uL (ref 3.77–5.28)
RDW: 14.3 % (ref 12.3–15.4)
WBC: 9.5 10*3/uL (ref 3.4–10.8)

## 2018-03-28 LAB — VITAMIN D 25 HYDROXY (VIT D DEFICIENCY, FRACTURES): Vit D, 25-Hydroxy: 12 ng/mL — ABNORMAL LOW (ref 30.0–100.0)

## 2018-03-28 LAB — HIV ANTIBODY (ROUTINE TESTING W REFLEX): HIV SCREEN 4TH GENERATION: NONREACTIVE

## 2018-03-28 LAB — T4, FREE: FREE T4: 1.09 ng/dL (ref 0.82–1.77)

## 2018-03-28 LAB — TSH: TSH: 1.85 u[IU]/mL (ref 0.450–4.500)

## 2018-03-28 LAB — VITAMIN B12: VITAMIN B 12: 558 pg/mL (ref 232–1245)

## 2018-03-29 ENCOUNTER — Other Ambulatory Visit: Payer: Self-pay | Admitting: Family Medicine

## 2018-03-29 MED ORDER — ERGOCALCIFEROL 1.25 MG (50000 UT) PO CAPS: 50000.0000 [IU] | ORAL_CAPSULE | ORAL | 1 refills | Status: DC

## 2018-04-04 ENCOUNTER — Encounter: Payer: Self-pay | Admitting: Physical Medicine & Rehabilitation

## 2018-04-04 ENCOUNTER — Telehealth: Payer: Self-pay

## 2018-04-04 ENCOUNTER — Ambulatory Visit: Payer: No Typology Code available for payment source | Admitting: Physical Medicine & Rehabilitation

## 2018-04-04 VITALS — BP 110/66 | HR 68 | Ht 64.0 in | Wt 233.0 lb

## 2018-04-04 DIAGNOSIS — G8929 Other chronic pain: Secondary | ICD-10-CM

## 2018-04-04 DIAGNOSIS — M533 Sacrococcygeal disorders, not elsewhere classified: Secondary | ICD-10-CM

## 2018-04-04 NOTE — Patient Instructions (Addendum)
If this injection gives you a good temporary pain relief would do a radiofrequency procedure for a long term effect

## 2018-04-04 NOTE — Telephone Encounter (Signed)
Patient was called and a voicemail was left informing patient to return phone call for lab results.   If patient returns phone call please inform patient of lab results below. 

## 2018-04-04 NOTE — Progress Notes (Signed)
L5 dorsal ramus S1-S2-S3 lateral branch blocks under fluoroscopic guidance Left side  Informed consent was obtained after describing risks and benefits of the procedure with patient these include bleeding bruising and infection.  He elects to proceed and has given written consent.  Patient placed prone on fluoroscopy table Betadine prep sterile drape a 25-gauge 1.5 inch needle was used to anesthetize skin and subcu tissue with 1% lidocaine 1 cc into each of 4 sites.  Then a 22-gauge 3.5" needle was inserted under fluoroscopic guidance for starting the S1 SAP sacral ala junction.  Bone contact made.  Isovue 200 x 0.5 mL demonstrated no intravascular uptake then 0.5 mL of 2% lidocaine was injected.  Then the lateral aspect of the S1, S2, S3 foramen was targeted.  Bone contact made out, Isovue-200 times 0.5 mL demonstrated no nerve root or intravascular uptake within 0.5 mL of 2% lidocaine solution was injected after negative drawback for blood.  Patient tolerated procedure well.  Postinjection instructions given.  Pre injection pain 7/10 Post injection pain 0/10

## 2018-04-04 NOTE — Progress Notes (Signed)
  PROCEDURE RECORD [facility]   Name: Nancy Terry DOB:[birth year] MRN: [id number]  Date:04/04/2018  Physician: Claudette Laws, MD    Nurse/CMA: Thais Silberstein CMA   Allergies:  Allergies  Allergen Reactions  . Haldol [Haloperidol Decanoate] Other (See Comments)    Hallucinations  . Nitrates, Organic Other (See Comments)    headaches    Consent Signed: Yes.    Is patient diabetic? No.  CBG today? NA  Pregnant: No. LMP: Patient's last menstrual period was 01/17/2018. (age 84-55)  Anticoagulants: no Anti-inflammatory: no Antibiotics: no  Procedure: L5 Dorsal Ramus S1-3 Lateral Branch Block Position: Prone   Start Time: 2:31pm End Time: 2:40pm Fluoro Time: 37s   RN/CMA Abid Bolla CMA Misha Vanoverbeke CMA    Time 1:56pm 2:47pm    BP 110/66 143/89    Pulse 68 74    Respirations 16 16    O2 Sat 97 97    S/S 6 6    Pain Level 7/10 0/10     D/C home with friend, patient A & O X 3, D/C instructions reviewed, and sits independently.

## 2018-04-04 NOTE — Telephone Encounter (Signed)
-----   Message from Hoy Register, MD sent at 03/29/2018  1:18 PM EDT ----- Labs are normal except for decreased vitamin D could explain her fatigue.  I have sent a replacement for vitamin D to her pharmacy.

## 2018-04-05 ENCOUNTER — Ambulatory Visit: Payer: Self-pay | Attending: Family Medicine

## 2018-04-05 ENCOUNTER — Ambulatory Visit: Payer: Self-pay

## 2018-04-11 DIAGNOSIS — G894 Chronic pain syndrome: Secondary | ICD-10-CM | POA: Insufficient documentation

## 2018-04-25 MED FILL — GABAPENTIN 300 MG CAPSULE: 300 | 30 days supply | Qty: 60 | Fill #1

## 2018-05-02 ENCOUNTER — Other Ambulatory Visit: Payer: Medicaid Other

## 2018-05-02 ENCOUNTER — Ambulatory Visit: Payer: Self-pay | Admitting: Physical Medicine & Rehabilitation

## 2018-05-02 ENCOUNTER — Encounter: Payer: Self-pay | Attending: Physical Medicine & Rehabilitation

## 2018-05-02 DIAGNOSIS — G8929 Other chronic pain: Secondary | ICD-10-CM | POA: Insufficient documentation

## 2018-05-02 DIAGNOSIS — Z801 Family history of malignant neoplasm of trachea, bronchus and lung: Secondary | ICD-10-CM | POA: Insufficient documentation

## 2018-05-02 DIAGNOSIS — M5441 Lumbago with sciatica, right side: Secondary | ICD-10-CM | POA: Insufficient documentation

## 2018-05-02 DIAGNOSIS — F419 Anxiety disorder, unspecified: Secondary | ICD-10-CM | POA: Insufficient documentation

## 2018-05-02 DIAGNOSIS — F329 Major depressive disorder, single episode, unspecified: Secondary | ICD-10-CM | POA: Insufficient documentation

## 2018-05-02 DIAGNOSIS — Z9851 Tubal ligation status: Secondary | ICD-10-CM | POA: Insufficient documentation

## 2018-05-02 DIAGNOSIS — Z8249 Family history of ischemic heart disease and other diseases of the circulatory system: Secondary | ICD-10-CM | POA: Insufficient documentation

## 2018-05-02 DIAGNOSIS — F17210 Nicotine dependence, cigarettes, uncomplicated: Secondary | ICD-10-CM | POA: Insufficient documentation

## 2018-05-02 DIAGNOSIS — Z9889 Other specified postprocedural states: Secondary | ICD-10-CM | POA: Insufficient documentation

## 2018-05-02 DIAGNOSIS — Z8619 Personal history of other infectious and parasitic diseases: Secondary | ICD-10-CM | POA: Insufficient documentation

## 2018-05-02 DIAGNOSIS — K760 Fatty (change of) liver, not elsewhere classified: Secondary | ICD-10-CM | POA: Insufficient documentation

## 2018-05-02 DIAGNOSIS — Z820 Family history of epilepsy and other diseases of the nervous system: Secondary | ICD-10-CM | POA: Insufficient documentation

## 2018-05-02 DIAGNOSIS — F1511 Other stimulant abuse, in remission: Secondary | ICD-10-CM | POA: Insufficient documentation

## 2018-05-02 DIAGNOSIS — Z87442 Personal history of urinary calculi: Secondary | ICD-10-CM | POA: Insufficient documentation

## 2018-05-03 ENCOUNTER — Other Ambulatory Visit: Payer: Self-pay | Admitting: Family Medicine

## 2018-05-03 DIAGNOSIS — R112 Nausea with vomiting, unspecified: Secondary | ICD-10-CM

## 2018-05-03 MED FILL — PROMETHAZINE 25 MG TABLET: 25 | 6 days supply | Qty: 20 | Fill #0

## 2018-05-03 MED FILL — MELOXICAM 7.5 MG TABLET: 7.5 | 30 days supply | Qty: 30 | Fill #1

## 2018-05-03 MED FILL — VIT D2 1.25 MG (50,000 UNIT: 1.25 MG | 28 days supply | Qty: 4 | Fill #0

## 2018-05-03 MED FILL — GABAPENTIN 300 MG CAPSULE: 300 | 30 days supply | Qty: 60 | Fill #0

## 2018-05-03 MED FILL — hydrOXYzine HCL 25 MG TABS: 25 | 30 days supply | Qty: 90 | Fill #1

## 2018-05-09 ENCOUNTER — Other Ambulatory Visit: Payer: Medicaid Other | Admitting: Family Medicine

## 2018-05-09 ENCOUNTER — Telehealth: Payer: Self-pay | Admitting: *Deleted

## 2018-05-09 NOTE — Telephone Encounter (Signed)
Pt is non narcotic due to hx opioid abuse.  May call in flector patch 1 BID #60 with 1 Refill  and schedule for RFA for sacroiliac, Which side is hurting more right or left ?

## 2018-05-09 NOTE — Telephone Encounter (Signed)
Ms Nancy Terry called and is asking if Dr Wynn BankerKirsteins can give her something for pain or patches (she was given flector patches samples before).  She has tried the heat, hot showers, nsaids/tylenol and muscle relaxers.  Nothing is helping.  She missed her November appt due to a family emergency and next appt is 06/12/17.  Please advise.

## 2018-05-11 NOTE — Telephone Encounter (Addendum)
Unfortunately [facility] does not carry Flector patches and she has the orange card.  Even with [facility] discount card the cost is nearly $400 for 60 patches. She has an appt for 1/6 for RF.

## 2018-05-12 NOTE — Telephone Encounter (Signed)
Looks like she is already on gabapentin and muscle relaxer and an anti-inflammatory I really do not have anything else to offer.  She is on methadone for her chronic narcotic dependence

## 2018-05-12 NOTE — Telephone Encounter (Signed)
Does it cover voltaren gel?

## 2018-05-12 NOTE — Telephone Encounter (Signed)
Name brand is on back order but may have the generic.  Looks like she is on Meloxicam, though.

## 2018-05-15 NOTE — Telephone Encounter (Signed)
Notified. 

## 2018-05-22 ENCOUNTER — Encounter (HOSPITAL_BASED_OUTPATIENT_CLINIC_OR_DEPARTMENT_OTHER): Payer: Self-pay | Admitting: Emergency Medicine

## 2018-05-22 ENCOUNTER — Other Ambulatory Visit: Payer: Self-pay

## 2018-05-22 ENCOUNTER — Emergency Department (HOSPITAL_BASED_OUTPATIENT_CLINIC_OR_DEPARTMENT_OTHER)
Admission: EM | Admit: 2018-05-22 | Discharge: 2018-05-22 | Disposition: A | Discharge status: Home / Self Care | Payer: Medicaid Other | Attending: Emergency Medicine | Admitting: Emergency Medicine

## 2018-05-22 DIAGNOSIS — R109 Unspecified abdominal pain: Secondary | ICD-10-CM | POA: Insufficient documentation

## 2018-05-22 DIAGNOSIS — R112 Nausea with vomiting, unspecified: Secondary | ICD-10-CM | POA: Insufficient documentation

## 2018-05-22 DIAGNOSIS — Z5321 Procedure and treatment not carried out due to patient leaving prior to being seen by health care provider: Secondary | ICD-10-CM | POA: Insufficient documentation

## 2018-05-22 NOTE — ED Notes (Signed)
Pt out in hallway stating she needs to go and plans to come back later. Pt WAS NOT seen by ED provider.

## 2018-05-22 NOTE — ED Provider Notes (Signed)
Patient left department prior to formal evaluation.  Vital signs reviewed and reassuring.  Patient was ambulatory independently.   Shon BatonHorton, Elvan Ebron F, MD 05/22/18 [phone]

## 2018-05-22 NOTE — ED Triage Notes (Addendum)
Pt states she has been vomiting non stop since 1900 yesterday. Vomited approx 10 times. Chills, nausea, vomiting, abdominal pain from vomiting. Pt states she tried to take promethazine around 1830 but was unable to keep it down. She also took a hydroxyzine and it came back up too. Pt did not gag, dry heave, or vomit during triage.

## 2018-05-26 MED FILL — GABAPENTIN 300 MG CAPSULE: 300 | 30 days supply | Qty: 60 | Fill #1

## 2018-06-12 ENCOUNTER — Ambulatory Visit: Payer: Self-pay | Admitting: Physical Medicine & Rehabilitation

## 2018-06-13 MED FILL — hydrOXYzine HCL 25 MG TABS: 25 | 30 days supply | Qty: 90 | Fill #2

## 2018-06-27 MED FILL — GABAPENTIN 300 MG CAPSULE: 300 | 30 days supply | Qty: 60 | Fill #2

## 2018-07-23 ENCOUNTER — Other Ambulatory Visit: Payer: Self-pay

## 2018-07-23 ENCOUNTER — Encounter (HOSPITAL_COMMUNITY): Payer: Self-pay | Admitting: Emergency Medicine

## 2018-07-23 ENCOUNTER — Emergency Department (HOSPITAL_COMMUNITY)
Admission: EM | Admit: 2018-07-23 | Discharge: 2018-07-23 | Disposition: A | Discharge status: Home / Self Care | Payer: Medicaid Other | Attending: Emergency Medicine | Admitting: Emergency Medicine

## 2018-07-23 DIAGNOSIS — Z79899 Other long term (current) drug therapy: Secondary | ICD-10-CM | POA: Insufficient documentation

## 2018-07-23 DIAGNOSIS — R1110 Vomiting, unspecified: Secondary | ICD-10-CM | POA: Insufficient documentation

## 2018-07-23 DIAGNOSIS — I10 Essential (primary) hypertension: Secondary | ICD-10-CM | POA: Insufficient documentation

## 2018-07-23 DIAGNOSIS — F17210 Nicotine dependence, cigarettes, uncomplicated: Secondary | ICD-10-CM | POA: Insufficient documentation

## 2018-07-23 DIAGNOSIS — R112 Nausea with vomiting, unspecified: Secondary | ICD-10-CM

## 2018-07-23 MED ORDER — ONDANSETRON HCL 4 MG/2ML IJ SOLN: 4.0000 mg | Freq: Once | INTRAMUSCULAR | Status: DC | PRN

## 2018-07-23 MED ORDER — PROMETHAZINE HCL 25 MG PO TABS: 25.0000 mg | ORAL_TABLET | Freq: Three times a day (TID) | ORAL | 0 refills | Status: DC | PRN

## 2018-07-23 MED ORDER — SODIUM CHLORIDE 0.9% FLUSH: 3.0000 mL | Freq: Once | INTRAVENOUS | Status: DC

## 2018-07-23 NOTE — ED Triage Notes (Signed)
Patient complaining of wakening up around 3 in the morning with a headache and urge to vomit. Patient states that this been happening for a couple weeks.

## 2018-07-23 NOTE — ED Notes (Signed)
Patient aware of  Urine need

## 2018-07-23 NOTE — ED Provider Notes (Signed)
[facility]-EMERGENCY DEPT Provider Note   CSN: [id number] Arrival date & time: 07/23/18  0344     History   Chief Complaint Chief Complaint  Patient presents with  . Nausea  . Emesis  . Headache    HPI Nancy Terry is a 43 y.o. female.  Patient presents to the emergency department with a chief complaint of vomiting.  She reports that she has recurrent morning vomiting for the past 6 months.  She denies any abdominal pain.  Denies any fevers, chills, diarrhea, dysuria, vaginal discharge or bleeding.  She takes Phenergan with some relief.  She states that she is out of her Phenergan.  She is not able to see her primary care doctor for another month.  She has not been able to see gastroenterology.  She denies any other associated symptoms.  There are no modifying or aggravating factors.  The history is provided by the patient. No language interpreter was used.    Past Medical History:  Diagnosis Date  . Anxiety   . Depression   . Hepatitis C   . Herpes infection   . Hypertension   . Kidney stones   . Migraines   . Narcotic abuse [facility])     Patient Active Problem List   Diagnosis Date Noted  . Chronic pain syndrome 04/11/2018  . Complex ovarian cyst 11/21/2017  . Hypertension 11/21/2017  . Menorrhagia 05/13/2017  . Sciatica 05/13/2017  . Migraines 05/07/2016  . Hepatitis C 05/07/2016  . Depression 04/05/2016  . Anxiety and depression 04/05/2016  . Narcotic abuse (HCC) 04/05/2016  . Breast lump on right side at 10 o'clock position 03/12/2014  . Pap smear of cervix shows high risk HPV present 03/12/2014    Past Surgical History:  Procedure Laterality Date  . arm surgery    . left elbow surgery     . TUBAL LIGATION       OB History    Gravida  3   Para  3   Term  3   Preterm      AB      Living  3     SAB      TAB      Ectopic      Multiple      Live Births               Home Medications    Prior to Admission  medications   Medication Sig Start Date End Date Taking? Authorizing Provider  cephALEXin (KEFLEX) 500 MG capsule Take 1 capsule (500 mg total) by mouth 2 (two) times daily. 03/27/18   Hoy Register, MD  chlorhexidine (HIBICLENS) 4 % external liquid Apply topically daily as needed. 03/27/18   Hoy Register, MD  doxycycline (VIBRA-TABS) 100 MG tablet Take 1 tablet (100 mg total) by mouth 2 (two) times daily. 03/09/18   Elvina Sidle, MD  ergocalciferol (DRISDOL) 50000 units capsule Take 1 capsule (50,000 Units total) by mouth once a week. 03/29/18   Hoy Register, MD  gabapentin (NEURONTIN) 300 MG capsule Take 1 capsule (300 mg total) by mouth 2 (two) times daily. 03/27/18   Hoy Register, MD  hydrOXYzine (ATARAX/VISTARIL) 25 MG tablet Take 1 tablet (25 mg total) by mouth 3 (three) times daily as needed. 03/27/18   Hoy Register, MD  meloxicam (MOBIC) 7.5 MG tablet Take 1 tablet (7.5 mg total) by mouth daily. 03/27/18   Hoy Register, MD  methadone (DOLOPHINE) 10 MG/5ML solution Take 80 mg by  mouth every other day.     [provider]  mupirocin ointment (BACTROBAN) 2 % Apply 1 application topically 3 (three) times daily. 03/09/18   Elvina SidleLauenstein, Kurt, MD  ondansetron (ZOFRAN) 4 MG tablet Take 1 tablet (4 mg total) by mouth every 8 (eight) hours. 03/22/18   Eustace MooreNelson, Yvonne Sue, MD  promethazine (PHENERGAN) 25 MG tablet Take 1 tablet (25 mg total) by mouth every 8 (eight) hours as needed for nausea or vomiting. 07/23/18   Roxy HorsemanBrowning, Jeane Cashatt, PA-C  tiZANidine (ZANAFLEX) 4 MG tablet Take 1 tablet (4 mg total) by mouth every 8 (eight) hours as needed for muscle spasms. 03/27/18   Hoy RegisterNewlin, Enobong, MD    Family History Family History  Problem Relation Age of Onset  . Parkinson's disease Mother   . Hypertension Mother   . Parkinson's disease Maternal Grandmother   . Hypertension Maternal Grandmother   . Cancer Maternal Grandmother        lung  . Glaucoma Maternal Grandmother      Social History Social History   Tobacco Use  . Smoking status: Current Some Day Smoker    Packs/day: 0.50    Years: 0.50    Pack years: 0.25    Types: Cigarettes  . Smokeless tobacco: Never Used  . Tobacco comment: 1/2 pack weekly  Substance Use Topics  . Alcohol use: No  . Drug use: Yes    Types: Heroin, Marijuana    Comment:  smoked marijuana 4 days ago.  hasn't used heroin since 06/01/2014     Allergies   Haldol [haloperidol decanoate] and Nitrates, organic   Review of Systems Review of Systems  All other systems reviewed and are negative.    Physical Exam Updated Vital Signs BP (!) 161/89 (BP Location: Right Arm)   Pulse 76   Temp 98 F (36.7 C) (Oral)   Resp 16   Ht 5\' 4"  (1.626 m)   Wt 104.3 kg Comment: Pt says between 180 and 280  SpO2 98%   BMI 39.48 kg/m   Physical Exam Vitals signs and nursing note reviewed.  Constitutional:      Appearance: She is well-developed.  HENT:     Head: Normocephalic and atraumatic.  Eyes:     Conjunctiva/sclera: Conjunctivae normal.     Pupils: Pupils are equal, round, and reactive to light.  Neck:     Musculoskeletal: Normal range of motion and neck supple.  Cardiovascular:     Rate and Rhythm: Normal rate and regular rhythm.     Heart sounds: No murmur. No friction rub. No gallop.   Pulmonary:     Effort: Pulmonary effort is normal. No respiratory distress.     Breath sounds: Normal breath sounds. No wheezing or rales.  Chest:     Chest wall: No tenderness.  Abdominal:     General: Bowel sounds are normal. There is no distension.     Palpations: Abdomen is soft. There is no mass.     Tenderness: There is no abdominal tenderness. There is no guarding or rebound.     Comments: No focal abdominal tenderness, no RLQ tenderness or pain at McBurney's point, no RUQ tenderness or Murphy's sign, no left-sided abdominal tenderness, no fluid wave, or signs of peritonitis   Musculoskeletal: Normal range of motion.         General: No tenderness.  Skin:    General: Skin is warm and dry.  Neurological:     Mental Status: She is alert and oriented to  person, place, and time.  Psychiatric:        Behavior: Behavior normal.        Thought Content: Thought content normal.        Judgment: Judgment normal.      ED Treatments / Results  Labs (all labs ordered are listed, but only abnormal results are displayed) Labs Reviewed - No data to display  EKG None  Radiology No results found.  Procedures Procedures (including critical care time)  Medications Ordered in ED Medications - No data to display   Initial Impression / Assessment and Plan / ED Course  I have reviewed the triage vital signs and the nursing notes.  Pertinent labs & imaging results that were available during my care of the patient were reviewed by me and considered in my medical decision making (see chart for details).     Patient with recurrent episodes of vomiting in the morning for the past 6 months.  She is in no acute distress on my exam.  Her vital signs are stable.  She has no focal abdominal tenderness.  I discussed with the patient that it is unlikely that we will be able to find an emergent cause of her symptoms given the longevity of her symptoms.  At this point, the patient states that she does not want to have any blood work done if we do not think that it would be beneficial tonight.  I did offer it, but she declines.  She will be given a refill of her Phenergan.  I have also put in a consult to social work and case management to see if they are able to assist the patient in getting closer follow-up and or follow-up with a gastroenterologist.  Patient is agreeable with this plan and would like to be discharged without any further work-up.  I feel this is appropriate and that she has been adequately medically screened.  Final Clinical Impressions(s) / ED Diagnoses   Final diagnoses:  Vomiting, intractability of  vomiting not specified, presence of nausea not specified, unspecified vomiting type    ED Discharge Orders         Ordered    promethazine (PHENERGAN) 25 MG tablet  Every 8 hours PRN     07/23/18 0446           Roxy Horseman, PA-C 07/23/18 0450    Devoria Albe, MD 07/23/18 0500

## 2018-07-25 MED FILL — hydrOXYzine HCL 25 MG TABS: 25 | 30 days supply | Qty: 90 | Fill #3

## 2018-07-25 MED FILL — GABAPENTIN 300 MG CAPSULE: 300 | 30 days supply | Qty: 60 | Fill #3

## 2018-07-27 ENCOUNTER — Ambulatory Visit: Payer: Self-pay | Attending: Family Medicine | Admitting: Family Medicine

## 2018-07-27 ENCOUNTER — Encounter: Payer: Self-pay | Admitting: Family Medicine

## 2018-07-27 VITALS — BP 106/67 | HR 60 | Temp 98.3°F | Ht 64.0 in | Wt 226.4 lb

## 2018-07-27 DIAGNOSIS — R112 Nausea with vomiting, unspecified: Secondary | ICD-10-CM

## 2018-07-27 DIAGNOSIS — B182 Chronic viral hepatitis C: Secondary | ICD-10-CM

## 2018-07-27 DIAGNOSIS — G43809 Other migraine, not intractable, without status migrainosus: Secondary | ICD-10-CM

## 2018-07-27 DIAGNOSIS — M533 Sacrococcygeal disorders, not elsewhere classified: Secondary | ICD-10-CM

## 2018-07-27 DIAGNOSIS — F329 Major depressive disorder, single episode, unspecified: Secondary | ICD-10-CM

## 2018-07-27 DIAGNOSIS — L0292 Furuncle, unspecified: Secondary | ICD-10-CM

## 2018-07-27 DIAGNOSIS — F419 Anxiety disorder, unspecified: Secondary | ICD-10-CM

## 2018-07-27 MED ORDER — SUMATRIPTAN SUCCINATE 100 MG PO TABS: 100.0000 mg | ORAL_TABLET | Freq: Once | ORAL | 1 refills | Status: DC

## 2018-07-27 MED ORDER — AMITRIPTYLINE HCL 10 MG PO TABS: 10.0000 mg | ORAL_TABLET | Freq: Every day | ORAL | 3 refills | Status: DC

## 2018-07-27 MED ORDER — CHLORHEXIDINE GLUCONATE 4 % EX LIQD: Freq: Every day | CUTANEOUS | 0 refills | Status: DC | PRN

## 2018-07-27 MED ORDER — GABAPENTIN 300 MG PO CAPS: 300.0000 mg | ORAL_CAPSULE | Freq: Two times a day (BID) | ORAL | 3 refills | Status: DC

## 2018-07-27 MED ORDER — MELOXICAM 7.5 MG PO TABS: 7.5000 mg | ORAL_TABLET | Freq: Every day | ORAL | 6 refills | Status: DC

## 2018-07-27 MED ORDER — HYDROXYZINE HCL 25 MG PO TABS: 25.0000 mg | ORAL_TABLET | Freq: Three times a day (TID) | ORAL | 6 refills | Status: DC | PRN

## 2018-07-27 MED ORDER — PROMETHAZINE HCL 25 MG PO TABS: 25.0000 mg | ORAL_TABLET | Freq: Three times a day (TID) | ORAL | 0 refills | Status: DC | PRN

## 2018-07-27 MED FILL — AMITRIPTYLINE HCL 10 MG TAB: 10 | 30 days supply | Qty: 30 | Fill #0

## 2018-07-27 NOTE — Progress Notes (Signed)
Patient is having real bad headaches when she wakes in the morning.

## 2018-07-27 NOTE — Progress Notes (Signed)
Subjective:  Patient ID: Nancy Terry, female    DOB: [birth year]  Age: 43 y.o. MRN: [id number]  CC: Hospitalization Follow-up   HPI Nancy Terry is a 43 year old female with a history of bipolar disorder, anxiety and depression, previous history of opioid abuse (currently on methadone), migraines, insomnia, GERD, sacroiliac joint dysfunction who presents today for a follow-up visit. She complains of breaking out in her bilateral axilla and in between her thighs but denies drainage or discharge from them. Also has persistent headaches which are daily.  Endorses photophobia, phonophobia.  Tried Fioricet, Topamax in the past which were ineffective but recalls doing well on Imitrex. She would like to be checked for hepatitis C again as she states her blood test revealed she was positive. Her back symptoms have improved after her cortisone injections and she is undergoing PT.  She had an ED visit a couple of days ago for persisting vomiting for which she has to remain on Phenergan.  Referred to GI and has an upcoming appointment on 08/09/2018. Denies reflux, chest pains, diarrhea or constipation.  Past Medical History:  Diagnosis Date  . Anxiety   . Depression   . Hepatitis C   . Herpes infection   . Hypertension   . Kidney stones   . Migraines   . Narcotic abuse [facility])     Past Surgical History:  Procedure Laterality Date  . arm surgery    . left elbow surgery     . TUBAL LIGATION      Family History  Problem Relation Age of Onset  . Parkinson's disease Mother   . Hypertension Mother   . Parkinson's disease Maternal Grandmother   . Hypertension Maternal Grandmother   . Cancer Maternal Grandmother        lung  . Glaucoma Maternal Grandmother     Allergies  Allergen Reactions  . Haldol [Haloperidol Decanoate] Other (See Comments)    Hallucinations  . Nitrates, Organic Other (See Comments)    headaches    Outpatient Medications Prior to Visit  Medication Sig Dispense  Refill  . methadone (DOLOPHINE) 10 MG/5ML solution Take 80 mg by mouth every other day.     . gabapentin (NEURONTIN) 300 MG capsule Take 1 capsule (300 mg total) by mouth 2 (two) times daily. 60 capsule 3  . hydrOXYzine (ATARAX/VISTARIL) 25 MG tablet Take 1 tablet (25 mg total) by mouth 3 (three) times daily as needed. 90 tablet 3  . promethazine (PHENERGAN) 25 MG tablet Take 1 tablet (25 mg total) by mouth every 8 (eight) hours as needed for nausea or vomiting. 20 tablet 0  . ergocalciferol (DRISDOL) 50000 units capsule Take 1 capsule (50,000 Units total) by mouth once a week. (Patient not taking: Reported on 07/27/2018) 9 capsule 1  . cephALEXin (KEFLEX) 500 MG capsule Take 1 capsule (500 mg total) by mouth 2 (two) times daily. (Patient not taking: Reported on 07/27/2018) 14 capsule 0  . chlorhexidine (HIBICLENS) 4 % external liquid Apply topically daily as needed. (Patient not taking: Reported on 07/27/2018) 120 mL 0  . doxycycline (VIBRA-TABS) 100 MG tablet Take 1 tablet (100 mg total) by mouth 2 (two) times daily. (Patient not taking: Reported on 07/27/2018) 20 tablet 2  . meloxicam (MOBIC) 7.5 MG tablet Take 1 tablet (7.5 mg total) by mouth daily. (Patient not taking: Reported on 07/27/2018) 30 tablet 3  . mupirocin ointment (BACTROBAN) 2 % Apply 1 application topically 3 (three) times daily. (Patient not taking: Reported on 07/27/2018) 30  g 2  . ondansetron (ZOFRAN) 4 MG tablet Take 1 tablet (4 mg total) by mouth every 8 (eight) hours. (Patient not taking: Reported on 07/27/2018) 12 tablet 0  . tiZANidine (ZANAFLEX) 4 MG tablet Take 1 tablet (4 mg total) by mouth every 8 (eight) hours as needed for muscle spasms. (Patient not taking: Reported on 07/27/2018) 90 tablet 3   No facility-administered medications prior to visit.      ROS Review of Systems General: negative for fever, weight loss, appetite change Eyes: no visual symptoms. ENT: no ear symptoms, no sinus tenderness, no nasal congestion  or sore throat. Neck: no pain  Respiratory: no wheezing, shortness of breath, cough Cardiovascular: no chest pain, no dyspnea on exertion, no pedal edema, no orthopnea. Gastrointestinal: no abdominal pain, no diarrhea, no constipation, + nausea and vomiting Genito-Urinary: no urinary frequency, no dysuria, no polyuria. Hematologic: no bruising Endocrine: no cold or heat intolerance Neurological: +headaches, no seizures, no tremors Musculoskeletal: no joint pains, no joint swelling Skin: no pruritus, no rash. Psychological: no depression, no anxiety,    Objective:  BP 106/67   Pulse 60   Temp 98.3 F (36.8 C) (Oral)   Ht 5\' 4"  (1.626 m)   Wt 226 lb 6.4 oz (102.7 kg)   SpO2 96%   BMI 38.86 kg/m   BP/Weight 07/27/2018 07/23/2018 05/22/2018  Systolic BP 106 161 155  Diastolic BP 67 89 83  Wt. (Lbs) 226.4 230 250  BMI 38.86 39.48 42.91      Physical Exam Constitutional: normal appearing,  Eyes: PERRLA HEENT: Head is atraumatic, normal sinuses, normal oropharynx, normal appearing tonsils and palate, tympanic membrane is normal bilaterally. Neck: normal range of motion, no thyromegaly, no JVD Cardiovascular: normal rate and rhythm, normal heart sounds, no murmurs, rub or gallop, no pedal edema Respiratory: Normal breath sounds, clear to auscultation bilaterally, no wheezes, no rales, no rhonchi Abdomen: soft, not tender to palpation, normal bowel sounds, no enlarged organs Musculoskeletal: Full ROM, no tenderness in joints Skin: warm and dry, and bilateralb underarm and left medial thigh with no punctum or drainage, slightly tender. Neurological: alert, oriented x3, cranial nerves I-XII grossly intact , normal motor strength, normal sensation. Psychological: normal mood.   CMP Latest Ref Rng & Units 12/23/2017 11/06/2017 03/22/2017  Glucose 70 - 99 mg/dL 962(I) 297(L) 84  BUN 6 - 20 mg/dL 8 7 10   Creatinine 0.44 - 1.00 mg/dL 8.92 1.19 4.17  Sodium 135 - 145 mmol/L 141 136 139    Potassium 3.5 - 5.1 mmol/L 3.1(L) 3.6 4.3  Chloride 98 - 111 mmol/L 105 105 108(H)  CO2 22 - 32 mmol/L - 21(L) 15(L)  Calcium 8.9 - 10.3 mg/dL - 9.1 9.0  Total Protein 6.5 - 8.1 g/dL - 7.8 7.6  Total Bilirubin 0.3 - 1.2 mg/dL - 0.3 0.4  Alkaline Phos 38 - 126 U/L - 80 85  AST 15 - 41 U/L - 32 24  ALT 14 - 54 U/L - 31 15    Lipid Panel     Component Value Date/Time   CHOL 102 03/22/2017 0947   TRIG 130 03/22/2017 0947   HDL 24 (L) 03/22/2017 0947   CHOLHDL 4.3 03/22/2017 0947   CHOLHDL 4.9 06/14/2014 0851   VLDL 31 06/14/2014 0851   LDLCALC 52 03/22/2017 0947    CBC    Component Value Date/Time   WBC 9.5 03/27/2018 1442   WBC 10.9 (H) 11/06/2017 0526   RBC 4.66 03/27/2018 1442   RBC  4.83 11/06/2017 0526   HGB 12.5 03/27/2018 1442   HCT 37.5 03/27/2018 1442   PLT 362 03/27/2018 1442   MCV 81 03/27/2018 1442   MCH 26.8 03/27/2018 1442   MCH 27.5 11/06/2017 0526   MCHC 33.3 03/27/2018 1442   MCHC 33.0 11/06/2017 0526   RDW 14.3 03/27/2018 1442   LYMPHSABS 2.8 03/27/2018 1442   MONOABS 0.9 02/01/2015 1215   MONOABS 0.7 02/01/2015 1215   EOSABS 0.3 03/27/2018 1442   BASOSABS 0.1 03/27/2018 1442    Lab Results  Component Value Date   HGBA1C 5.3 06/14/2014    Assessment & Plan:   1. Sacroiliac dysfunction Status post injections Improved Undergoing PT - gabapentin (NEURONTIN) 300 MG capsule; Take 1 capsule (300 mg total) by mouth 2 (two) times daily.  Dispense: 60 capsule; Refill: 3 - meloxicam (MOBIC) 7.5 MG tablet; Take 1 tablet (7.5 mg total) by mouth daily.  Dispense: 30 tablet; Refill: 6  2. Furunculosis No indication for antibiotic at this time - chlorhexidine (HIBICLENS) 4 % external liquid; Apply topically daily as needed.  Dispense: 120 mL; Refill: 0  3. Anxiety and depression Controlled - hydrOXYzine (ATARAX/VISTARIL) 25 MG tablet; Take 1 tablet (25 mg total) by mouth 3 (three) times daily as needed.  Dispense: 90 tablet; Refill: 6  4.  Non-intractable vomiting with nausea, unspecified vomiting type Uncontrolled Scheduled to see GI Will need upper endoscopy - promethazine (PHENERGAN) 25 MG tablet; Take 1 tablet (25 mg total) by mouth every 8 (eight) hours as needed for nausea or vomiting.  Dispense: 20 tablet; Refill: 0  5. Chronic hepatitis C without hepatic coma (HCC) History of hepatitis C but she is requesting repeat testing - Hepatitis c antibody (reflex) - HCV RNA quant  6. Other migraine without status migrainosus, not intractable Did not do well with Topamax and Fioricet in the past Commence Elavil for prevention and Imitrex for breakthrough - amitriptyline (ELAVIL) 10 MG tablet; Take 1 tablet (10 mg total) by mouth at bedtime.  Dispense: 30 tablet; Refill: 3 - SUMAtriptan (IMITREX) 100 MG tablet; Take 1 tablet (100 mg total) by mouth once for 1 dose. May repeat in 2 hours if headache persists or recurs.Max- 200mg /day  Dispense: 10 tablet; Refill: 1   Meds ordered this encounter  Medications  . gabapentin (NEURONTIN) 300 MG capsule    Sig: Take 1 capsule (300 mg total) by mouth 2 (two) times daily.    Dispense:  60 capsule    Refill:  3  . amitriptyline (ELAVIL) 10 MG tablet    Sig: Take 1 tablet (10 mg total) by mouth at bedtime.    Dispense:  30 tablet    Refill:  3  . chlorhexidine (HIBICLENS) 4 % external liquid    Sig: Apply topically daily as needed.    Dispense:  120 mL    Refill:  0  . hydrOXYzine (ATARAX/VISTARIL) 25 MG tablet    Sig: Take 1 tablet (25 mg total) by mouth 3 (three) times daily as needed.    Dispense:  90 tablet    Refill:  6  . meloxicam (MOBIC) 7.5 MG tablet    Sig: Take 1 tablet (7.5 mg total) by mouth daily.    Dispense:  30 tablet    Refill:  6  . promethazine (PHENERGAN) 25 MG tablet    Sig: Take 1 tablet (25 mg total) by mouth every 8 (eight) hours as needed for nausea or vomiting.    Dispense:  20 tablet  Refill:  0  . SUMAtriptan (IMITREX) 100 MG tablet    Sig:  Take 1 tablet (100 mg total) by mouth once for 1 dose. May repeat in 2 hours if headache persists or recurs.Max- 200mg /day    Dispense:  10 tablet    Refill:  1    Follow-up: Return in about 6 months (around 01/25/2019) for follow up of chronic medical conditions.       Hoy RegisterEnobong Hutch Rhett, MD, FAAFP. [facility] [place], [place] [phone]   07/27/2018, 11:36 AM

## 2018-07-28 LAB — COMMENT2 - HEP PANEL

## 2018-07-28 LAB — HEPATITIS C ANTIBODY (REFLEX): HCV Ab: >11 s/co ratio — ABNORMAL HIGH (ref 0.0–0.9)

## 2018-07-29 LAB — HCV RNA QUANT
HCV log10: 4.641 log10 IU/mL
Hepatitis C Quantitation: 43800 IU/mL

## 2018-08-01 ENCOUNTER — Other Ambulatory Visit: Payer: Self-pay

## 2018-08-01 ENCOUNTER — Other Ambulatory Visit: Payer: Self-pay | Admitting: Family Medicine

## 2018-08-01 DIAGNOSIS — B182 Chronic viral hepatitis C: Secondary | ICD-10-CM

## 2018-08-01 MED ORDER — TETANUS-DIPHTH-ACELL PERTUSSIS 5-2.5-18.5 LF-MCG/0.5 IM SUSP: 0.5000 mL | INTRAMUSCULAR | 0 refills | Status: DC

## 2018-08-08 ENCOUNTER — Telehealth: Payer: Self-pay

## 2018-08-08 NOTE — Telephone Encounter (Signed)
Patient was called and a voicemail was left informing patient to return phone all for lab results.

## 2018-08-08 NOTE — Telephone Encounter (Signed)
Patient returned phone call and was informed of lab results. 

## 2018-08-08 NOTE — Telephone Encounter (Signed)
-----   Message from Hoy Register, MD sent at 08/01/2018  5:37 PM EST ----- Lab results revealed the presence of hepatitis C.  I have referred her to infectious disease for treatment

## 2018-08-08 NOTE — Telephone Encounter (Signed)
-----   Message from Enobong Newlin, MD sent at 08/01/2018  5:37 PM EST ----- Lab results revealed the presence of hepatitis C.  I have referred her to infectious disease for treatment 

## 2018-08-16 NOTE — Progress Notes (Deleted)
Patient Name: Nancy Terry  Date of Birth: [birth year]  MRN: [id number]  PCP: Hoy Register, MD  Referring Provider: Hoy Register, MD   Patient Active Problem List   Diagnosis Date Noted  . Chronic pain syndrome 04/11/2018  . Complex ovarian cyst 11/21/2017  . Hypertension 11/21/2017  . Menorrhagia 05/13/2017  . Sciatica 05/13/2017  . Migraines 05/07/2016  . Hepatitis C 05/07/2016  . Depression 04/05/2016  . Anxiety and depression 04/05/2016  . Narcotic abuse (HCC) 04/05/2016  . Breast lump on right side at 10 o'clock position 03/12/2014  . Pap smear of cervix shows high risk HPV present 03/12/2014   CC: New patient here for evaluation of Hepatitis C treatment. ***.   HPI:  Nancy Terry is a 43 y.o. female with pmhx bipolar disorder, anxiety, depression, previous opioid abuse (methadone replacement therapy currently), migraines, insomnia, GERD, SI joint dysfunction.  She underwent routine screening with PCP a few weeks ago and was found to have positive hep c antibody and viral load 43,000. HIV test negative.   Patient tested positive {time; misc:30499}. Hepatitis C-associated risk factors present are: {hep c risks pos:13207}. Patient denies {hep c risks neg:13208}. Patient {has/not:15037} had other studies performed. Results: {hep c studies:13209}. Patient {has/not:15037} had prior treatment for Hepatitis C. Patient {does/do/not:33181} have a past history of liver disease. Patient {does/do/not:33181} have a family history of liver disease. Patient {does/do/not:33181}  have associated signs or symptoms related to liver disease.  Labs reviewed and confirm chronic hepatitis C with a positive viral load.   Records reviewed from ***  ***  Patient {does/does not:19866} have documented immunity to Hepatitis A. Patient {does/does not:19867} have documented immunity to Hepatitis B.    {ros - complete:22885} All other systems reviewed and are negative      Past Medical History:   Diagnosis Date  . Anxiety   . Depression   . Hepatitis C   . Herpes infection   . Hypertension   . Kidney stones   . Migraines   . Narcotic abuse (HCC)     Prior to Admission medications   Medication Sig Start Date End Date Taking? Authorizing Provider  amitriptyline (ELAVIL) 10 MG tablet Take 1 tablet (10 mg total) by mouth at bedtime. 07/27/18   Hoy Register, MD  chlorhexidine (HIBICLENS) 4 % external liquid Apply topically daily as needed. 07/27/18   Hoy Register, MD  ergocalciferol (DRISDOL) 50000 units capsule Take 1 capsule (50,000 Units total) by mouth once a week. Patient not taking: Reported on 07/27/2018 03/29/18   Hoy Register, MD  gabapentin (NEURONTIN) 300 MG capsule Take 1 capsule (300 mg total) by mouth 2 (two) times daily. 07/27/18   Hoy Register, MD  hydrOXYzine (ATARAX/VISTARIL) 25 MG tablet Take 1 tablet (25 mg total) by mouth 3 (three) times daily as needed. 07/27/18   Hoy Register, MD  meloxicam (MOBIC) 7.5 MG tablet Take 1 tablet (7.5 mg total) by mouth daily. 07/27/18   Hoy Register, MD  methadone (DOLOPHINE) 10 MG/5ML solution Take 80 mg by mouth every other day.     [provider]  promethazine (PHENERGAN) 25 MG tablet Take 1 tablet (25 mg total) by mouth every 8 (eight) hours as needed for nausea or vomiting. 07/27/18   Hoy Register, MD  SUMAtriptan (IMITREX) 100 MG tablet Take 1 tablet (100 mg total) by mouth once for 1 dose. May repeat in 2 hours if headache persists or recurs.Max- 200mg /day 07/27/18 07/27/18  Hoy Register, MD  Tdap Leda Min) 5-2.5-18.5 LF-MCG/0.5  injection Inject 0.5 mLs into the muscle as directed. 08/01/18   Hoy Register, MD    Allergies  Allergen Reactions  . Haldol [Haloperidol Decanoate] Other (See Comments)    Hallucinations  . Nitrates, Organic Other (See Comments)    headaches    Social History   Tobacco Use  . Smoking status: Current Some Day Smoker    Packs/day: 0.50    Years: 0.50    Pack  years: 0.25    Types: Cigarettes  . Smokeless tobacco: Never Used  . Tobacco comment: 1/2 pack weekly  Substance Use Topics  . Alcohol use: No  . Drug use: Yes    Types: Heroin, Marijuana    Comment:  smoked marijuana 4 days ago.  hasn't used heroin since 06/01/2014    Family History  Problem Relation Age of Onset  . Parkinson's disease Mother   . Hypertension Mother   . Parkinson's disease Maternal Grandmother   . Hypertension Maternal Grandmother   . Cancer Maternal Grandmother        lung  . Glaucoma Maternal Grandmother    ***  Objective:  There were no vitals filed for this visit. Constitutional: {EXAM; GENERAL APPEARANCE:5021} Eyes: anicteric Cardiovascular: {Mis exam cardio:32073} Respiratory: {Exam; lungs brief:12271} Gastrointestinal: {Exam; abdomen:5794::"Bowel sounds are normal","liver is not enlarged","spleen is not enlarged"} Musculoskeletal: {extremities:315109::"peripheral pulses normal, no pedal edema, no clubbing or cyanosis"} Skin: {Skin exam gi:12013}; no porphyria cutanea tarda Lymphatic: no cervical lymphadenopathy   Laboratory: Genotype: No results found for: HCVGENOTYPE HCV viral load: No results found for: HCVQUANT Lab Results  Component Value Date   WBC 9.5 03/27/2018   HGB 12.5 03/27/2018   HCT 37.5 03/27/2018   MCV 81 03/27/2018   PLT 362 03/27/2018    Lab Results  Component Value Date   CREATININE 0.70 12/23/2017   BUN 8 12/23/2017   NA 141 12/23/2017   K 3.1 (L) 12/23/2017   CL 105 12/23/2017   CO2 21 (L) 11/06/2017    Lab Results  Component Value Date   ALT 31 11/06/2017   AST 32 11/06/2017   ALKPHOS 80 11/06/2017    Lab Results  Component Value Date   BILITOT 0.3 11/06/2017   ALBUMIN 4.0 11/06/2017    Labs and history reviewed and show CHILD-PUGH *** 5-6 points: Child class A 7-9 points: Child class B 10-15 points: Child class C  Imaging:  Assessment & Plan:   Problem List Items Addressed This Visit    None       I spent 45 minutes with the patient including greater than 70% of time in face to face counsel of the patient re hepatitis c and the details described above and in coordination of their care.  Rexene Alberts, MSN, NP-C [facility]  [email or web address] Pager: [phone] Office: [phone]

## 2018-08-17 ENCOUNTER — Encounter: Payer: Medicaid Other | Admitting: Infectious Diseases

## 2018-08-18 ENCOUNTER — Emergency Department (HOSPITAL_COMMUNITY)
Admission: EM | Admit: 2018-08-18 | Discharge: 2018-08-18 | Discharge status: Absent without leave | Payer: Medicaid Other

## 2018-08-22 ENCOUNTER — Telehealth: Payer: Self-pay | Admitting: Family Medicine

## 2018-08-22 NOTE — Telephone Encounter (Signed)
Pt called in stating she tried to reach infectious disease clinic to schedule apt several times and couldn't get through would like help scheduling her appt with them please follow up

## 2018-08-23 ENCOUNTER — Other Ambulatory Visit: Payer: Medicaid Other | Admitting: Family Medicine

## 2018-08-23 NOTE — Telephone Encounter (Signed)
Patient was called and a voicemail was left informing patient to return phone call. 

## 2018-08-26 ENCOUNTER — Encounter (HOSPITAL_BASED_OUTPATIENT_CLINIC_OR_DEPARTMENT_OTHER): Payer: Self-pay | Admitting: Emergency Medicine

## 2018-08-26 ENCOUNTER — Emergency Department (HOSPITAL_BASED_OUTPATIENT_CLINIC_OR_DEPARTMENT_OTHER)
Admission: EM | Admit: 2018-08-26 | Discharge: 2018-08-26 | Disposition: A | Discharge status: Home / Self Care | Payer: Medicaid Other | Attending: Emergency Medicine | Admitting: Emergency Medicine

## 2018-08-26 ENCOUNTER — Emergency Department (HOSPITAL_BASED_OUTPATIENT_CLINIC_OR_DEPARTMENT_OTHER): Payer: Medicaid Other

## 2018-08-26 ENCOUNTER — Other Ambulatory Visit: Payer: Self-pay

## 2018-08-26 DIAGNOSIS — R1110 Vomiting, unspecified: Secondary | ICD-10-CM | POA: Insufficient documentation

## 2018-08-26 DIAGNOSIS — F17210 Nicotine dependence, cigarettes, uncomplicated: Secondary | ICD-10-CM | POA: Insufficient documentation

## 2018-08-26 DIAGNOSIS — Z79899 Other long term (current) drug therapy: Secondary | ICD-10-CM | POA: Insufficient documentation

## 2018-08-26 DIAGNOSIS — R1084 Generalized abdominal pain: Secondary | ICD-10-CM | POA: Insufficient documentation

## 2018-08-26 DIAGNOSIS — I10 Essential (primary) hypertension: Secondary | ICD-10-CM | POA: Insufficient documentation

## 2018-08-26 LAB — URINALYSIS, ROUTINE W REFLEX MICROSCOPIC
Bilirubin Urine: NEGATIVE
Glucose, UA: NEGATIVE mg/dL
Ketones, ur: NEGATIVE mg/dL
Leukocytes,Ua: NEGATIVE
Nitrite: NEGATIVE
Protein, ur: NEGATIVE mg/dL
Specific Gravity, Urine: 1.025 (ref 1.005–1.030)
pH: 6 (ref 5.0–8.0)

## 2018-08-26 LAB — CBC WITH DIFFERENTIAL/PLATELET
ABS IMMATURE GRANULOCYTES: 0.02 10*3/uL (ref 0.00–0.07)
Basophils Absolute: 0.1 10*3/uL (ref 0.0–0.1)
Basophils Relative: 1 %
Eosinophils Absolute: 0.3 10*3/uL (ref 0.0–0.5)
Eosinophils Relative: 3 %
HCT: 38.2 % (ref 36.0–46.0)
Hemoglobin: 11.4 g/dL — ABNORMAL LOW (ref 12.0–15.0)
Immature Granulocytes: 0 %
Lymphocytes Relative: 30 %
Lymphs Abs: 2.3 10*3/uL (ref 0.7–4.0)
MCH: 24.9 pg — ABNORMAL LOW (ref 26.0–34.0)
MCHC: 29.8 g/dL — ABNORMAL LOW (ref 30.0–36.0)
MCV: 83.6 fL (ref 80.0–100.0)
Monocytes Absolute: 0.7 10*3/uL (ref 0.1–1.0)
Monocytes Relative: 10 %
NEUTROS ABS: 4.2 10*3/uL (ref 1.7–7.7)
Neutrophils Relative %: 56 %
Platelets: 300 10*3/uL (ref 150–400)
RBC: 4.57 MIL/uL (ref 3.87–5.11)
RDW: 15.8 % — ABNORMAL HIGH (ref 11.5–15.5)
WBC: 7.6 10*3/uL (ref 4.0–10.5)
nRBC: 0 % (ref 0.0–0.2)

## 2018-08-26 LAB — COMPREHENSIVE METABOLIC PANEL
ALBUMIN: 3.9 g/dL (ref 3.5–5.0)
ALT: 69 U/L — ABNORMAL HIGH (ref 0–44)
AST: 58 U/L — ABNORMAL HIGH (ref 15–41)
Alkaline Phosphatase: 73 U/L (ref 38–126)
Anion gap: 5 (ref 5–15)
BILIRUBIN TOTAL: 0.5 mg/dL (ref 0.3–1.2)
BUN: 11 mg/dL (ref 6–20)
CO2: 25 mmol/L (ref 22–32)
Calcium: 9.2 mg/dL (ref 8.9–10.3)
Chloride: 107 mmol/L (ref 98–111)
Creatinine, Ser: 0.82 mg/dL (ref 0.44–1.00)
GFR calc Af Amer: >60 mL/min (ref >60)
GFR calc non Af Amer: >60 mL/min (ref >60)
Glucose, Bld: 101 mg/dL — ABNORMAL HIGH (ref 70–99)
Potassium: 3.7 mmol/L (ref 3.5–5.1)
Sodium: 137 mmol/L (ref 135–145)
Total Protein: 7.4 g/dL (ref 6.5–8.1)

## 2018-08-26 LAB — URINALYSIS, MICROSCOPIC (REFLEX)

## 2018-08-26 LAB — PREGNANCY, URINE: PREG TEST UR: NEGATIVE

## 2018-08-26 LAB — LIPASE, BLOOD: Lipase: 23 U/L (ref 11–51)

## 2018-08-26 NOTE — Discharge Instructions (Addendum)
You had labs performed today in the emergency department that showed mild elevation in your liver enzymes. Please follow-up with your family doctor for recheck.   You can take ibuprofen, available over-the-counter according to label instructions for pain.   You can take MiraLAX and Colace, available over-the-counter for constipation.   Get rechecked immediately if you develop any new or concerning symptoms.

## 2018-08-26 NOTE — ED Notes (Signed)
ED Provider at bedside. 

## 2018-08-26 NOTE — ED Triage Notes (Signed)
Pt states she started throwing up since yesterday morning. Pt states she has bilateral flank pain that started thursday. Pt thinks she has a kidney stone

## 2018-08-26 NOTE — ED Provider Notes (Signed)
[facility] EMERGENCY DEPARTMENT Provider Note   CSN: [id number] Arrival date & time: 08/26/18  1704    History   Chief Complaint Chief Complaint  Patient presents with  . Emesis  . Flank Pain    HPI Nancy Terry is a 43 y.o. female.     The history is provided by the patient and medical records. No language interpreter was used.  Emesis  Flank Pain    Nancy Terry is a 43 y.o. female who presents to the Emergency Department complaining of back pain, vomiting. She presents to the emergency department for evaluation of back pain and vomiting. Symptoms began three days ago with bilateral low back pain that radiates to her lower abdomen bilaterally. Symptoms are constant and a pounding sensation. There are no clear alleviating or worsening factors. She has tried hot water, Tylenol and ibuprofen at home with no significant change in her symptoms. She denies any fevers. She did have some emesis yesterday but does have intermittent emesis chronically. No diarrhea, dysuria. She does have chronic constipation and had a soft bowel movement yesterday. No vaginal discharge. She does take methadone in her last dose was yesterday. She smokes cigarettes. Denies alcohol use. She does use marijuana. Past Medical History:  Diagnosis Date  . Anxiety   . Depression   . Hepatitis C   . Herpes infection   . Hypertension   . Kidney stones   . Migraines   . Narcotic abuse [facility])     Patient Active Problem List   Diagnosis Date Noted  . Chronic pain syndrome 04/11/2018  . Complex ovarian cyst 11/21/2017  . Hypertension 11/21/2017  . Menorrhagia 05/13/2017  . Sciatica 05/13/2017  . Migraines 05/07/2016  . Hepatitis C 05/07/2016  . Depression 04/05/2016  . Anxiety and depression 04/05/2016  . Narcotic abuse (HCC) 04/05/2016  . Breast lump on right side at 10 o'clock position 03/12/2014  . Pap smear of cervix shows high risk HPV present 03/12/2014    Past Surgical History:   Procedure Laterality Date  . arm surgery    . left elbow surgery     . TUBAL LIGATION       OB History    Gravida  3   Para  3   Term  3   Preterm      AB      Living  3     SAB      TAB      Ectopic      Multiple      Live Births               Home Medications    Prior to Admission medications   Medication Sig Start Date End Date Taking? Authorizing Provider  amitriptyline (ELAVIL) 10 MG tablet Take 1 tablet (10 mg total) by mouth at bedtime. 07/27/18  Yes Hoy Register, MD  gabapentin (NEURONTIN) 300 MG capsule Take 1 capsule (300 mg total) by mouth 2 (two) times daily. 07/27/18  Yes Hoy Register, MD  hydrOXYzine (ATARAX/VISTARIL) 25 MG tablet Take 1 tablet (25 mg total) by mouth 3 (three) times daily as needed. 07/27/18  Yes Hoy Register, MD  meloxicam (MOBIC) 7.5 MG tablet Take 1 tablet (7.5 mg total) by mouth daily. 07/27/18  Yes Hoy Register, MD  methadone (DOLOPHINE) 10 MG/5ML solution Take 80 mg by mouth every other day.    Yes [provider]  promethazine (PHENERGAN) 25 MG tablet Take 1 tablet (25 mg total) by  mouth every 8 (eight) hours as needed for nausea or vomiting. 07/27/18  Yes Newlin, Enobong, MD  chlorhexidine (HIBICLENS) 4 % external liquid Apply topically daily as needed. 07/27/18   Hoy Register, MD  ergocalciferol (DRISDOL) 50000 units capsule Take 1 capsule (50,000 Units total) by mouth once a week. Patient not taking: Reported on 07/27/2018 03/29/18   Hoy Register, MD  SUMAtriptan (IMITREX) 100 MG tablet Take 1 tablet (100 mg total) by mouth once for 1 dose. May repeat in 2 hours if headache persists or recurs.Max- 200mg /day 07/27/18 07/27/18  Hoy Register, MD  Tdap (BOOSTRIX) 5-2.5-18.5 LF-MCG/0.5 injection Inject 0.5 mLs into the muscle as directed. 08/01/18   Hoy Register, MD    Family History Family History  Problem Relation Age of Onset  . Parkinson's disease Mother   . Hypertension Mother   . Parkinson's  disease Maternal Grandmother   . Hypertension Maternal Grandmother   . Cancer Maternal Grandmother        lung  . Glaucoma Maternal Grandmother     Social History Social History   Tobacco Use  . Smoking status: Current Some Day Smoker    Packs/day: 0.50    Years: 0.50    Pack years: 0.25    Types: Cigarettes  . Smokeless tobacco: Never Used  . Tobacco comment: 1/2 pack weekly  Substance Use Topics  . Alcohol use: No  . Drug use: Yes    Types: Heroin, Marijuana    Comment:  smoked marijuana 4 days ago.  hasn't used heroin since 06/01/2014     Allergies   Haldol [haloperidol decanoate] and Nitrates, organic   Review of Systems Review of Systems  Gastrointestinal: Positive for vomiting.  Genitourinary: Positive for flank pain.  All other systems reviewed and are negative.    Physical Exam Updated Vital Signs BP (!) 97/52 Comment: pt asleep  Pulse 61   Temp 97.7 F (36.5 C) (Oral)   Resp 14   LMP 07/10/2018 (Exact Date)   SpO2 98%   Physical Exam Vitals signs and nursing note reviewed.  Constitutional:      Appearance: She is well-developed.  HENT:     Head: Normocephalic and atraumatic.  Cardiovascular:     Rate and Rhythm: Normal rate and regular rhythm.     Heart sounds: No murmur.  Pulmonary:     Effort: Pulmonary effort is normal. No respiratory distress.     Breath sounds: Normal breath sounds.  Abdominal:     Palpations: Abdomen is soft.     Tenderness: There is no abdominal tenderness. There is no guarding or rebound.  Musculoskeletal:        General: No tenderness.     Comments: Nonpitting edema to bilateral lower extremities with venous stasis changes to bilateral lower extremities.  Skin:    General: Skin is warm and dry.  Neurological:     Mental Status: She is alert and oriented to person, place, and time.  Psychiatric:        Behavior: Behavior normal.      ED Treatments / Results  Labs (all labs ordered are listed, but only  abnormal results are displayed) Labs Reviewed  CBC WITH DIFFERENTIAL/PLATELET - Abnormal; Notable for the following components:      Result Value   Hemoglobin 11.4 (*)    MCH 24.9 (*)    MCHC 29.8 (*)    RDW 15.8 (*)    All other components within normal limits  URINALYSIS, ROUTINE W REFLEX MICROSCOPIC -  Abnormal; Notable for the following components:   Hgb urine dipstick TRACE (*)    All other components within normal limits  URINALYSIS, MICROSCOPIC (REFLEX) - Abnormal; Notable for the following components:   Bacteria, UA RARE (*)    All other components within normal limits  COMPREHENSIVE METABOLIC PANEL - Abnormal; Notable for the following components:   Glucose, Bld 101 (*)    AST 58 (*)    ALT 69 (*)    All other components within normal limits  PREGNANCY, URINE  LIPASE, BLOOD    EKG None  Radiology Dg Abd Acute W/chest  Result Date: 08/26/2018 CLINICAL DATA:  Bilateral flank pain. EXAM: DG ABDOMEN ACUTE W/ 1V CHEST COMPARISON:  None. FINDINGS: There is no evidence of dilated bowel loops or free intraperitoneal air. No radiopaque calculi or other significant radiographic abnormality is seen. Heart size and mediastinal contours are within normal limits. Both lungs are clear. IMPRESSION: Negative abdominal radiographs.  No acute cardiopulmonary disease. Electronically Signed   By: Kennith Center M.D.   On: 08/26/2018 19:16    Procedures Procedures (including critical care time)  Medications Ordered in ED Medications - No data to display   Initial Impression / Assessment and Plan / ED Course  I have reviewed the triage vital signs and the nursing notes.  Pertinent labs & imaging results that were available during my care of the patient were reviewed by me and considered in my medical decision making (see chart for details).        Patient presents for evaluation of back pain and abdominal pain. On initial assessment patient without significant abdominal tenderness.  She stated that palpation actually improved her pain. Labs are significant for mild elevation in her transaminases. Reviewed prior CT scan in June of last year. She had no obstructing stones at that time. Discussed with patient recommendation for further imaging of her abdomen given her elevation in liver enzymes and abdominal pain and patient declines. She wishes to go home at this time. She declines prescription for antiemetics. Discussed ibuprofen for pain control. Discussed treating constipation with over-the-counter remedies. Discussed outpatient follow-up and return precautions.  Presentation is not consistent with appendicitis, bowel obstruction, diverticulitis.  Final Clinical Impressions(s) / ED Diagnoses   Final diagnoses:  Generalized abdominal pain    ED Discharge Orders    None       Tilden Fossa, MD 08/26/18 1949

## 2018-08-26 NOTE — ED Notes (Signed)
Patient transported to X-ray 

## 2018-08-28 MED FILL — hydrOXYzine HCL 25 MG TABS: 25 | 30 days supply | Qty: 90 | Fill #0

## 2018-10-09 ENCOUNTER — Ambulatory Visit: Payer: Self-pay | Attending: Family Medicine | Admitting: Family Medicine

## 2018-10-09 ENCOUNTER — Encounter: Payer: Self-pay | Admitting: Family Medicine

## 2018-10-09 ENCOUNTER — Other Ambulatory Visit: Payer: Self-pay

## 2018-10-09 DIAGNOSIS — F419 Anxiety disorder, unspecified: Secondary | ICD-10-CM

## 2018-10-09 DIAGNOSIS — M533 Sacrococcygeal disorders, not elsewhere classified: Secondary | ICD-10-CM

## 2018-10-09 DIAGNOSIS — F329 Major depressive disorder, single episode, unspecified: Secondary | ICD-10-CM

## 2018-10-09 MED ORDER — GABAPENTIN 300 MG PO CAPS: 300.0000 mg | ORAL_CAPSULE | Freq: Two times a day (BID) | ORAL | 3 refills | Status: DC

## 2018-10-09 MED ORDER — DULOXETINE HCL 60 MG PO CPEP: 60.0000 mg | ORAL_CAPSULE | Freq: Every day | ORAL | 3 refills | Status: DC

## 2018-10-09 MED ORDER — HYDROXYZINE HCL 25 MG PO TABS: 25.0000 mg | ORAL_TABLET | Freq: Three times a day (TID) | ORAL | 6 refills | Status: DC | PRN

## 2018-10-09 MED ORDER — TIZANIDINE HCL 4 MG PO TABS: 4.0000 mg | ORAL_TABLET | Freq: Three times a day (TID) | ORAL | 1 refills | Status: DC | PRN

## 2018-10-09 MED FILL — ?DULoxetine HCL 6OMG CP: 60 | 30 days supply | Qty: 30 | Fill #0

## 2018-10-09 MED FILL — tiZANidine HCL 4 MG TABS: 4 | 30 days supply | Qty: 90 | Fill #0

## 2018-10-09 MED FILL — GABAPENTIN 300 MG CAPSULE: 300 | 30 days supply | Qty: 60 | Fill #0

## 2018-10-09 MED FILL — hydrOXYzine HCL 25 MG TABS: 25 | 30 days supply | Qty: 90 | Fill #0

## 2018-10-09 NOTE — Progress Notes (Signed)
Virtual Visit via Telephone Note  I connected with Nancy Terry, on 10/09/2018 at 3:44 PM by telephone and verified that I am speaking with the correct person using two identifiers.   Consent: I discussed the limitations, risks, security and privacy concerns of performing an evaluation and management service by telephone and the availability of in person appointments. I also discussed with the patient that there may be a patient responsible charge related to this service. The patient expressed understanding and agreed to proceed.   Location of Patient: Home  Location of Provider: Clinic   Persons participating in Telemedicine visit: Hermina Baumgardner Farrington-CMA Dr. Nelwyn Salisbury     History of Present Illness: Nancy Terry is a 43 year old female with a history of bipolar disorder, anxiety and depression, previous history of opioid abuse (currently on methadone), migraines, insomnia, GERD, sacroiliac joint dysfunction who presents today for a follow-up visit. She complains of left-sided low back pain which prevents her from sleeping at night and causes her not to think straight.  She had previously received Corticosteroid injection by Dr Doroteo Bradford for sacroiliac joint dysfunction and pain had been on her right side but now her right side is better. 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 As a result she has taken some Xanax pills here and there which have helped her anxiety. She is currently on hydroxyzine and at her last office visit having formed me depression and anxiety had improved.  She denies suicidal ideations or intent.  She has had recurrent abdominal pains and was scheduled to see GI on 08/09/2018 but never made it due to a death in the family.  She had also been referred to infectious disease for follow-up of hepatitis C and her chart reveals attempts at reaching her for an  appointment had failed.  I have provided her with the number to infectious disease to call for an appointment and she will be rescheduling her GI appointment.   Past Medical History:  Diagnosis Date  . Anxiety   . Depression   . Hepatitis C   . Herpes infection   . Hypertension   . Kidney stones   . Migraines   . Narcotic abuse (HCC)    Allergies  Allergen Reactions  . Haldol [Haloperidol Decanoate] Other (See Comments)    Hallucinations  . Nitrates, Organic Other (See Comments)    headaches    Current Outpatient Medications on File Prior to Visit  Medication Sig Dispense Refill  . amitriptyline (ELAVIL) 10 MG tablet Take 1 tablet (10 mg total) by mouth at bedtime. 30 tablet 3  . chlorhexidine (HIBICLENS) 4 % external liquid Apply topically daily as needed. 120 mL 0  . gabapentin (NEURONTIN) 300 MG capsule Take 1 capsule (300 mg total) by mouth 2 (two) times daily. 60 capsule 3  . hydrOXYzine (ATARAX/VISTARIL) 25 MG tablet Take 1 tablet (25 mg total) by mouth 3 (three) times daily as needed. 90 tablet 6  . meloxicam (MOBIC) 7.5 MG tablet Take 1 tablet (7.5 mg total) by mouth daily. 30 tablet 6  . methadone (DOLOPHINE) 10 MG/5ML solution Take 80 mg by mouth every other day.     . promethazine (PHENERGAN) 25 MG tablet Take 1 tablet (25 mg total) by mouth every 8 (eight) hours as needed for nausea or vomiting. 20 tablet 0  . ergocalciferol (DRISDOL) 50000 units capsule Take 1 capsule (50,000 Units  total) by mouth once a week. (Patient not taking: Reported on 07/27/2018) 9 capsule 1  . SUMAtriptan (IMITREX) 100 MG tablet Take 1 tablet (100 mg total) by mouth once for 1 dose. May repeat in 2 hours if headache persists or recurs.Max- 200mg /day 10 tablet 1   No current facility-administered medications on file prior to visit.     Observations/Objective: Awake, alert, oriented x3 Currently in pain Dysphoric mood   Assessment and Plan: 1. Anxiety and  depression Uncontrolled Cymbalta added to regimen She will benefit from therapy - hydrOXYzine (ATARAX/VISTARIL) 25 MG tablet; Take 1 tablet (25 mg total) by mouth 3 (three) times daily as needed.  Dispense: 90 tablet; Refill: 6 - Ambulatory referral to Social Work  2. Sacroiliac dysfunction Uncontrolled Status post previous corticosteroid injection Placed on Cymbalta and tizanidine - Ambulatory referral to Physical Medicine Rehab - gabapentin (NEURONTIN) 300 MG capsule; Take 1 capsule (300 mg total) by mouth 2 (two) times daily.  Dispense: 60 capsule; Refill: 3   Follow Up Instructions: Return for Follow-up, keep previously scheduled appointment.    I discussed the assessment and treatment plan with the patient. The patient was provided an opportunity to ask questions and all were answered. The patient agreed with the plan and demonstrated an understanding of the instructions.   The patient was advised to call back or seek an in-person evaluation if the symptoms worsen or if the condition fails to improve as anticipated.     I provided  minutes total of non-face-to-face time during this encounter including median intraservice time, reviewing previous notes, labs, imaging, medications and explaining diagnosis and management.     Hoy RegisterEnobong Julie Paolini, MD, FAAFP. [facility], [place] [phone]   10/09/2018, 3:44 PM

## 2018-10-09 NOTE — Progress Notes (Signed)
Patient has been called and DOB has been verified. Patient has been screened and transferred to PCP to start phone visit.  Patient is having pain in lower right side of back.

## 2018-10-31 ENCOUNTER — Other Ambulatory Visit: Payer: Self-pay

## 2018-10-31 ENCOUNTER — Ambulatory Visit: Payer: Self-pay | Attending: Family Medicine | Admitting: Family Medicine

## 2018-10-31 ENCOUNTER — Encounter: Payer: Medicaid Other | Admitting: Infectious Diseases

## 2018-11-03 ENCOUNTER — Other Ambulatory Visit: Payer: Self-pay | Admitting: Family Medicine

## 2018-11-03 ENCOUNTER — Encounter: Payer: Self-pay | Attending: Physical Medicine & Rehabilitation | Admitting: Physical Medicine & Rehabilitation

## 2018-11-03 DIAGNOSIS — R112 Nausea with vomiting, unspecified: Secondary | ICD-10-CM

## 2018-11-07 ENCOUNTER — Telehealth: Payer: Self-pay | Admitting: Family Medicine

## 2018-11-07 NOTE — Telephone Encounter (Signed)
New Message  Pt is calling, states she needs to know where to go for the Hep B place and the phone number. Please f/u

## 2018-11-07 NOTE — Telephone Encounter (Signed)
Patient was called and given phone number to [facility] for hep b treatment.

## 2018-11-16 ENCOUNTER — Ambulatory Visit: Payer: Medicaid Other | Admitting: Family Medicine

## 2018-11-17 ENCOUNTER — Telehealth: Payer: Self-pay | Admitting: Family

## 2018-11-17 NOTE — Telephone Encounter (Signed)
COVID-19 Pre-Screening Questions: ° °Do you currently have a fever (>100 °F), chills or unexplained body aches? No  ° °Are you currently experiencing new cough, shortness of breath, sore throat, runny nose? No  °•  °Have you recently travelled outside the state of [place] in the last 14 days? No  °•  °1. Have you been in contact with someone that is currently pending confirmation of Covid19 testing or has been confirmed to have the Covid19 virus?  No  ° °

## 2018-11-19 ENCOUNTER — Other Ambulatory Visit: Payer: Self-pay | Admitting: Family Medicine

## 2018-11-19 DIAGNOSIS — F329 Major depressive disorder, single episode, unspecified: Secondary | ICD-10-CM

## 2018-11-19 DIAGNOSIS — F32A Depression, unspecified: Secondary | ICD-10-CM

## 2018-11-20 ENCOUNTER — Encounter: Payer: Medicaid Other | Admitting: Family

## 2018-11-21 ENCOUNTER — Ambulatory Visit: Payer: Medicaid Other | Admitting: Nurse Practitioner

## 2018-11-29 ENCOUNTER — Other Ambulatory Visit: Payer: Self-pay | Admitting: Family Medicine

## 2018-11-29 DIAGNOSIS — R112 Nausea with vomiting, unspecified: Secondary | ICD-10-CM

## 2018-11-29 MED FILL — GABAPENTIN 300 MG CAPSULE: 300 | 30 days supply | Qty: 60 | Fill #1

## 2018-11-29 MED FILL — hydrOXYzine HCL 25 MG TABS: 25 | 30 days supply | Qty: 90 | Fill #1

## 2018-12-07 ENCOUNTER — Other Ambulatory Visit: Payer: Self-pay

## 2018-12-07 DIAGNOSIS — R112 Nausea with vomiting, unspecified: Secondary | ICD-10-CM

## 2018-12-07 MED ORDER — PROMETHAZINE HCL 25 MG PO TABS: ORAL_TABLET | ORAL | 0 refills | Status: DC

## 2018-12-07 NOTE — Telephone Encounter (Signed)
RX sent on 11/29/18 was not received by the pharmacy, rx was resent today

## 2018-12-10 ENCOUNTER — Other Ambulatory Visit: Payer: Self-pay

## 2018-12-10 ENCOUNTER — Encounter (HOSPITAL_BASED_OUTPATIENT_CLINIC_OR_DEPARTMENT_OTHER): Payer: Self-pay | Admitting: Emergency Medicine

## 2018-12-10 ENCOUNTER — Emergency Department (HOSPITAL_BASED_OUTPATIENT_CLINIC_OR_DEPARTMENT_OTHER)
Admission: EM | Admit: 2018-12-10 | Discharge: 2018-12-10 | Disposition: A | Discharge status: Home / Self Care | Payer: Medicaid Other | Attending: Emergency Medicine | Admitting: Emergency Medicine

## 2018-12-10 DIAGNOSIS — F419 Anxiety disorder, unspecified: Secondary | ICD-10-CM | POA: Insufficient documentation

## 2018-12-10 DIAGNOSIS — Z888 Allergy status to other drugs, medicaments and biological substances status: Secondary | ICD-10-CM | POA: Insufficient documentation

## 2018-12-10 DIAGNOSIS — Z79899 Other long term (current) drug therapy: Secondary | ICD-10-CM | POA: Insufficient documentation

## 2018-12-10 DIAGNOSIS — R109 Unspecified abdominal pain: Secondary | ICD-10-CM

## 2018-12-10 DIAGNOSIS — I10 Essential (primary) hypertension: Secondary | ICD-10-CM | POA: Insufficient documentation

## 2018-12-10 DIAGNOSIS — R4589 Other symptoms and signs involving emotional state: Secondary | ICD-10-CM

## 2018-12-10 DIAGNOSIS — Z720 Tobacco use: Secondary | ICD-10-CM | POA: Insufficient documentation

## 2018-12-10 DIAGNOSIS — G894 Chronic pain syndrome: Secondary | ICD-10-CM | POA: Insufficient documentation

## 2018-12-10 DIAGNOSIS — R1030 Lower abdominal pain, unspecified: Secondary | ICD-10-CM | POA: Insufficient documentation

## 2018-12-10 DIAGNOSIS — Z79891 Long term (current) use of opiate analgesic: Secondary | ICD-10-CM | POA: Insufficient documentation

## 2018-12-10 LAB — COMPREHENSIVE METABOLIC PANEL
ALT: 27 U/L (ref 0–44)
AST: 29 U/L (ref 15–41)
Albumin: 4.5 g/dL (ref 3.5–5.0)
Alkaline Phosphatase: 85 U/L (ref 38–126)
Anion gap: 11 (ref 5–15)
BUN: 11 mg/dL (ref 6–20)
CO2: 24 mmol/L (ref 22–32)
Calcium: 9.4 mg/dL (ref 8.9–10.3)
Chloride: 103 mmol/L (ref 98–111)
Creatinine, Ser: 0.78 mg/dL (ref 0.44–1.00)
GFR calc Af Amer: >60 mL/min (ref >60)
GFR calc non Af Amer: >60 mL/min (ref >60)
Glucose, Bld: 120 mg/dL — ABNORMAL HIGH (ref 70–99)
Potassium: 3.4 mmol/L — ABNORMAL LOW (ref 3.5–5.1)
Sodium: 138 mmol/L (ref 135–145)
Total Bilirubin: 0.7 mg/dL (ref 0.3–1.2)
Total Protein: 8.8 g/dL — ABNORMAL HIGH (ref 6.5–8.1)

## 2018-12-10 LAB — CBC WITH DIFFERENTIAL/PLATELET
Abs Immature Granulocytes: 0.06 10*3/uL (ref 0.00–0.07)
Basophils Absolute: 0 10*3/uL (ref 0.0–0.1)
Basophils Relative: 0 %
Eosinophils Absolute: 0 10*3/uL (ref 0.0–0.5)
Eosinophils Relative: 0 %
HCT: 44.2 % (ref 36.0–46.0)
Hemoglobin: 13.3 g/dL (ref 12.0–15.0)
Immature Granulocytes: 1 %
Lymphocytes Relative: 11 %
Lymphs Abs: 1.1 10*3/uL (ref 0.7–4.0)
MCH: 24.2 pg — ABNORMAL LOW (ref 26.0–34.0)
MCHC: 30.1 g/dL (ref 30.0–36.0)
MCV: 80.5 fL (ref 80.0–100.0)
Monocytes Absolute: 0.4 10*3/uL (ref 0.1–1.0)
Monocytes Relative: 5 %
Neutro Abs: 7.9 10*3/uL — ABNORMAL HIGH (ref 1.7–7.7)
Neutrophils Relative %: 83 %
Platelets: 369 10*3/uL (ref 150–400)
RBC: 5.49 MIL/uL — ABNORMAL HIGH (ref 3.87–5.11)
RDW: 15.5 % (ref 11.5–15.5)
WBC: 9.5 10*3/uL (ref 4.0–10.5)
nRBC: 0 % (ref 0.0–0.2)

## 2018-12-10 LAB — URINALYSIS, ROUTINE W REFLEX MICROSCOPIC
Bilirubin Urine: NEGATIVE
Glucose, UA: NEGATIVE mg/dL
Ketones, ur: 40 mg/dL — AB
Leukocytes,Ua: NEGATIVE
Nitrite: NEGATIVE
Protein, ur: NEGATIVE mg/dL
Specific Gravity, Urine: 1.025 (ref 1.005–1.030)
pH: 6 (ref 5.0–8.0)

## 2018-12-10 LAB — LIPASE, BLOOD: Lipase: 21 U/L (ref 11–51)

## 2018-12-10 LAB — URINALYSIS, MICROSCOPIC (REFLEX)

## 2018-12-10 LAB — RAPID URINE DRUG SCREEN, HOSP PERFORMED
Amphetamines: NOT DETECTED
Barbiturates: NOT DETECTED
Benzodiazepines: POSITIVE — AB
Cocaine: NOT DETECTED
Opiates: POSITIVE — AB
Tetrahydrocannabinol: POSITIVE — AB

## 2018-12-10 LAB — PREGNANCY, URINE: Preg Test, Ur: NEGATIVE

## 2018-12-10 MED ORDER — DIAZEPAM 5 MG PO TABS
5.0000 mg | ORAL_TABLET | Freq: Once | ORAL | Status: AC
  Administered 2018-12-10: 5 mg via ORAL
  Filled 2018-12-10: qty 1

## 2018-12-10 MED ORDER — SODIUM CHLORIDE 0.9 % IV BOLUS
1000.0000 mL | Freq: Once | INTRAVENOUS | Status: AC
  Administered 2018-12-10: 1000 mL via INTRAVENOUS

## 2018-12-10 MED ORDER — KETOROLAC TROMETHAMINE 30 MG/ML IJ SOLN
30.0000 mg | Freq: Once | INTRAMUSCULAR | Status: AC
  Administered 2018-12-10: 12:00:00 30 mg via INTRAVENOUS
  Filled 2018-12-10: qty 1

## 2018-12-10 NOTE — ED Triage Notes (Signed)
Pt reports "major panic attacks" and back pain x 5 days. Pt is tearful and appears anxious.

## 2018-12-12 ENCOUNTER — Telehealth: Payer: Self-pay | Admitting: Family Medicine

## 2018-12-12 NOTE — Telephone Encounter (Signed)
Patient called and states that she is not feeling well. Patient has no appetite and she is sweating very badly. Patient states that she is very weak and she can not keep any food down. Patient would like to know what she can do to get relief until her televisit with angela tomorrow.

## 2018-12-12 NOTE — Telephone Encounter (Signed)
She does have Phenergan on her med list and I will advise she take that for nausea.  If she is unable to wait till her appointment tomorrow she can go to the ED or urgent care.  She could also be having a panic attack as there are ED notes from the RN 2 days ago.

## 2018-12-12 NOTE — Telephone Encounter (Signed)
Pt would like to be called back to discuss some concerns she has about her medications please follow up

## 2018-12-12 NOTE — Telephone Encounter (Signed)
Call was placed to patient and a voicemail was left informing patient to return phone call. 

## 2018-12-13 ENCOUNTER — Other Ambulatory Visit: Payer: Self-pay

## 2018-12-13 ENCOUNTER — Ambulatory Visit: Payer: Self-pay | Attending: Family Medicine | Admitting: Physician Assistant

## 2018-12-13 DIAGNOSIS — E559 Vitamin D deficiency, unspecified: Secondary | ICD-10-CM

## 2018-12-13 DIAGNOSIS — R232 Flushing: Secondary | ICD-10-CM

## 2018-12-13 DIAGNOSIS — Z09 Encounter for follow-up examination after completed treatment for conditions other than malignant neoplasm: Secondary | ICD-10-CM

## 2018-12-13 DIAGNOSIS — N912 Amenorrhea, unspecified: Secondary | ICD-10-CM

## 2018-12-13 DIAGNOSIS — F419 Anxiety disorder, unspecified: Secondary | ICD-10-CM

## 2018-12-13 DIAGNOSIS — F329 Major depressive disorder, single episode, unspecified: Secondary | ICD-10-CM

## 2018-12-13 MED ORDER — DULOXETINE HCL 60 MG PO CPEP: 60.0000 mg | ORAL_CAPSULE | Freq: Every day | ORAL | 3 refills | Status: DC

## 2018-12-13 MED ORDER — HYDROXYZINE HCL 25 MG PO TABS: 25.0000 mg | ORAL_TABLET | Freq: Three times a day (TID) | ORAL | 1 refills | Status: DC | PRN

## 2018-12-13 MED FILL — ?DULoxetine HCL 60 MG CPEP: 60 | 30 days supply | Qty: 30 | Fill #0

## 2018-12-13 MED FILL — hydrOXYzine HCL 25 MG TABS: 25 | 10 days supply | Qty: 30 | Fill #0

## 2018-12-13 NOTE — Progress Notes (Signed)
Patient ID: Nancy Terry, female   DOB: 01/19/1976, 43 y.o.   MRN: [id number] Virtual Visit via Telephone Note  I connected with Nancy Terry on 12/13/18 at  8:30 AM EDT by telephone and verified that I am speaking with the correct person using two identifiers.   I discussed the limitations, risks, security and privacy concerns of performing an evaluation and management service by telephone and the availability of in person appointments. I also discussed with the patient that there may be a patient responsible charge related to this service. The patient expressed understanding and agreed to proceed.  Patient location:  home My Location:  [place] office Persons on the call:  Me and the patient   History of Present Illness: Awakening feeling nauseated and anxious daily for about 4 months.  She was seen in the ED.  Pregnancy test neg.  Drug screen +opiates(on methadone), benzos, THC. LMP 3 months ago.  Also experiencing hot flashes. Never started Cymbalta that she was prescribed at last visit for anxiety.   Denies SI/HI.     Observations/Objective:  A&Ox3.  Sounds anxious   Assessment and Plan: 1. Hot flashes - TSH; Future - FSH/LH; Future  2. Amenorrhea Negative pregnancy 12/10/2018 at ED and has had tubal ligation - TSH; Future - FSH/LH; Future  3. Anxiety and depression Needs to start Cymbalta which she never started - Vitamin D, 25-hydroxy; Future - DULoxetine (CYMBALTA) 60 MG capsule; Take 1 capsule (60 mg total) by mouth daily.  Dispense: 30 capsule; Refill: 3 - hydrOXYzine (ATARAX/VISTARIL) 25 MG tablet; Take 1 tablet (25 mg total) by mouth 3 (three) times daily as needed.  Dispense: 30 tablet; Refill: 1  4. Vitamin D deficiency - Vitamin D, 25-hydroxy; Future   5.  ED f/up   Follow Up Instructions: 6 weeks with PCP.  Lab appt tomorrow   I discussed the assessment and treatment plan with the patient. The patient was provided an opportunity to ask questions and all were  answered. The patient agreed with the plan and demonstrated an understanding of the instructions.   The patient was advised to call back or seek an in-person evaluation if the symptoms worsen or if the condition fails to improve as anticipated.  I provided 14 minutes of non-face-to-face time during this encounter.   Freeman Caldron, PA-C

## 2018-12-13 NOTE — Progress Notes (Signed)
Patient has been called and DOB has been verified. Patient has been screened and transferred to PCP to start phone visit.   Patient has not had a menstrual in 3 months. Patient is having really bad  nausea and shaky in the morning.

## 2018-12-13 NOTE — Telephone Encounter (Signed)
Patient was called for telephone visit and patient was helped by provider [facility].

## 2018-12-14 ENCOUNTER — Other Ambulatory Visit: Payer: Medicaid Other

## 2018-12-15 ENCOUNTER — Telehealth: Payer: Self-pay | Admitting: Licensed Clinical Social Worker

## 2018-12-15 NOTE — Telephone Encounter (Signed)
Call placed to patient. LCSW introduced self and explained role at [facility] and [employer]. Pt was informed of consult from PCP to address behavioral health and/or resource needs.   Pt reports an increase in anxiety and depression symptoms in the last two months. Symptoms include sweating, difficulty focusing, and feelings of sadness and frustration. LCSW educated pt on the correlation between one's physical and mental health. Pt was successful in identifying healthy coping skills. Therapeutic interventions to assist in relaxation and decrease in symptoms were discussed.   Pt reports not having filled all of her prescribed medications. LCSW transferred her to pharmacy to schedule pick up. She has an active blue card to assist with cost. No additional concerns noted.

## 2018-12-19 ENCOUNTER — Ambulatory Visit: Payer: Medicaid Other | Admitting: Family Medicine

## 2018-12-22 NOTE — ED Provider Notes (Signed)
[facility] EMERGENCY DEPARTMENT Provider Note   CSN: [id number] Arrival date & time: 12/10/18  1051     History   Chief Complaint Chief Complaint  Patient presents with  . Back Pain  . Anxiety    HPI Nancy Terry is a 43 y.o. female.     HPI [age] year old female with a history of anxiety reports mild to moderate back pain over the past 5 days and some recent panic attacks.  She is tearful.  She feels nervous.  She states she has no medications at home to control her anxiety.  In regards to her back pain is worse with movement and palpation.  She denies weakness of her arms or legs.  No recent trauma.  She is on methadone and reports that despite this her back pain has seem to have worsened.  She is on methadone for history of recurrent back pain.   Past Medical History:  Diagnosis Date  . Anxiety   . Depression   . Hepatitis C   . Herpes infection   . Hypertension   . Kidney stones   . Migraines   . Narcotic abuse [facility](HCC)     Patient Active Problem List   Diagnosis Date Noted  . Chronic pain syndrome 04/11/2018  . Complex ovarian cyst 11/21/2017  . Hypertension 11/21/2017  . Menorrhagia 05/13/2017  . Sciatica 05/13/2017  . Migraines 05/07/2016  . Hepatitis C 05/07/2016  . Depression 04/05/2016  . Anxiety and depression 04/05/2016  . Narcotic abuse (HCC) 04/05/2016  . Breast lump on right side at 10 o'clock position 03/12/2014  . Pap smear of cervix shows high risk HPV present 03/12/2014    Past Surgical History:  Procedure Laterality Date  . arm surgery    . left elbow surgery     . TUBAL LIGATION       OB History    Gravida  3   Para  3   Term  3   Preterm      AB      Living  3     SAB      TAB      Ectopic      Multiple      Live Births               Home Medications    Prior to Admission medications   Medication Sig Start Date End Date Taking? Authorizing Provider  DULoxetine (CYMBALTA) 60 MG capsule Take 1 capsule  (60 mg total) by mouth daily. 12/13/18   Anders SimmondsMcClung, Angela M, PA-C  hydrOXYzine (ATARAX/VISTARIL) 25 MG tablet Take 1 tablet (25 mg total) by mouth 3 (three) times daily as needed. 12/13/18   Anders SimmondsMcClung, Angela M, PA-C  methadone (DOLOPHINE) 10 MG/5ML solution Take 80 mg by mouth every other day.     [provider]  promethazine (PHENERGAN) 25 MG tablet TAKE 1 TABLET (25 MG TOTAL) BY MOUTH EVERY 8 (EIGHT) HOURS AS NEEDED FOR NAUSEA OR VOMITING. 12/07/18   Hoy RegisterNewlin, Enobong, MD  SUMAtriptan (IMITREX) 100 MG tablet Take 1 tablet (100 mg total) by mouth once for 1 dose. May repeat in 2 hours if headache persists or recurs.Max- 200mg /day 07/27/18 07/27/18  Hoy RegisterNewlin, Enobong, MD    Family History Family History  Problem Relation Age of Onset  . Parkinson's disease Mother   . Hypertension Mother   . Parkinson's disease Maternal Grandmother   . Hypertension Maternal Grandmother   . Cancer Maternal Grandmother  lung  . Glaucoma Maternal Grandmother     Social History Social History   Tobacco Use  . Smoking status: Current Some Day Smoker    Packs/day: 0.50    Years: 0.50    Pack years: 0.25    Types: Cigarettes  . Smokeless tobacco: Never Used  . Tobacco comment: 1/2 pack weekly  Substance Use Topics  . Alcohol use: No  . Drug use: Yes    Types: Heroin, Marijuana    Comment:  smoked marijuana 4 days ago.  hasn't used heroin since 06/01/2014     Allergies   Haldol [haloperidol decanoate] and Nitrates, organic   Review of Systems Review of Systems  All other systems reviewed and are negative.    Physical Exam Updated Vital Signs BP (!) 166/101 (BP Location: Right Arm)   Pulse 79   Temp 98.8 F (37.1 C) (Oral)   Resp 20   Ht 5\' 4"  (1.626 m)   Wt 102 kg   SpO2 99%   BMI 38.60 kg/m   Physical Exam Vitals signs and nursing note reviewed.  Constitutional:      General: She is not in acute distress.    Appearance: She is well-developed.  HENT:     Head: Normocephalic  and atraumatic.  Neck:     Musculoskeletal: Normal range of motion.  Cardiovascular:     Rate and Rhythm: Normal rate and regular rhythm.     Heart sounds: Normal heart sounds.  Pulmonary:     Effort: Pulmonary effort is normal.     Breath sounds: Normal breath sounds.  Abdominal:     General: There is no distension.     Palpations: Abdomen is soft.     Tenderness: There is no abdominal tenderness.  Musculoskeletal: Normal range of motion.     Comments: No thoracic or lumbar point tenderness  Skin:    General: Skin is warm and dry.  Neurological:     Mental Status: She is alert and oriented to person, place, and time.  Psychiatric:     Comments: Anxious appearing      ED Treatments / Results  Labs (all labs ordered are listed, but only abnormal results are displayed) Labs Reviewed  CBC WITH DIFFERENTIAL/PLATELET - Abnormal; Notable for the following components:      Result Value   RBC 5.49 (*)    MCH 24.2 (*)    Neutro Abs 7.9 (*)    All other components within normal limits  COMPREHENSIVE METABOLIC PANEL - Abnormal; Notable for the following components:   Potassium 3.4 (*)    Glucose, Bld 120 (*)    Total Protein 8.8 (*)    All other components within normal limits  URINALYSIS, ROUTINE W REFLEX MICROSCOPIC - Abnormal; Notable for the following components:   Hgb urine dipstick SMALL (*)    Ketones, ur 40 (*)    All other components within normal limits  RAPID URINE DRUG SCREEN, HOSP PERFORMED - Abnormal; Notable for the following components:   Opiates POSITIVE (*)    Benzodiazepines POSITIVE (*)    Tetrahydrocannabinol POSITIVE (*)    All other components within normal limits  URINALYSIS, MICROSCOPIC (REFLEX) - Abnormal; Notable for the following components:   Bacteria, UA FEW (*)    All other components within normal limits  LIPASE, BLOOD  PREGNANCY, URINE    EKG None  Radiology No results found.  Procedures Procedures (including critical care time)   Medications Ordered in ED Medications  diazepam (VALIUM)  tablet 5 mg (5 mg Oral Given 12/10/18 1215)  sodium chloride 0.9 % bolus 1,000 mL (0 mLs Intravenous Stopped 12/10/18 1322)  ketorolac (TORADOL) 30 MG/ML injection 30 mg (30 mg Intravenous Given 12/10/18 1216)     Initial Impression / Assessment and Plan / ED Course  I have reviewed the triage vital signs and the nursing notes.  Pertinent labs & imaging results that were available during my care of the patient were reviewed by me and considered in my medical decision making (see chart for details).        Normal lower extremity neurologic exam. No bowel or bladder complaints. No back pain red flags. Likely musculoskeletal back pain. Doubt spinal epidural abscess. Doubt cauda equina. Doubt abdominal aortic aneurysm   Final Clinical Impressions(s) / ED Diagnoses   Final diagnoses:  Acute flank pain  Tearfulness    ED Discharge Orders    None       Azalia Bilisampos, Kerstie Agent, MD 12/22/18 [phone]

## 2018-12-26 ENCOUNTER — Other Ambulatory Visit: Payer: Self-pay | Admitting: Gastroenterology

## 2018-12-26 DIAGNOSIS — B182 Chronic viral hepatitis C: Secondary | ICD-10-CM

## 2019-01-02 ENCOUNTER — Telehealth: Payer: Self-pay | Admitting: Infectious Diseases

## 2019-01-02 NOTE — Telephone Encounter (Signed)
COVID-19 Pre-Screening Questions:01/02/19  Do you currently have a fever (>100 F), chills or unexplained body aches? NO   Are you currently experiencing new cough, shortness of breath, sore throat, runny nose?NO  Have you recently travelled outside the state of [place] in the last 14 days?NO   Have you been in contact with someone that is currently pending confirmation of Covid19 testing or has been confirmed to have the [place] virus?  NO  **If the patient answers NO to ALL questions -  advise the patient to please call the clinic before coming to the office should any symptoms develop.

## 2019-01-03 ENCOUNTER — Encounter: Payer: Self-pay | Admitting: Infectious Diseases

## 2019-01-03 ENCOUNTER — Ambulatory Visit (INDEPENDENT_AMBULATORY_CARE_PROVIDER_SITE_OTHER): Payer: Self-pay | Admitting: Infectious Diseases

## 2019-01-03 ENCOUNTER — Other Ambulatory Visit: Payer: Self-pay

## 2019-01-03 ENCOUNTER — Telehealth: Payer: Self-pay | Admitting: Pharmacy Technician

## 2019-01-03 VITALS — BP 174/90 | HR 68 | Temp 98.0°F | Ht 64.0 in

## 2019-01-03 DIAGNOSIS — B182 Chronic viral hepatitis C: Secondary | ICD-10-CM

## 2019-01-03 DIAGNOSIS — F1110 Opioid abuse, uncomplicated: Secondary | ICD-10-CM

## 2019-01-03 NOTE — Progress Notes (Signed)
Nancy Terry  [id number]  12-Feb-1976    HPI: The patient is a 43 y.o. y/o white female who presents today for an evaluation for HCV. Earlier this year, she had a positive Ab for HCV. She admits to IVDU with near daily heroin in the past. For the past 5 years, she has maintained sobreity while on methadone maintenance tx via a local methadone clinic. She denies any known HCV+ past sexual contacts. Her only body piercings were here ears, which were pierced in a sterile commercial environment. She denies any tattoos or blood transfusions. She does struggle with chronic back pain, episodic nausea, and recent amenorrhea. She is anxious to begin treatment for her HCV.  Past Medical History:  Diagnosis Date  . Anxiety   . Depression   . Hepatitis C   . Herpes infection   . Hypertension   . Kidney stones   . Migraines   . Narcotic abuse [facility])     Past Surgical History:  Procedure Laterality Date  . arm surgery    . left elbow surgery     . TUBAL LIGATION       Family History  Problem Relation Age of Onset  . Parkinson's disease Mother   . Hypertension Mother   . Parkinson's disease Maternal Grandmother   . Hypertension Maternal Grandmother   . Cancer Maternal Grandmother        lung  . Glaucoma Maternal Grandmother      Social History   Tobacco Use  . Smoking status: Current Some Day Smoker    Packs/day: 0.50    Years: 0.50    Pack years: 0.25    Types: Cigarettes  . Smokeless tobacco: Never Used  . Tobacco comment: 1/2 pack weekly  Substance Use Topics  . Alcohol use: No  . Drug use: Yes    Types: Heroin, Marijuana    Comment:  smoked marijuana 4 days ago.  hasn't used heroin since 06/01/2014      reports previously being sexually active. She reports using the following method of birth control/protection: Surgical.   Allergies  Allergen Reactions  . Haldol [Haloperidol Decanoate] Other (See Comments)    Hallucinations  . Nitrates, Organic Other (See  Comments)    headaches     Outpatient Medications Prior to Visit  Medication Sig Dispense Refill  . methadone (DOLOPHINE) 10 MG/5ML solution Take 80 mg by mouth every other day.     . DULoxetine (CYMBALTA) 60 MG capsule Take 1 capsule (60 mg total) by mouth daily. (Patient not taking: Reported on 01/03/2019) 30 capsule 3  . hydrOXYzine (ATARAX/VISTARIL) 25 MG tablet Take 1 tablet (25 mg total) by mouth 3 (three) times daily as needed. (Patient not taking: Reported on 01/03/2019) 30 tablet 1  . promethazine (PHENERGAN) 25 MG tablet TAKE 1 TABLET (25 MG TOTAL) BY MOUTH EVERY 8 (EIGHT) HOURS AS NEEDED FOR NAUSEA OR VOMITING. (Patient not taking: Reported on 01/03/2019) 20 tablet 0  . SUMAtriptan (IMITREX) 100 MG tablet Take 1 tablet (100 mg total) by mouth once for 1 dose. May repeat in 2 hours if headache persists or recurs.Max- 200mg /day 10 tablet 1   No facility-administered medications prior to visit.      Review of Systems  Constitutional: Positive for fatigue. Negative for chills and fever.  HENT: Negative for congestion, hearing loss and sinus pressure.   Eyes: Negative for photophobia, discharge, redness and visual disturbance.  Respiratory: Negative for apnea, cough, shortness of breath and wheezing.  Cardiovascular: Negative for chest pain and leg swelling.  Gastrointestinal: Positive for nausea. Negative for abdominal distention, abdominal pain, constipation, diarrhea and vomiting.  Endocrine: Negative for cold intolerance, heat intolerance, polydipsia and polyuria.  Genitourinary: Positive for menstrual problem. Negative for dysuria, flank pain, frequency, urgency, vaginal bleeding and vaginal discharge.  Musculoskeletal: Positive for back pain. Negative for arthralgias, joint swelling and neck pain.  Skin: Negative for pallor and rash.  Allergic/Immunologic: Negative for immunocompromised state.  Neurological: Negative for dizziness, seizures, speech difficulty, weakness and  headaches.  Hematological: Does not bruise/bleed easily.  Psychiatric/Behavioral: Negative for agitation, confusion, hallucinations and sleep disturbance. The patient is not nervous/anxious.      Vitals:   01/03/19 0959  BP: (!) 174/90  Pulse: 68  Temp: 98 F (36.7 C)     Physical Exam Gen: pleasant, appears older than stated age, NAD, A&Ox 3 Head: NCAT, no temporal wasting evident EENT: PERRL, EOMI, MMM, adequate dentition Neck: supple, no JVD CV: NRRR, no murmurs evident Pulm: CTA bilaterally, mild wheeze, no retractions Abd: soft, obese, NTND, +BS Extrems: trace LE edema, 2+ pulses Skin: no rashes, adequate skin turgor Neuro: CN II-XII grossly intact, no focal neurologic deficits appreciated, gait was normal, A&Ox 3   Labs: No results found for: HEPBSAG  No results found for: HCVRNAPCRQN  No results found for: FIBROSTAGE  No results found for: HCVGENOTYPE  Lab Results  Component Value Date   WBC 9.5 12/10/2018   HGB 13.3 12/10/2018   HCT 44.2 12/10/2018   MCV 80.5 12/10/2018   PLT 369 12/10/2018       Chemistry      Component Value Date/Time   NA 138 12/10/2018 1218   NA 139 03/22/2017 0947   K 3.4 (L) 12/10/2018 1218   CL 103 12/10/2018 1218   CO2 24 12/10/2018 1218   BUN 11 12/10/2018 1218   BUN 10 03/22/2017 0947   CREATININE 0.78 12/10/2018 1218      Component Value Date/Time   CALCIUM 9.4 12/10/2018 1218   ALKPHOS 85 12/10/2018 1218   AST 29 12/10/2018 1218   ALT 27 12/10/2018 1218   BILITOT 0.7 12/10/2018 1218   BILITOT 0.4 03/22/2017 0947        Assessment/Plan: The patient is a 43 y/o WF with h/o IVDU now on chronic methadone maintenance tx presenting for chronic HCV infection.  HCV - HCV Ab was positive in 07/2018, thus confirming past exposure to pathogen. Note: this screening test will remain positive/reactive life-long even if HCV infection has been cured/immunologically cleared. Most likely risk factor for acquisition was past IV  drug use from which she has now abstained further use for the past 5 years while on methadone maintenance tx. Check an HCV VL and genotype to assess for chronic infection. Will check an HIV Ab to exclude co-infection. Check Hep B surface Ag to assess for active concurrent hep B. Check a FibroSURE to stage fibrosis of her liver to assist in determining best DAA choice and duration of tx. Will f/u with the patient in 6 weeks to review labs.  Health maintenance -  I have counselled the patient extensively re: the need for barrier precautions with sexual activity in order to prevent sexual transmission of HCV. She must continue these practices until 3 months after treatment completion until a sustained virologic response (SVR) has been confirmed to establish a cure of her infection. She currently reports no sexual activity for the past several years. She expressed full understanding of  these instructions. Vaccination for hepatitis A & B was recommended as was abstinence from cigarettes.

## 2019-01-03 NOTE — Telephone Encounter (Signed)
[facility] Patient Advocate Encounter    Findings of the benefits investigation conducted this morning via test claims for the patient's upcoming appointment on 01/03/19 are as follows:   Insurance: uninsured, uses Psych[facility], forensic and [facility] for pharmacy.  Will have patient sign uninsured medication assistance applications when she arrives for her appointment.

## 2019-01-03 NOTE — Patient Instructions (Signed)
Return to clinic in 6 weeks to discuss starting HCV treatment. Continue to abstain from heroin and other illicit drugs

## 2019-01-13 LAB — LIVER FIBROSIS, FIBROTEST-ACTITEST
ALT: 41 U/L — ABNORMAL HIGH (ref 6–29)
Alpha-2-Macroglobulin: 125 mg/dL (ref 106–279)
Apolipoprotein A1: 111 mg/dL (ref 101–198)
Bilirubin: 0.4 mg/dL (ref 0.2–1.2)
Fibrosis Score: 0.04
GGT: 11 U/L (ref 3–55)
Haptoglobin: 249 mg/dL — ABNORMAL HIGH (ref 43–212)
Necroinflammat ACT Score: 0.16
Reference ID: 3026968

## 2019-01-13 LAB — HEPATITIS C GENOTYPE

## 2019-01-13 LAB — HEPATITIS C RNA QUANTITATIVE
HCV Quantitative Log: 5.55 Log IU/mL — ABNORMAL HIGH
HCV RNA, PCR, QN: 356000 IU/mL — ABNORMAL HIGH

## 2019-01-13 LAB — HEPATITIS B SURFACE ANTIGEN: Hepatitis B Surface Ag: NONREACTIVE

## 2019-01-13 LAB — HIV ANTIBODY (ROUTINE TESTING W REFLEX): HIV 1&2 Ab, 4th Generation: NONREACTIVE

## 2019-01-16 ENCOUNTER — Other Ambulatory Visit: Payer: Medicaid Other

## 2019-01-30 ENCOUNTER — Ambulatory Visit: Payer: Medicaid Other | Admitting: Family Medicine

## 2019-02-09 ENCOUNTER — Other Ambulatory Visit: Payer: Self-pay

## 2019-02-09 ENCOUNTER — Ambulatory Visit: Payer: Self-pay | Attending: Family Medicine

## 2019-02-22 ENCOUNTER — Ambulatory Visit: Payer: Medicaid Other | Admitting: Infectious Diseases

## 2019-02-27 ENCOUNTER — Ambulatory Visit: Payer: Self-pay | Attending: Family Medicine | Admitting: Family Medicine

## 2019-02-27 ENCOUNTER — Other Ambulatory Visit: Payer: Self-pay

## 2019-02-27 DIAGNOSIS — G43809 Other migraine, not intractable, without status migrainosus: Secondary | ICD-10-CM

## 2019-02-27 DIAGNOSIS — R112 Nausea with vomiting, unspecified: Secondary | ICD-10-CM

## 2019-02-27 DIAGNOSIS — M533 Sacrococcygeal disorders, not elsewhere classified: Secondary | ICD-10-CM

## 2019-02-27 MED ORDER — SUMATRIPTAN SUCCINATE 100 MG PO TABS: 100.0000 mg | ORAL_TABLET | Freq: Once | ORAL | 1 refills | Status: DC

## 2019-02-27 MED ORDER — PROMETHAZINE HCL 25 MG PO TABS: ORAL_TABLET | ORAL | 1 refills | Status: DC

## 2019-02-27 NOTE — Progress Notes (Signed)
Virtual Visit via Telephone Note  I connected with Nancy Terry, on 02/27/2019 at 4:30 PM by telephone due to the COVID-19 pandemic and verified that I am speaking with the correct person using two identifiers.   Consent: I discussed the limitations, risks, security and privacy concerns of performing an evaluation and management service by telephone and the availability of in person appointments. I also discussed with the patient that there may be a patient responsible charge related to this service. The patient expressed understanding and agreed to proceed.   Location of Patient: Home  Location of Provider: Clinic   Persons participating in Telemedicine visit: Liam Bossman Farrington-CMA Dr. Felecia Shelling     History of Present Illness: Nancy Terry is a 43 year old female with a history of bipolar disorder, anxiety and depression, previous history of opioid abuse (currently on methadone), migraines, insomnia, GERD, sacroiliac joint dysfunction who presents today for a follow-up visit.  Complains of headaches and should be on Imitrex which she has not been compliant with. Used it a while ago, several years ago with improvement in her headaches however it was written at her last visit and she never picked it up. She has sacroiliac joint dysfunction and quit following up with Rehab for injections as she states quitting those injections and medications made her feel better. Currently does not want any medications for her back. "I don't want to be on a bunch of medications" Pain is worse with prolonged standing and right now is a 2/10. Had a cycle in 12/2018 and it was heavy but with no passage of clots and after that has not had her cycle since then; wondering if she is going through the change. For anxiety she was on Cymbalta and has not been taking it and has no interest in taking it. She would like a refill of Phenergan which she uses intermittently for nausea.  Past Medical History:   Diagnosis Date  . Anxiety   . Depression   . Hepatitis C   . Herpes infection   . Hypertension   . Kidney stones   . Migraines   . Narcotic abuse (HCC)    Allergies  Allergen Reactions  . Haldol [Haloperidol Decanoate] Other (See Comments)    Hallucinations  . Nitrates, Organic Other (See Comments)    headaches    Current Outpatient Medications on File Prior to Visit  Medication Sig Dispense Refill  . promethazine (PHENERGAN) 25 MG tablet TAKE 1 TABLET (25 MG TOTAL) BY MOUTH EVERY 8 (EIGHT) HOURS AS NEEDED FOR NAUSEA OR VOMITING. 20 tablet 0  . DULoxetine (CYMBALTA) 60 MG capsule Take 1 capsule (60 mg total) by mouth daily. (Patient not taking: Reported on 01/03/2019) 30 capsule 3  . hydrOXYzine (ATARAX/VISTARIL) 25 MG tablet Take 1 tablet (25 mg total) by mouth 3 (three) times daily as needed. (Patient not taking: Reported on 01/03/2019) 30 tablet 1  . methadone (DOLOPHINE) 10 MG/5ML solution Take 80 mg by mouth every other day.     . SUMAtriptan (IMITREX) 100 MG tablet Take 1 tablet (100 mg total) by mouth once for 1 dose. May repeat in 2 hours if headache persists or recurs.Max- 200mg /day 10 tablet 1   No current facility-administered medications on file prior to visit.     Observations/Objective: Awake, alert, oriented x3 Not in acute distress  Assessment and Plan: 1. Non-intractable vomiting with nausea, unspecified vomiting type Associated with her migraines - promethazine (PHENERGAN) 25 MG tablet; TAKE 1 TABLET (25 MG TOTAL) BY MOUTH  EVERY 8 (EIGHT) HOURS AS NEEDED FOR NAUSEA OR VOMITING.  Dispense: 30 tablet; Refill: 1  2. Other migraine without status migrainosus, not intractable Uncontrolled due to noncompliance Compliance has been emphasized and she would like to resume Imitrex again - SUMAtriptan (IMITREX) 100 MG tablet; Take 1 tablet (100 mg total) by mouth once for 1 dose. May repeat in 2 hours if headache persists or recurs.Max- 200mg /day  Dispense: 10 tablet;  Refill: 1  3. Sacroiliac dysfunction Uncontrolled Declines analgesics or muscle relaxant Previously received epidural spinal injections from rehab which she has decided to discontinue   Follow Up Instructions: Return in about 3 months (around 05/29/2019) for Medical conditions.    I discussed the assessment and treatment plan with the patient. The patient was provided an opportunity to ask questions and all were answered. The patient agreed with the plan and demonstrated an understanding of the instructions.   The patient was advised to call back or seek an in-person evaluation if the symptoms worsen or if the condition fails to improve as anticipated.     I provided 15 minutes total of non-face-to-face time during this encounter including median intraservice time, reviewing previous notes, labs, imaging, medications, management and patient verbalized understanding.     05/31/2019, MD, FAAFP. [facility] [place], [place]   02/27/2019, 4:30 PM

## 2019-02-27 NOTE — Progress Notes (Signed)
Patient has been called and DOB has been verified. Patient has been screened and transferred to PCP to start phone visit.   Patient has been having headaches and back pain.

## 2019-02-28 ENCOUNTER — Ambulatory Visit (INDEPENDENT_AMBULATORY_CARE_PROVIDER_SITE_OTHER): Payer: Self-pay | Admitting: Infectious Diseases

## 2019-02-28 ENCOUNTER — Encounter: Payer: Self-pay | Admitting: Infectious Diseases

## 2019-02-28 ENCOUNTER — Other Ambulatory Visit: Payer: Self-pay

## 2019-02-28 VITALS — BP 148/81 | HR 58 | Temp 97.9°F

## 2019-02-28 DIAGNOSIS — B182 Chronic viral hepatitis C: Secondary | ICD-10-CM

## 2019-02-28 DIAGNOSIS — F1110 Opioid abuse, uncomplicated: Secondary | ICD-10-CM

## 2019-02-28 MED ORDER — LEDIPASVIR-SOFOSBUVIR 90-400 MG PO TABS: 1.0000 | ORAL_TABLET | Freq: Every day | ORAL | 1 refills | Status: DC

## 2019-02-28 MED FILL — ?PROMETHAZINE HCL 25MG TABS: AMNEAL | 10 days supply | Qty: 30 | Fill #0

## 2019-02-28 MED FILL — SUMAtriptan SUCCINATE 100 M: 100 | 30 days supply | Qty: 10 | Fill #0

## 2019-02-28 NOTE — Patient Instructions (Signed)
Return to clinic in 6 weeks for appointment with Dr. Prince Rome. Take harvoni 1 tab daily for 8 weeks. Will need repeat labs drawn 4 weeks after you start harvoni.

## 2019-02-28 NOTE — Telephone Encounter (Signed)
[facility] Patient Advocate Encounter  An application for [employer] was filled out, signed and faxed today for coverage of Harvoni. The patient is aware of the process and we will reach out to the patient when she's approved to be on the look out for a call from [facility]

## 2019-02-28 NOTE — Progress Notes (Signed)
Nancy Terry  [id number]  May 01, 1976    HPI: The patient is a 43 y.o. y/o white female who presents today for a routine return visit for HCV. She was last seen in our clinic on 01/03/2019 at which time she was esablishing care with our clinic. Recent testing showed chronic HCV infection with genotype 1a and HCV viremia of 356 K IU. Her HIV Ab was negative and FibroSURE showed F0 fibrosis. 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 She is anxious to start DAA tx for HCV. Her only compliant today is perimenopausal sxs such as hot flashes and irregular menses (currently every 2-3 months).  Past Medical History:  Diagnosis Date  . Anxiety   . Depression   . Hepatitis C   . Herpes infection   . Hypertension   . Kidney stones   . Migraines   . Narcotic abuse [facility])     Past Surgical History:  Procedure Laterality Date  . arm surgery    . left elbow surgery     . TUBAL LIGATION       Family History  Problem Relation Age of Onset  . Parkinson's disease Mother   . Hypertension Mother   . Parkinson's disease Maternal Grandmother   . Hypertension Maternal Grandmother   . Cancer Maternal Grandmother        lung  . Glaucoma Maternal Grandmother      Social History   Tobacco Use  . Smoking status: Current Some Day Smoker    Packs/day: 0.50    Years: 0.50    Pack years: 0.25    Types: Cigarettes  . Smokeless tobacco: Never Used  . Tobacco comment: 1/2 pack weekly  Substance Use Topics  . Alcohol use: No  . Drug use: Yes    Types: Heroin, Marijuana    Comment:  smoked marijuana 4 days ago.  hasn't used heroin since 06/01/2014      reports previously being sexually active. She reports using the following method of birth control/protection: Surgical.  No sexual activity x 5 years.  Allergies  Allergen Reactions  . Haldol [Haloperidol Decanoate] Other (See Comments)    Hallucinations  . Nitrates, Organic Other (See  Comments)    headaches     Outpatient Medications Prior to Visit  Medication Sig Dispense Refill  . DULoxetine (CYMBALTA) 60 MG capsule Take 1 capsule (60 mg total) by mouth daily. (Patient not taking: Reported on 01/03/2019) 30 capsule 3  . hydrOXYzine (ATARAX/VISTARIL) 25 MG tablet Take 1 tablet (25 mg total) by mouth 3 (three) times daily as needed. (Patient not taking: Reported on 01/03/2019) 30 tablet 1  . methadone (DOLOPHINE) 10 MG/5ML solution Take 80 mg by mouth every other day.     . promethazine (PHENERGAN) 25 MG tablet TAKE 1 TABLET (25 MG TOTAL) BY MOUTH EVERY 8 (EIGHT) HOURS AS NEEDED FOR NAUSEA OR VOMITING. 30 tablet 1  . SUMAtriptan (IMITREX) 100 MG tablet Take 1 tablet (100 mg total) by mouth once for 1 dose. May repeat in 2 hours if headache persists or recurs.Max- 262m/day 10 tablet 1   No facility-administered medications prior to visit.      Review of Systems  Constitutional: Negative for chills, fatigue and fever.  HENT: Negative for congestion, hearing loss and sinus pressure.   Eyes: Negative for photophobia, discharge, redness and visual disturbance.  Respiratory: Negative for apnea, cough, shortness of breath and wheezing.   Cardiovascular: Negative for  chest pain and leg swelling.  Gastrointestinal: Negative for abdominal distention, abdominal pain, constipation, diarrhea, nausea and vomiting.  Endocrine: Positive for heat intolerance. Negative for cold intolerance, polydipsia and polyuria.  Genitourinary: Negative for dysuria, flank pain, frequency, urgency, vaginal bleeding and vaginal discharge.  Musculoskeletal: Negative for arthralgias, back pain, joint swelling and neck pain.  Skin: Negative for pallor and rash.  Allergic/Immunologic: Negative for immunocompromised state.  Neurological: Negative for dizziness, seizures, speech difficulty, weakness and headaches.  Hematological: Does not bruise/bleed easily.  Psychiatric/Behavioral: Negative for agitation,  confusion, hallucinations and sleep disturbance. The patient is not nervous/anxious.      There were no vitals filed for this visit.   Physical Exam Gen: pleasant, appears older than stated age, NAD, A&Ox 3 Head: NCAT, no temporal wasting evident EENT: PERRL, EOMI, MMM, adequate dentition Neck: supple, no JVD CV: NRRR, no murmurs evident Pulm: CTA bilaterally, mild expiratory wheeze, no retractions Abd: soft, obese, NTND, +BS Extrems: trace LE edema, 2+ pulses Skin: no rashes, adequate skin turgor Neuro: CN II-XII grossly intact, no focal neurologic deficits appreciated, gait was normal, A&Ox 3   Labs: Lab Results  Component Value Date   HEPBSAG NON-REACTIVE 01/03/2019    Lab Results  Component Value Date   HCVRNAPCRQN 356,000 (H) 01/03/2019    Lab Results  Component Value Date   Bennie Pierini 01/03/2019    Lab Results  Component Value Date   HCVGENOTYPE 1a 01/03/2019    Lab Results  Component Value Date   WBC 9.5 12/10/2018   HGB 13.3 12/10/2018   HCT 44.2 12/10/2018   MCV 80.5 12/10/2018   PLT 369 12/10/2018       Chemistry      Component Value Date/Time   NA 138 12/10/2018 1218   NA 139 03/22/2017 0947   K 3.4 (L) 12/10/2018 1218   CL 103 12/10/2018 1218   CO2 24 12/10/2018 1218   BUN 11 12/10/2018 1218   BUN 10 03/22/2017 0947   CREATININE 0.78 12/10/2018 1218      Component Value Date/Time   CALCIUM 9.4 12/10/2018 1218   ALKPHOS 85 12/10/2018 1218   AST 29 12/10/2018 1218   ALT 41 (H) 01/03/2019 1132   BILITOT 0.7 12/10/2018 1218   BILITOT 0.4 03/22/2017 0947        Assessment/Plan: The patient is a 43 y/o WF with h/o IVDU now on chronic methadone maintenance tx presenting for chronic HCV infection.  HCV - HCV Ab was positive in 07/2018, thus confirming past exposure to pathogen. Note: this screening test will remain positive/reactive life-long even if HCV infection has been cured/immunologically cleared. Most likely risk factor for  acquisition was past IV drug use from which she has now abstained further use for the past 5 years while on methadone maintenance tx. Chronic HCV infection has been confirmed with an HCV viral load of 356 K with genotype 1a. Her HIV Ab was negative, thus excluding co-infection. FibroSURE testing showed F0 stage of fibrosis. DAA options were reviewed in detail with the patient, and she elected to begin tx with harvoni 1 tab daily for an anticipated 8 week duration. Pharmacy staff met with the patient to assist in completion of DAP forms for compassionate use. F/u in 6 weeks anticipating review of week 4 tx results.   Health maintenance -  I have counselled the patient extensively re: the need for barrier precautions with sexual activity in order to prevent sexual transmission of HCV. She must continue these practices  until 3 months after treatment completion until a sustained virologic response (SVR) has been confirmed to establish a cure of her infection. She currently reports no sexual activity for the past 4-5 years. She expressed full understanding of these instructions. Vaccination for hepatitis A & B was recommended as was abstinence from cigarettes.

## 2019-03-07 ENCOUNTER — Telehealth: Payer: Self-pay | Admitting: Pharmacist

## 2019-03-07 NOTE — Telephone Encounter (Signed)
Called patient to counsel on Harvoni as she is supposed to start it tomorrow. No answer, left HIPAA compliant VM.

## 2019-03-12 NOTE — Telephone Encounter (Addendum)
[facility] Patient Advocate Encounter  Patient has been approved into [employer] program from 03/01/2019 until 04/26/2019. I received a call from [place] on 10/01 and they were going to reach out to the patient to ship the medication that day. Left the patient a voicemail to see if she received the medication and set up her 4 week follow up appointment.   Patient called back, she received her medicine on 03/10/2019 but may have to go out of town to [place] for a couple weeks. If that is the case, she wants to wait until she gets back in town to start. She will know by the end of the week (10/9) and will let us know if her follow-up appointment needs to be adjusted. I will create a flowsheet once she confirms actual start date.

## 2019-03-27 NOTE — Telephone Encounter (Addendum)
Patient called and stated that she won't start her medication until 3 weeks from 03/26/2019. She said her start date would be relative to her return from [place]. I told her we would schedule her appointment 4 weeks from start date and she indicated she still wanted to keep her 11/18 appointment. Will cancel her follow-up appointments and check back in 04/16/2019 to see if she's back in town. Updated [facility] on dates for second shipment of medication.

## 2019-04-02 ENCOUNTER — Encounter: Payer: Medicaid Other | Admitting: Family Medicine

## 2019-04-11 ENCOUNTER — Other Ambulatory Visit: Payer: Medicaid Other

## 2019-04-11 ENCOUNTER — Ambulatory Visit: Payer: Medicaid Other | Admitting: Pharmacist

## 2019-04-20 NOTE — Telephone Encounter (Signed)
[facility] Patient Advocate Encounter  Tried to call the patient and confirm whether she started the medication yet but had to leave a voicemail. I also wanted to see if the patient setup her other shipment with [employer] because at this point she is getting closer to 11/19 enrollment expiration. Called both numbers she provided to me.

## 2019-04-25 ENCOUNTER — Encounter: Payer: Medicaid Other | Admitting: Infectious Diseases

## 2019-04-30 NOTE — Telephone Encounter (Addendum)
[facility] Patient Advocate Encounter  Spoke with the patient today to see if she started the medication and she stated that she will not take until June 08, 2019. She wants to wait until her mother is done with chemotherapy and her stepfather placed in a nursing facility. She only received one bottle of Harvoni so [employer] gave her another 4 weeks coverage to get her second bottle of medication. Will make a note to follow up in January and see if the patient started her medication and schedule a follow up appointment.  [facility] called 12/04 to say they were unable to reach the patient to set up the last shipment. I provided them all the numbers we had on file. They will try [phone] and if unsuccessful will call us back.

## 2019-06-12 NOTE — Telephone Encounter (Signed)
[facility] Patient Advocate Encounter  [facility] called today to let us know they were able to reach the patient and shipped out her last refill of Harvoni on 06/05/2019. I tried to call the patient but the number we had on file was disconnected and the other number went to a horse farm, did not leave a voicemail. Will attempt to reach the patient tomorrow to find out her start date and schedule a follow-up appointment.

## 2019-06-14 ENCOUNTER — Encounter: Payer: Self-pay | Admitting: Pharmacy Technician

## 2019-07-03 ENCOUNTER — Telehealth: Payer: Self-pay | Admitting: Pharmacist

## 2019-07-04 ENCOUNTER — Ambulatory Visit: Payer: Self-pay | Attending: Family Medicine | Admitting: Family Medicine

## 2019-07-04 ENCOUNTER — Other Ambulatory Visit: Payer: Self-pay

## 2019-07-04 ENCOUNTER — Encounter: Payer: Self-pay | Admitting: Family Medicine

## 2019-07-04 DIAGNOSIS — L0292 Furuncle, unspecified: Secondary | ICD-10-CM

## 2019-07-04 DIAGNOSIS — Z1231 Encounter for screening mammogram for malignant neoplasm of breast: Secondary | ICD-10-CM

## 2019-07-04 DIAGNOSIS — R112 Nausea with vomiting, unspecified: Secondary | ICD-10-CM

## 2019-07-04 DIAGNOSIS — L02423 Furuncle of right upper limb: Secondary | ICD-10-CM

## 2019-07-04 MED ORDER — PROMETHAZINE HCL 25 MG PO TABS: ORAL_TABLET | ORAL | 1 refills | Status: DC

## 2019-07-04 MED ORDER — PANTOPRAZOLE SODIUM 40 MG PO TBEC: 40.0000 mg | DELAYED_RELEASE_TABLET | Freq: Every day | ORAL | 1 refills | Status: DC

## 2019-07-04 MED ORDER — CEPHALEXIN 500 MG PO CAPS: 500.0000 mg | ORAL_CAPSULE | Freq: Two times a day (BID) | ORAL | 0 refills | Status: DC

## 2019-07-04 MED FILL — ?PROMETHAZINE HCL 25MG TABS: AMNEAL | 10 days supply | Qty: 30 | Fill #0

## 2019-07-04 MED FILL — CEPHALEXIN 500 MG CAPSULE: 500 | 7 days supply | Qty: 14 | Fill #0

## 2019-07-04 MED FILL — PANTOPRAZOLE SOD DR 40 MG T: 40 | 30 days supply | Qty: 30 | Fill #0

## 2019-07-04 NOTE — Progress Notes (Signed)
Patient has been called and DOB has been verified. Patient has been screened and transferred to PCP to start phone visit.  Would like refill for phenergan Referral for mammogram.

## 2019-07-04 NOTE — Progress Notes (Signed)
Virtual Visit via Telephone Note  I connected with Nancy Terry, on 07/04/2019 at 10:33 AM by telephone due to the COVID-19 pandemic and verified that I am speaking with the correct person using two identifiers.   Consent: I discussed the limitations, risks, security and privacy concerns of performing an evaluation and management service by telephone and the availability of in person appointments. I also discussed with the patient that there may be a patient responsible charge related to this service. The patient expressed understanding and agreed to proceed.   Location of Patient: Home  Location of Provider: Clinic   Persons participating in Telemedicine visit: Lameka Disla Farrington-CMA Dr. Margarita Rana     History of Present Illness: Nancy Terry is a 44 year old female with a history of bipolar disorder, anxiety and depression, previous history of opioid abuse (currently on methadone), migraines, insomnia, GERD, sacroiliac joint dysfunction here with the following concerns.   In her R arm she has developed some lesions which drain yellow pus and leaves a big hole when drainage is completed. She has pain, itching burning and lesions never seem to heal up and have been present for 1 year. She denies the presence of fever. She thinks it could be herpes.  Requests Phenergan as she has nausea every morning; H.pylori from 11/2017 was negative. She does have intermittent dyspepsia She is seeing infectious disease and will be commencing Harvoni for her Hep C. Past Medical History:  Diagnosis Date  . Anxiety   . Depression   . Hepatitis C   . Herpes infection   . Hypertension   . Kidney stones   . Migraines   . Narcotic abuse (HCC)    Allergies  Allergen Reactions  . Haldol [Haloperidol Decanoate] Other (See Comments)    Hallucinations  . Nitrates, Organic Other (See Comments)    headaches    Current Outpatient Medications on File Prior to Visit  Medication Sig Dispense  Refill  . ibuprofen (ADVIL) 200 MG tablet Take 200 mg by mouth every 6 (six) hours as needed.    . Ledipasvir-Sofosbuvir (HARVONI) 90-400 MG TABS Take 1 tablet by mouth daily. 28 tablet 1  . methadone (DOLOPHINE) 10 MG/5ML solution Take 120 mg by mouth every other day.     . promethazine (PHENERGAN) 25 MG tablet TAKE 1 TABLET (25 MG TOTAL) BY MOUTH EVERY 8 (EIGHT) HOURS AS NEEDED FOR NAUSEA OR VOMITING. 30 tablet 1  . DULoxetine (CYMBALTA) 60 MG capsule Take 1 capsule (60 mg total) by mouth daily. (Patient not taking: Reported on 01/03/2019) 30 capsule 3  . hydrOXYzine (ATARAX/VISTARIL) 25 MG tablet Take 1 tablet (25 mg total) by mouth 3 (three) times daily as needed. (Patient not taking: Reported on 01/03/2019) 30 tablet 1  . SUMAtriptan (IMITREX) 100 MG tablet Take 1 tablet (100 mg total) by mouth once for 1 dose. May repeat in 2 hours if headache persists or recurs.Max- 200mg /day 10 tablet 1   No current facility-administered medications on file prior to visit.    Observations/Objective: Awake, alert, oriented x3 Not in acute distess  Assessment and Plan: 1. Non-intractable vomiting with nausea, unspecified vomiting type H.pylori negative Ill commence PP If unrelenting will refer to GI - promethazine (PHENERGAN) 25 MG tablet; TAKE 1 TABLET (25 MG TOTAL) BY MOUTH EVERY 8 (EIGHT) HOURS AS NEEDED FOR NAUSEA OR VOMITING.  Dispense: 30 tablet; Refill: 1 - pantoprazole (PROTONIX) 40 MG tablet; Take 1 tablet (40 mg total) by mouth daily.  Dispense: 30 tablet; Refill: 1  2. Furunculosis Unlikely to be Shingles given duration and history Will need in person visit if persisting - cephALEXin (KEFLEX) 500 MG capsule; Take 1 capsule (500 mg total) by mouth 2 (two) times daily.  Dispense: 14 capsule; Refill: 0  3. Encounter for screening mammogram for malignant neoplasm of breast - MM 3D SCREEN BREAST BILATERAL; Future   Follow Up Instructions: Return in about 1 month (around 08/04/2019) for  Complete physical exam.    I discussed the assessment and treatment plan with the patient. The patient was provided an opportunity to ask questions and all were answered. The patient agreed with the plan and demonstrated an understanding of the instructions.   The patient was advised to call back or seek an in-person evaluation if the symptoms worsen or if the condition fails to improve as anticipated.     I provided 15 minutes total of non-face-to-face time during this encounter including median intraservice time, reviewing previous notes, investigations, ordering medications, medical decision making, coordinating care and patient verbalized understanding at the end of the visit.     Hoy Register, MD, FAAFP. [facility] [place], [place] [phone]   07/04/2019, 10:33 AM

## 2019-07-04 NOTE — Telephone Encounter (Signed)
Good assessment Tyler. Appreciate the help. We will see her next week!

## 2019-07-06 ENCOUNTER — Other Ambulatory Visit: Payer: Medicaid Other

## 2019-07-10 ENCOUNTER — Other Ambulatory Visit (HOSPITAL_COMMUNITY): Payer: Self-pay | Admitting: *Deleted

## 2019-07-10 DIAGNOSIS — Z1231 Encounter for screening mammogram for malignant neoplasm of breast: Secondary | ICD-10-CM

## 2019-07-10 NOTE — Progress Notes (Unsigned)
Hx of chronic hep C w/ genotype 1a and HCV viremia of 356 K IU. Dx 07/2018. F0 fibrosis. HIV neg  Abstained from IVDU the past 5 years. Uses methadone.  Recomened to get hep A and B and smoking cessation  Was supposed to start Harvoni 1 tab daily for 8 weeks in Sept 2020 w/ lab draw 4 weeks after that.  On 1/27 reports n/v and has not started Harvoni. Wants to get it resolved first.  Plan: Ask if n/v has resolved. Given promethazine and pantoprazole at doc appt on 07/03/18.  Start Harvoni and do labs in 4 weeks.

## 2019-07-11 ENCOUNTER — Ambulatory Visit: Payer: Medicaid Other | Admitting: Pharmacist

## 2019-07-18 NOTE — Progress Notes (Signed)
HPI: Nancy Terry is a 45 y.o. female who presents to the [facility] pharmacy clinic for Hepatitis C follow-up.  Medication: Harvoni x 8 weeks  Start Date: Not started yet  Hepatitis C Genotype: 1a  Fibrosis Score: F0  Hepatitis C RNA: 356 K IU  Patient Active Problem List   Diagnosis Date Noted  . Chronic pain syndrome 04/11/2018  . Complex ovarian cyst 11/21/2017  . Hypertension 11/21/2017  . Menorrhagia 05/13/2017  . Sciatica 05/13/2017  . Migraines 05/07/2016  . Hepatitis C 05/07/2016  . Depression 04/05/2016  . Anxiety and depression 04/05/2016  . Narcotic abuse (HCC) 04/05/2016  . Breast lump on right side at 10 o'clock position 03/12/2014  . Pap smear of cervix shows high risk HPV present 03/12/2014    Patient's Medications  New Prescriptions   No medications on file  Previous Medications   CEPHALEXIN (KEFLEX) 500 MG CAPSULE    Take 1 capsule (500 mg total) by mouth 2 (two) times daily.   DULOXETINE (CYMBALTA) 60 MG CAPSULE    Take 1 capsule (60 mg total) by mouth daily.   HYDROXYZINE (ATARAX/VISTARIL) 25 MG TABLET    Take 1 tablet (25 mg total) by mouth 3 (three) times daily as needed.   IBUPROFEN (ADVIL) 200 MG TABLET    Take 200 mg by mouth every 6 (six) hours as needed.   LEDIPASVIR-SOFOSBUVIR (HARVONI) 90-400 MG TABS    Take 1 tablet by mouth daily.   METHADONE (DOLOPHINE) 10 MG/5ML SOLUTION    Take 120 mg by mouth every other day.    PANTOPRAZOLE (PROTONIX) 40 MG TABLET    Take 1 tablet (40 mg total) by mouth daily.   PROMETHAZINE (PHENERGAN) 25 MG TABLET    TAKE 1 TABLET (25 MG TOTAL) BY MOUTH EVERY 8 (EIGHT) HOURS AS NEEDED FOR NAUSEA OR VOMITING.   SUMATRIPTAN (IMITREX) 100 MG TABLET    Take 1 tablet (100 mg total) by mouth once for 1 dose. May repeat in 2 hours if headache persists or recurs.Max- 200mg /day  Modified Medications   No medications on file  Discontinued Medications   No medications on file    Allergies: Allergies  Allergen Reactions  .  Haldol [Haloperidol Decanoate] Other (See Comments)    Hallucinations  . Nitrates, Organic Other (See Comments)    headaches    Past Medical History: Past Medical History:  Diagnosis Date  . Anxiety   . Depression   . Hepatitis C   . Herpes infection   . Hypertension   . Kidney stones   . Migraines   . Narcotic abuse [facility])     Social History: Social History   Socioeconomic History  . Marital status: Divorced    Spouse name: Not on file  . Number of children: Not on file  . Years of education: Not on file  . Highest education level: Not on file  Occupational History  . Not on file  Tobacco Use  . Smoking status: Current Some Day Smoker    Packs/day: 0.50    Years: 0.50    Pack years: 0.25    Types: Cigarettes  . Smokeless tobacco: Never Used  . Tobacco comment: 1/2 pack day  Substance and Sexual Activity  . Alcohol use: No  . Drug use: Yes    Types: Heroin, Marijuana    Comment:  smoked marijuana 4 days ago.  hasn't used heroin since 06/01/2014  . Sexual activity: Not Currently    Birth control/protection: Surgical  Other  Topics Concern  . Not on file  Social History Narrative  . Not on file   Social Determinants of Health   Financial Resource Strain:   . Difficulty of Paying Living Expenses: Not on file  Food Insecurity:   . Worried About Charity fundraiser in the Last Year: Not on file  . Ran Out of Food in the Last Year: Not on file  Transportation Needs:   . Lack of Transportation (Medical): Not on file  . Lack of Transportation (Non-Medical): Not on file  Physical Activity:   . Days of Exercise per Week: Not on file  . Minutes of Exercise per Session: Not on file  Stress:   . Feeling of Stress : Not on file  Social Connections:   . Frequency of Communication with Friends and Family: Not on file  . Frequency of Social Gatherings with Friends and Family: Not on file  . Attends Religious Services: Not on file  . Active Member of Clubs or  Organizations: Not on file  . Attends Archivist Meetings: Not on file  . Marital Status: Not on file    Labs: Hepatitis C Lab Results  Component Value Date   HCVGENOTYPE 1a 01/03/2019   HCVRNAPCRQN 356,000 (H) 01/03/2019   FIBROSTAGE F0 01/03/2019   Hepatitis B Lab Results  Component Value Date   HEPBSAG NON-REACTIVE 01/03/2019   Hepatitis A No results found for: HAV HIV Lab Results  Component Value Date   HIV NON-REACTIVE 01/03/2019   HIV Non Reactive 03/27/2018   Lab Results  Component Value Date   CREATININE 0.78 12/10/2018   CREATININE 0.82 08/26/2018   CREATININE 0.70 12/23/2017   CREATININE 0.78 11/06/2017   CREATININE 0.79 03/22/2017   Lab Results  Component Value Date   AST 29 12/10/2018   AST 58 (H) 08/26/2018   AST 32 11/06/2017   ALT 41 (H) 01/03/2019   ALT 27 12/10/2018   ALT 69 (H) 08/26/2018    Assessment: Nancy Terry arrives in clinic for chronic hepatitis C follow-up. Genotype 1a and HCV viremia of 356 K IU. Her HIV Ab was negative and FibroSURE showed F0 fibrosis. 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 Nancy Terry is uninsured.  Nancy Terry has not started Harvoni x 8 weeks due to the daily nausea and vomiting she has been having for almost 2 years now. She says Phenergan helps but does not take it away completely. She is still unsure of the cause of nausea and vomiting. Nancy Terry says she has an appointment with her doctor at the methadone clinic on Tuesday, February 16. She wants to reduce her dose of methadone because she believes this will reduce the nausea and vomiting. Nancy Terry says she has spoken to her PCP about this issue and she has prescribed other medications in the past that made her feel worse. She says her PCP believes it's related to her hepatitis, but we informed her that we dod not think so and she should see a GI doctor about this. PCP recently prescribed pantoprazole but the patient never  started it. We advised her to not start pantoprazole because it will interact with Harvoni. The patient agreed and is still waiting on a referral to the GI doctor. We advised her to see her PCP and get a referral to GI.   Nancy Terry says she is taking Keflex for 14 days and currently on day 8 for a rash/infection on her right underarm. She  says Keflex has made the rash worse. We explained to her that the rash should be improving by now so she may need to try a different antibiotic. The patient agreed and said she will speak to her PCP tomorrow about it.   Discussed with Nancy Terry the importance of starting Hep C treatment so that hepatitis does not get worse. Explained that it could potentially lead to cirrhosis, liver failure and/or liver cancer. We told the patient that when she starts treatment, she will take Harvoni once daily for 8 weeks. Advised her to take it with a meal to decrease likelihood of nausea and vomiting. Informed her she may have a headache or dizziness for the first few days but it should subside.   Nancy Terry is focused on getting the nausea, vomiting, and rash under control before starting Harvoni next week. She is supposed to see her PCP tomorrow and will meet with her doctor at the methadone clinic next week. We told the patient to call us on next Wednesday to see if she and her doctors are ok with her starting Harvoni. She understands that once she starts taking Harvoni, it is best to complete the 8 weeks to decrease chance of resistance or treatment failure. The patient will keep [place] updated on her care.   Plan: - Patient will contact us on Wednesday, February 17 about appointments with her PCP and provider at methadone clinic over the next week. - Will hopefully start Harvoni x 8 weeks on Wednesday, February 17 - Will schedule follow-up with Nancy Terry via telephone - Vaccinations needed: hep A, hep B, and influenza  Paulette Blanch, 4th Year PharmD Candidate

## 2019-07-19 ENCOUNTER — Other Ambulatory Visit: Payer: Self-pay

## 2019-07-19 ENCOUNTER — Ambulatory Visit (INDEPENDENT_AMBULATORY_CARE_PROVIDER_SITE_OTHER): Payer: Self-pay | Admitting: Pharmacist

## 2019-07-19 DIAGNOSIS — B182 Chronic viral hepatitis C: Secondary | ICD-10-CM

## 2019-07-20 ENCOUNTER — Encounter: Payer: Self-pay | Admitting: Internal Medicine

## 2019-07-20 ENCOUNTER — Ambulatory Visit: Payer: Self-pay | Attending: Internal Medicine | Admitting: Internal Medicine

## 2019-07-20 ENCOUNTER — Other Ambulatory Visit: Payer: Self-pay

## 2019-07-20 VITALS — BP 114/78 | HR 61 | Ht 64.0 in | Wt 239.0 lb

## 2019-07-20 DIAGNOSIS — F319 Bipolar disorder, unspecified: Secondary | ICD-10-CM | POA: Insufficient documentation

## 2019-07-20 DIAGNOSIS — L732 Hidradenitis suppurativa: Secondary | ICD-10-CM | POA: Insufficient documentation

## 2019-07-20 DIAGNOSIS — R21 Rash and other nonspecific skin eruption: Secondary | ICD-10-CM

## 2019-07-20 DIAGNOSIS — F17210 Nicotine dependence, cigarettes, uncomplicated: Secondary | ICD-10-CM | POA: Insufficient documentation

## 2019-07-20 DIAGNOSIS — F419 Anxiety disorder, unspecified: Secondary | ICD-10-CM | POA: Insufficient documentation

## 2019-07-20 DIAGNOSIS — B369 Superficial mycosis, unspecified: Secondary | ICD-10-CM | POA: Insufficient documentation

## 2019-07-20 DIAGNOSIS — K219 Gastro-esophageal reflux disease without esophagitis: Secondary | ICD-10-CM | POA: Insufficient documentation

## 2019-07-20 DIAGNOSIS — N920 Excessive and frequent menstruation with regular cycle: Secondary | ICD-10-CM | POA: Insufficient documentation

## 2019-07-20 DIAGNOSIS — L309 Dermatitis, unspecified: Secondary | ICD-10-CM | POA: Insufficient documentation

## 2019-07-20 DIAGNOSIS — Z79899 Other long term (current) drug therapy: Secondary | ICD-10-CM | POA: Insufficient documentation

## 2019-07-20 DIAGNOSIS — G43909 Migraine, unspecified, not intractable, without status migrainosus: Secondary | ICD-10-CM | POA: Insufficient documentation

## 2019-07-20 DIAGNOSIS — L308 Other specified dermatitis: Secondary | ICD-10-CM

## 2019-07-20 DIAGNOSIS — I10 Essential (primary) hypertension: Secondary | ICD-10-CM | POA: Insufficient documentation

## 2019-07-20 MED ORDER — NYSTATIN 100000 UNIT/GM EX POWD: CUTANEOUS | 0 refills | Status: DC

## 2019-07-20 MED ORDER — DOXYCYCLINE HYCLATE 100 MG PO TABS: 100.0000 mg | ORAL_TABLET | Freq: Two times a day (BID) | ORAL | 0 refills | Status: DC

## 2019-07-20 MED FILL — NYSTATIN 100000 UNIT/GM POW: 100000 | 7 days supply | Qty: 15 | Fill #0

## 2019-07-20 NOTE — Progress Notes (Signed)
Rash under right arm,has taken antibiotics and still has rash.

## 2019-07-20 NOTE — Progress Notes (Signed)
Patient ID: Nancy Terry, female    DOB: [birth year]  MRN: [id number]  CC: Rash   Subjective: Nancy Terry is a 44 y.o. female who presents for UC visit for rash in RT axilla Her concerns today include:  history of bipolar disorder, anxiety and depression, previous history of opioid abuse (currently on methadone), migraines, insomnia, GERD, sacroiliac joint dysfunction.  PCP is Dr. Alvis Lemmings  Patient had a telephone encounter with Dr. Lonni Fix on 07/04/2019 with complaints of draining lesions  on her right arm which she has had for a year.  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 Today she reports "my arm is all mess up and the abx has made it worse." -The issue is in the right axilla. -Started as blisters in both arm pits about 1 yr ago. Went away on LT side but never resolved on RT.  Getting worse.  Itches, burns, smells and intermittent pus drainage.  She is not able to use deodorant on the underarms because of this. -After she started the Keflex antibiotics, she reports that the skin became red and inflamed..  -No drainage at this time. No fever Patient Active Problem List   Diagnosis Date Noted  . Chronic pain syndrome 04/11/2018  . Complex ovarian cyst 11/21/2017  . Hypertension 11/21/2017  . Menorrhagia 05/13/2017  . Sciatica 05/13/2017  . Migraines 05/07/2016  . Hepatitis C 05/07/2016  . Depression 04/05/2016  . Anxiety and depression 04/05/2016  . Narcotic abuse (HCC) 04/05/2016  . Breast lump on right side at 10 o'clock position 03/12/2014  . Pap smear of cervix shows high risk HPV present 03/12/2014     Current Outpatient Medications on File Prior to Visit  Medication Sig Dispense Refill  . methadone (DOLOPHINE) 10 MG/5ML solution Take 120 mg by mouth every other day.     . promethazine (PHENERGAN) 25 MG tablet TAKE 1 TABLET (25 MG TOTAL) BY MOUTH EVERY 8 (EIGHT) HOURS AS NEEDED FOR NAUSEA OR VOMITING. 30 tablet 1  . cephALEXin  (KEFLEX) 500 MG capsule Take 1 capsule (500 mg total) by mouth 2 (two) times daily. (Patient not taking: Reported on 07/20/2019) 14 capsule 0  . DULoxetine (CYMBALTA) 60 MG capsule Take 1 capsule (60 mg total) by mouth daily. (Patient not taking: Reported on 01/03/2019) 30 capsule 3  . hydrOXYzine (ATARAX/VISTARIL) 25 MG tablet Take 1 tablet (25 mg total) by mouth 3 (three) times daily as needed. (Patient not taking: Reported on 01/03/2019) 30 tablet 1  . ibuprofen (ADVIL) 200 MG tablet Take 200 mg by mouth every 6 (six) hours as needed.    . Ledipasvir-Sofosbuvir (HARVONI) 90-400 MG TABS Take 1 tablet by mouth daily. (Patient not taking: Reported on 07/20/2019) 28 tablet 1  . pantoprazole (PROTONIX) 40 MG tablet Take 1 tablet (40 mg total) by mouth daily. (Patient not taking: Reported on 07/20/2019) 30 tablet 1  . SUMAtriptan (IMITREX) 100 MG tablet Take 1 tablet (100 mg total) by mouth once for 1 dose. May repeat in 2 hours if headache persists or recurs.Max- 200mg /day 10 tablet 1   No current facility-administered medications on file prior to visit.    Allergies  Allergen Reactions  . Haldol [Haloperidol Decanoate] Other (See Comments)    Hallucinations  . Nitrates, Organic Other (See Comments)    headaches    Social History   Socioeconomic History  . Marital status: Divorced    Spouse name: Not on file  . Number of children: Not  on file  . Years of education: Not on file  . Highest education level: Not on file  Occupational History  . Not on file  Tobacco Use  . Smoking status: Current Some Day Smoker    Packs/day: 0.50    Years: 0.50    Pack years: 0.25    Types: Cigarettes  . Smokeless tobacco: Never Used  . Tobacco comment: 1/2 pack day  Substance and Sexual Activity  . Alcohol use: No  . Drug use: Yes    Types: Heroin, Marijuana    Comment:  smoked marijuana 4 days ago.  hasn't used heroin since 06/01/2014  . Sexual activity: Not Currently    Birth control/protection:  Surgical  Other Topics Concern  . Not on file  Social History Narrative  . Not on file   Social Determinants of Health   Financial Resource Strain:   . Difficulty of Paying Living Expenses: Not on file  Food Insecurity:   . Worried About Programme researcher, broadcasting/film/video in the Last Year: Not on file  . Ran Out of Food in the Last Year: Not on file  Transportation Needs:   . Lack of Transportation (Medical): Not on file  . Lack of Transportation (Non-Medical): Not on file  Physical Activity:   . Days of Exercise per Week: Not on file  . Minutes of Exercise per Session: Not on file  Stress:   . Feeling of Stress : Not on file  Social Connections:   . Frequency of Communication with Friends and Family: Not on file  . Frequency of Social Gatherings with Friends and Family: Not on file  . Attends Religious Services: Not on file  . Active Member of Clubs or Organizations: Not on file  . Attends Banker Meetings: Not on file  . Marital Status: Not on file  Intimate Partner Violence:   . Fear of Current or Ex-Partner: Not on file  . Emotionally Abused: Not on file  . Physically Abused: Not on file  . Sexually Abused: Not on file    Family History  Problem Relation Age of Onset  . Parkinson's disease Mother   . Hypertension Mother   . Parkinson's disease Maternal Grandmother   . Hypertension Maternal Grandmother   . Cancer Maternal Grandmother        lung  . Glaucoma Maternal Grandmother     Past Surgical History:  Procedure Laterality Date  . arm surgery    . left elbow surgery     . TUBAL LIGATION      ROS: Review of Systems Negative except as stated above  PHYSICAL EXAM: BP 114/78   Pulse 61   Ht 5\' 4"  (1.626 m)   Wt 239 lb (108.4 kg)   SpO2 98%   BMI 41.02 kg/m   Physical Exam  General appearance - alert, well appearing, and in no distress Mental status - normal mood, behavior, speech, dress, motor activity, and thought processes Skin -right axilla:  Area is erythematous and tender to touch.  No fluctuance appreciated.  She has what looks like scarring in some areas.  She reports that these were the areas that intermittently drain pus.  I have captured a picture of the right axilla and pasted below     CMP Latest Ref Rng & Units 01/03/2019 12/10/2018 08/26/2018  Glucose 70 - 99 mg/dL - 08/28/2018) 098(J)  BUN 6 - 20 mg/dL - 11 11  Creatinine 191(Y - 1.00 mg/dL - 7.82 9.56  Sodium  135 - 145 mmol/L - 138 137  Potassium 3.5 - 5.1 mmol/L - 3.4(L) 3.7  Chloride 98 - 111 mmol/L - 103 107  CO2 22 - 32 mmol/L - 24 25  Calcium 8.9 - 10.3 mg/dL - 9.4 9.2  Total Protein 6.5 - 8.1 g/dL - 8.8(H) 7.4  Total Bilirubin 0.3 - 1.2 mg/dL - 0.7 0.5  Alkaline Phos 38 - 126 U/L - 85 73  AST 15 - 41 U/L - 29 58(H)  ALT 6 - 29 U/L 41(H) 27 69(H)   Lipid Panel     Component Value Date/Time   CHOL 102 03/22/2017 0947   TRIG 130 03/22/2017 0947   HDL 24 (L) 03/22/2017 0947   CHOLHDL 4.3 03/22/2017 0947   CHOLHDL 4.9 06/14/2014 0851   VLDL 31 06/14/2014 0851   LDLCALC 52 03/22/2017 0947    CBC    Component Value Date/Time   WBC 9.5 12/10/2018 1218   RBC 5.49 (H) 12/10/2018 1218   HGB 13.3 12/10/2018 1218   HGB 12.5 03/27/2018 1442   HCT 44.2 12/10/2018 1218   HCT 37.5 03/27/2018 1442   PLT 369 12/10/2018 1218   PLT 362 03/27/2018 1442   MCV 80.5 12/10/2018 1218   MCV 81 03/27/2018 1442   MCH 24.2 (L) 12/10/2018 1218   MCHC 30.1 12/10/2018 1218   RDW 15.5 12/10/2018 1218   RDW 14.3 03/27/2018 1442   LYMPHSABS 1.1 12/10/2018 1218   LYMPHSABS 2.8 03/27/2018 1442   MONOABS 0.4 12/10/2018 1218   EOSABS 0.0 12/10/2018 1218   EOSABS 0.3 03/27/2018 1442   BASOSABS 0.0 12/10/2018 1218   BASOSABS 0.1 03/27/2018 1442    ASSESSMENT AND PLAN: 1. Dermatitis 2. Hidradenitis -This looks like hidradenitis recurrent overlying fungal dermatitis. I have prescribed some nystatin powder Change antibiotics to doxycycline - Ambulatory referral to  Dermatology - nystatin (MYCOSTATIN/NYSTOP) powder; Apply to affected area twice a day.  Dispense: 15 g; Refill: 0 - doxycycline (VIBRA-TABS) 100 MG tablet; Take 1 tablet (100 mg total) by mouth 2 (two) times daily.  Dispense: 14 tablet; Refill: 0  Patient was given the opportunity to ask questions.  Patient verbalized understanding of the plan and was able to repeat key elements of the plan.   Orders Placed This Encounter  Procedures  . Ambulatory referral to Dermatology     Requested Prescriptions   Signed Prescriptions Disp Refills  . nystatin (MYCOSTATIN/NYSTOP) powder 15 g 0    Sig: Apply to affected area twice a day.    No follow-ups on file.  Karle Plumber, MD, FACP

## 2019-07-23 MED FILL — ?DOXYCYCLINE HYCLATE 100MG: 100 | 7 days supply | Qty: 14 | Fill #0

## 2019-08-02 ENCOUNTER — Telehealth: Payer: Self-pay | Admitting: Family Medicine

## 2019-08-02 DIAGNOSIS — R112 Nausea with vomiting, unspecified: Secondary | ICD-10-CM

## 2019-08-02 NOTE — Telephone Encounter (Signed)
Pt called to talk about a referral to Gi, she states she has been seen in the past for her Nausea and morning sickness, she sees no improvement so wants to know if a Gi specialist would be able to help with this issue. Please advise

## 2019-08-02 NOTE — Telephone Encounter (Signed)
Patient is requesting a GI referral °

## 2019-08-03 ENCOUNTER — Encounter: Payer: Self-pay | Admitting: Nurse Practitioner

## 2019-08-03 NOTE — Telephone Encounter (Signed)
I have placed referral to GI

## 2019-08-03 NOTE — Telephone Encounter (Signed)
Patient was called and informed of referral being placed. 

## 2019-08-13 ENCOUNTER — Ambulatory Visit: Payer: Medicaid Other | Admitting: Family Medicine

## 2019-08-14 ENCOUNTER — Ambulatory Visit
Admission: RE | Admit: 2019-08-14 | Discharge: 2019-08-14 | Disposition: A | Discharge status: Home / Self Care | Payer: No Typology Code available for payment source | Source: Ambulatory Visit | Attending: Obstetrics and Gynecology | Admitting: Obstetrics and Gynecology

## 2019-08-14 ENCOUNTER — Encounter: Payer: Self-pay | Admitting: Women's Health

## 2019-08-14 ENCOUNTER — Ambulatory Visit: Payer: Self-pay | Admitting: Women's Health

## 2019-08-14 ENCOUNTER — Other Ambulatory Visit: Payer: Self-pay

## 2019-08-14 VITALS — BP 140/70 | Temp 98.4°F | Wt 235.0 lb

## 2019-08-14 DIAGNOSIS — Z1231 Encounter for screening mammogram for malignant neoplasm of breast: Secondary | ICD-10-CM

## 2019-08-14 DIAGNOSIS — Z1239 Encounter for other screening for malignant neoplasm of breast: Secondary | ICD-10-CM

## 2019-08-14 DIAGNOSIS — Z124 Encounter for screening for malignant neoplasm of cervix: Secondary | ICD-10-CM

## 2019-08-14 NOTE — Progress Notes (Signed)
Ms. Nancy Terry is a 44 y.o. [id number] female who presents to [facility] clinic today with no complaints.    Pap Smear: Pap smear completed today. Last Pap smear was 03/12/2014 at [facility] clinic and was abnormal - NILM/hrHPV+. Per patient has no history of an abnormal Pap smear. Last Pap smear result is available in Epic.   Physical exam: Breasts Breasts symmetrical. No skin abnormalities bilateral breasts. No nipple retraction bilateral breasts. No nipple discharge bilateral breasts. No lymphadenopathy. No lumps palpated bilateral breasts.       Pelvic/Bimanual Ext Genitalia No lesions, no swelling and no discharge observed on external genitalia.        Vagina Vagina pink and normal texture. No lesions or discharge observed in vagina.        Cervix Cervix is present. Cervix pink and of normal texture. No discharge observed.    Uterus Uterus is present, difficult to examine due to body habitus. Uterus in normal position and normal size.        Adnexae Bilateral ovaries present, difficult to examine due to body habitus. No tenderness on palpation.         Rectovaginal No rectal exam completed today since patient had no rectal complaints. No skin abnormalities observed on exam.     Smoking History: Patient has never smoked not referred to quit line.    Patient Navigation: Patient education provided. Access to services provided for patient through [facility] program. No interpreter provided. No transportation provided   Colorectal Cancer Screening: Per patient has never had colonoscopy completed No complaints today.    Breast and Cervical Cancer Risk Assessment: Patient does not have family history of breast cancer, known genetic mutations, or radiation treatment to the chest before age 32. Patient does not have history of cervical dysplasia, immunocompromised, or DES exposure in-utero.  Risk Assessment    Risk Scores      08/14/2019   Last edited by: Narda Rutherford, LPN   5-year risk: 0.5  %   Lifetime risk: 6.4 %          A: [facility]CP exam with pap smear Complaint of none.  P: Referred patient to the [facility] for a screening mammogram. Appointment scheduled 08/14/2019.  Marylen Ponto, NP 08/14/2019 1:56 PM

## 2019-08-14 NOTE — Patient Instructions (Signed)
Preventing Cervical Cancer Cervical cancer is cancer that grows on the cervix. The cervix is at the bottom of the uterus. It connects the uterus to the vagina. The uterus is where a baby develops during pregnancy. Cancer occurs when cells become abnormal and start to grow out of control. If cervical cancer is not found early, it can spread and become dangerous. Cervical cancer cannot always be prevented, but you can take steps to lower your risk of developing this condition. How can this condition affect me? Cervical cancer grows slowly and may not cause any symptoms at first. Over time, the cancer can grow deep into the cervix tissue and spread to other areas. This may take years, and it may happen without you knowing about it. If it is found early, cervical cancer can be treated effectively. If the cancer has grown deep into your cervix or has spread, it will be more difficult to treat. Most cases of cervical cancer are caused by an STI (sexually transmitted infection) called human papillomavirus (HPV). One way to reduce your risk of cervical cancer is to take steps to avoid infection with the HPV virus. Getting regular Pap tests is also important because this can help identify changes in cells that could lead to cancer. Your chances of getting this disease can also be reduced by making certain lifestyle changes. What can increase my risk? You are more likely to develop this condition if:  You have certain things in your sexual history, such as: ? Having a sexually transmitted viral infection. These include chlamydia and herpes. ? Having more than one sexual partner, or having sex with someone who has more than one sexual partner. ? Not using condoms during sex. ? Having been sexually active before the age of 18.  Your mother took a medicine called diethylstilbestrol (DES) while pregnant with you, causing you to be exposed to this medicine before birth.  Your mother or sister has had cervical  cancer.  You are between the ages of 40-50.  You have or have had certain other medical conditions, such as: ? Previous cancer of the vagina or vulva. ? A weakened body defense system (immune system). ? A history of dysplasia of the cervix.  You use oral contraceptives, also called birth control pills.  You smoke or breathe in secondhand smoke. What actions can I take to prevent cervical cancer? Preventing HPV infection   Ask your health care provider about getting the HPV vaccine. If you are 26 years old or younger, you may need to get this vaccine, which is given in three doses over 6 months. This vaccine protects against the types of HPV that could cause cancer.  Limit the number of people you have sex with. Also avoid having sex with people who have had many sex partners.  Use a latex condom every time you have sex. Getting Pap tests Get Pap tests regularly, starting at age 21. Talk with your health care provider about how often you need these tests. Having regular Pap tests will help identify changes in cells that could lead to cancer. Steps can then be taken to prevent cancer from developing.  Most women who are 21?46 years of age should have a Pap test every 3 years.  Most women who are 30?46 years of age should have a Pap test in combination with an HPV test every 5 years.  Women with a higher risk of cervical cancer, such as those with a weakened immune system or those who were   exposed to DES medicine before birth, may need more frequent testing. Making other lifestyle changes   Do not use any products that contain nicotine or tobacco, such as cigarettes, e-cigarettes, and chewing tobacco. If you need help quitting, ask your health care provider.  Eat a healthy diet that includes at least 5 servings of fruits and vegetables every day.  Lose weight if you are overweight. Where to find support Talk with your health care provider, school nurse, or local health department  for guidance about screening and vaccination. Some children and teens may be able to get the HPV vaccine free of charge through the [place] government's Vaccines for Children (VFC) program. Other places that provide vaccinations include:  Public health clinics. Check with your local health department.  Federally Qualified Health Centers, where you would pay only what you can afford. To find one near you, check this website: www.fqhc.org/find-an-fqhc/  Rural Health Clinics. These are part of a program for Medicare and Medicaid patients who live in rural areas. The National Breast and Cervical Cancer Early Detection Program also provides breast and cervical cancer screenings and diagnostic services to low-income, uninsured, and underinsured women. Cervical cancer can be passed down through families. Talk with your health care provider or a genetic counselor to learn more about genetic testing for cancer. Where to find more information Learn more about cervical cancer from:  American College of Gynecology: www.acog.org  American Cancer Society: www.cancer.org  Centers for Disease Control and Prevention: www.cdc.gov Contact a health care provider if you have:  Pelvic pain.  Unusual discharge or bleeding from your vagina. Summary  Cervical cancer is cancer that grows on the cervix. The cervix is at the bottom of the uterus.  Ask your health care provider about getting the HPV vaccine.  Be sure to get regular Pap tests as recommended by your health care provider.  See your health care provider right away if you have any pelvic pain or unusual discharge or bleeding from your vagina. This information is not intended to replace advice given to you by your health care provider. Make sure you discuss any questions you have with your health care provider. Document Revised: 12/25/2018 Document Reviewed: 12/25/2018 Elsevier Patient Education  2020 Elsevier Inc. Breast Self-Awareness Breast  self-awareness means being familiar with how your breasts look and feel. It involves checking your breasts regularly and reporting any changes to your health care provider. Practicing breast self-awareness is important. Sometimes changes may not be harmful (are benign), but sometimes a change in your breasts can be a sign of a serious medical problem. It is important to learn how to do this procedure correctly so that you can catch problems early, when treatment is more likely to be successful. All women should practice breast self-awareness, including women who have had breast implants. What you need:  A mirror.  A well-lit room. How to do a breast self-exam A breast self-exam is one way to learn what is normal for your breasts and whether your breasts are changing. To do a breast self-exam: Look for changes  1. Remove all the clothing above your waist. 2. Stand in front of a mirror in a room with good lighting. 3. Put your hands on your hips. 4. Push your hands firmly downward. 5. Compare your breasts in the mirror. Look for differences between them (asymmetry), such as: ? Differences in shape. ? Differences in size. ? Puckers, dips, and bumps in one breast and not the other. 6. Look at each   breast for changes in the skin, such as: ? Redness. ? Scaly areas. 7. Look for changes in your nipples, such as: ? Discharge. ? Bleeding. ? Dimpling. ? Redness. ? A change in position. Feel for changes Carefully feel your breasts for lumps and changes. It is best to do this while lying on your back on the floor, and again while sitting or standing in the tub or shower with soapy water on your skin. Feel each breast in the following way: 1. Place the arm on the side of the breast you are examining above your head. 2. Feel your breast with the other hand. 3. Start in the nipple area and make -inch (2 cm) overlapping circles to feel your breast. Use the pads of your three middle fingers to do  this. Apply light pressure, then medium pressure, then firm pressure. The light pressure will allow you to feel the tissue closest to the skin. The medium pressure will allow you to feel the tissue that is a little deeper. The firm pressure will allow you to feel the tissue close to the ribs. 4. Continue the overlapping circles, moving downward over the breast until you feel your ribs below your breast. 5. Move one finger-width toward the center of the body. Continue to use the -inch (2 cm) overlapping circles to feel your breast as you move slowly up toward your collarbone. 6. Continue the up-and-down exam using all three pressures until you reach your armpit.  Write down what you find Writing down what you find can help you remember what to discuss with your health care provider. Write down:  What is normal for each breast.  Any changes that you find in each breast, including: ? The kind of changes you find. ? Any pain or tenderness. ? Size and location of any lumps.  Where you are in your menstrual cycle, if you are still menstruating. General tips and recommendations  Examine your breasts every month.  If you are breastfeeding, the best time to examine your breasts is after a feeding or after using a breast pump.  If you menstruate, the best time to examine your breasts is 5-7 days after your period. Breasts are generally lumpier during menstrual periods, and it may be more difficult to notice changes.  With time and practice, you will become more familiar with the variations in your breasts and more comfortable with the exam. Contact a health care provider if you:  See a change in the shape or size of your breasts or nipples.  See a change in the skin of your breast or nipples, such as a reddened or scaly area.  Have unusual discharge from your nipples.  Find a lump or thick area that was not there before.  Have pain in your breasts.  Have any concerns related to your  breast health. Summary  Breast self-awareness includes looking for physical changes in your breasts, as well as feeling for any changes within your breasts.  Breast self-awareness should be performed in front of a mirror in a well-lit room.  You should examine your breasts every month. If you menstruate, the best time to examine your breasts is 5-7 days after your menstrual period.  Let your health care provider know of any changes you notice in your breasts, including changes in size, changes on the skin, pain or tenderness, or unusual fluid from your nipples. This information is not intended to replace advice given to you by your health care provider. Make sure   you discuss any questions you have with your health care provider. Document Revised: 01/10/2018 Document Reviewed: 01/10/2018 Elsevier Patient Education  2020 Elsevier Inc.  

## 2019-08-15 ENCOUNTER — Ambulatory Visit (INDEPENDENT_AMBULATORY_CARE_PROVIDER_SITE_OTHER): Payer: Self-pay | Admitting: Nurse Practitioner

## 2019-08-15 ENCOUNTER — Encounter: Payer: Self-pay | Admitting: Nurse Practitioner

## 2019-08-15 ENCOUNTER — Ambulatory Visit: Payer: Medicaid Other | Admitting: Gastroenterology

## 2019-08-15 VITALS — BP 110/64 | HR 68 | Temp 98.3°F | Ht 63.25 in | Wt 237.1 lb

## 2019-08-15 DIAGNOSIS — Z01818 Encounter for other preprocedural examination: Secondary | ICD-10-CM

## 2019-08-15 DIAGNOSIS — R112 Nausea with vomiting, unspecified: Secondary | ICD-10-CM

## 2019-08-15 DIAGNOSIS — R194 Change in bowel habit: Secondary | ICD-10-CM

## 2019-08-15 LAB — CYTOLOGY - PAP
Comment: NEGATIVE
Diagnosis: NEGATIVE
High risk HPV: POSITIVE — AB

## 2019-08-15 NOTE — Progress Notes (Signed)
Referring Provider: Hoy Register, MD  Reason for Referral :   nausea          ASSESSMENT / PLAN:   Nancy Terry is a 44 y.o. female with a pmh significant for, but not necessarily limited to, hepatitis C, anxiety, depression, history of opioid abuse ( currently on methadone), headaches  # Chronic nausea / vomiting ( two years)  -No weight loss, + weight gain -Generally occurs only in the a.m. -Suspect gastroparesis, ? Idiopathic. She has been on Methadone for years but no N/V until two years ago so not sure Methadone responsisible. Will arrange for EGD to look for other etiologies. If EGD negative, consider GES though would probably need to be off Methadone prior to study  # Hepatitis C -Plan is to start Harvoni after evaluation and treatment of nausea/vomiting  # Hx of opioid abuse , on Methadone for several years.   # Alternating bowel habits for 5 years -Start daily metamucil -64 oz water daily -If determined to have gastroparesis then fiber supplement may need to be stopped - can contribute to bezoar in some patients   # Colon cancer screening.  -Not anemia ( hgb 13.2 in July). No blood in stool.  -Should get colonoscopy at some point. Right now with frequent vomiting I doubt prep would be tolerated.    HPI:     Chief Complaint:  Nausea / vomiting    ** History comes from chart and patient  This patient is a 44 yo female, new to the practice her with chronic nausea / vomiting present for about two years. Symptoms occur in the morning about 3 times a week and usually wakes her up from sleep.  She cannot sleep flat as it causes bloating and sensation that food is coming up in her chest though she has no actual heartburn or regurgitation of gastric contents into her mouth . Usually not symptomatic throughout the day, just in am. Generally vomits phlegm and / or old food. She takes Ibuprofen about twice a week. She has been on Methadone for years. It initially caused some  nausea but it resolved. She has hepatitis C and has Harvoni but ID asked her to hold off on starting medication until nausea / vomiting worked up. She takes Phenergan as needed but can only take about every 3 days due to fatigue / sleepiness it causes. The chronic nauea / vomiting is not associated with abdominal pain nor weight loss.  Over the last 2 to 3 years she has gained about 80 pounds.  Over the last 5 years she has had alternating constipation/diarrhea.  Patient says she does not drink much water nor consume a high-fiber diet.   Past Medical History:  Diagnosis Date  . Anxiety   . Depression   . Hepatitis C   . Herpes infection   . Hypertension   . Kidney stones   . Migraines   . Narcotic abuse [facility])      Past Surgical History:  Procedure Laterality Date  . ELBOW SURGERY Left   . KIDNEY STONE SURGERY     with stent placement  . LITHOTRIPSY    . TUBAL LIGATION     Family History  Problem Relation Age of Onset  . Parkinson's disease Mother   . Hypertension Mother   . Rheum arthritis Mother   . Heart failure Mother   . Parkinson's disease Maternal Grandmother   . Hypertension Maternal Grandmother   . Glaucoma Maternal Grandmother   .  Lung cancer Maternal Grandmother    Social History   Tobacco Use  . Smoking status: Current Every Day Smoker    Packs/day: 0.50    Years: 0.50    Pack years: 0.25    Types: Cigarettes  . Smokeless tobacco: Never Used  . Tobacco comment: 1/2 pack day  Substance Use Topics  . Alcohol use: No  . Drug use: Not Currently    Types: Heroin, Marijuana    Comment:  smoked marijuana 4 days ago.  hasn't used heroin since 06/01/2014   Current Outpatient Medications  Medication Sig Dispense Refill  . ibuprofen (ADVIL) 200 MG tablet Take 200 mg by mouth every 6 (six) hours as needed.    . methadone (DOLOPHINE) 10 MG/5ML solution Take 120 mg by mouth every other day.     . promethazine (PHENERGAN) 25 MG tablet TAKE 1 TABLET (25 MG TOTAL) BY  MOUTH EVERY 8 (EIGHT) HOURS AS NEEDED FOR NAUSEA OR VOMITING. 30 tablet 1  . Ledipasvir-Sofosbuvir (HARVONI) 90-400 MG TABS Take 1 tablet by mouth daily. (Patient not taking: Reported on 07/20/2019) 28 tablet 1   No current facility-administered medications for this visit.   Allergies  Allergen Reactions  . Haldol [Haloperidol Decanoate] Other (See Comments)    Hallucinations  . Nitrates, Organic Other (See Comments)    headaches     Review of Systems:  All systems reviewed and negative except where noted in HPI.   Creatinine clearance cannot be calculated (Patient's most recent lab result is older than the maximum 21 days allowed.)   Physical Exam:    Wt Readings from Last 3 Encounters:  08/15/19 237 lb 2 oz (107.6 kg)  08/14/19 235 lb (106.6 kg)  07/20/19 239 lb (108.4 kg)    BP 110/64 (BP Location: Left Arm, Patient Position: Sitting, Cuff Size: Normal)   Pulse 68   Temp 98.3 F (36.8 C)   Ht 5' 3.25" (1.607 m) Comment: height measured without shoes  Wt 237 lb 2 oz (107.6 kg)   LMP 07/31/2019 (Exact Date)   BMI 41.67 kg/m  Constitutional:  Pleasant female in no acute distress. Psychiatric: Normal mood and affect. Behavior is normal. EENT: Pupils normal.  Conjunctivae are normal. No scleral icterus. Neck supple.  Cardiovascular: Normal rate, regular rhythm. No edema Pulmonary/chest: Effort normal and breath sounds normal. No wheezing, rales or rhonchi. Abdominal: Soft, nondistended, nontender. Bowel sounds active throughout. There are no masses palpable. No hepatomegaly. Neurological: Alert and oriented to person place and time. Skin: Skin is warm and dry. No rashes noted.  Tye Savoy, NP  08/15/2019, 12:19 PM  Cc:  Referring Provider Nancy Rakes, MD

## 2019-08-15 NOTE — Patient Instructions (Addendum)
If you are age 46 or older, your body mass index should be between 23-30. Your Body mass index is 41.67 kg/m. If this is out of the aforementioned range listed, please consider follow up with your Primary Care Provider.  If you are age 74 or younger, your body mass index should be between 19-25. Your Body mass index is 41.67 kg/m. If this is out of the aformentioned range listed, please consider follow up with your Primary Care Provider.   You have been scheduled for an endoscopy. Please follow written instructions given to you at your visit today. If you use inhalers (even only as needed), please bring them with you on the day of your procedure. Your physician has requested that you go to www.startemmi.com and enter the access code given to you at your visit today. This web site gives a general overview about your procedure. However, you should still follow specific instructions given to you by our office regarding your preparation for the procedure.  Start Metamucil daily.  This is over-the-counter.  Drink 64 ounces of water daily.  Follow up with me after EGD.  Call the office for an appointment as the schedule is not available at this time.  Thank you for choosing me and [facility].   Willette Cluster, NP

## 2019-08-16 ENCOUNTER — Other Ambulatory Visit: Payer: Self-pay | Admitting: Obstetrics and Gynecology

## 2019-08-16 DIAGNOSIS — R928 Other abnormal and inconclusive findings on diagnostic imaging of breast: Secondary | ICD-10-CM

## 2019-08-27 ENCOUNTER — Ambulatory Visit: Payer: Self-pay

## 2019-08-27 ENCOUNTER — Other Ambulatory Visit: Payer: Self-pay

## 2019-08-29 ENCOUNTER — Telehealth: Payer: Self-pay

## 2019-08-29 ENCOUNTER — Ambulatory Visit: Payer: Medicaid Other | Attending: Family Medicine | Admitting: Family Medicine

## 2019-08-29 ENCOUNTER — Ambulatory Visit
Admission: RE | Admit: 2019-08-29 | Discharge: 2019-08-29 | Disposition: A | Discharge status: Home / Self Care | Payer: No Typology Code available for payment source | Source: Ambulatory Visit | Attending: Obstetrics and Gynecology | Admitting: Obstetrics and Gynecology

## 2019-08-29 ENCOUNTER — Other Ambulatory Visit: Payer: Self-pay

## 2019-08-29 ENCOUNTER — Other Ambulatory Visit: Payer: Self-pay | Admitting: Obstetrics and Gynecology

## 2019-08-29 ENCOUNTER — Encounter: Payer: Self-pay | Admitting: Family Medicine

## 2019-08-29 VITALS — BP 104/65 | HR 60 | Ht 64.0 in | Wt 236.0 lb

## 2019-08-29 DIAGNOSIS — N920 Excessive and frequent menstruation with regular cycle: Secondary | ICD-10-CM

## 2019-08-29 DIAGNOSIS — L732 Hidradenitis suppurativa: Secondary | ICD-10-CM

## 2019-08-29 DIAGNOSIS — R600 Localized edema: Secondary | ICD-10-CM

## 2019-08-29 DIAGNOSIS — R928 Other abnormal and inconclusive findings on diagnostic imaging of breast: Secondary | ICD-10-CM

## 2019-08-29 DIAGNOSIS — N6489 Other specified disorders of breast: Secondary | ICD-10-CM

## 2019-08-29 DIAGNOSIS — Z6841 Body Mass Index (BMI) 40.0 and over, adult: Secondary | ICD-10-CM

## 2019-08-29 DIAGNOSIS — I872 Venous insufficiency (chronic) (peripheral): Secondary | ICD-10-CM

## 2019-08-29 NOTE — Patient Instructions (Signed)
Edema  Edema is when you have too much fluid in your body or under your skin. Edema may make your legs, feet, and ankles swell up. Swelling is also common in looser tissues, like around your eyes. This is a common condition. It gets more common as you get older. There are many possible causes of edema. Eating too much salt (sodium) and being on your feet or sitting for a long time can cause edema in your legs, feet, and ankles. Hot weather may make edema worse. Edema is usually painless. Your skin may look swollen or shiny. Follow these instructions at home:  Keep the swollen body part raised (elevated) above the level of your heart when you are sitting or lying down.  Do not sit still or stand for a long time.  Do not wear tight clothes. Do not wear garters on your upper legs.  Exercise your legs. This can help the swelling go down.  Wear elastic bandages or support stockings as told by your doctor.  Eat a low-salt (low-sodium) diet to reduce fluid as told by your doctor.  Depending on the cause of your swelling, you may need to limit how much fluid you drink (fluid restriction).  Take over-the-counter and prescription medicines only as told by your doctor. Contact a doctor if:  Treatment is not working.  You have heart, liver, or kidney disease and have symptoms of edema.  You have sudden and unexplained weight gain. Get help right away if:  You have shortness of breath or chest pain.  You cannot breathe when you lie down.  You have pain, redness, or warmth in the swollen areas.  You have heart, liver, or kidney disease and get edema all of a sudden.  You have a fever and your symptoms get worse all of a sudden. Summary  Edema is when you have too much fluid in your body or under your skin.  Edema may make your legs, feet, and ankles swell up. Swelling is also common in looser tissues, like around your eyes.  Raise (elevate) the swollen body part above the level of your  heart when you are sitting or lying down.  Follow your doctor's instructions about diet and how much fluid you can drink (fluid restriction). This information is not intended to replace advice given to you by your health care provider. Make sure you discuss any questions you have with your health care provider. Document Revised: 05/27/2017 Document Reviewed: 06/11/2016 Elsevier Patient Education  2020 Elsevier Inc.  

## 2019-08-29 NOTE — Progress Notes (Signed)
Subjective:  Patient ID: Nancy Terry, female    DOB: 07-18-1975  Age: 44 y.o. MRN: [id number]  CC: Annual Exam   HPI Nancy Terry Nancy Terry is a 44 year old female with a history of bipolar disorder, anxiety and depression, previous history of opioid abuse (currently on methadone), migraines, insomnia, GERD, sacroiliac joint dysfunction here with the following concerns below.  Her chart states she is here for an annual exam but she recently had a Pap smear at [facility] clinic on 08/14/2018 which was negative for malignancy but positive for high risk HPV.  She had a mammogram which required further evaluation for possible asymmetry in the right breast and she is scheduled for follow-up ultrasound. She was informed at the [facility] clinic to have her legs checked as she could have 'circulation problems'. She has noticed her legs have been swollen, erythematous, with a lot of cramping and this has been present for several months.  Has also noticed dyspnea on exertion.  She endorses high sodium intake.  Completed doxycycline for treatment of hidradenitis at her last visit with Dr. Laural Benes and she is now applying the nystatin powder.  Also waiting dermatology referral. She complains of menorrhagia and she has to change clothes at night because they get stained every time she is on her period and she gets dizzy as well.  Pelvic ultrasound from 2018 revealed: IMPRESSION: 1. Endometrial thickening at 16 mm. If bleeding remains unresponsive to hormonal or medical therapy, focal lesion work-up with sonohysterogram should be considered. Endometrial biopsy should also be considered in pre-menopausal patients at high risk for endometrial carcinoma. (Ref: Radiological Reasoning: Algorithmic Workup of Abnormal Vaginal Bleeding with Endovaginal Sonography and Sonohysterography. AJR 2008; 122:Q82-50)  2. Bilateral ovarian complex cysts. Short-interval follow up ultrasound in 6-12 weeks is recommended,  preferably during the week following the patient's normal menses.   Past Medical History:  Diagnosis Date  . Anxiety   . Depression   . Hepatitis C   . Herpes infection   . Hypertension   . Kidney stones   . Migraines   . Narcotic abuse [facility])     Past Surgical History:  Procedure Laterality Date  . ELBOW SURGERY Left   . KIDNEY STONE SURGERY     with stent placement  . LITHOTRIPSY    . TUBAL LIGATION      Family History  Problem Relation Age of Onset  . Parkinson's disease Mother   . Hypertension Mother   . Rheum arthritis Mother   . Heart failure Mother   . Parkinson's disease Maternal Grandmother   . Hypertension Maternal Grandmother   . Glaucoma Maternal Grandmother   . Lung cancer Maternal Grandmother     Allergies  Allergen Reactions  . Haldol [Haloperidol Decanoate] Other (See Comments)    Hallucinations  . Nitrates, Organic Other (See Comments)    headaches    Outpatient Medications Prior to Visit  Medication Sig Dispense Refill  . methadone (DOLOPHINE) 10 MG/5ML solution Take 120 mg by mouth every other day.     . promethazine (PHENERGAN) 25 MG tablet TAKE 1 TABLET (25 MG TOTAL) BY MOUTH EVERY 8 (EIGHT) HOURS AS NEEDED FOR NAUSEA OR VOMITING. 30 tablet 1  . ibuprofen (ADVIL) 200 MG tablet Take 200 mg by mouth every 6 (six) hours as needed.    . Ledipasvir-Sofosbuvir (HARVONI) 90-400 MG TABS Take 1 tablet by mouth daily. (Patient not taking: Reported on 07/20/2019) 28 tablet 1   No facility-administered medications prior to visit.  ROS Review of Systems  Constitutional: Negative for activity change, appetite change and fatigue.  HENT: Negative for congestion, sinus pressure and sore throat.   Eyes: Negative for visual disturbance.  Respiratory: Positive for shortness of breath. Negative for cough, chest tightness and wheezing.   Cardiovascular: Positive for leg swelling. Negative for chest pain and palpitations.  Gastrointestinal: Negative for  abdominal distention, abdominal pain and constipation.  Endocrine: Negative for polydipsia.  Genitourinary: Negative for dysuria and frequency.  Musculoskeletal: Negative for arthralgias and back pain.  Skin: Positive for rash.  Neurological: Negative for tremors, light-headedness and numbness.  Hematological: Does not bruise/bleed easily.  Psychiatric/Behavioral: Negative for agitation and behavioral problems.    Objective:  BP 104/65   Pulse 60   Ht 5\' 4"  (1.626 m)   Wt 236 lb (107 kg)   LMP 07/31/2019 (Exact Date)   SpO2 96%   BMI 40.51 kg/m   BP/Weight 08/29/2019 1/75/1025 01/09/2777  Systolic BP 242 353 614  Diastolic BP 65 64 70  Wt. (Lbs) 236 237.13 235  BMI 40.51 41.67 40.34      Physical Exam Constitutional:      Appearance: She is well-developed. She is obese.  Neck:     Vascular: No JVD.  Cardiovascular:     Rate and Rhythm: Normal rate.     Pulses: Normal pulses.     Heart sounds: Normal heart sounds. No murmur.  Pulmonary:     Effort: Pulmonary effort is normal.     Breath sounds: Normal breath sounds. No wheezing or rales.  Chest:     Chest wall: No tenderness.  Abdominal:     General: Bowel sounds are normal. There is no distension.     Palpations: Abdomen is soft. There is no mass.     Tenderness: There is no abdominal tenderness.  Musculoskeletal:        General: Normal range of motion.     Right lower leg: Edema (1+) present.     Left lower leg: Edema (1+) present.  Skin:    Findings: Erythema (right shin) present.  Neurological:     Mental Status: She is alert and oriented to person, place, and time.  Psychiatric:        Mood and Affect: Mood normal.     CMP Latest Ref Rng & Units 01/03/2019 12/10/2018 08/26/2018  Glucose 70 - 99 mg/dL - 120(H) 101(H)  BUN 6 - 20 mg/dL - 11 11  Creatinine 0.44 - 1.00 mg/dL - 0.78 0.82  Sodium 135 - 145 mmol/L - 138 137  Potassium 3.5 - 5.1 mmol/L - 3.4(L) 3.7  Chloride 98 - 111 mmol/L - 103 107  CO2 22 -  32 mmol/L - 24 25  Calcium 8.9 - 10.3 mg/dL - 9.4 9.2  Total Protein 6.5 - 8.1 g/dL - 8.8(H) 7.4  Total Bilirubin 0.3 - 1.2 mg/dL - 0.7 0.5  Alkaline Phos 38 - 126 U/L - 85 73  AST 15 - 41 U/L - 29 58(H)  ALT 6 - 29 U/L 41(H) 27 69(H)    Lipid Panel     Component Value Date/Time   CHOL 102 03/22/2017 0947   TRIG 130 03/22/2017 0947   HDL 24 (L) 03/22/2017 0947   CHOLHDL 4.3 03/22/2017 0947   CHOLHDL 4.9 06/14/2014 0851   VLDL 31 06/14/2014 0851   LDLCALC 52 03/22/2017 0947    CBC    Component Value Date/Time   WBC 9.5 12/10/2018 1218   RBC 5.49 (H)  12/10/2018 1218   HGB 13.3 12/10/2018 1218   HGB 12.5 03/27/2018 1442   HCT 44.2 12/10/2018 1218   HCT 37.5 03/27/2018 1442   PLT 369 12/10/2018 1218   PLT 362 03/27/2018 1442   MCV 80.5 12/10/2018 1218   MCV 81 03/27/2018 1442   MCH 24.2 (L) 12/10/2018 1218   MCHC 30.1 12/10/2018 1218   RDW 15.5 12/10/2018 1218   RDW 14.3 03/27/2018 1442   LYMPHSABS 1.1 12/10/2018 1218   LYMPHSABS 2.8 03/27/2018 1442   MONOABS 0.4 12/10/2018 1218   EOSABS 0.0 12/10/2018 1218   EOSABS 0.3 03/27/2018 1442   BASOSABS 0.0 12/10/2018 1218   BASOSABS 0.1 03/27/2018 1442    Lab Results  Component Value Date   HGBA1C 5.3 06/14/2014    Assessment & Plan:   1. Menorrhagia with regular cycle - CBC with Differential/Platelet - Ambulatory referral to Gynecology  2. Venous stasis dermatitis of right lower extremity Advised to use compression stockings, elevate leg, low-sodium diet  3. Hidradenitis Acute flare resolving Awaiting dermatology appointment   4. Pedal edema See #2 above - Basic Metabolic Panel - Brain natriuretic peptide  5. Morbid obesity (HCC) She will benefit from weight loss Counseled on reducing portion sizes, avoiding late meals  Return in about 3 months (around 11/29/2019) for chronic disease management.      Hoy Register, MD, FAAFP. [facility] [place],  [place] [phone]   08/29/2019, 10:43 AM

## 2019-08-29 NOTE — Telephone Encounter (Signed)
Left message on voicemail requesting patient to return call to office. (regarding lab results).

## 2019-08-29 NOTE — Progress Notes (Signed)
Patient has had pap smear and mammogram already done.  States that she was informed to get her legs checked out

## 2019-08-30 ENCOUNTER — Telehealth: Payer: Self-pay

## 2019-08-30 LAB — BASIC METABOLIC PANEL
BUN/Creatinine Ratio: 8 — ABNORMAL LOW (ref 9–23)
BUN: 7 mg/dL (ref 6–24)
CO2: 23 mmol/L (ref 20–29)
Calcium: 9.2 mg/dL (ref 8.7–10.2)
Chloride: 104 mmol/L (ref 96–106)
Creatinine, Ser: 0.88 mg/dL (ref 0.57–1.00)
GFR calc Af Amer: 92 mL/min/{1.73_m2} (ref >59)
GFR calc non Af Amer: 80 mL/min/{1.73_m2} (ref >59)
Glucose: 74 mg/dL (ref 65–99)
Potassium: 4.2 mmol/L (ref 3.5–5.2)
Sodium: 142 mmol/L (ref 134–144)

## 2019-08-30 LAB — CBC WITH DIFFERENTIAL/PLATELET
Basophils Absolute: 0.1 10*3/uL (ref 0.0–0.2)
Basos: 1 %
EOS (ABSOLUTE): 0.3 10*3/uL (ref 0.0–0.4)
Eos: 4 %
Hematocrit: 37.2 % (ref 34.0–46.6)
Hemoglobin: 11.8 g/dL (ref 11.1–15.9)
Immature Grans (Abs): 0.1 10*3/uL (ref 0.0–0.1)
Immature Granulocytes: 1 %
Lymphocytes Absolute: 2.3 10*3/uL (ref 0.7–3.1)
Lymphs: 27 %
MCH: 24.6 pg — ABNORMAL LOW (ref 26.6–33.0)
MCHC: 31.7 g/dL (ref 31.5–35.7)
MCV: 78 fL — ABNORMAL LOW (ref 79–97)
Monocytes Absolute: 0.6 10*3/uL (ref 0.1–0.9)
Monocytes: 8 %
Neutrophils Absolute: 5 10*3/uL (ref 1.4–7.0)
Neutrophils: 59 %
Platelets: 351 10*3/uL (ref 150–450)
RBC: 4.8 x10E6/uL (ref 3.77–5.28)
RDW: 15.6 % — ABNORMAL HIGH (ref 11.7–15.4)
WBC: 8.3 10*3/uL (ref 3.4–10.8)

## 2019-08-30 LAB — BRAIN NATRIURETIC PEPTIDE: BNP: 15.6 pg/mL (ref 0.0–100.0)

## 2019-08-30 NOTE — Telephone Encounter (Signed)
Patient informed Pap results, Pap is negative, HPV-high risk for HPV, needs colposcopy. Patient verbalized understanding, states she is also have a breast biopsy tomorrow.

## 2019-08-31 ENCOUNTER — Telehealth: Payer: Self-pay

## 2019-08-31 NOTE — Telephone Encounter (Signed)
Left message on voicemail requesting patient to return call to office. (I attempted to contact patient to provide Colposcopy appointment information.)

## 2019-09-03 ENCOUNTER — Telehealth: Payer: Self-pay

## 2019-09-03 NOTE — Telephone Encounter (Signed)
-----   Message from Hoy Register, MD sent at 08/30/2019 10:08 PM EDT ----- Please inform the patient that labs are normal. Thank you.

## 2019-09-03 NOTE — Telephone Encounter (Signed)
Patient name and DOB has been verified Patient was informed of lab results. Patient had no questions.  

## 2019-09-20 ENCOUNTER — Ambulatory Visit (INDEPENDENT_AMBULATORY_CARE_PROVIDER_SITE_OTHER): Payer: Self-pay

## 2019-09-20 ENCOUNTER — Other Ambulatory Visit: Payer: Self-pay | Admitting: Internal Medicine

## 2019-09-20 DIAGNOSIS — Z1159 Encounter for screening for other viral diseases: Secondary | ICD-10-CM

## 2019-09-21 LAB — SARS CORONAVIRUS 2 (TAT 6-24 HRS): SARS Coronavirus 2: NEGATIVE

## 2019-09-25 ENCOUNTER — Other Ambulatory Visit: Payer: Self-pay

## 2019-09-25 ENCOUNTER — Ambulatory Visit (AMBULATORY_SURGERY_CENTER): Payer: Self-pay | Admitting: Internal Medicine

## 2019-09-25 ENCOUNTER — Encounter: Payer: Self-pay | Admitting: Internal Medicine

## 2019-09-25 VITALS — BP 104/54 | HR 62 | Temp 97.5°F | Resp 10 | Ht 64.0 in | Wt 236.0 lb

## 2019-09-25 DIAGNOSIS — R112 Nausea with vomiting, unspecified: Secondary | ICD-10-CM

## 2019-09-25 DIAGNOSIS — K449 Diaphragmatic hernia without obstruction or gangrene: Secondary | ICD-10-CM

## 2019-09-25 MED ORDER — SODIUM CHLORIDE 0.9 % IV SOLN: 500.0000 mL | Freq: Once | INTRAVENOUS | Status: DC

## 2019-09-25 NOTE — Progress Notes (Signed)
CW- vials JB- temp

## 2019-09-25 NOTE — Op Note (Addendum)
[facility] Patient Name: Darryl Blumenstein Procedure Date: 09/25/2019 9:37 AM MRN: [id number] Endoscopist: Iva Boop , MD Age: 43 Referring MD:  Date of Birth: 1976/05/21 Gender: Female Account #: [id number] Procedure:                Upper GI endoscopy Indications:              Nausea with vomiting Medicines:                Propofol per Anesthesia, Monitored Anesthesia Care Procedure:                Pre-Anesthesia Assessment:                           - Prior to the procedure, a History and Physical                            was performed, and patient medications and                            allergies were reviewed. The patient's tolerance of                            previous anesthesia was also reviewed. The risks                            and benefits of the procedure and the sedation                            options and risks were discussed with the patient.                            All questions were answered, and informed consent                            was obtained. Prior Anticoagulants: The patient has                            taken no previous anticoagulant or antiplatelet                            agents. ASA Grade Assessment: II - A patient with                            mild systemic disease. After reviewing the risks                            and benefits, the patient was deemed in                            satisfactory condition to undergo the procedure.                           After obtaining informed consent, the endoscope was  passed under direct vision. Throughout the                            procedure, the patient's blood pressure, pulse, and                            oxygen saturations were monitored continuously. The                            Endoscope was introduced through the mouth, and                            advanced to the second part of duodenum. The upper                            GI  endoscopy was accomplished without difficulty.                            The patient tolerated the procedure well. Scope In: Scope Out: Findings:                 A small hiatal hernia was present.                           The exam was otherwise without abnormality.                           The gastroesophageal flap valve was visualized                            endoscopically and classified as Hill Grade IV (no                            fold, wide open lumen, hiatal hernia present). Complications:            No immediate complications. Estimated Blood Loss:     Estimated blood loss: none. Impression:               - Small hiatal hernia.                           - The examination was otherwise normal.                           - Gastroesophageal flap valve classified as Hill                            Grade IV (no fold, wide open lumen, hiatal hernia                            present).                           - No specimens collected. Recommendation:           - Patient has a contact number available for  emergencies. The signs and symptoms of potential                            delayed complications were discussed with the                            patient. Return to normal activities tomorrow.                            Written discharge instructions were provided to the                            patient.                           - Resume previous diet.                           - Continue present medications.                           - RETURN TO PCP AND HAVE FURTHER EVALUATION - WOULD                            CONSIDER FASTING AM CORTISOL LEVEL, CORT STIM TEST                            ? CNS EVALUATION (HAS SOME TINNITUS)                           SHE HAS AM NAUSEA AND VOMITING AND HAS GAINED 80#                            SHE REPORTS                           NO OBSTRUCTION - DO NOT THINK GI CAUSE OF PROBLEMS.                             WOULD NOT PURSUE GASTRIC EMPTYING STUDY AS DOES NOT                            VOMIT AFTER MEALS. SHE IS NOT A FREQUENT [employer]IJ[employer]                            USE SO DO NOT THINK HYPEREMESIS FROM THAT. POSSIBLE                            THERE COULD BE A LINK WITH BREAST MASS ?? Gatha Mayer, MD 09/25/2019 10:29:34 AM This report has been signed electronically.

## 2019-09-25 NOTE — Patient Instructions (Addendum)
The esophagus, stomach and upper intestine all look ok. No cause for nausea and vomiting seen.  I think you need to return to primary care to have some blood tests checked and maybe a CT scan.  I appreciate the opportunity to care for you. Iva Boop, MD, FACG   YOU HAD AN ENDOSCOPIC PROCEDURE TODAY AT THE [facility]:   Refer to the procedure report that was given to you for any specific questions about what was found during the examination.  If the procedure report does not answer your questions, please call your gastroenterologist to clarify.  If you requested that your care partner not be given the details of your procedure findings, then the procedure report has been included in a sealed envelope for you to review at your convenience later.  YOU SHOULD EXPECT: Some feelings of bloating in the abdomen. Passage of more gas than usual.  Walking can help get rid of the air that was put into your GI tract during the procedure and reduce the bloating.  Please Note:  You might notice some irritation and congestion in your nose or some drainage.  This is from the oxygen used during your procedure.  There is no need for concern and it should clear up in a day or so.  SYMPTOMS TO REPORT IMMEDIATELY:    Following upper endoscopy (EGD)  Vomiting of blood or coffee ground material  New chest pain or pain under the shoulder blades  Painful or persistently difficult swallowing  New shortness of breath  Fever of 100F or higher  Black, tarry-looking stools  For urgent or emergent issues, a gastroenterologist can be reached at any hour by calling [phone]. Do not use MyChart messaging for urgent concerns.    DIET:  We do recommend a small meal at first, but then you may proceed to your regular diet.  Drink plenty of fluids but you should avoid alcoholic beverages for 24 hours.  ACTIVITY:  You should plan to take it easy for the rest of today and you should NOT DRIVE or  use heavy machinery until tomorrow (because of the sedation medicines used during the test).    FOLLOW UP: Our staff will call the number listed on your records 48-72 hours following your procedure to check on you and address any questions or concerns that you may have regarding the information given to you following your procedure. If we do not reach you, we will leave a message.  We will attempt to reach you two times.  During this call, we will ask if you have developed any symptoms of COVID 19. If you develop any symptoms (ie: fever, flu-like symptoms, shortness of breath, cough etc.) before then, please call [phone].  If you test positive for Covid 19 in the 2 weeks post procedure, please call and report this information to [place].      SIGNATURES/CONFIDENTIALITY: You and/or your care partner have signed paperwork which will be entered into your electronic medical record.  These signatures attest to the fact that that the information above on your After Visit Summary has been reviewed and is understood.  Full responsibility of the confidentiality of this discharge information lies with you and/or your care-partner.

## 2019-09-25 NOTE — Progress Notes (Signed)
pt tolerated well. VSS. awake and to recovery. Report given to RN. Oral bite block placed and removed with ease. Atraumatic. 

## 2019-09-27 ENCOUNTER — Telehealth: Payer: Self-pay

## 2019-09-27 NOTE — Telephone Encounter (Signed)
  Follow up Call-  Call back number 09/25/2019  Post procedure Call Back phone  # [phone]  Permission to leave phone message Yes  Some recent data might be hidden     Patient questions:  Do you have a fever, pain , or abdominal swelling? No. Pain Score  0 *  Have you tolerated food without any problems? Yes.    Have you been able to return to your normal activities? Yes.    Do you have any questions about your discharge instructions: Diet   No. Medications  No. Follow up visit  No.  Do you have questions or concerns about your Care? No.  Actions: * If pain score is 4 or above: No action needed, pain <4.    1. Have you developed a fever since your procedure? no  2.   Have you had an respiratory symptoms (SOB or cough) since your procedure? no  3.   Have you tested positive for COVID 19 since your procedure no  4.   Have you had any family members/close contacts diagnosed with the COVID 19 since your procedure?  no   If yes to any of these questions please route to Laverna Peace, RN and Charlett Lango, RN

## 2019-10-02 ENCOUNTER — Telehealth: Payer: Self-pay | Admitting: *Deleted

## 2019-10-02 NOTE — Telephone Encounter (Signed)
  Follow up Call-  Call back number 09/25/2019  Post procedure Call Back phone  # [phone]  Permission to leave phone message Yes  Some recent data might be hidden     Patient questions:  Do you have a fever, pain , or abdominal swelling? No. Pain Score  0 *  Have you tolerated food without any problems? Yes.    Have you been able to return to your normal activities? Yes.    Do you have any questions about your discharge instructions: Diet   No. Medications  No. Follow up visit  No.  Do you have questions or concerns about your Care? No.  Actions: * If pain score is 4 or above: 1. No action needed, pain <4.Have you developed a fever since your procedure? no  2.   Have you had an respiratory symptoms (SOB or cough) since your procedure? no  3.   Have you tested positive for COVID 19 since your procedure no  4.   Have you had any family members/close contacts diagnosed with the COVID 19 since your procedure?  no   If yes to any of these questions please route to Laverna Peace, RN and Charlett Lango, RN

## 2019-10-08 ENCOUNTER — Ambulatory Visit
Admission: RE | Admit: 2019-10-08 | Discharge: 2019-10-08 | Disposition: A | Discharge status: Home / Self Care | Payer: No Typology Code available for payment source | Source: Ambulatory Visit | Attending: Obstetrics and Gynecology | Admitting: Obstetrics and Gynecology

## 2019-10-08 ENCOUNTER — Other Ambulatory Visit: Payer: Self-pay

## 2019-10-08 ENCOUNTER — Ambulatory Visit
Admission: RE | Admit: 2019-10-08 | Discharge: 2019-10-08 | Disposition: A | Discharge status: Home / Self Care | Payer: Medicaid Other | Source: Ambulatory Visit | Attending: Obstetrics and Gynecology | Admitting: Obstetrics and Gynecology

## 2019-10-08 DIAGNOSIS — N6489 Other specified disorders of breast: Secondary | ICD-10-CM

## 2019-10-09 ENCOUNTER — Emergency Department (HOSPITAL_BASED_OUTPATIENT_CLINIC_OR_DEPARTMENT_OTHER)
Admission: EM | Admit: 2019-10-09 | Discharge: 2019-10-09 | Disposition: A | Discharge status: Home / Self Care | Payer: Medicaid Other | Attending: Emergency Medicine | Admitting: Emergency Medicine

## 2019-10-09 ENCOUNTER — Encounter (HOSPITAL_BASED_OUTPATIENT_CLINIC_OR_DEPARTMENT_OTHER): Payer: Self-pay | Admitting: Emergency Medicine

## 2019-10-09 ENCOUNTER — Other Ambulatory Visit: Payer: Self-pay

## 2019-10-09 DIAGNOSIS — I10 Essential (primary) hypertension: Secondary | ICD-10-CM | POA: Insufficient documentation

## 2019-10-09 DIAGNOSIS — R112 Nausea with vomiting, unspecified: Secondary | ICD-10-CM | POA: Insufficient documentation

## 2019-10-09 DIAGNOSIS — F17210 Nicotine dependence, cigarettes, uncomplicated: Secondary | ICD-10-CM | POA: Insufficient documentation

## 2019-10-09 LAB — COMPREHENSIVE METABOLIC PANEL
ALT: 31 U/L (ref 0–44)
AST: 23 U/L (ref 15–41)
Albumin: 3.8 g/dL (ref 3.5–5.0)
Alkaline Phosphatase: 76 U/L (ref 38–126)
Anion gap: 9 (ref 5–15)
BUN: 7 mg/dL (ref 6–20)
CO2: 27 mmol/L (ref 22–32)
Calcium: 8.8 mg/dL — ABNORMAL LOW (ref 8.9–10.3)
Chloride: 105 mmol/L (ref 98–111)
Creatinine, Ser: 0.66 mg/dL (ref 0.44–1.00)
GFR calc Af Amer: >60 mL/min (ref >60)
GFR calc non Af Amer: >60 mL/min (ref >60)
Glucose, Bld: 127 mg/dL — ABNORMAL HIGH (ref 70–99)
Potassium: 3.5 mmol/L (ref 3.5–5.1)
Sodium: 141 mmol/L (ref 135–145)
Total Bilirubin: 0.2 mg/dL — ABNORMAL LOW (ref 0.3–1.2)
Total Protein: 7.6 g/dL (ref 6.5–8.1)

## 2019-10-09 LAB — CBC WITH DIFFERENTIAL/PLATELET
Abs Immature Granulocytes: 0.13 10*3/uL — ABNORMAL HIGH (ref 0.00–0.07)
Basophils Absolute: 0.1 10*3/uL (ref 0.0–0.1)
Basophils Relative: 1 %
Eosinophils Absolute: 0.1 10*3/uL (ref 0.0–0.5)
Eosinophils Relative: 1 %
HCT: 37.5 % (ref 36.0–46.0)
Hemoglobin: 11.4 g/dL — ABNORMAL LOW (ref 12.0–15.0)
Immature Granulocytes: 1 %
Lymphocytes Relative: 13 %
Lymphs Abs: 1.3 10*3/uL (ref 0.7–4.0)
MCH: 23.8 pg — ABNORMAL LOW (ref 26.0–34.0)
MCHC: 30.4 g/dL (ref 30.0–36.0)
MCV: 78.5 fL — ABNORMAL LOW (ref 80.0–100.0)
Monocytes Absolute: 0.6 10*3/uL (ref 0.1–1.0)
Monocytes Relative: 6 %
Neutro Abs: 7.6 10*3/uL (ref 1.7–7.7)
Neutrophils Relative %: 78 %
Platelets: 354 10*3/uL (ref 150–400)
RBC: 4.78 MIL/uL (ref 3.87–5.11)
RDW: 16.1 % — ABNORMAL HIGH (ref 11.5–15.5)
WBC: 9.8 10*3/uL (ref 4.0–10.5)
nRBC: 0 % (ref 0.0–0.2)

## 2019-10-09 LAB — URINALYSIS, ROUTINE W REFLEX MICROSCOPIC
Bilirubin Urine: NEGATIVE
Glucose, UA: NEGATIVE mg/dL
Hgb urine dipstick: NEGATIVE
Ketones, ur: NEGATIVE mg/dL
Leukocytes,Ua: NEGATIVE
Nitrite: NEGATIVE
Protein, ur: NEGATIVE mg/dL
Specific Gravity, Urine: 1.015 (ref 1.005–1.030)
pH: >9 — ABNORMAL HIGH (ref 5.0–8.0)

## 2019-10-09 LAB — PREGNANCY, URINE: Preg Test, Ur: NEGATIVE

## 2019-10-09 LAB — LIPASE, BLOOD: Lipase: 22 U/L (ref 11–51)

## 2019-10-09 MED ORDER — SODIUM CHLORIDE 0.9 % IV BOLUS
1000.0000 mL | Freq: Once | INTRAVENOUS | Status: AC
  Administered 2019-10-09: 08:00:00 1000 mL via INTRAVENOUS

## 2019-10-09 MED ORDER — ONDANSETRON HCL 4 MG PO TABS
4.0000 mg | ORAL_TABLET | Freq: Three times a day (TID) | ORAL | 0 refills | Status: DC | PRN
Start: 2019-10-09 — End: 2020-04-02

## 2019-10-09 MED ORDER — ONDANSETRON HCL 4 MG/2ML IJ SOLN
4.0000 mg | Freq: Once | INTRAMUSCULAR | Status: AC
  Administered 2019-10-09: 08:00:00 4 mg via INTRAVENOUS
  Filled 2019-10-09: qty 2

## 2019-10-09 MED FILL — ONDANSETRON HCL 4 MG TABLET: 4 | 7 days supply | Qty: 20 | Fill #0

## 2019-10-09 NOTE — ED Provider Notes (Signed)
[facility] EMERGENCY DEPARTMENT Provider Note   CSN: [id number] Arrival date & time: 10/09/19  2703     History Chief Complaint  Patient presents with  . Emesis    Nancy Terry is a 44 y.o. female.  She is here with a complaint of worsening nausea and vomiting over the past few weeks.  It sounds like it is on a longstanding issue and she has been seen by GI and had a recent endoscopy.  She uses Phenergan as needed.  She vomits frequently in the morning and can sometimes last a few hours.  She feels worn out from all the vomiting she is doing.  She had a breast biopsy yesterday under local anesthesia.  She is complaining some generalized abdominal pain, that she thinks is due to her vomiting.  No weight loss.  No known fevers.  When she vomits she feels sweaty and chilled afterwards  The history is provided by the patient.  Emesis Severity:  Severe Timing:  Intermittent Quality:  Stomach contents Progression:  Unchanged Chronicity:  Chronic Recent urination:  Normal Relieved by:  Nothing Worsened by:  Nothing Ineffective treatments:  Antiemetics Associated symptoms: abdominal pain, chills and cough   Associated symptoms: no diarrhea, no headaches and no sore throat        Past Medical History:  Diagnosis Date  . Anxiety   . Depression   . Hepatitis C   . Herpes infection   . Hypertension   . Kidney stones   . Migraines   . Narcotic abuse [facility])     Patient Active Problem List   Diagnosis Date Noted  . Chronic pain syndrome 04/11/2018  . Complex ovarian cyst 11/21/2017  . Hypertension 11/21/2017  . Menorrhagia 05/13/2017  . Sciatica 05/13/2017  . Migraines 05/07/2016  . Hepatitis C 05/07/2016  . Depression 04/05/2016  . Anxiety and depression 04/05/2016  . Narcotic abuse (H[facility]) 04/05/2016  . Breast lump on right side at 10 o'clock position 03/12/2014  . Pap smear of cervix shows high risk HPV present 03/12/2014    Past Surgical History:  Procedure  Laterality Date  . BREAST BIOPSY    . ELBOW SURGERY Left   . KIDNEY STONE SURGERY     with stent placement  . LITHOTRIPSY    . TUBAL LIGATION       OB History    Gravida  3   Para  3   Term  3   Preterm      AB      Living  3     SAB      TAB      Ectopic      Multiple      Live Births              Family History  Problem Relation Age of Onset  . Parkinson's disease Mother   . Hypertension Mother   . Rheum arthritis Mother   . Heart failure Mother   . Parkinson's disease Maternal Grandmother   . Hypertension Maternal Grandmother   . Glaucoma Maternal Grandmother   . Lung cancer Maternal Grandmother     Social History   Tobacco Use  . Smoking status: Current Every Day Smoker    Packs/day: 1.00    Years: 0.50    Pack years: 0.50    Types: Cigarettes  . Smokeless tobacco: Never Used  Substance Use Topics  . Alcohol use: No  . Drug use: Not Currently  Types: Heroin, Marijuana    Comment:  smoked marijuana 4 days ago.  hasn't used heroin since 06/01/2014    Home Medications Prior to Admission medications   Medication Sig Start Date End Date Taking? Authorizing Provider  ibuprofen (ADVIL) 200 MG tablet Take 200 mg by mouth every 6 (six) hours as needed.    [provider]  Ledipasvir-Sofosbuvir (HARVONI) 90-400 MG TABS Take 1 tablet by mouth daily. Patient not taking: Reported on 07/20/2019 02/28/19   Powers, Evern Core, MD  methadone (DOLOPHINE) 10 MG/5ML solution Take 120 mg by mouth every other day.     [provider]  promethazine (PHENERGAN) 25 MG tablet TAKE 1 TABLET (25 MG TOTAL) BY MOUTH EVERY 8 (EIGHT) HOURS AS NEEDED FOR NAUSEA OR VOMITING. Patient not taking: Reported on 09/25/2019 07/04/19   Charlott Rakes, MD    Allergies    Haldol [haloperidol decanoate] and Nitrates, organic  Review of Systems   Review of Systems  Constitutional: Positive for chills and diaphoresis.  HENT: Negative for sore throat.   Eyes:  Negative for visual disturbance.  Respiratory: Positive for cough. Negative for shortness of breath.   Cardiovascular: Negative for chest pain.  Gastrointestinal: Positive for abdominal pain and vomiting. Negative for diarrhea.  Genitourinary: Negative for dysuria.  Musculoskeletal: Negative for gait problem.  Skin: Negative for rash.  Neurological: Negative for headaches.    Physical Exam Updated Vital Signs BP (!) 160/71 (BP Location: Right Arm)   Pulse 65   Temp 97.7 F (36.5 C) (Oral)   Resp 20   Ht 5\' 4"  (1.626 m)   Wt 107 kg   LMP 09/30/2019 (Approximate)   SpO2 94%   BMI 40.51 kg/m   Physical Exam Vitals and nursing note reviewed.  Constitutional:      General: She is not in acute distress.    Appearance: Normal appearance. She is well-developed.  HENT:     Head: Normocephalic and atraumatic.  Eyes:     Conjunctiva/sclera: Conjunctivae normal.  Cardiovascular:     Rate and Rhythm: Normal rate and regular rhythm.     Heart sounds: No murmur.  Pulmonary:     Effort: Pulmonary effort is normal. No respiratory distress.     Breath sounds: Normal breath sounds.  Abdominal:     Palpations: Abdomen is soft.     Tenderness: There is no abdominal tenderness. There is no guarding or rebound.  Musculoskeletal:        General: No deformity or signs of injury. Normal range of motion.     Cervical back: Neck supple.  Skin:    General: Skin is warm and dry.     Capillary Refill: Capillary refill takes less than 2 seconds.  Neurological:     General: No focal deficit present.     Mental Status: She is alert.     ED Results / Procedures / Treatments   Labs (all labs ordered are listed, but only abnormal results are displayed) Labs Reviewed  COMPREHENSIVE METABOLIC PANEL - Abnormal; Notable for the following components:      Result Value   Glucose, Bld 127 (*)    Calcium 8.8 (*)    Total Bilirubin 0.2 (*)    All other components within normal limits  CBC WITH  DIFFERENTIAL/PLATELET - Abnormal; Notable for the following components:   Hemoglobin 11.4 (*)    MCV 78.5 (*)    MCH 23.8 (*)    RDW 16.1 (*)    Abs Immature Granulocytes  0.13 (*)    All other components within normal limits  URINALYSIS, ROUTINE W REFLEX MICROSCOPIC - Abnormal; Notable for the following components:   pH >9.0 (*)    All other components within normal limits  LIPASE, BLOOD  PREGNANCY, URINE    EKG None  Radiology MM CLIP PLACEMENT RIGHT  Result Date: 10/08/2019 CLINICAL DATA:  Status post stereotactic guided core needle biopsy of a 9 mm mass in the outer right breast. EXAM: DIAGNOSTIC RIGHT MAMMOGRAM POST STEREOTACTIC BIOPSY COMPARISON:  Previous exam(s). FINDINGS: Mammographic images were obtained following stereotactic guided biopsy of the recently demonstrated 9 mm mass in the outer right breast. The biopsy marking clip is in expected position at the site of biopsy. IMPRESSION: Appropriate positioning of the coil shaped biopsy marking clip at the site of biopsy in the lower outer right breast. Final Assessment: Post Procedure Mammograms for Marker Placement Electronically Signed   By: Beckie Salts M.D.   On: 10/08/2019 10:15   MM RT BREAST BX W LOC DEV 1ST LESION IMAGE BX SPEC STEREO GUIDE  Result Date: 10/08/2019 CLINICAL DATA:  9 mm mass in the outer right breast at recent mammography, not well seen sonographically. EXAM: RIGHT BREAST STEREOTACTIC CORE NEEDLE BIOPSY COMPARISON:  Previous exams. FINDINGS: The patient and I discussed the procedure of stereotactic-guided biopsy including benefits and alternatives. We discussed the high likelihood of a successful procedure. We discussed the risks of the procedure including infection, bleeding, tissue injury, clip migration, and inadequate sampling. Informed written consent was given. The usual time out protocol was performed immediately prior to the procedure. Using sterile technique and 1% Lidocaine as local anesthetic, under  stereotactic guidance, a 9 gauge vacuum assisted device was used to perform core needle biopsy of the recently demonstrated 9 mm oval, circumscribed mass in the outer right breast using a lateral approach. At the conclusion of the procedure, a coil shaped tissue marker clip was deployed into the biopsy cavity. Follow-up 2-view mammogram was performed and dictated separately. IMPRESSION: Stereotactic-guided biopsy of the recently demonstrated 9 mm mass in the lateral right breast. No apparent complications. Electronically Signed   By: Beckie Salts M.D.   On: 10/08/2019 10:07    Procedures Procedures (including critical care time)  Medications Ordered in ED Medications  sodium chloride 0.9 % bolus 1,000 mL ( Intravenous Stopped 10/09/19 0825)  ondansetron (ZOFRAN) injection 4 mg (4 mg Intravenous Given 10/09/19 0744)    ED Course  I have reviewed the triage vital signs and the nursing notes.  Pertinent labs & imaging results that were available during my care of the patient were reviewed by me and considered in my medical decision making (see chart for details).  Clinical Course as of Oct 08 1757  Tue Oct 09, 2019  0762 Endoscopy 09/25/2019 - - Small hiatal hernia. - The examination was otherwise normal. - Gastroesophageal flap valve classified as Hill Grade IV (no fold, wide open lumen, hiatal hernia present). - No specimens collected.   [MB]  0820 Patient's lab work fairly unremarkable.  She has received some IV fluids and Zofran and she says her symptoms are improved.  We discussed her following back up with her GI doctor and I will prescribe her Zofran to trial   [MB]  1757 Creatinine: 0.66 [MB]    Clinical Course User Index [MB] Terrilee Files, MD   MDM Rules/Calculators/A&P  This patient complains of nausea and vomiting; this involves an extensive number of treatment Options and is a complaint that carries with it a high risk of complications and Morbidity.  The differential includes gastritis, obstruction, reflux, hiatal hernia, gastroparesis, metabolic derangement  I ordered, reviewed and interpreted labs, which included CBC which shows a normal white count stable hemoglobin normal chemistries other than mildly elevated glucose of 127, LFTs, normal lipase, negative pregnancy test, clean urine I ordered medication Zofran and IV fluids with improvement in her symptoms Previous records obtained and reviewed in epic including prior GI notes and recent endoscopy not showing any significant findings  After the interventions stated above, I reevaluated the patient and found symptoms to be improved.  I reviewed her work-up with her.  Will provide prescription for Zofran as she felt that her more than the Phenergan she has.  Recommended continue to work on her symptom control with primary care doctor and GI.  Return instructions discussed   Final Clinical Impression(s) / ED Diagnoses Final diagnoses:  Non-intractable vomiting with nausea, unspecified vomiting type    Rx / DC Orders ED Discharge Orders         Ordered    ondansetron (ZOFRAN) 4 MG tablet  Every 8 hours PRN     10/09/19 0822           Terrilee Files, MD 10/09/19 1759

## 2019-10-09 NOTE — ED Notes (Signed)
ED Provider at bedside. 

## 2019-10-09 NOTE — Discharge Instructions (Addendum)
You were seen in the emergency department for worsening nausea and vomiting.  You had blood work that did not show any serious abnormalities.  Your symptoms improved with fluids and nausea medication.  We are prescribing you ondansetron for your nausea.  Please contact your primary care doctor and GI for follow-up.  Return to the emergency department if any worsening or concerning symptoms

## 2019-10-09 NOTE — ED Triage Notes (Signed)
Pt states she had a breast biopsy yesterday and this morning she has been vomiting and having chills  Pt has cough noted  Pt states she has been also going to a gastroenterologist for stomach issues

## 2019-10-10 ENCOUNTER — Encounter: Payer: Self-pay | Admitting: Family Medicine

## 2019-10-10 ENCOUNTER — Ambulatory Visit (INDEPENDENT_AMBULATORY_CARE_PROVIDER_SITE_OTHER): Payer: Self-pay | Admitting: Family Medicine

## 2019-10-10 VITALS — BP 138/66 | HR 53 | Wt 228.4 lb

## 2019-10-10 DIAGNOSIS — N920 Excessive and frequent menstruation with regular cycle: Secondary | ICD-10-CM

## 2019-10-10 DIAGNOSIS — Z720 Tobacco use: Secondary | ICD-10-CM

## 2019-10-10 DIAGNOSIS — R87810 Cervical high risk human papillomavirus (HPV) DNA test positive: Secondary | ICD-10-CM

## 2019-10-10 DIAGNOSIS — L732 Hidradenitis suppurativa: Secondary | ICD-10-CM | POA: Insufficient documentation

## 2019-10-10 NOTE — Assessment & Plan Note (Signed)
Smoking and tobacco cessation was discussed at today's visit for 7 minutes

## 2019-10-10 NOTE — Progress Notes (Signed)
   Subjective:    Patient ID: Nancy Terry is a 44 y.o. female presenting with Menorrhagia  on 10/10/2019  HPI: *REgular cycles and last 4 days but increasingly heavy over the last 5 years.   Review of Systems  Constitutional: Negative for chills and fever.  Respiratory: Negative for shortness of breath.   Cardiovascular: Negative for chest pain.  Gastrointestinal: Negative for abdominal pain, nausea and vomiting.  Genitourinary: Negative for dysuria.  Skin: Negative for rash.      Objective:    BP 138/66   Pulse (!) 53   Wt 228 lb 6.4 oz (103.6 kg)   LMP 09/20/2019 (Approximate)   BMI 39.20 kg/m  Physical Exam Constitutional:      General: She is not in acute distress.    Appearance: She is well-developed.  HENT:     Head: Normocephalic and atraumatic.  Eyes:     General: No scleral icterus. Cardiovascular:     Rate and Rhythm: Normal rate.  Pulmonary:     Effort: Pulmonary effort is normal.  Abdominal:     Palpations: Abdomen is soft.  Musculoskeletal:     Cervical back: Neck supple.  Skin:    General: Skin is warm and dry.  Neurological:     Mental Status: She is alert and oriented to person, place, and time.         Assessment & Plan:   Problem List Items Addressed This Visit      Unprioritized   Pap smear of cervix shows high risk HPV present - Primary    From 08/14/19 with [facility]. Needs f/u pap in 1 year.      Menorrhagia    Given age, needs MB--declined today, will get next visit. Check u/s. Discussed options of IUD or endometrial ablation and hysterectomy with risks. Given Hep C, ablation may make the most sense. She has had BTL previously.      Relevant Orders   US PELVIS TRANSVAGINAL NON-OB (TV ONLY)   Tobacco use    Smoking and tobacco cessation was discussed at today's visit for 7 minutes            Total time: 30 minutes.  Return in about 4 weeks (around 11/07/2019) for for endometrial biopsy, needs U/S.  Reva Bores 10/10/2019 11:53 AM

## 2019-10-10 NOTE — Assessment & Plan Note (Signed)
Given age, needs MB--declined today, will get next visit. Check u/s. Discussed options of IUD or endometrial ablation and hysterectomy with risks. Given Hep C, ablation may make the most sense. She has had BTL previously.

## 2019-10-10 NOTE — Patient Instructions (Addendum)
Steps to Quit Smoking Smoking tobacco is the leading cause of preventable death. It can affect almost every organ in the body. Smoking puts you and people around you at risk for many serious, long-lasting (chronic) diseases. Quitting smoking can be hard, but it is one of the best things that you can do for your health. It is never too late to quit. How do I get ready to quit? When you decide to quit smoking, make a plan to help you succeed. Before you quit:  Pick a date to quit. Set a date within the next 2 weeks to give you time to prepare.  Write down the reasons why you are quitting. Keep this list in places where you will see it often.  Tell your family, friends, and co-workers that you are quitting. Their support is important.  Talk with your doctor about the choices that may help you quit.  Find out if your health insurance will pay for these treatments.  Know the people, places, things, and activities that make you want to smoke (triggers). Avoid them. What first steps can I take to quit smoking?  Throw away all cigarettes at home, at work, and in your car.  Throw away the things that you use when you smoke, such as ashtrays and lighters.  Clean your car. Make sure to empty the ashtray.  Clean your home, including curtains and carpets. What can I do to help me quit smoking? Talk with your doctor about taking medicines and seeing a counselor at the same time. You are more likely to succeed when you do both.  If you are pregnant or breastfeeding, talk with your doctor about counseling or other ways to quit smoking. Do not take medicine to help you quit smoking unless your doctor tells you to do so. To quit smoking: Quit right away  Quit smoking totally, instead of slowly cutting back on how much you smoke over a period of time.  Go to counseling. You are more likely to quit if you go to counseling sessions regularly. Take medicine You may take medicines to help you quit. Some  medicines need a prescription, and some you can buy over-the-counter. Some medicines may contain a drug called nicotine to replace the nicotine in cigarettes. Medicines may:  Help you to stop having the desire to smoke (cravings).  Help to stop the problems that come when you stop smoking (withdrawal symptoms). Your doctor may ask you to use:  Nicotine patches, gum, or lozenges.  Nicotine inhalers or sprays.  Non-nicotine medicine that is taken by mouth. Find resources Find resources and other ways to help you quit smoking and remain smoke-free after you quit. These resources are most helpful when you use them often. They include:  Online chats with a counselor.  Phone quitlines.  Printed self-help materials.  Support groups or group counseling.  Text messaging programs.  Mobile phone apps. Use apps on your mobile phone or tablet that can help you stick to your quit plan. There are many free apps for mobile phones and tablets as well as websites. Examples include Quit Guide from the CDC and smokefree.gov  What things can I do to make it easier to quit?   Talk to your family and friends. Ask them to support and encourage you.  Call a phone quitline (1-800-QUIT-NOW), reach out to support groups, or work with a counselor.  Ask people who smoke to not smoke around you.  Avoid places that make you want to smoke,   such as: ? Bars. ? Parties. ? Smoke-break areas at work.  Spend time with people who do not smoke.  Lower the stress in your life. Stress can make you want to smoke. Try these things to help your stress: ? Getting regular exercise. ? Doing deep-breathing exercises. ? Doing yoga. ? Meditating. ? Doing a body scan. To do this, close your eyes, focus on one area of your body at a time from head to toe. Notice which parts of your body are tense. Try to relax the muscles in those areas. How will I feel when I quit smoking? Day 1 to 3 weeks Within the first 24 hours,  you may start to have some problems that come from quitting tobacco. These problems are very bad 2-3 days after you quit, but they do not often last for more than 2-3 weeks. You may get these symptoms:  Mood swings.  Feeling restless, nervous, angry, or annoyed.  Trouble concentrating.  Dizziness.  Strong desire for high-sugar foods and nicotine.  Weight gain.  Trouble pooping (constipation).  Feeling like you may vomit (nausea).  Coughing or a sore throat.  Changes in how the medicines that you take for other issues work in your body.  Depression.  Trouble sleeping (insomnia). Week 3 and afterward After the first 2-3 weeks of quitting, you may start to notice more positive results, such as:  Better sense of smell and taste.  Less coughing and sore throat.  Slower heart rate.  Lower blood pressure.  Clearer skin.  Better breathing.  Fewer sick days. Quitting smoking can be hard. Do not give up if you fail the first time. Some people need to try a few times before they succeed. Do your best to stick to your quit plan, and talk with your doctor if you have any questions or concerns. Summary  Smoking tobacco is the leading cause of preventable death. Quitting smoking can be hard, but it is one of the best things that you can do for your health.  When you decide to quit smoking, make a plan to help you succeed.  Quit smoking right away, not slowly over a period of time.  When you start quitting, seek help from your doctor, family, or friends. This information is not intended to replace advice given to you by your health care provider. Make sure you discuss any questions you have with your health care provider. Document Revised: 02/16/2019 Document Reviewed: 08/12/2018 Elsevier Patient Education  2020 Elsevier Inc.  

## 2019-10-10 NOTE — Assessment & Plan Note (Signed)
From 08/14/19 with [facility]. Needs f/u pap in 1 year.

## 2019-10-15 ENCOUNTER — Other Ambulatory Visit: Payer: Self-pay | Admitting: Internal Medicine

## 2019-10-15 DIAGNOSIS — L309 Dermatitis, unspecified: Secondary | ICD-10-CM

## 2019-10-18 ENCOUNTER — Ambulatory Visit: Admission: RE | Admit: 2019-10-18 | Payer: Medicaid Other | Source: Ambulatory Visit

## 2019-10-25 ENCOUNTER — Other Ambulatory Visit: Payer: Self-pay

## 2019-10-25 ENCOUNTER — Ambulatory Visit
Admission: RE | Admit: 2019-10-25 | Discharge: 2019-10-25 | Disposition: A | Discharge status: Home / Self Care | Payer: Medicaid Other | Source: Ambulatory Visit | Attending: Family Medicine | Admitting: Family Medicine

## 2019-10-25 DIAGNOSIS — N920 Excessive and frequent menstruation with regular cycle: Secondary | ICD-10-CM | POA: Insufficient documentation

## 2019-11-02 MED FILL — CLEOCIN T 1% LOTION: 1 | 30 days supply | Qty: 60 | Fill #0

## 2019-11-02 MED FILL — MUPIROCIN 2% OINTMENT: 2 | 10 days supply | Qty: 22 | Fill #0

## 2019-11-02 MED FILL — DOXYCYCLINE HYCLATE 100 MG: 100 | 30 days supply | Qty: 60 | Fill #0

## 2019-11-08 ENCOUNTER — Ambulatory Visit: Payer: Medicaid Other | Admitting: Family Medicine

## 2019-11-08 ENCOUNTER — Telehealth: Payer: Self-pay | Admitting: Family Medicine

## 2019-11-08 NOTE — Telephone Encounter (Signed)
Patient called to reschedule her appointment °

## 2019-11-23 ENCOUNTER — Ambulatory Visit: Payer: Medicaid Other | Admitting: Gastroenterology

## 2019-11-28 ENCOUNTER — Encounter: Payer: Self-pay | Admitting: Family Medicine

## 2019-11-28 ENCOUNTER — Ambulatory Visit: Payer: Self-pay | Attending: Family Medicine | Admitting: Family Medicine

## 2019-11-28 ENCOUNTER — Other Ambulatory Visit: Payer: Self-pay

## 2019-11-28 DIAGNOSIS — I872 Venous insufficiency (chronic) (peripheral): Secondary | ICD-10-CM

## 2019-11-28 DIAGNOSIS — H6123 Impacted cerumen, bilateral: Secondary | ICD-10-CM

## 2019-11-28 NOTE — Progress Notes (Signed)
States that she has no circulation in her legs and they are both red.  She will also like a referral to ENT for her ears.

## 2019-11-28 NOTE — Progress Notes (Signed)
Virtual Visit via Telephone Note  I connected with Bonnye Halle, on 11/28/2019 at 3:59 PM by telephone due to the COVID-19 pandemic and verified that I am speaking with the correct person using two identifiers.   Consent: I discussed the limitations, risks, security and privacy concerns of performing an evaluation and management service by telephone and the availability of in person appointments. I also discussed with the patient that there may be a patient responsible charge related to this service. The patient expressed understanding and agreed to proceed.   Location of Patient: Home  Location of Provider: Clinic   Persons participating in Telemedicine visit: Nikoleta Dady Farrington-CMA Dr. Margarita Rana     History of Present Illness: Nancy Terry is a 44 year old female with a history of bipolar disorder, anxiety and depression, previous history of opioid abuse (currently on methadone), migraines, insomnia, GERD, sacroiliac joint dysfunctionhere with the following concerns below.   She complains she has no circulation in her legs.  We had discussed this at her last visit 3 months ago when she did have normal pulses bilaterally but had pedal edema was diagnosed with venous stasis dermatitis.  Advised to obtain compression stockings and elevate feet but she does not recall any of this discussion. She would like to see ENT as she feels she has a lot of ear wax buildup  For her menorrhagia she was referred to GYN and will be undergoing an endometrial biopsy    Past Medical History:  Diagnosis Date  . Anxiety   . Depression   . Hepatitis C   . Herpes infection   . Hypertension   . Kidney stones   . Migraines   . Narcotic abuse (HCC)    Allergies  Allergen Reactions  . Haldol [Haloperidol Decanoate] Other (See Comments)    Hallucinations  . Nitrates, Organic Other (See Comments)    headaches    Current Outpatient Medications on File Prior to Visit  Medication Sig  Dispense Refill  . ibuprofen (ADVIL) 200 MG tablet Take 200 mg by mouth every 6 (six) hours as needed.    . methadone (DOLOPHINE) 10 MG/5ML solution Take 130 mg by mouth every other day.     . ondansetron (ZOFRAN) 4 MG tablet Take 1 tablet (4 mg total) by mouth every 8 (eight) hours as needed for nausea or vomiting. 20 tablet 0  . Ledipasvir-Sofosbuvir (HARVONI) 90-400 MG TABS Take 1 tablet by mouth daily. (Patient not taking: Reported on 07/20/2019) 28 tablet 1  . nystatin (MYCOSTATIN/NYSTOP) powder APPLY TO AFFECTED AREA TWICE A DAY. (Patient not taking: Reported on 11/28/2019) 15 g 0  . promethazine (PHENERGAN) 25 MG tablet TAKE 1 TABLET (25 MG TOTAL) BY MOUTH EVERY 8 (EIGHT) HOURS AS NEEDED FOR NAUSEA OR VOMITING. (Patient not taking: Reported on 09/25/2019) 30 tablet 1   No current facility-administered medications on file prior to visit.    Observations/Objective: Awake, alert, oriented x3 Not in acute distress  Assessment and Plan: 1. Venous stasis dermatitis of right lower extremity No evidence of PVD from last in-person exam Advised to obtain compression stockings Elevate feet, low-sodium diet If symptoms persist consider referral to vascular surgery  2. Bilateral impacted cerumen We will schedule for nurse visit for ear lavage and if symptoms persist consider ENT referral   Follow Up Instructions: Return in about 2 weeks (around 12/12/2019) for ear lavage.    I discussed the assessment and treatment plan with the patient. The patient was provided an opportunity to ask questions  and all were answered. The patient agreed with the plan and demonstrated an understanding of the instructions.   The patient was advised to call back or seek an in-person evaluation if the symptoms worsen or if the condition fails to improve as anticipated.     I provided 13 minutes total of non-face-to-face time during this encounter including median intraservice time, reviewing previous notes,  investigations, ordering medications, medical decision making, coordinating care and patient verbalized understanding at the end of the visit.     Hoy Register, MD, FAAFP. [facility] [place], [place] [phone]   11/28/2019, 3:59 PM

## 2019-12-13 ENCOUNTER — Ambulatory Visit: Payer: Medicaid Other | Admitting: Family Medicine

## 2019-12-13 ENCOUNTER — Encounter: Payer: Self-pay | Admitting: Family Medicine

## 2019-12-13 NOTE — Progress Notes (Signed)
Patient did not keep appointment today. She may call to reschedule.  

## 2019-12-19 ENCOUNTER — Ambulatory Visit: Payer: Medicaid Other

## 2020-01-01 ENCOUNTER — Ambulatory Visit: Payer: Medicaid Other | Admitting: Gastroenterology

## 2020-01-15 ENCOUNTER — Telehealth: Payer: Self-pay | Admitting: *Deleted

## 2020-01-15 NOTE — Telephone Encounter (Signed)
I do not think she ever started... at least that I know of.

## 2020-01-15 NOTE — Telephone Encounter (Signed)
Received medication verification form from [facility] asking for a list of any prescriptions managed by this clinic for continuity of care. Patient seen by Dr Lorenso Courier; Dr Luciana Axe completed the form. RN faxed back to [phone]. Unsure if patient ever started her Harvoni or intends to follow up at [facility]. Will 'cc pharmacy team. Andree Coss, RN

## 2020-01-23 ENCOUNTER — Ambulatory Visit: Payer: Medicaid Other | Admitting: Gastroenterology

## 2020-01-25 ENCOUNTER — Ambulatory Visit (INDEPENDENT_AMBULATORY_CARE_PROVIDER_SITE_OTHER): Payer: Self-pay | Admitting: Internal Medicine

## 2020-01-25 ENCOUNTER — Encounter: Payer: Self-pay | Admitting: Internal Medicine

## 2020-01-25 ENCOUNTER — Other Ambulatory Visit (INDEPENDENT_AMBULATORY_CARE_PROVIDER_SITE_OTHER): Payer: Self-pay

## 2020-01-25 VITALS — BP 100/60 | HR 64 | Ht 64.0 in | Wt 233.1 lb

## 2020-01-25 DIAGNOSIS — D500 Iron deficiency anemia secondary to blood loss (chronic): Secondary | ICD-10-CM

## 2020-01-25 DIAGNOSIS — R112 Nausea with vomiting, unspecified: Secondary | ICD-10-CM

## 2020-01-25 LAB — CBC WITH DIFFERENTIAL/PLATELET
Basophils Absolute: 0.1 10*3/uL (ref 0.0–0.1)
Basophils Relative: 0.6 % (ref 0.0–3.0)
Eosinophils Absolute: 0.4 10*3/uL (ref 0.0–0.7)
Eosinophils Relative: 3.6 % (ref 0.0–5.0)
HCT: 36.6 % (ref 36.0–46.0)
Hemoglobin: 11.5 g/dL — ABNORMAL LOW (ref 12.0–15.0)
Lymphocytes Relative: 26.1 % (ref 12.0–46.0)
Lymphs Abs: 2.8 10*3/uL (ref 0.7–4.0)
MCHC: 31.4 g/dL (ref 30.0–36.0)
MCV: 73.6 fl — ABNORMAL LOW (ref 78.0–100.0)
Monocytes Absolute: 0.9 10*3/uL (ref 0.1–1.0)
Monocytes Relative: 8.2 % (ref 3.0–12.0)
Neutro Abs: 6.6 10*3/uL (ref 1.4–7.7)
Neutrophils Relative %: 61.5 % (ref 43.0–77.0)
Platelets: 338 10*3/uL (ref 150.0–400.0)
RBC: 4.97 Mil/uL (ref 3.87–5.11)
RDW: 17.3 % — ABNORMAL HIGH (ref 11.5–15.5)
WBC: 10.7 10*3/uL — ABNORMAL HIGH (ref 4.0–10.5)

## 2020-01-25 LAB — FERRITIN: Ferritin: 8.4 ng/mL — ABNORMAL LOW (ref 10.0–291.0)

## 2020-01-25 NOTE — Progress Notes (Signed)
Nancy Terry 44 y.o. [birth year] [id number]  Assessment & Plan:   Encounter Diagnoses  Name Primary?  . Iron deficiency anemia due to chronic blood loss Yes  . Nausea and vomiting -severe, resolved spontaneously    Suspect this is due to menorrhagia.  Colonoscopy is appropriate given her age and lack of prior screening and we will arrange this.  The risks and benefits as well as alternatives of endoscopic procedure(s) have been discussed and reviewed. All questions answered. The patient agrees to proceed.  We will contact her and have her start ferrous sulfate 325 mg daily for iron deficiency anemia labs from today as below. Lab Results  Component Value Date   WBC 10.7 (H) 01/25/2020   HGB 11.5 (L) 01/25/2020   HCT 36.6 01/25/2020   MCV 73.6 (L) 01/25/2020   PLT 338.0 01/25/2020   Lab Results  Component Value Date   FERRITIN 8.4 (L) 01/25/2020      Subjective:   Chief Complaint: Prior nausea and vomiting  HPI Nancy Terry is a 44 year old woman with history of chronic opioid dependence on methadone who was here in April with nausea and vomiting in the setting of a weight gain.  She was having terrible nausea vomiting daily but had gained weight.  EGD was negative.  I had recommended she be considered for evaluation of cortisol excess to primary care, she went to the ER the following month and this follow-up is related to that visit but her nausea and vomiting stopped and she did not have any further evaluation.  She does have menorrhagia and a microcytic anemia.  She is seeing GYN in follow-up she had a pelvic ultrasound suggestive of adenomyosis and though she missed a follow-up she has rescheduled 1 with Dr. Shawnie Pons of GYN.  She has not had a colonoscopy before and we had considered setting her up for a screening colonoscopy but with the nausea and vomiting did not think she could tolerate the prep so that had not been done yet. Allergies  Allergen Reactions  . Haldol [Haloperidol  Decanoate] Other (See Comments)    Hallucinations  . Nitrates, Organic Other (See Comments)    headaches   Current Meds  Medication Sig  . ibuprofen (ADVIL) 200 MG tablet Take 200 mg by mouth every 6 (six) hours as needed.  . methadone (DOLOPHINE) 10 MG/5ML solution Take 130 mg by mouth every other day.   . ondansetron (ZOFRAN) 4 MG tablet Take 1 tablet (4 mg total) by mouth every 8 (eight) hours as needed for nausea or vomiting.   Past Medical History:  Diagnosis Date  . Anxiety   . Depression   . Hepatitis C   . Herpes infection   . Hypertension   . Kidney stones   . Migraines   . Narcotic abuse [facility])    Past Surgical History:  Procedure Laterality Date  . BREAST BIOPSY    . ELBOW SURGERY Left   . KIDNEY STONE SURGERY     with stent placement  . LITHOTRIPSY    . TUBAL LIGATION     Social History   Social History Narrative  . Not on file   family history includes Glaucoma in her maternal grandmother; Heart failure in her mother; Hypertension in her maternal grandmother and mother; Lung cancer in her maternal grandmother; Parkinson's disease in her maternal grandmother and mother; Rheum arthritis in her mother.   Review of Systems As per HPI  Objective:   Physical Exam BP 100/60  Pulse 64   Ht 5\' 4"  (1.626 m)   Wt 233 lb 2 oz (105.7 kg)   BMI 40.02 kg/m   Data reviewed includes OB/GYN notes pelvic ultrasound, ER notes labs in the EMR

## 2020-01-25 NOTE — Patient Instructions (Signed)
You have been scheduled for a colonoscopy. Please follow written instructions given to you at your visit today.  Please pick up your prep supplies at the pharmacy within the next 1-3 days. If you use inhalers (even only as needed), please bring them with you on the day of your procedure.   Your provider has requested that you go to the basement level for lab work before leaving today. Press "B" on the elevator. The lab is located at the first door on the left as you exit the elevator.    I appreciate the opportunity to care for you. Carl Gessner, MD, FACG 

## 2020-01-28 ENCOUNTER — Other Ambulatory Visit: Payer: Self-pay | Admitting: Dermatology

## 2020-01-28 MED FILL — AMOXICILLIN 500 MG CAPSULE: 500 | 14 days supply | Qty: 28 | Fill #0

## 2020-01-29 MED FILL — TRIAMCINOLONE 0.5% CREAM: 0.5 | 30 days supply | Qty: 45 | Fill #0

## 2020-02-07 IMAGING — DX DG LUMBAR SPINE 2-3V
3 series · 3 of 3 positions shown · non-contrast
Comparison: CT 11/06/2017.

CLINICAL DATA: Chronic back pain.  Right sciatica.

EXAM:
LUMBAR SPINE - 2-3 VIEW

[l-spine ap]
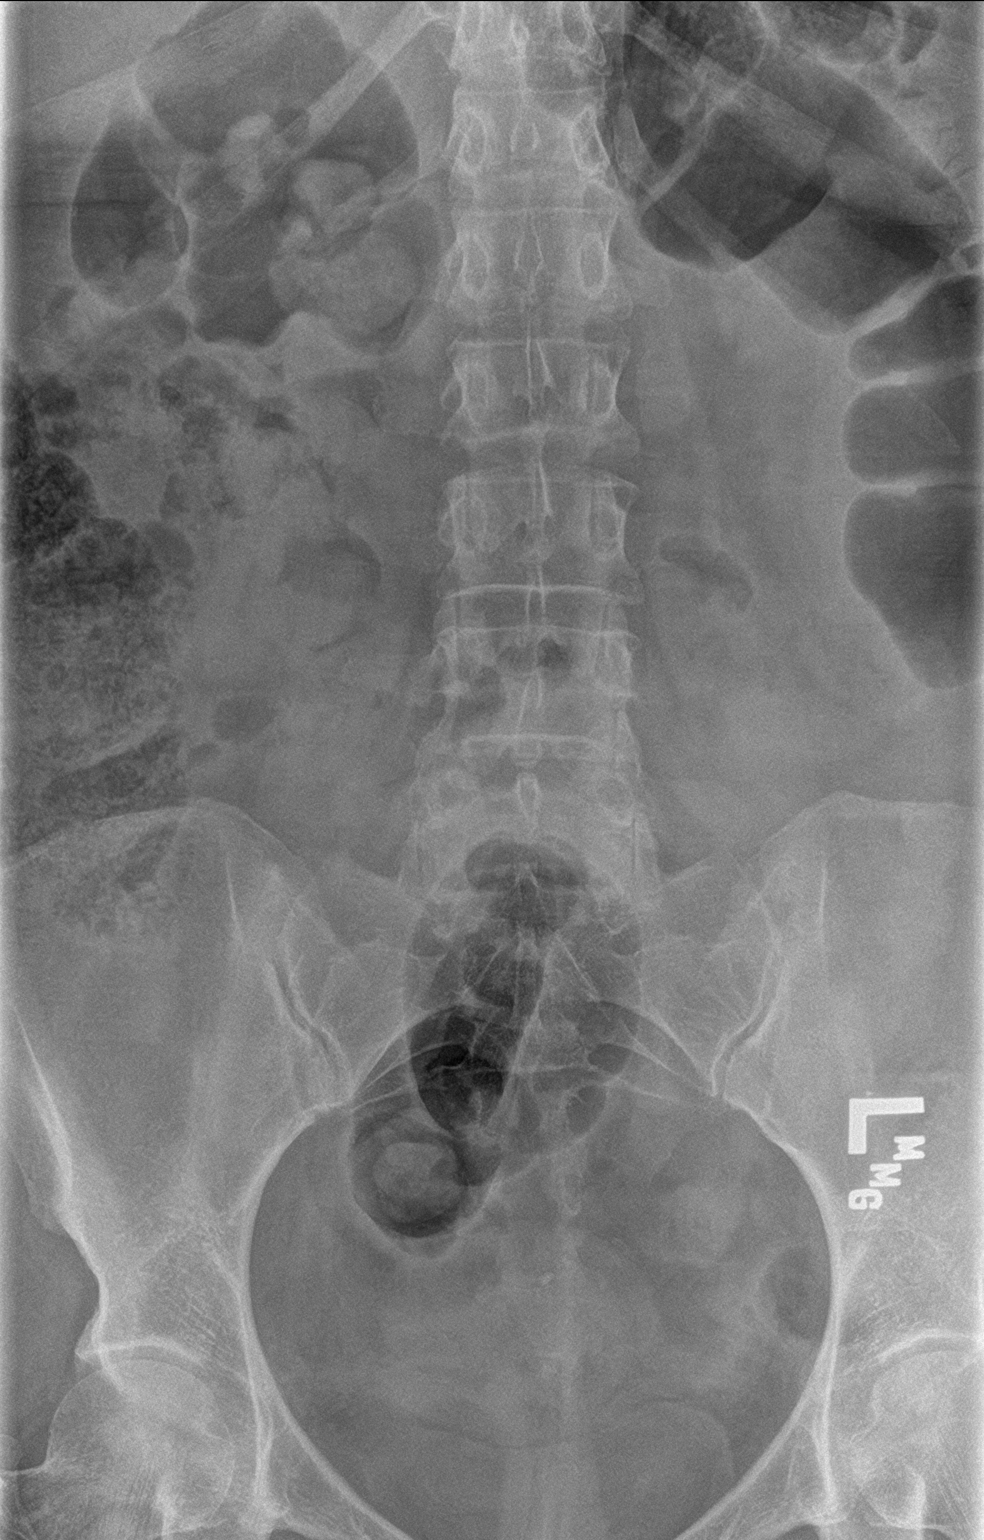

[l-spine lat]
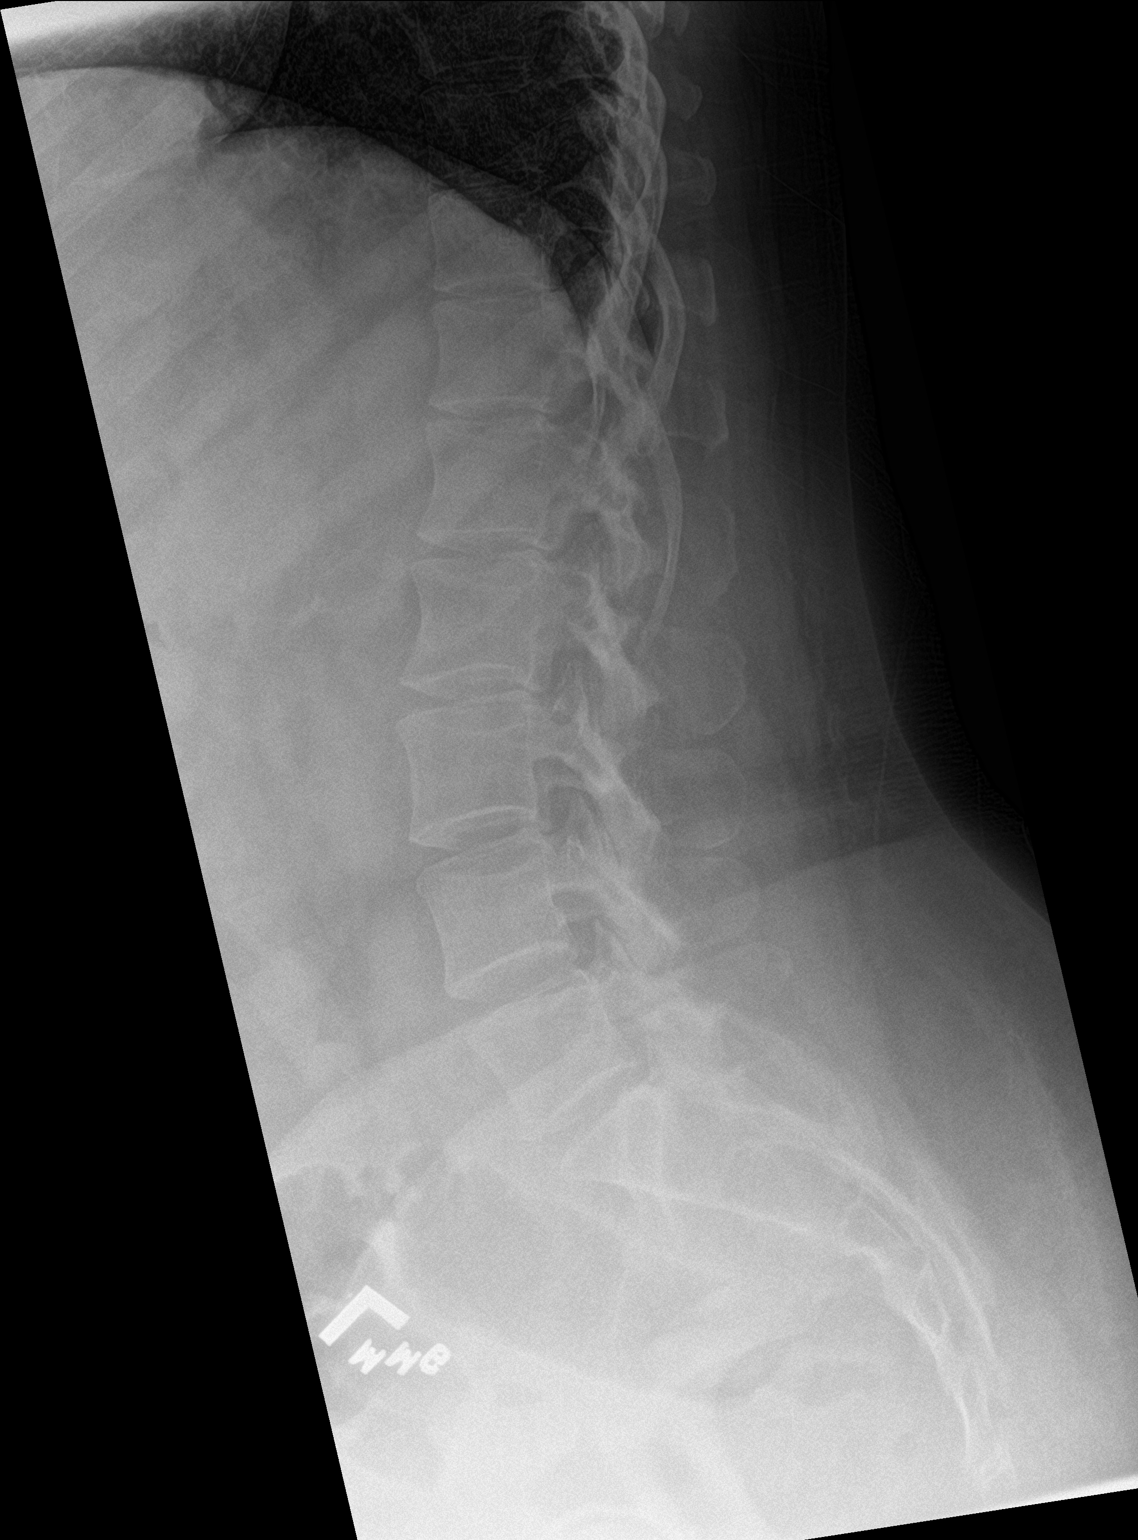

[l-spine spot]
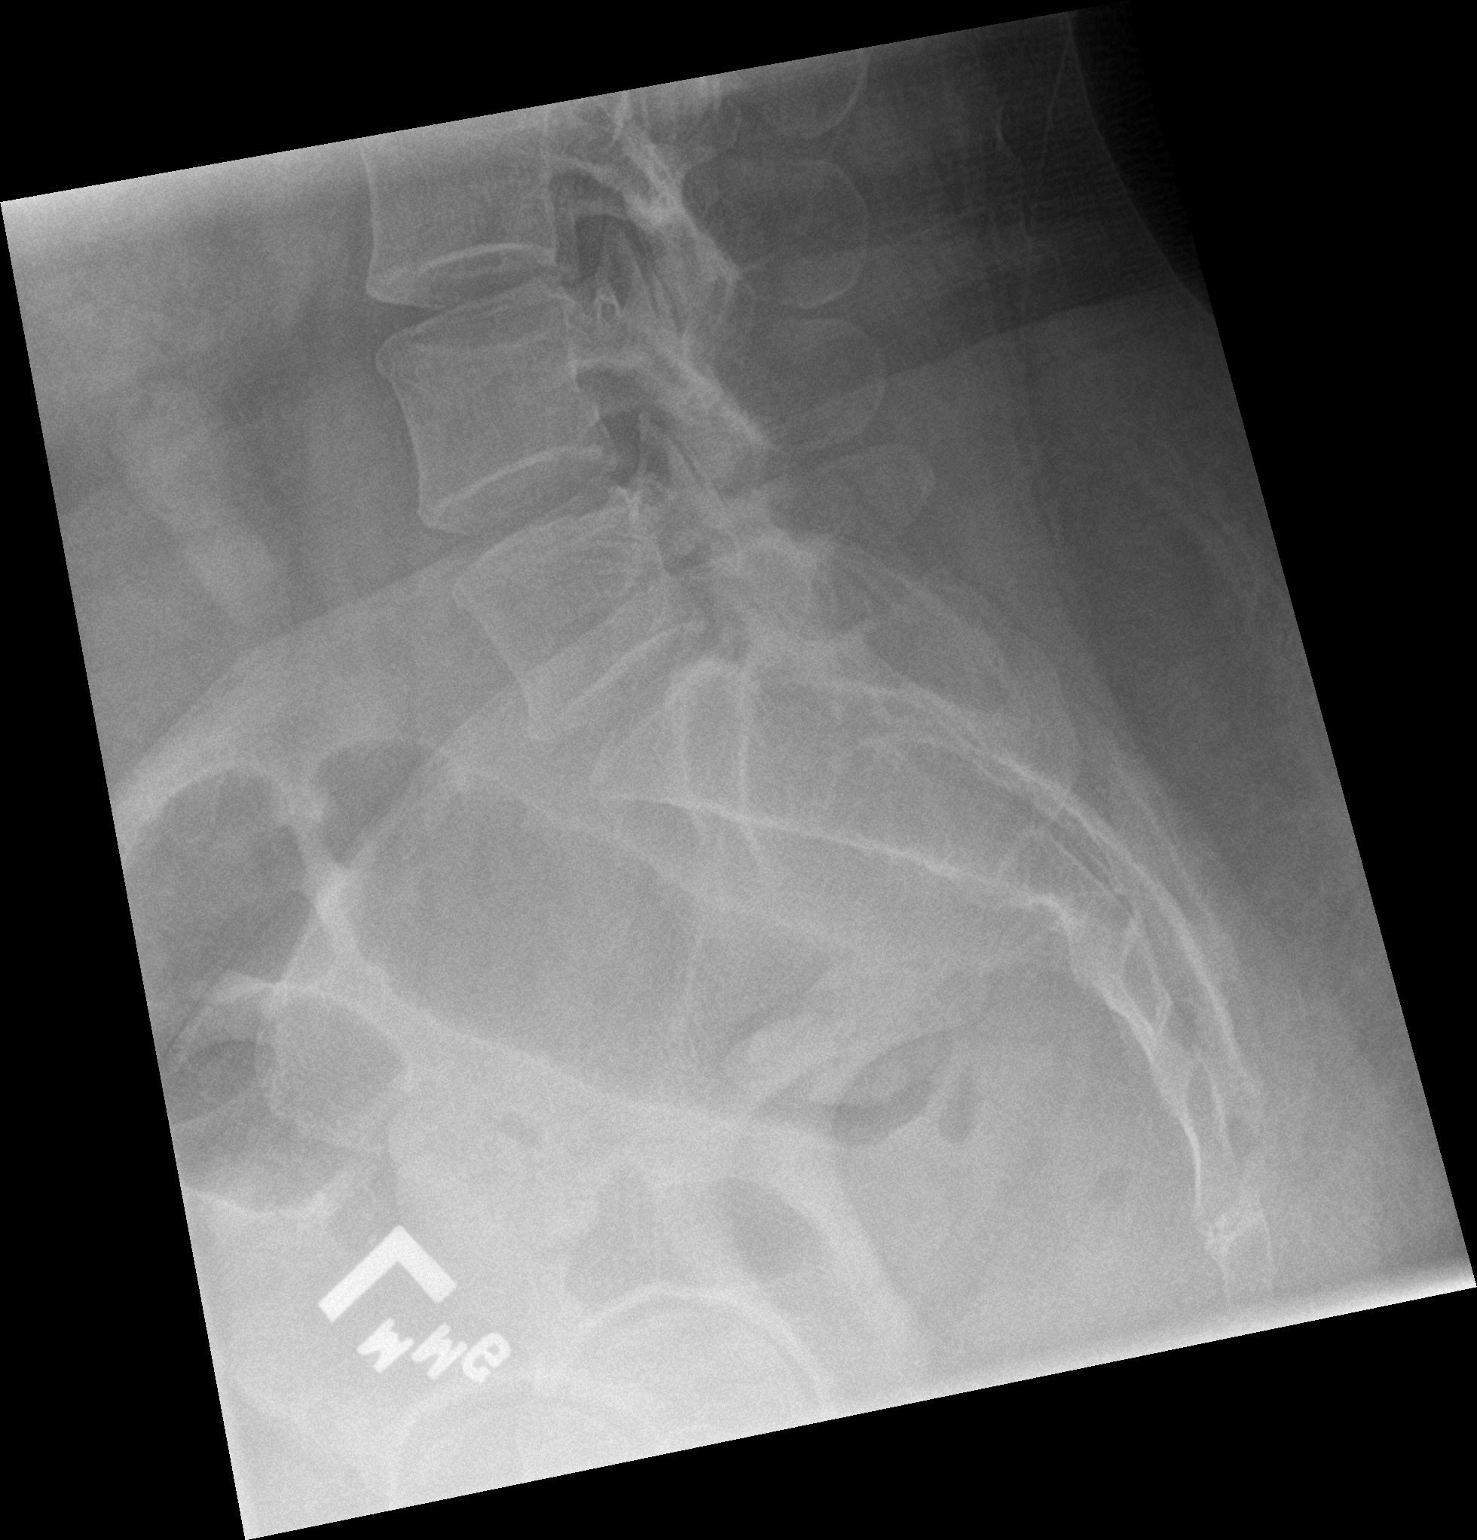

[3 of 3 positions shown; findings below may reference images not displayed]

FINDINGS: Soft tissue structures are unremarkable. Pelvic calcifications
consistent phleboliths. Stool in the colon. No bowel distention.
Mild degenerative change lumbar spine and both hips. No acute bony
abnormality identified.
IMPRESSION: Mild degenerative changes lumbar spine and both hips. No acute bony
abnormality.

## 2020-02-12 ENCOUNTER — Ambulatory Visit: Payer: Self-pay | Attending: Nurse Practitioner | Admitting: Nurse Practitioner

## 2020-02-12 ENCOUNTER — Other Ambulatory Visit: Payer: Self-pay

## 2020-02-12 ENCOUNTER — Other Ambulatory Visit: Payer: Self-pay | Admitting: Nurse Practitioner

## 2020-02-12 ENCOUNTER — Encounter: Payer: Self-pay | Admitting: Nurse Practitioner

## 2020-02-12 DIAGNOSIS — R1084 Generalized abdominal pain: Secondary | ICD-10-CM

## 2020-02-12 DIAGNOSIS — R399 Unspecified symptoms and signs involving the genitourinary system: Secondary | ICD-10-CM

## 2020-02-12 LAB — POCT URINALYSIS DIP (CLINITEK)
Bilirubin, UA: NEGATIVE
Blood, UA: NEGATIVE
Glucose, UA: NEGATIVE mg/dL
Ketones, POC UA: NEGATIVE mg/dL
Leukocytes, UA: NEGATIVE
Nitrite, UA: NEGATIVE
POC PROTEIN,UA: NEGATIVE
Spec Grav, UA: 1.025 (ref 1.010–1.025)
Urobilinogen, UA: 1 E.U./dL
pH, UA: 7 (ref 5.0–8.0)

## 2020-02-12 MED ORDER — METHOCARBAMOL 500 MG PO TABS: 500.0000 mg | ORAL_TABLET | Freq: Four times a day (QID) | ORAL | 0 refills | Status: AC

## 2020-02-12 MED FILL — METHOCARBAMOL 500 MG TABS: 500 | 7 days supply | Qty: 28 | Fill #0

## 2020-02-12 NOTE — Progress Notes (Signed)
Virtual Visit via Telephone Note Due to national recommendations of social distancing due to COVID 19, telehealth visit is felt to be most appropriate for this patient at this time.  I discussed the limitations, risks, security and privacy concerns of performing an evaluation and management service by telephone and the availability of in person appointments. I also discussed with the patient that there may be a patient responsible charge related to this service. The patient expressed understanding and agreed to proceed.    I connected with Nancy Terry on 02/12/20  at   3:10 PM EDT  EDT by telephone and verified that I am speaking with the correct person using two identifiers.   Consent I discussed the limitations, risks, security and privacy concerns of performing an evaluation and management service by telephone and the availability of in person appointments. I also discussed with the patient that there may be a patient responsible charge related to this service. The patient expressed understanding and agreed to proceed.   Location of Patient: Private Residence    [facility] of Provider[facility]    Persons participating in Telemedicine visit: Bertram Denver FNP-BC [place] CMA Nancy Terry    History of Present Illness: Telemedicine visit for: UTI symptoms  Onset 4 days ago. Described as dull and associated with nausea. There is left sided flank and abdominal pain which is worse with standing or walking for more than 3-4 minutes. She denies dysuria or hematuria but believes she has a kidney stone. States she used to get them all the time since she was 44 years old. However I am unable to locate records of any re occurring ED visits or hospital admissions for this. States the pain feels like previous kidney stones and is 5/10.    Past Medical History:  Diagnosis Date  . Anxiety   . Depression   . Hepatitis C   . Herpes infection   . Hypertension   .  Kidney stones   . Migraines   . Narcotic abuse [facility])     Past Surgical History:  Procedure Laterality Date  . BREAST BIOPSY    . ELBOW SURGERY Left   . KIDNEY STONE SURGERY     with stent placement  . LITHOTRIPSY    . TUBAL LIGATION      Family History  Problem Relation Age of Onset  . Parkinson's disease Mother   . Hypertension Mother   . Rheum arthritis Mother   . Heart failure Mother   . Parkinson's disease Maternal Grandmother   . Hypertension Maternal Grandmother   . Glaucoma Maternal Grandmother   . Lung cancer Maternal Grandmother   . Colon cancer Neg Hx   . Stomach cancer Neg Hx   . Pancreatic cancer Neg Hx   . Esophageal cancer Neg Hx     Social History   Socioeconomic History  . Marital status: Divorced    Spouse name: Not on file  . Number of children: 3  . Years of education: Not on file  . Highest education level: 9th grade  Occupational History  . Not on file  Tobacco Use  . Smoking status: Current Every Day Smoker    Packs/day: 1.00    Years: 0.50    Pack years: 0.50    Types: Cigarettes  . Smokeless tobacco: Never Used  Vaping Use  . Vaping Use: Never used  Substance and Sexual Activity  . Alcohol use: No  . Drug use: Not Currently  Types: Heroin, Marijuana    Comment:  smoked marijuana 4 days ago.  hasn't used heroin since 06/01/2014  . Sexual activity: Not Currently    Birth control/protection: Surgical  Other Topics Concern  . Not on file  Social History Narrative  . Not on file   Social Determinants of Health   Financial Resource Strain:   . Difficulty of Paying Living Expenses: Not on file  Food Insecurity: Food Insecurity Present  . Worried About Programme researcher, broadcasting/film/video in the Last Year: Sometimes true  . Ran Out of Food in the Last Year: Sometimes true  Transportation Needs: No Transportation Needs  . Lack of Transportation (Medical): No  . Lack of Transportation (Non-Medical): No  Physical Activity:   . Days of Exercise per  Week: Not on file  . Minutes of Exercise per Session: Not on file  Stress:   . Feeling of Stress : Not on file  Social Connections:   . Frequency of Communication with Friends and Family: Not on file  . Frequency of Social Gatherings with Friends and Family: Not on file  . Attends Religious Services: Not on file  . Active Member of Clubs or Organizations: Not on file  . Attends Banker Meetings: Not on file  . Marital Status: Not on file     Observations/Objective: Awake, alert and oriented x 3   Review of Systems  Constitutional: Negative for fever, malaise/fatigue and weight loss.  HENT: Negative.  Negative for nosebleeds.   Eyes: Negative.  Negative for blurred vision, double vision and photophobia.  Respiratory: Negative.  Negative for cough and shortness of breath.   Cardiovascular: Negative.  Negative for chest pain, palpitations and leg swelling.  Gastrointestinal: Positive for abdominal pain. Negative for heartburn, nausea and vomiting.  Genitourinary: Positive for flank pain. Negative for dysuria, frequency, hematuria and urgency.  Musculoskeletal: Negative for myalgias.  Neurological: Negative.  Negative for dizziness, focal weakness, seizures and headaches.  Psychiatric/Behavioral: Negative.  Negative for suicidal ideas.    Assessment and Plan: Nancy Terry was seen today for nephrolithiasis.  Diagnoses and all orders for this visit:  Generalized abdominal pain -     Basic metabolic panel -     methocarbamol (ROBAXIN) 500 MG tablet; Take 1 tablet (500 mg total) by mouth 4 (four) times daily for 7 days.  UTI symptoms -     POCT URINALYSIS DIP (CLINITEK)     Follow Up Instructions Return if symptoms worsen or fail to improve.     I discussed the assessment and treatment plan with the patient. The patient was provided an opportunity to ask questions and all were answered. The patient agreed with the plan and demonstrated an understanding of the  instructions.   The patient was advised to call back or seek an in-person evaluation if the symptoms worsen or if the condition fails to improve as anticipated.  I provided 13 minutes of non-face-to-face time during this encounter including median intraservice time, reviewing previous notes, labs, imaging, medications and explaining diagnosis and management.  Claiborne Rigg, FNP-BC

## 2020-02-13 LAB — BASIC METABOLIC PANEL
BUN/Creatinine Ratio: 12 (ref 9–23)
BUN: 9 mg/dL (ref 6–24)
CO2: 25 mmol/L (ref 20–29)
Calcium: 9.2 mg/dL (ref 8.7–10.2)
Chloride: 105 mmol/L (ref 96–106)
Creatinine, Ser: 0.76 mg/dL (ref 0.57–1.00)
GFR calc Af Amer: 110 mL/min/{1.73_m2} (ref >59)
GFR calc non Af Amer: 95 mL/min/{1.73_m2} (ref >59)
Glucose: 103 mg/dL — ABNORMAL HIGH (ref 65–99)
Potassium: 4.4 mmol/L (ref 3.5–5.2)
Sodium: 140 mmol/L (ref 134–144)

## 2020-02-14 ENCOUNTER — Encounter: Payer: Self-pay | Admitting: Nurse Practitioner

## 2020-02-29 ENCOUNTER — Telehealth: Payer: Self-pay | Admitting: Family Medicine

## 2020-02-29 ENCOUNTER — Other Ambulatory Visit: Payer: Self-pay

## 2020-02-29 ENCOUNTER — Encounter: Payer: Self-pay | Admitting: Family Medicine

## 2020-02-29 ENCOUNTER — Telehealth: Payer: Medicaid Other | Admitting: Family Medicine

## 2020-02-29 DIAGNOSIS — N920 Excessive and frequent menstruation with regular cycle: Secondary | ICD-10-CM

## 2020-02-29 NOTE — Telephone Encounter (Signed)
I call back the Pt LVM  to call back to see how we can help her

## 2020-02-29 NOTE — Telephone Encounter (Signed)
Copied from CRM [id number]. Topic: General - Other >> Feb 28, 2020  3:57 PM Dalphine Handing A wrote: Patient would like a callback from [place] today >> Feb 29, 2020 12:53 PM Crist Infante wrote: Pt calling and states she has called several times needing to speak / Mikle Bosworth to re new her orange card.  Pt states she is supposed to have surgery 10/05 and she needs to make sure this is done. Her card expired 9/22

## 2020-02-29 NOTE — Progress Notes (Signed)
Pt here in person for visit with Shawnie Pons, MD for endometrial biopsy. Pt unable to stay past appt time due to prior commitment. Pt states she would like to discuss some things with provider before procedure. Would like to discuss [place] results from 10/25/19. Explained we can do a virtual visit later this AM if pt is available. Pt agreeable.  I connected with  Rayetta Veith on 02/29/20 at 1112 by telephone and verified that I am speaking with the correct person using two identifiers.   I discussed the limitations, risks, security and privacy concerns of performing an evaluation and management service by telephone and the availability of in person appointments. I also discussed with the patient that there may be a patient responsible charge related to this service. The patient expressed understanding and agreed to proceed.  Pt states she is currently picking up grandchild from preschool and will be home in 30 minutes. Requests to finish visit at that time. States Dr. Shawnie Pons may call any time today. Reviewed with Shawnie Pons who will not be available later today, offers appt 03/11/20 at 1300 and pt agreeable.  Marjo Bicker, RN 02/29/2020  11:12 AM

## 2020-03-06 ENCOUNTER — Encounter (HOSPITAL_COMMUNITY): Payer: Self-pay

## 2020-03-06 ENCOUNTER — Ambulatory Visit (HOSPITAL_COMMUNITY)
Admission: EM | Admit: 2020-03-06 | Discharge: 2020-03-06 | Disposition: A | Discharge status: Home / Self Care | Payer: Medicaid Other | Attending: Family Medicine | Admitting: Family Medicine

## 2020-03-06 ENCOUNTER — Other Ambulatory Visit: Payer: Self-pay

## 2020-03-06 DIAGNOSIS — F17210 Nicotine dependence, cigarettes, uncomplicated: Secondary | ICD-10-CM | POA: Insufficient documentation

## 2020-03-06 DIAGNOSIS — Z888 Allergy status to other drugs, medicaments and biological substances status: Secondary | ICD-10-CM | POA: Insufficient documentation

## 2020-03-06 DIAGNOSIS — R112 Nausea with vomiting, unspecified: Secondary | ICD-10-CM | POA: Insufficient documentation

## 2020-03-06 DIAGNOSIS — J029 Acute pharyngitis, unspecified: Secondary | ICD-10-CM | POA: Insufficient documentation

## 2020-03-06 DIAGNOSIS — Z20822 Contact with and (suspected) exposure to covid-19: Secondary | ICD-10-CM | POA: Insufficient documentation

## 2020-03-06 DIAGNOSIS — I10 Essential (primary) hypertension: Secondary | ICD-10-CM | POA: Insufficient documentation

## 2020-03-06 DIAGNOSIS — Z8249 Family history of ischemic heart disease and other diseases of the circulatory system: Secondary | ICD-10-CM | POA: Insufficient documentation

## 2020-03-06 DIAGNOSIS — G43819 Other migraine, intractable, without status migrainosus: Secondary | ICD-10-CM | POA: Insufficient documentation

## 2020-03-06 LAB — SARS CORONAVIRUS 2 (TAT 6-24 HRS): SARS Coronavirus 2: NEGATIVE

## 2020-03-06 LAB — POCT RAPID STREP A, ED / UC: Streptococcus, Group A Screen (Direct): NEGATIVE

## 2020-03-06 MED ORDER — METOCLOPRAMIDE HCL 5 MG/ML IJ SOLN
5.0000 mg | Freq: Once | INTRAMUSCULAR | Status: AC
  Administered 2020-03-06: 5 mg via INTRAMUSCULAR

## 2020-03-06 MED ORDER — DEXAMETHASONE SODIUM PHOSPHATE 10 MG/ML IJ SOLN
10.0000 mg | Freq: Once | INTRAMUSCULAR | Status: AC
  Administered 2020-03-06: 10 mg via INTRAMUSCULAR

## 2020-03-06 MED ORDER — METOCLOPRAMIDE HCL 5 MG/ML IJ SOLN
INTRAMUSCULAR | Status: AC
  Filled 2020-03-06: qty 2

## 2020-03-06 MED ORDER — KETOROLAC TROMETHAMINE 30 MG/ML IJ SOLN
INTRAMUSCULAR | Status: AC
  Filled 2020-03-06: qty 1

## 2020-03-06 MED ORDER — DEXAMETHASONE SODIUM PHOSPHATE 10 MG/ML IJ SOLN
INTRAMUSCULAR | Status: AC
  Filled 2020-03-06: qty 1

## 2020-03-06 MED ORDER — KETOROLAC TROMETHAMINE 30 MG/ML IJ SOLN
30.0000 mg | Freq: Once | INTRAMUSCULAR | Status: AC
  Administered 2020-03-06: 30 mg via INTRAMUSCULAR

## 2020-03-06 NOTE — ED Triage Notes (Addendum)
Pt right-sided HA, "migraine", nausea x4 days that has not been relieved with ibuprofen and imitrex po.  Pt also c/o intermittent cough with clear secretions, sore throat, bilateral ear pain. Feels like "I have the flu".   Denies v/d, fever, chills, congestion, runny nose.  No medications taken this morning. Last took tylenol/ibuprofen last night. Pt states she received both COVID vaccines.

## 2020-03-06 NOTE — Discharge Instructions (Signed)
You have been tested for COVID-19 today. °If your test returns positive, you will receive a phone call from [facility] regarding your results. °Negative test results are not called. °Both positive and negative results area always visible on MyChart. °If you do not have a MyChart account, sign up instructions are provided in your discharge papers. °Please do not hesitate to contact us should you have questions or concerns. ° °

## 2020-03-06 NOTE — ED Provider Notes (Signed)
[facility] CARE CENTER   [id number] 03/06/20 Arrival Time: 0910  ASSESSMENT & PLAN:  1. Other migraine without status migrainosus, intractable   2. Acute sore throat   3. Intractable vomiting with nausea, unspecified vomiting type      Meds ordered this encounter  Medications  . ketorolac (TORADOL) 30 MG/ML injection 30 mg  . metoCLOPramide (REGLAN) injection 5 mg  . dexamethasone (DECADRON) injection 10 mg    Normal neurological exam. Afebrile without nuchal rigidity. Discussed. Current presentation and symptoms are consistent with prior migraines and are not consistent with SAH, ICH, meningitis, or temporal arteritis. Likely viral illness; COVID testing sent. Rapid strep negative. Culture sent.  Recommend:    Follow-up Information    Hoy Register, MD.   Specialty: Family Medicine Why: As needed. Contact information: [address] [place] [address] [phone]        [facility] EMERGENCY DEPARTMENT.   Specialty: Emergency Medicine Why: If worsening or failing to improve as anticipated. Contact information: [id number] [place] [address] [phone]                Reviewed expectations re: course of current medical issues. Questions answered. Outlined signs and symptoms indicating need for more acute intervention. Patient verbalized understanding. After Visit Summary given.   SUBJECTIVE: History from: patient. Patient is able to give a clear and coherent history.  Nancy Terry is a 44 y.o. female who presents with complaint of a migraine headache. H/O migraines with same/similar symptoms. Onset gradual, 4 d ago. Location: right-sided unilateral without radiation. Precipitating factors include: none which have been determined. Also reports a sore throat without fever and L otalgia without drainage or bleeding. Associated symptoms: Preceding aura: no. Nausea/vomiting: yes; but  dealing with chronic symptoms; seeing GI specialist. Vision changes: no. Increased sensitivity to light and to noises: yes. Extremity weakness: no. Home treatment has included Tylenol/ibuprofen with little improvement. Current headache has limited normal daily activities. Denies dizziness, loss of balance and speech difficulties. No head injury reported. Ambulatory without difficulty. No recent travel.   OBJECTIVE:  Vitals:   03/06/20 1012  BP: (!) 154/52  Pulse: (!) 58  Resp: 18  Temp: 98.4 F (36.9 C)  TempSrc: Oral  SpO2: 100%    General appearance: alert; NAD but prefers darkened room HENT: normocephalic; atraumatic: TMs with cerumen impaction (declines irrigation this visit); throat with significant erythema Eyes: PERRLA; EOMI; conjunctivae normal Neck: supple with FROM Lungs: speaks full sentences without difficutly; unlabored respirations Extremities: no edema; symmetrical with no gross deformities Skin: warm and dry Neurologic: alert; speech is fluent and clear without dysarthria or aphasia; CN 2-12 grossly intact; no facial droop; normal gait Psychological: alert and cooperative; normal mood and affect  Labs: Results for orders placed or performed during the hospital encounter of 03/06/20  POCT Rapid Strep A (ED/UC)  Result Value Ref Range   Streptococcus, Group A Screen (Direct) NEGATIVE NEGATIVE   Labs Reviewed  SARS CORONAVIRUS 2 (TAT 6-24 HRS)  CULTURE, GROUP A STREP [facility])  POCT RAPID STREP A, ED / UC    Allergies  Allergen Reactions  . Haldol [Haloperidol Decanoate] Other (See Comments)    Hallucinations  . Nitrates, Organic Other (See Comments)    headaches    Past Medical History:  Diagnosis Date  . Anxiety   . Depression   . Hepatitis C   . Herpes infection   . Hypertension   . Kidney stones   .  Migraines   . Narcotic abuse [facility]    Social History   Socioeconomic History  . Marital status: Divorced    Spouse name: Not on file  .  Number of children: 3  . Years of education: Not on file  . Highest education level: 9th grade  Occupational History  . Not on file  Tobacco Use  . Smoking status: Current Every Day Smoker    Packs/day: 1.00    Years: 0.50    Pack years: 0.50    Types: Cigarettes  . Smokeless tobacco: Never Used  Vaping Use  . Vaping Use: Never used  Substance and Sexual Activity  . Alcohol use: No  . Drug use: Not Currently    Types: Heroin, Marijuana    Comment:  smoked marijuana 4 days ago.  hasn't used heroin since 06/01/2014  . Sexual activity: Not Currently    Birth control/protection: Surgical  Other Topics Concern  . Not on file  Social History Narrative  . Not on file   Social Determinants of Health   Financial Resource Strain:   . Difficulty of Paying Living Expenses: Not on file  Food Insecurity: No Food Insecurity  . Worried About Programme researcher, broadcasting/film/video in the Last Year: Never true  . Ran Out of Food in the Last Year: Never true  Transportation Needs: No Transportation Needs  . Lack of Transportation (Medical): No  . Lack of Transportation (Non-Medical): No  Physical Activity:   . Days of Exercise per Week: Not on file  . Minutes of Exercise per Session: Not on file  Stress:   . Feeling of Stress : Not on file  Social Connections:   . Frequency of Communication with Friends and Family: Not on file  . Frequency of Social Gatherings with Friends and Family: Not on file  . Attends Religious Services: Not on file  . Active Member of Clubs or Organizations: Not on file  . Attends Banker Meetings: Not on file  . Marital Status: Not on file  Intimate Partner Violence:   . Fear of Current or Ex-Partner: Not on file  . Emotionally Abused: Not on file  . Physically Abused: Not on file  . Sexually Abused: Not on file   Family History  Problem Relation Age of Onset  . Parkinson's disease Mother   . Hypertension Mother   . Rheum arthritis Mother   . Heart failure  Mother   . Parkinson's disease Maternal Grandmother   . Hypertension Maternal Grandmother   . Glaucoma Maternal Grandmother   . Lung cancer Maternal Grandmother   . Colon cancer Neg Hx   . Stomach cancer Neg Hx   . Pancreatic cancer Neg Hx   . Esophageal cancer Neg Hx    Past Surgical History:  Procedure Laterality Date  . BREAST BIOPSY    . ELBOW SURGERY Left   . KIDNEY STONE SURGERY     with stent placement  . LITHOTRIPSY    . TUBAL LIGATION       Mardella Layman, MD 03/06/20 [phone]

## 2020-03-06 NOTE — ED Notes (Signed)
Patient refused ear wax removal, she states she does not have time to wait today. Her driver is diabetic and they need to leave. Provider notified. Pt discharged by provider.

## 2020-03-07 ENCOUNTER — Other Ambulatory Visit: Payer: Self-pay | Admitting: Family Medicine

## 2020-03-07 DIAGNOSIS — L309 Dermatitis, unspecified: Secondary | ICD-10-CM

## 2020-03-07 MED FILL — TRIAMCINOLONE 0.5% CREAM: 0.5 | 30 days supply | Qty: 45 | Fill #1

## 2020-03-08 LAB — CULTURE, GROUP A STREP (THRC)

## 2020-03-10 ENCOUNTER — Ambulatory Visit: Payer: Self-pay | Attending: Family Medicine

## 2020-03-10 ENCOUNTER — Other Ambulatory Visit: Payer: Self-pay

## 2020-03-10 ENCOUNTER — Telehealth: Payer: Self-pay | Admitting: Gastroenterology

## 2020-03-10 ENCOUNTER — Encounter: Payer: Medicaid Other | Admitting: Internal Medicine

## 2020-03-10 NOTE — Telephone Encounter (Signed)
Noted! Thank you

## 2020-03-10 NOTE — Telephone Encounter (Signed)
Hi Dr. Orvan Falconer, this pt just cancelled her procedure with you tomorrow because she is having sore throat and is not feeling well. She tested negative for Covid but stated to feel sick. She will call back to reschedule. Thank you.

## 2020-03-11 ENCOUNTER — Encounter: Payer: Medicaid Other | Admitting: Gastroenterology

## 2020-03-11 ENCOUNTER — Ambulatory Visit: Payer: Medicaid Other | Admitting: Family Medicine

## 2020-03-11 NOTE — Telephone Encounter (Signed)
Pt came to the office to apply for financial, unable to do at that time since she is missing the IRS papers

## 2020-03-11 NOTE — Telephone Encounter (Signed)
Copied from CRM [id number]. Topic: General - Other >> Feb 28, 2020  3:57 PM Dalphine Handing A wrote: Patient would like a callback from [place] today >> Feb 29, 2020 12:53 PM Crist Infante wrote: Pt calling and states she has called several times needing to speak / Mikle Bosworth to re new her orange card.  Pt states she is supposed to have surgery 10/05 and she needs to make sure this is done. Her card expired 9/22

## 2020-03-26 ENCOUNTER — Ambulatory Visit (HOSPITAL_COMMUNITY)
Admission: EM | Admit: 2020-03-26 | Discharge: 2020-03-26 | Disposition: A | Discharge status: Home / Self Care | Payer: Medicaid Other

## 2020-03-26 ENCOUNTER — Other Ambulatory Visit: Payer: Self-pay

## 2020-03-27 ENCOUNTER — Emergency Department (HOSPITAL_COMMUNITY)
Admission: EM | Admit: 2020-03-27 | Discharge: 2020-03-28 | Disposition: A | Discharge status: Home / Self Care | Payer: Medicaid Other | Attending: Emergency Medicine | Admitting: Emergency Medicine

## 2020-03-27 ENCOUNTER — Other Ambulatory Visit: Payer: Self-pay

## 2020-03-27 ENCOUNTER — Encounter (HOSPITAL_COMMUNITY): Payer: Self-pay | Admitting: *Deleted

## 2020-03-27 ENCOUNTER — Encounter (HOSPITAL_COMMUNITY): Payer: Self-pay | Admitting: Emergency Medicine

## 2020-03-27 ENCOUNTER — Ambulatory Visit (HOSPITAL_COMMUNITY)
Admission: EM | Admit: 2020-03-27 | Discharge: 2020-03-27 | Disposition: A | Discharge status: Home / Self Care | Payer: Self-pay | Attending: Family Medicine | Admitting: Family Medicine

## 2020-03-27 DIAGNOSIS — R5383 Other fatigue: Secondary | ICD-10-CM

## 2020-03-27 DIAGNOSIS — R319 Hematuria, unspecified: Secondary | ICD-10-CM | POA: Insufficient documentation

## 2020-03-27 DIAGNOSIS — R109 Unspecified abdominal pain: Secondary | ICD-10-CM | POA: Insufficient documentation

## 2020-03-27 DIAGNOSIS — Z5321 Procedure and treatment not carried out due to patient leaving prior to being seen by health care provider: Secondary | ICD-10-CM | POA: Insufficient documentation

## 2020-03-27 DIAGNOSIS — R310 Gross hematuria: Secondary | ICD-10-CM

## 2020-03-27 DIAGNOSIS — M549 Dorsalgia, unspecified: Secondary | ICD-10-CM

## 2020-03-27 LAB — POCT URINALYSIS DIPSTICK, ED / UC
Glucose, UA: NEGATIVE mg/dL
Leukocytes,Ua: NEGATIVE
Nitrite: NEGATIVE
Protein, ur: 100 mg/dL — AB
Specific Gravity, Urine: >=1.03 (ref 1.005–1.030)
Urobilinogen, UA: 0.2 mg/dL (ref 0.0–1.0)
pH: 5 (ref 5.0–8.0)

## 2020-03-27 MED ORDER — KETOROLAC TROMETHAMINE 60 MG/2ML IM SOLN
60.0000 mg | Freq: Once | INTRAMUSCULAR | Status: AC
  Administered 2020-03-27: 60 mg via INTRAMUSCULAR

## 2020-03-27 MED ORDER — KETOROLAC TROMETHAMINE 60 MG/2ML IM SOLN
INTRAMUSCULAR | Status: AC
  Filled 2020-03-27: qty 2

## 2020-03-27 NOTE — ED Notes (Signed)
Pt keeps drifting off the sleep while I was triaging her and leaning to the side.  She states that she took phenergan pta

## 2020-03-27 NOTE — ED Notes (Signed)
Patient is being discharged from the Urgent Care Center and sent to the Emergency Department via wheelchair by staff. Per Dahlia Byes, NP, patient is stable but in need of higher level of care due to gross hematuria, back pain. Patient is aware and verbalizes understanding of plan of care.  Vitals:   03/27/20 1202  BP: 134/83  Pulse: 80  Resp: (!) 22  Temp: 98.6 F (37 C)  SpO2: 97%

## 2020-03-27 NOTE — ED Notes (Signed)
No urine ordered as pt has had a UA today at [facility] and it is visible for [place] in epic

## 2020-03-27 NOTE — Discharge Instructions (Signed)
Recommend going to the ER based on amount of pain and gross blood in your urine You need a CT scan based on your history of kidney stones and urinary stents Follow up as needed for continued or worsening symptoms

## 2020-03-27 NOTE — ED Triage Notes (Addendum)
Pt c/o mid back pain and blood in her urine x 4 days. Pt states this has happened before and she had to have a stent placed in her ureter. Pt states she feels like she can hardly urinate. Pt states her dog jumped on her right legs and it is red and blistering up.

## 2020-03-27 NOTE — ED Provider Notes (Signed)
[facility]    CSN: [id number] Arrival date & time: 03/27/20  1010      History   Chief Complaint Chief Complaint  Patient presents with  . Hematuria  . Fatigue  . Back Pain  . Cellulitis    HPI Nancy Terry is a 44 y.o. female.   Patient is a 44 year old female with past medical history of anxiety, depression, hepatitis C, herpes, hypertension, kidney stones, narcotic abuse, lithotripsy with urinary stent placement.  She presents today with proximally 5 days of worsening bilateral flank pain worse on the right side, fatigue, hematuria.  She is also has some urinary retention.  History of lithotripsy and urinary stent placement.  Denies any dysuria, urinary frequency, fever, nausea or vomiting.     Past Medical History:  Diagnosis Date  . Anxiety   . Depression   . Hepatitis C   . Herpes infection   . Hypertension   . Kidney stones   . Migraines   . Narcotic abuse [facility])     Patient Active Problem List   Diagnosis Date Noted  . Hidradenitis 10/10/2019  . Tobacco use 10/10/2019  . Chronic pain syndrome 04/11/2018  . Complex ovarian cyst 11/21/2017  . Hypertension 11/21/2017  . Menorrhagia 05/13/2017  . Sciatica 05/13/2017  . Migraines 05/07/2016  . Hepatitis C 05/07/2016  . Depression 04/05/2016  . Anxiety and depression 04/05/2016  . Narcotic abuse (HCC) 04/05/2016  . Breast lump on right side at 10 o'clock position 03/12/2014  . Pap smear of cervix shows high risk HPV present 03/12/2014    Past Surgical History:  Procedure Laterality Date  . BREAST BIOPSY    . ELBOW SURGERY Left   . KIDNEY STONE SURGERY     with stent placement  . LITHOTRIPSY    . TUBAL LIGATION      OB History    Gravida  3   Para  3   Term  3   Preterm      AB      Living  3     SAB      TAB      Ectopic      Multiple      Live Births               Home Medications    Prior to Admission medications   Medication Sig Start Date End Date  Taking? Authorizing Provider  ibuprofen (ADVIL) 200 MG tablet Take 200 mg by mouth every 6 (six) hours as needed.   Yes [provider]  methadone (DOLOPHINE) 10 MG/5ML solution Take 130 mg by mouth every other day. 110 mg every other day   Yes [provider]  CLEOCIN-T 1 % lotion Apply topically. 11/02/19   [provider]  mupirocin ointment (BACTROBAN) 2 % Apply 1 application topically 2 (two) times daily. 11/02/19   [provider]  ondansetron (ZOFRAN) 4 MG tablet Take 1 tablet (4 mg total) by mouth every 8 (eight) hours as needed for nausea or vomiting. 10/09/19   Terrilee Files, MD  triamcinolone cream (KENALOG) 0.5 % Apply 1 application topically 2 (two) times daily. 01/29/20   [provider]    Family History Family History  Problem Relation Age of Onset  . Parkinson's disease Mother   . Hypertension Mother   . Rheum arthritis Mother   . Heart failure Mother   . Parkinson's disease Maternal Grandmother   . Hypertension Maternal Grandmother   .  Glaucoma Maternal Grandmother   . Lung cancer Maternal Grandmother   . Colon cancer Neg Hx   . Stomach cancer Neg Hx   . Pancreatic cancer Neg Hx   . Esophageal cancer Neg Hx     Social History Social History   Tobacco Use  . Smoking status: Current Every Day Smoker    Packs/day: 1.00    Years: 0.50    Pack years: 0.50    Types: Cigarettes  . Smokeless tobacco: Never Used  Vaping Use  . Vaping Use: Never used  Substance Use Topics  . Alcohol use: No  . Drug use: Not Currently    Types: Heroin, Marijuana    Comment:  smoked marijuana 4 days ago.  hasn't used heroin since 06/01/2014     Allergies   Haldol [haloperidol decanoate] and Nitrates, organic   Review of Systems Review of Systems   Physical Exam Triage Vital Signs ED Triage Vitals  Enc Vitals Group     BP 03/27/20 1202 134/83     Pulse Rate 03/27/20 1202 80     Resp 03/27/20 1202 (!) 22     Temp 03/27/20  1202 98.6 F (37 C)     Temp Source 03/27/20 1202 Oral     SpO2 03/27/20 1202 97 %     Weight --      Height --      Head Circumference --      Peak Flow --      Pain Score 03/27/20 1203 10     Pain Loc --      Pain Edu? --      Excl. in GC? --    No data found.  Updated Vital Signs BP 134/83 (BP Location: Left Arm)   Pulse 80   Temp 98.6 F (37 C) (Oral)   Resp (!) 22   LMP 03/27/2020   SpO2 97%   Visual Acuity Right Eye Distance:   Left Eye Distance:   Bilateral Distance:    Right Eye Near:   Left Eye Near:    Bilateral Near:     Physical Exam Vitals and nursing note reviewed.  Constitutional:      General: She is not in acute distress.    Appearance: Normal appearance. She is not ill-appearing, toxic-appearing or diaphoretic.  HENT:     Head: Normocephalic.     Nose: Nose normal.  Eyes:     Conjunctiva/sclera: Conjunctivae normal.  Pulmonary:     Effort: Pulmonary effort is normal.  Abdominal:     Tenderness: There is right CVA tenderness and left CVA tenderness.  Musculoskeletal:        General: Normal range of motion.     Cervical back: Normal range of motion.  Skin:    General: Skin is warm and dry.     Findings: No rash.  Neurological:     Mental Status: She is alert.  Psychiatric:        Mood and Affect: Mood normal.      UC Treatments / Results  Labs (all labs ordered are listed, but only abnormal results are displayed) Labs Reviewed  POCT URINALYSIS DIPSTICK, ED / UC - Abnormal; Notable for the following components:      Result Value   Bilirubin Urine SMALL (*)    Ketones, ur TRACE (*)    Hgb urine dipstick LARGE (*)    Protein, ur 100 (*)    All other components within normal limits  URINALYSIS, ROUTINE W  REFLEX MICROSCOPIC    EKG   Radiology No results found.  Procedures Procedures (including critical care time)  Medications Ordered in UC Medications  ketorolac (TORADOL) injection 60 mg (60 mg Intramuscular Given  03/27/20 1236)    Initial Impression / Assessment and Plan / UC Course  I have reviewed the triage vital signs and the nursing notes.  Pertinent labs & imaging results that were available during my care of the patient were reviewed by me and considered in my medical decision making (see chart for details).     Gross hematuria, bilateral flank pain and urinary retention. Based on symptoms and history recommended CT scan for imaging since this is been ongoing and worsening over 5 days.  Concern for large kidney stone that is unable to pass Toradol given here for pain Pt heading to the ER.   Final Clinical Impressions(s) / UC Diagnoses   Final diagnoses:  Gross hematuria  Flank pain     Discharge Instructions     Recommend going to the ER based on amount of pain and gross blood in your urine You need a CT scan based on your history of kidney stones and urinary stents Follow up as needed for continued or worsening symptoms     ED Prescriptions    None     PDMP not reviewed this encounter.   Janace Aris, NP 03/27/20 1615

## 2020-03-27 NOTE — ED Triage Notes (Signed)
Pt was seen for right flank pain with hematuria today at [facility] and told to come to ED.  Pt also has hx of kidney stones.

## 2020-03-27 NOTE — ED Notes (Signed)
Pt is in lobby and keeps nodding off and on. Pt is leaning in wheel chair and want stay awake.

## 2020-03-28 ENCOUNTER — Telehealth: Payer: Self-pay | Admitting: Family Medicine

## 2020-03-28 LAB — CBC
HCT: 34.4 % — ABNORMAL LOW (ref 36.0–46.0)
Hemoglobin: 9.9 g/dL — ABNORMAL LOW (ref 12.0–15.0)
MCH: 22.8 pg — ABNORMAL LOW (ref 26.0–34.0)
MCHC: 28.8 g/dL — ABNORMAL LOW (ref 30.0–36.0)
MCV: 79.1 fL — ABNORMAL LOW (ref 80.0–100.0)
Platelets: 366 10*3/uL (ref 150–400)
RBC: 4.35 MIL/uL (ref 3.87–5.11)
RDW: 16.7 % — ABNORMAL HIGH (ref 11.5–15.5)
WBC: 6.1 10*3/uL (ref 4.0–10.5)
nRBC: 0 % (ref 0.0–0.2)

## 2020-03-28 LAB — BASIC METABOLIC PANEL
Anion gap: 9 (ref 5–15)
BUN: 5 mg/dL — ABNORMAL LOW (ref 6–20)
CO2: 23 mmol/L (ref 22–32)
Calcium: 8.7 mg/dL — ABNORMAL LOW (ref 8.9–10.3)
Chloride: 108 mmol/L (ref 98–111)
Creatinine, Ser: 0.74 mg/dL (ref 0.44–1.00)
GFR, Estimated: >60 mL/min (ref >60)
Glucose, Bld: 92 mg/dL (ref 70–99)
Potassium: 3.4 mmol/L — ABNORMAL LOW (ref 3.5–5.1)
Sodium: 140 mmol/L (ref 135–145)

## 2020-03-28 LAB — I-STAT BETA HCG BLOOD, ED (MC, WL, AP ONLY): I-stat hCG, quantitative: <5 m[IU]/mL (ref <5)

## 2020-03-28 NOTE — Telephone Encounter (Signed)
Copied from CRM [id number]. Topic: General - Other >> Mar 26, 2020  3:33 PM Wyonia Hough E wrote: Reason for CRM: Pt stated she has an emergency about her orange card. It is expired but pt has a lot of health issues going on at this time and asked for a call from [facility] asap/ please advise

## 2020-03-28 NOTE — Telephone Encounter (Signed)
I call back the Pt, LVM to call me back 

## 2020-03-28 NOTE — ED Notes (Signed)
Pt states she has been between 4 hospitals and is not waiting any longer. Pt now outside waiting for her ride

## 2020-04-01 ENCOUNTER — Ambulatory Visit: Payer: Medicaid Other | Admitting: Critical Care Medicine

## 2020-04-01 ENCOUNTER — Ambulatory Visit: Payer: Medicaid Other | Attending: Family Medicine

## 2020-04-01 ENCOUNTER — Other Ambulatory Visit: Payer: Self-pay

## 2020-04-02 ENCOUNTER — Encounter (INDEPENDENT_AMBULATORY_CARE_PROVIDER_SITE_OTHER): Payer: Self-pay | Admitting: Primary Care

## 2020-04-02 ENCOUNTER — Ambulatory Visit (INDEPENDENT_AMBULATORY_CARE_PROVIDER_SITE_OTHER): Payer: Self-pay | Admitting: Primary Care

## 2020-04-02 ENCOUNTER — Other Ambulatory Visit (INDEPENDENT_AMBULATORY_CARE_PROVIDER_SITE_OTHER): Payer: Self-pay | Admitting: Primary Care

## 2020-04-02 ENCOUNTER — Telehealth: Payer: Self-pay | Admitting: Family Medicine

## 2020-04-02 VITALS — BP 118/60 | HR 59 | Temp 97.5°F | Ht 64.0 in | Wt 220.6 lb

## 2020-04-02 DIAGNOSIS — T148XXA Other injury of unspecified body region, initial encounter: Secondary | ICD-10-CM

## 2020-04-02 DIAGNOSIS — Z23 Encounter for immunization: Secondary | ICD-10-CM

## 2020-04-02 DIAGNOSIS — L089 Local infection of the skin and subcutaneous tissue, unspecified: Secondary | ICD-10-CM

## 2020-04-02 MED ORDER — FLUCONAZOLE 150 MG PO TABS: 150.0000 mg | ORAL_TABLET | Freq: Once | ORAL | 0 refills | Status: DC

## 2020-04-02 MED ORDER — CEPHALEXIN 500 MG PO CAPS: 500.0000 mg | ORAL_CAPSULE | Freq: Three times a day (TID) | ORAL | 0 refills | Status: DC

## 2020-04-02 MED FILL — CEPHALEXIN 500 MG CAPSULE: 500 | 7 days supply | Qty: 21 | Fill #0

## 2020-04-02 MED FILL — FLUCONAZOLE 150 MG TABLET: 150 | 1 days supply | Qty: 1 | Fill #0

## 2020-04-02 NOTE — Telephone Encounter (Signed)
I return Pt call, LVM inform that [employer] does not send any fax back to [place] from the client, they send it to them by mail also I provide you the phone number you can call them to see what happen  [phone]

## 2020-04-02 NOTE — Progress Notes (Signed)
Acute Office Visit  Subjective:    Patient ID: Nancy Terry, female    DOB: [birth year], 44 y.o.   MRN: [id number]  Chief Complaint  Patient presents with  . Back Pain    pain in kidneys  . Blister    on legs    HPI Nancy Terry is a 44 year old obese female who fell and dog drag her on gravel drive way . Nancy Terry has been keeping area clean and covered. Not warm to touch , malodorous, scant amount of drainage. Also, back hurts from fall 5/10 pain and all on right side.    Past Medical History:  Diagnosis Date  . Anxiety   . Depression   . Hepatitis C   . Herpes infection   . Hypertension   . Kidney stones   . Migraines   . Narcotic abuse [facility])     Past Surgical History:  Procedure Laterality Date  . BREAST BIOPSY    . ELBOW SURGERY Left   . KIDNEY STONE SURGERY     with stent placement  . LITHOTRIPSY    . TUBAL LIGATION      Family History  Problem Relation Age of Onset  . Parkinson's disease Mother   . Hypertension Mother   . Rheum arthritis Mother   . Heart failure Mother   . Parkinson's disease Maternal Grandmother   . Hypertension Maternal Grandmother   . Glaucoma Maternal Grandmother   . Lung cancer Maternal Grandmother   . Colon cancer Neg Hx   . Stomach cancer Neg Hx   . Pancreatic cancer Neg Hx   . Esophageal cancer Neg Hx     Social History   Socioeconomic History  . Marital status: Divorced    Spouse name: Not on file  . Number of children: 3  . Years of education: Not on file  . Highest education level: 9th grade  Occupational History  . Not on file  Tobacco Use  . Smoking status: Current Every Day Smoker    Packs/day: 1.00    Years: 0.50    Pack years: 0.50    Types: Cigarettes  . Smokeless tobacco: Never Used  Vaping Use  . Vaping Use: Never used  Substance and Sexual Activity  . Alcohol use: No  . Drug use: Not Currently    Types: Heroin, Marijuana    Comment:  smoked marijuana 4 days ago.  hasn't used heroin since 06/01/2014   . Sexual activity: Not Currently    Birth control/protection: Surgical  Other Topics Concern  . Not on file  Social History Narrative  . Not on file   Social Determinants of Health   Financial Resource Strain:   . Difficulty of Paying Living Expenses: Not on file  Food Insecurity: No Food Insecurity  . Worried About Programme researcher, broadcasting/film/video in the Last Year: Never true  . Ran Out of Food in the Last Year: Never true  Transportation Needs: No Transportation Needs  . Lack of Transportation (Medical): No  . Lack of Transportation (Non-Medical): No  Physical Activity:   . Days of Exercise per Week: Not on file  . Minutes of Exercise per Session: Not on file  Stress:   . Feeling of Stress : Not on file  Social Connections:   . Frequency of Communication with Friends and Family: Not on file  . Frequency of Social Gatherings with Friends and Family: Not on file  . Attends Religious Services: Not on file  .  Active Member of Clubs or Organizations: Not on file  . Attends Banker Meetings: Not on file  . Marital Status: Not on file  Intimate Partner Violence:   . Fear of Current or Ex-Partner: Not on file  . Emotionally Abused: Not on file  . Physically Abused: Not on file  . Sexually Abused: Not on file    Outpatient Medications Prior to Visit  Medication Sig Dispense Refill  . CLEOCIN-T 1 % lotion Apply topically. (Patient not taking: Reported on 04/02/2020)    . ibuprofen (ADVIL) 200 MG tablet Take 200 mg by mouth every 6 (six) hours as needed. (Patient not taking: Reported on 04/02/2020)    . methadone (DOLOPHINE) 10 MG/5ML solution Take 130 mg by mouth every other day. 110 mg every other day (Patient not taking: Reported on 04/02/2020)    . mupirocin ointment (BACTROBAN) 2 % Apply 1 application topically 2 (two) times daily. (Patient not taking: Reported on 04/02/2020)    . triamcinolone cream (KENALOG) 0.5 % Apply 1 application topically 2 (two) times daily. (Patient  not taking: Reported on 04/02/2020)    . ondansetron (ZOFRAN) 4 MG tablet Take 1 tablet (4 mg total) by mouth every 8 (eight) hours as needed for nausea or vomiting. 20 tablet 0   No facility-administered medications prior to visit.    Allergies  Allergen Reactions  . Haldol [Haloperidol Decanoate] Other (See Comments)    Hallucinations  . Nitrates, Organic Other (See Comments)    headaches    Review of Systems  Skin: Positive for color change, rash and wound.  All other systems reviewed and are negative.      Objective:    Physical Exam Vitals reviewed.  Constitutional:      Appearance: Nancy Terry is obese.  HENT:     Head: Normocephalic.     Nose: Nose normal.  Cardiovascular:     Rate and Rhythm: Normal rate and regular rhythm.  Pulmonary:     Effort: Pulmonary effort is normal.     Breath sounds: Normal breath sounds.  Abdominal:     General: Bowel sounds are normal.     Palpations: Abdomen is soft.  Musculoskeletal:        General: Normal range of motion.     Cervical back: Normal range of motion and neck supple.  Skin:    General: Skin is warm and dry.  Neurological:     Mental Status: Nancy Terry is alert and oriented to person, place, and time.  Psychiatric:        Mood and Affect: Mood normal.        Behavior: Behavior normal.        Thought Content: Thought content normal.        Judgment: Judgment normal.     BP 118/60 (BP Location: Left Arm, Patient Position: Sitting, Cuff Size: Large)   Pulse (!) 59   Temp (!) 97.5 F (36.4 C) (Temporal)   Ht 5\' 4"  (1.626 m)   Wt 220 lb 9.6 oz (100.1 kg)   LMP 03/27/2020   SpO2 99%   BMI 37.87 kg/m  Wt Readings from Last 3 Encounters:  04/02/20 220 lb 9.6 oz (100.1 kg)  01/25/20 233 lb 2 oz (105.7 kg)  10/10/19 228 lb 6.4 oz (103.6 kg)    There are no preventive care reminders to display for this patient.  There are no preventive care reminders to display for this patient.   Lab Results  Component Value Date  TSH 1.850 03/27/2018   Lab Results  Component Value Date   WBC 6.1 03/27/2020   HGB 9.9 (L) 03/27/2020   HCT 34.4 (L) 03/27/2020   MCV 79.1 (L) 03/27/2020   PLT 366 03/27/2020   Lab Results  Component Value Date   NA 140 03/27/2020   K 3.4 (L) 03/27/2020   CO2 23 03/27/2020   GLUCOSE 92 03/27/2020   BUN 5 (L) 03/27/2020   CREATININE 0.74 03/27/2020   BILITOT 0.2 (L) 10/09/2019   ALKPHOS 76 10/09/2019   AST 23 10/09/2019   ALT 31 10/09/2019   PROT 7.6 10/09/2019   ALBUMIN 3.8 10/09/2019   CALCIUM 8.7 (L) 03/27/2020   ANIONGAP 9 03/27/2020   Lab Results  Component Value Date   CHOL 102 03/22/2017   Lab Results  Component Value Date   HDL 24 (L) 03/22/2017   Lab Results  Component Value Date   LDLCALC 52 03/22/2017   Lab Results  Component Value Date   TRIG 130 03/22/2017   Lab Results  Component Value Date   CHOLHDL 4.3 03/22/2017   Lab Results  Component Value Date   HGBA1C 5.3 06/14/2014       Assessment & Plan:  Michal was seen today for back pain and blister.  Diagnoses and all orders for this visit:  Need for immunization against influenza CDC recommends influenza vaccine yearly.   -     Flu Vaccine QUAD 36+ mos IM  Post-traumatic wound infection      -     cephALEXin (KEFLEX) 500 MG capsule; Take 1 capsule (500 mg total) by mouth 3 (three) times daily. -     fluconazole (DIFLUCAN) 150 MG tablet; Take 1 tablet (150 mg total) by mouth once for 1 dose.    Meds ordered this encounter  Medications  . cephALEXin (KEFLEX) 500 MG capsule    Sig: Take 1 capsule (500 mg total) by mouth 3 (three) times daily.    Dispense:  21 capsule    Refill:  0  . fluconazole (DIFLUCAN) 150 MG tablet    Sig: Take 1 tablet (150 mg total) by mouth once for 1 dose.    Dispense:  1 tablet    Refill:  0     Grayce Sessions, NP

## 2020-04-02 NOTE — Patient Instructions (Signed)
Wound Care, Adult Taking care of your wound properly can help to prevent pain, infection, and scarring. It can also help your wound to heal more quickly. How to care for your wound Wound care      Follow instructions from your health care provider about how to take care of your wound. Make sure you: ? Wash your hands with soap and water before you change the bandage (dressing). If soap and water are not available, use hand sanitizer. ? Change your dressing as told by your health care provider. ? Leave stitches (sutures), skin glue, or adhesive strips in place. These skin closures may need to stay in place for 2 weeks or longer. If adhesive strip edges start to loosen and curl up, you may trim the loose edges. Do not remove adhesive strips completely unless your health care provider tells you to do that.  Check your wound area every day for signs of infection. Check for: ? Redness, swelling, or pain. ? Fluid or blood. ? Warmth. ? Pus or a bad smell.  Ask your health care provider if you should clean the wound with mild soap and water. Doing this may include: ? Using a clean towel to pat the wound dry after cleaning it. Do not rub or scrub the wound. ? Applying a cream or ointment. Do this only as told by your health care provider. ? Covering the incision with a clean dressing.  Ask your health care provider when you can leave the wound uncovered.  Keep the dressing dry until your health care provider says it can be removed. Do not take baths, swim, use a hot tub, or do anything that would put the wound underwater until your health care provider approves. Ask your health care provider if you can take showers. You may only be allowed to take sponge baths. Medicines   If you were prescribed an antibiotic medicine, cream, or ointment, take or use the antibiotic as told by your health care provider. Do not stop taking or using the antibiotic even if your condition improves.  Take  over-the-counter and prescription medicines only as told by your health care provider. If you were prescribed pain medicine, take it 30 or more minutes before you do any wound care or as told by your health care provider. General instructions  Return to your normal activities as told by your health care provider. Ask your health care provider what activities are safe.  Do not scratch or pick at the wound.  Do not use any products that contain nicotine or tobacco, such as cigarettes and e-cigarettes. These may delay wound healing. If you need help quitting, ask your health care provider.  Keep all follow-up visits as told by your health care provider. This is important.  Eat a diet that includes protein, vitamin A, vitamin C, and other nutrient-rich foods to help the wound heal. ? Foods rich in protein include meat, dairy, beans, nuts, and other sources. ? Foods rich in vitamin A include carrots and dark green, leafy vegetables. ? Foods rich in vitamin C include citrus, tomatoes, and other fruits and vegetables. ? Nutrient-rich foods have protein, carbohydrates, fat, vitamins, or minerals. Eat a variety of healthy foods including vegetables, fruits, and whole grains. Contact a health care provider if:  You received a tetanus shot and you have swelling, severe pain, redness, or bleeding at the injection site.  Your pain is not controlled with medicine.  You have redness, swelling, or pain around the wound.    You have fluid or blood coming from the wound.  Your wound feels warm to the touch.  You have pus or a bad smell coming from the wound.  You have a fever or chills.  You are nauseous or you vomit.  You are dizzy. Get help right away if:  You have a red streak going away from your wound.  The edges of the wound open up and separate.  Your wound is bleeding, and the bleeding does not stop with gentle pressure.  You have a rash.  You faint.  You have trouble  breathing. Summary  Always wash your hands with soap and water before changing your bandage (dressing).  To help with healing, eat foods that are rich in protein, vitamin A, vitamin C, and other nutrients.  Check your wound every day for signs of infection. Contact your health care provider if you suspect that your wound is infected. This information is not intended to replace advice given to you by your health care provider. Make sure you discuss any questions you have with your health care provider. Document Revised: 09/11/2018 Document Reviewed: 12/09/2015 Elsevier Patient Education  2020 Elsevier Inc.  

## 2020-04-09 ENCOUNTER — Ambulatory Visit: Payer: Medicaid Other

## 2020-04-17 ENCOUNTER — Ambulatory Visit: Payer: Self-pay

## 2020-04-17 NOTE — Telephone Encounter (Signed)
Pt. States she saw Ms. Edwards 04/02/20 for "a sore on my right ankle. She put me on antibiotics and it's not getting better." Spoke with Mallory in the practice and pt. Given appointment. Instructed if area changes before appointment, call back. Reason for Disposition . [1] Using antibiotic ointment > 1 week AND [2] sore not completely healed  Answer Assessment - Initial Assessment Questions 1. APPEARANCE of SORES: "What do the sores look like?"     Open, raw 2. NUMBER: "How many sores are there?"     One 3. SIZE: "How big is the largest sore?"     Silver dollar 4. LOCATION: "Where are the sores located?"     Right ankle inside 5. ONSET: "When did the sores begin?"     3 weeks 6. CAUSE: "What do you think is causing the sores?"     Blister 7. OTHER SYMPTOMS: "Do you have any other symptoms?" (e.g., fever, new weakness)     No  Protocols used: SORES-A-AH

## 2020-04-28 ENCOUNTER — Ambulatory Visit (INDEPENDENT_AMBULATORY_CARE_PROVIDER_SITE_OTHER): Payer: Medicaid Other | Admitting: Primary Care

## 2020-04-28 ENCOUNTER — Telehealth: Payer: Self-pay | Admitting: Family Medicine

## 2020-04-28 ENCOUNTER — Ambulatory Visit: Payer: Self-pay

## 2020-04-28 NOTE — Telephone Encounter (Signed)
Patient is calling back to check on the below request Cb- [phone] Preferred Pharmacy- [facility]

## 2020-04-28 NOTE — Telephone Encounter (Signed)
Patient called to place on my schedule for today but unable to reach patient.

## 2020-04-28 NOTE — Telephone Encounter (Signed)
Copied from CRM [id number]. Topic: General - Other >> Apr 28, 2020  1:58 PM Dalphine Handing A wrote: Patient is requesting that antibiotics, and medication for her itching and pain be called into pharmacy. Please advise

## 2020-04-28 NOTE — Telephone Encounter (Signed)
Will route to PCP for review. 

## 2020-04-28 NOTE — Telephone Encounter (Signed)
Pt states that she has a bol and has taken antibiotics and nothing has worked. Pt had appointment today with edwards but appointment was rescheduled.  Patient is requesting antibiotics and medication for itching

## 2020-04-28 NOTE — Telephone Encounter (Signed)
Patient called and says she has a boil that is open on her right lower leg that has been ongoing for a month. She says she's been on several antibiotics and nothing is seeming to make it go away. She says she has gone to the UC and they told her to follow up with her doctor, but no appointments were available. She says she had an appointment today, but was 8 minutes late and had to reschedule. She says she's in pain and it's hard to walk due to the pain. She asks to be seen by any provider at [facility] today or this week. I advised no appointments and to go to the UC, she verbalized understanding.  Reason for Disposition . [1] Boil > 1/2 inch across (> 12 mm; larger than a marble) AND [2] center is soft or pus colored  Answer Assessment - Initial Assessment Questions 1. APPEARANCE of BOIL: "What does the boil look like?"      Looks like a burn after the blister burst 2. LOCATION: "Where is the boil located?"      Right leg near ankle up to calf  3. NUMBER: "How many boils are there?"      1 area 4. SIZE: "How big is the boil?" (e.g., inches, cm; compare to size of a coin or other object)     From ankle up to calf 5. ONSET: "When did the boil start?"     Almost 1 month ago 6. PAIN: "Is there any pain?" If Yes, ask: "How bad is the pain?"   (Scale 1-10; or mild, moderate, severe)     6 7. FEVER: "Do you have a fever?" If Yes, ask: "What is it, how was it measured, and when did it start?"      No 8. SOURCE: "Have you been around anyone with boils or other Staph infections?" "Have you ever had boils before?"     N/A 9. OTHER SYMPTOMS: "Do you have any other symptoms?" (e.g., shaking chills, weakness, rash elsewhere on body)     Leg is swollen 10. PREGNANCY: "Is there any chance you are pregnant?" "When was your last menstrual period?"       N/A  Protocols used: BOIL (SKIN ABSCESS)-A-AH

## 2020-04-29 ENCOUNTER — Ambulatory Visit: Payer: Self-pay | Attending: Family Medicine | Admitting: Family

## 2020-04-29 ENCOUNTER — Other Ambulatory Visit: Payer: Self-pay

## 2020-04-29 NOTE — Progress Notes (Signed)
Patient arrived to [facility] with c/o RLE skin breakdown. Breakdown worsening over 1 month and nothing is helping. Per patient, she has had 2 rounds of ATB.  Leg pain 7/10

## 2020-04-29 NOTE — Progress Notes (Signed)
Patient complains of cellulitis for the past month in the R LE.Marland Kitchen Patient complains of no relief with antibiotics. Patient complains of being fatigue with home life and being a caretaker of her grand children and mother.

## 2020-04-29 NOTE — Progress Notes (Signed)
Per patient preference patient left before provider was able to see her. Says she plans to reschedule soon.

## 2020-05-02 ENCOUNTER — Other Ambulatory Visit: Payer: Self-pay

## 2020-05-02 ENCOUNTER — Ambulatory Visit (HOSPITAL_COMMUNITY)
Admission: EM | Admit: 2020-05-02 | Discharge: 2020-05-02 | Disposition: A | Discharge status: Home / Self Care | Payer: Medicaid Other | Attending: Family Medicine | Admitting: Family Medicine

## 2020-05-02 ENCOUNTER — Encounter (HOSPITAL_COMMUNITY): Payer: Self-pay

## 2020-05-02 DIAGNOSIS — L98491 Non-pressure chronic ulcer of skin of other sites limited to breakdown of skin: Secondary | ICD-10-CM

## 2020-05-02 DIAGNOSIS — R600 Localized edema: Secondary | ICD-10-CM

## 2020-05-02 DIAGNOSIS — L03115 Cellulitis of right lower limb: Secondary | ICD-10-CM

## 2020-05-02 MED ORDER — SULFAMETHOXAZOLE-TRIMETHOPRIM 800-160 MG PO TABS: 1.0000 | ORAL_TABLET | Freq: Two times a day (BID) | ORAL | 0 refills | Status: AC

## 2020-05-02 MED ORDER — SILVER SULFADIAZINE 1 % EX CREA: TOPICAL_CREAM | Freq: Once | CUTANEOUS | Status: AC

## 2020-05-02 MED ORDER — SILVER SULFADIAZINE 1 % EX CREA: 1.0000 "application " | TOPICAL_CREAM | Freq: Every day | CUTANEOUS | 0 refills | Status: DC

## 2020-05-02 MED ORDER — ACETAMINOPHEN-CODEINE #3 300-30 MG PO TABS: 1.0000 | ORAL_TABLET | Freq: Four times a day (QID) | ORAL | 0 refills | Status: DC | PRN

## 2020-05-02 MED ORDER — FUROSEMIDE 20 MG PO TABS: 20.0000 mg | ORAL_TABLET | Freq: Every day | ORAL | 0 refills | Status: DC

## 2020-05-02 NOTE — ED Provider Notes (Signed)
[facility]    CSN: [id number] Arrival date & time: 05/02/20  1428      History   Chief Complaint Chief Complaint  Patient presents with  . Wound Check    wound on right leg x 1 month    HPI Nancy Terry is a 44 y.o. female.   HPI   Patient has a nonhealing wound on her right lower leg.  She has been seen by her primary care.  There is photographs of this from a month ago.  She was treated with antibiotics.  She is uncertain what type.  She took 21 days of antibiotics.  She states that she stopped several days ago.  She now has increased redness drainage and pustules on her leg.  She states that she was not provided any advice on how to care for this wound.  She comes in with toilet paper stuck on the leg.  There is honey crusting.  Pustules in a satellite fashion around the wound.  Redness of the lower leg and soft tissue swelling from the knee down.  No fever.  No history of diabetes.  Past Medical History:  Diagnosis Date  . Anxiety   . Depression   . Hepatitis C   . Herpes infection   . Hypertension   . Kidney stones   . Migraines   . Narcotic abuse [facility])     Patient Active Problem List   Diagnosis Date Noted  . Hidradenitis 10/10/2019  . Tobacco use 10/10/2019  . Chronic pain syndrome 04/11/2018  . Complex ovarian cyst 11/21/2017  . Hypertension 11/21/2017  . Menorrhagia 05/13/2017  . Sciatica 05/13/2017  . Migraines 05/07/2016  . Hepatitis C 05/07/2016  . Depression 04/05/2016  . Anxiety and depression 04/05/2016  . Narcotic abuse (HCC) 04/05/2016  . Breast lump on right side at 10 o'clock position 03/12/2014  . Pap smear of cervix shows high risk HPV present 03/12/2014    Past Surgical History:  Procedure Laterality Date  . BREAST BIOPSY    . ELBOW SURGERY Left   . KIDNEY STONE SURGERY     with stent placement  . LITHOTRIPSY    . TUBAL LIGATION      OB History    Gravida  3   Para  3   Term  3   Preterm      AB      Living    3     SAB      TAB      Ectopic      Multiple      Live Births               Home Medications    Prior to Admission medications   Medication Sig Start Date End Date Taking? Authorizing Provider  ibuprofen (ADVIL) 200 MG tablet Take 200 mg by mouth every 6 (six) hours as needed.    Yes [provider]  methadone (DOLOPHINE) 10 MG/5ML solution Take 130 mg by mouth every other day. 110 mg every other day   Yes [provider]  acetaminophen-codeine (TYLENOL #3) 300-30 MG tablet Take 1-2 tablets by mouth every 6 (six) hours as needed for moderate pain. 05/02/20   Eustace Moore, MD  furosemide (LASIX) 20 MG tablet Take 1 tablet (20 mg total) by mouth daily. 05/02/20   Eustace Moore, MD  silver sulfADIAZINE (SILVADENE) 1 % cream Apply 1 application topically daily. 05/02/20   Eustace Moore, MD  sulfamethoxazole-trimethoprim (BACTRIM DS) 800-160 MG tablet Take 1 tablet by mouth 2 (two) times daily for 7 days. 05/02/20 05/09/20  Eustace Moore, MD    Family History Family History  Problem Relation Age of Onset  . Parkinson's disease Mother   . Hypertension Mother   . Rheum arthritis Mother   . Heart failure Mother   . Parkinson's disease Maternal Grandmother   . Hypertension Maternal Grandmother   . Glaucoma Maternal Grandmother   . Lung cancer Maternal Grandmother   . Colon cancer Neg Hx   . Stomach cancer Neg Hx   . Pancreatic cancer Neg Hx   . Esophageal cancer Neg Hx     Social History Social History   Tobacco Use  . Smoking status: Current Every Day Smoker    Packs/day: 1.00    Years: 0.50    Pack years: 0.50    Types: Cigarettes  . Smokeless tobacco: Never Used  Vaping Use  . Vaping Use: Never used  Substance Use Topics  . Alcohol use: No  . Drug use: Not Currently    Types: Heroin, Marijuana    Comment:  smoked marijuana 4 days ago.  hasn't used heroin since 06/01/2014     Allergies   Haldol [haloperidol  decanoate] and Nitrates, organic   Review of Systems Review of Systems See HPI  Physical Exam Triage Vital Signs ED Triage Vitals  Enc Vitals Group     BP 05/02/20 1547 (!) 148/62     Pulse Rate 05/02/20 1547 64     Resp 05/02/20 1547 18     Temp 05/02/20 1547 98.1 F (36.7 C)     Temp Source 05/02/20 1547 Oral     SpO2 05/02/20 1547 100 %     Weight --      Height --      Head Circumference --      Peak Flow --      Pain Score 05/02/20 1549 5     Pain Loc --      Pain Edu? --      Excl. in GC? --    No data found.  Updated Vital Signs BP (!) 148/62 (BP Location: Right Arm)   Pulse 64   Temp 98.1 F (36.7 C) (Oral)   Resp 18   LMP 04/24/2020 (Approximate)   SpO2 100%       Physical Exam Constitutional:      General: She is not in acute distress.    Appearance: She is well-developed.  HENT:     Head: Normocephalic and atraumatic.  Eyes:     Conjunctiva/sclera: Conjunctivae normal.     Pupils: Pupils are equal, round, and reactive to light.  Cardiovascular:     Rate and Rhythm: Normal rate.  Pulmonary:     Effort: Pulmonary effort is normal. No respiratory distress.  Abdominal:     Palpations: Abdomen is soft.  Musculoskeletal:        General: Normal range of motion.     Cervical back: Normal range of motion.  Skin:    General: Skin is warm and dry.     Comments: Patient has large ulcer on the posterior calf on the right leg that measures 7 x 10 cm.  Superficial.  Surrounding pustules.  The lower leg is erythematous.  2+ pitting edema on the right with 1+ on the left  Neurological:     General: No focal deficit present.     Mental Status: She is alert.  Psychiatric:        Mood and Affect: Mood normal.      UC Treatments / Results  Labs (all labs ordered are listed, but only abnormal results are displayed) Labs Reviewed - No data to display  EKG   Radiology No results found.  Procedures Procedures (including critical care  time)  Medications Ordered in UC Medications  silver sulfADIAZINE (SILVADENE) 1 % cream ( Topical Given 05/02/20 1634)    Initial Impression / Assessment and Plan / UC Course  I have reviewed the triage vital signs and the nursing notes.  Pertinent labs & imaging results that were available during my care of the patient were reviewed by me and considered in my medical decision making (see chart for details).     I explained to the patient that the edema mostly managed note for the wound to heal.  I will put her on Lasix and potassium.  Elevate leg. I am also going to have her do Silvadene dressing changes daily. I am placing her on Septra DS 1 p.o. twice daily I am referring her to the wound clinic I told her to call her primary care doctor on Monday.  At home, the wound clinic will see her but she needs ongoing medical care for direction and follow-up Final Clinical Impressions(s) / UC Diagnoses   Final diagnoses:  Skin ulcer, limited to breakdown of skin (HCC)  Pedal edema  Cellulitis of right lower extremity     Discharge Instructions     Wash daily and apply silvadene Keep leg elevated Take the lasix once a day, this is a fluid pill Take an over the counter potassium pill Take the antibiotic 2 x a day Wound clinic should call you next week     ED Prescriptions    Medication Sig Dispense Auth. Provider   furosemide (LASIX) 20 MG tablet Take 1 tablet (20 mg total) by mouth daily. 15 tablet Eustace Moore, MD   silver sulfADIAZINE (SILVADENE) 1 % cream Apply 1 application topically daily. 50 g Eustace Moore, MD   acetaminophen-codeine (TYLENOL #3) 300-30 MG tablet Take 1-2 tablets by mouth every 6 (six) hours as needed for moderate pain. 15 tablet Eustace Moore, MD   sulfamethoxazole-trimethoprim (BACTRIM DS) 800-160 MG tablet Take 1 tablet by mouth 2 (two) times daily for 7 days. 14 tablet Eustace Moore, MD     I have reviewed the PDMP during this  encounter.   Eustace Moore, MD 05/02/20 [phone]

## 2020-05-02 NOTE — ED Triage Notes (Signed)
Patient states she has had the wound for about a month and has been seen 2 times about it by another physician. Pt states the wound is getting larger and is burning. Pt also states wound is draining regularly. Pt is aox4 and ambulates with a limp.

## 2020-05-02 NOTE — Discharge Instructions (Signed)
Wash daily and apply silvadene Keep leg elevated Take the lasix once a day, this is a fluid pill Take an over the counter potassium pill Take the antibiotic 2 x a day Wound clinic should call you next week

## 2020-05-09 ENCOUNTER — Ambulatory Visit (INDEPENDENT_AMBULATORY_CARE_PROVIDER_SITE_OTHER): Payer: Medicaid Other | Admitting: Primary Care

## 2020-05-11 NOTE — Progress Notes (Deleted)
   Subjective:    Patient ID: Nancy Terry, female    DOB: 1976/04/17, 44 y.o.   MRN: [id number]  HPI    Review of Systems     Objective:   Physical Exam        Assessment & Plan:

## 2020-05-12 ENCOUNTER — Ambulatory Visit: Payer: Medicaid Other | Admitting: Critical Care Medicine

## 2020-05-12 ENCOUNTER — Telehealth: Payer: Self-pay | Admitting: Family Medicine

## 2020-05-12 NOTE — Telephone Encounter (Signed)
Copied from CRM [id number]. Topic: General - Call Back - No Documentation >> May 12, 2020  4:18 PM Randol Kern wrote: Reason for CRM: Pt called and is requesting a call back from [facility] regarding orange card renewal.   Best contact: [phone] or [phone]

## 2020-05-12 NOTE — Telephone Encounter (Signed)
I return Pt call, LVM to call me back to see how I can help her

## 2020-05-13 ENCOUNTER — Ambulatory Visit (INDEPENDENT_AMBULATORY_CARE_PROVIDER_SITE_OTHER): Payer: Medicaid Other | Admitting: Primary Care

## 2020-05-14 MED FILL — PROMETHAZINE HCL 25 MG TABS: 25 | 10 days supply | Qty: 30 | Fill #1

## 2020-05-22 ENCOUNTER — Ambulatory Visit: Payer: Medicaid Other | Admitting: Pharmacist

## 2020-05-26 MED FILL — AMOXICILLIN 500 MG CAPSULE: 500 | 14 days supply | Qty: 28 | Fill #1

## 2020-05-28 MED FILL — PROMETHAZINE HCL 25 MG TABS: 25 | 10 days supply | Qty: 30 | Fill #1

## 2020-06-04 ENCOUNTER — Ambulatory Visit: Payer: Medicaid Other

## 2020-06-05 ENCOUNTER — Encounter (HOSPITAL_COMMUNITY): Payer: Self-pay

## 2020-06-12 ENCOUNTER — Ambulatory Visit: Payer: Medicaid Other | Admitting: Pharmacist

## 2020-06-17 ENCOUNTER — Ambulatory Visit: Payer: Medicaid Other | Admitting: Family Medicine

## 2020-06-23 ENCOUNTER — Encounter (HOSPITAL_BASED_OUTPATIENT_CLINIC_OR_DEPARTMENT_OTHER): Payer: Self-pay | Admitting: Internal Medicine

## 2020-06-24 ENCOUNTER — Encounter (HOSPITAL_BASED_OUTPATIENT_CLINIC_OR_DEPARTMENT_OTHER): Payer: Medicaid Other | Admitting: Internal Medicine

## 2020-07-01 ENCOUNTER — Encounter: Payer: Medicaid Other | Admitting: Internal Medicine

## 2020-07-07 ENCOUNTER — Ambulatory Visit: Payer: Medicaid Other | Admitting: Family Medicine

## 2020-07-18 ENCOUNTER — Encounter (HOSPITAL_BASED_OUTPATIENT_CLINIC_OR_DEPARTMENT_OTHER): Payer: Medicaid Other | Admitting: Internal Medicine

## 2020-07-25 ENCOUNTER — Encounter (HOSPITAL_BASED_OUTPATIENT_CLINIC_OR_DEPARTMENT_OTHER): Payer: Medicaid Other | Admitting: Internal Medicine

## 2020-07-31 ENCOUNTER — Ambulatory Visit (HOSPITAL_COMMUNITY): Payer: No Payment, Other | Admitting: Licensed Clinical Social Worker

## 2020-07-31 ENCOUNTER — Ambulatory Visit (HOSPITAL_COMMUNITY): Payer: No Payment, Other | Admitting: Psychiatry

## 2020-08-06 ENCOUNTER — Telehealth: Payer: Self-pay | Admitting: Family Medicine

## 2020-08-06 NOTE — Telephone Encounter (Signed)
Called patient and connected with her apologizing for the late notice but that her appointment for tomorrow had been cancelled and needed to be reschedule.   Patient hung up and I was unable to reschedule. If patient calls back please advise her that the provider is out of the office and we need to get her rescheduled.

## 2020-08-07 ENCOUNTER — Ambulatory Visit: Payer: Medicaid Other | Admitting: Physician Assistant

## 2020-08-08 ENCOUNTER — Ambulatory Visit: Payer: Medicaid Other | Admitting: Internal Medicine

## 2020-08-21 ENCOUNTER — Encounter (HOSPITAL_BASED_OUTPATIENT_CLINIC_OR_DEPARTMENT_OTHER): Payer: Self-pay | Attending: Internal Medicine | Admitting: Internal Medicine

## 2020-08-21 ENCOUNTER — Other Ambulatory Visit: Payer: Self-pay

## 2020-08-21 DIAGNOSIS — I890 Lymphedema, not elsewhere classified: Secondary | ICD-10-CM | POA: Insufficient documentation

## 2020-08-21 DIAGNOSIS — I10 Essential (primary) hypertension: Secondary | ICD-10-CM | POA: Insufficient documentation

## 2020-08-21 DIAGNOSIS — L97828 Non-pressure chronic ulcer of other part of left lower leg with other specified severity: Secondary | ICD-10-CM | POA: Insufficient documentation

## 2020-08-21 DIAGNOSIS — I87332 Chronic venous hypertension (idiopathic) with ulcer and inflammation of left lower extremity: Secondary | ICD-10-CM | POA: Insufficient documentation

## 2020-08-21 DIAGNOSIS — F17200 Nicotine dependence, unspecified, uncomplicated: Secondary | ICD-10-CM | POA: Insufficient documentation

## 2020-08-21 NOTE — Progress Notes (Signed)
[name] ([id number]) , Visit Report for 08/21/2020 Allergy List Details Patient Name: Date of Service: Nancy Terry, [name] 08/21/2020 1:15 PM Medical Record Number: [id number] Patient Account Number: [id number] Date of Birth/Sex: Treating RN: 07/29/1975 (45 y.o. Female) Shawn Stalleaton, Bobbi Primary Care Tristram Milian: Hoy RegisterNewlin, Enobong Other Clinician: Referring Laquinn Shippy: Treating Ivet Guerrieri/Extender: Luster Landsbergobson, Michael Newlin, Odette HornsEnobong Weeks in Treatment: 0 Allergies Active Allergies Haldol Reaction: throat swelling, dizzy Allergy Notes Electronic Signature(s) Signed: 08/21/2020 5:43:09 PM By: Antonieta IbaBarnhart, Jodi Entered By: Antonieta IbaBarnhart, Jodi on 08/21/2020 13:23:45 -------------------------------------------------------------------------------- Arrival Information Details Patient Name: Date of Service: Nancy Terry, [name] 08/21/2020 1:15 PM Medical Record Number: [id number] Patient Account Number: [id number] Date of Birth/Sex: Treating RN: 07/29/1975 (45 y.o. Female) Shawn Stalleaton, Bobbi Primary Care Marlea Gambill: Hoy RegisterNewlin, Enobong Other Clinician: Referring Kayton Dunaj: Treating Nataliee Shurtz/Extender: Leotis Painobson, Michael Newlin, Enobong Weeks in Treatment: 0 Visit Information Patient Arrived: Ambulatory Arrival Time: 13:16 Transfer Assistance: None Patient Identification Verified: Yes Secondary Verification Process Completed: Yes Patient Requires Transmission-Based Precautions: No Patient Has Alerts: No Electronic Signature(s) Signed: 08/21/2020 5:43:09 PM By: Antonieta IbaBarnhart, Jodi Entered By: Antonieta IbaBarnhart, Jodi on 08/21/2020 13:21:19 -------------------------------------------------------------------------------- Clinic Level of Care Assessment Details Patient Name: Date of Service: Nancy Terry, [name] 08/21/2020 1:15 PM Medical Record Number: [id number] Patient Account Number: [id number] Date of Birth/Sex: Treating RN: 07/29/1975 (45 y.o. Female) Shawn Stalleaton, Bobbi Primary Care Aleida Crandell: Hoy RegisterNewlin, Enobong Other Clinician: Referring Daniela Siebers: Treating  Johnathon Mittal/Extender: Josephine Cablesobson, Michael Newlin, Enobong Weeks in Treatment: 0 Clinic Level of Care Assessment Items TOOL 1 Quantity Score X- 1 0 Use when EandM and Procedure is performed on INITIAL visit ASSESSMENTS - Nursing Assessment / Reassessment X- 1 20 General Physical Exam (combine w/ comprehensive assessment (listed just below) when performed on new pt. evals) X- 1 25 Comprehensive Assessment (HX, ROS, Risk Assessments, Wounds Hx, etc.) ASSESSMENTS - Wound and Skin Assessment / Reassessment X- 1 10 Dermatologic / Skin Assessment (not related to wound area) ASSESSMENTS - Ostomy and/or Continence Assessment and Care []  - 0 Incontinence Assessment and Management []  - 0 Ostomy Care Assessment and Management (repouching, etc.) PROCESS - Coordination of Care X - Simple Patient / Family Education for ongoing care 1 15 []  - 0 Complex (extensive) Patient / Family Education for ongoing care X- 1 10 Staff obtains ChiropractorConsents, Records, T Results / Process Orders est []  - 0 Staff telephones HHA, Nursing Homes / Clarify orders / etc []  - 0 Routine Transfer to another Facility (non-emergent condition) []  - 0 Routine Hospital Admission (non-emergent condition) X- 1 15 New Admissions / Manufacturing engineernsurance Authorizations / Ordering NPWT Apligraf, etc. , []  - 0 Emergency Hospital Admission (emergent condition) PROCESS - Special Needs []  - 0 Pediatric / Minor Patient Management []  - 0 Isolation Patient Management []  - 0 Hearing / Language / Visual special needs []  - 0 Assessment of Community assistance (transportation, D/C planning, etc.) []  - 0 Additional assistance / Altered mentation []  - 0 Support Surface(s) Assessment (bed, cushion, seat, etc.) INTERVENTIONS - Miscellaneous []  - 0 External ear exam []  - 0 Patient Transfer (multiple staff / Nurse, adultHoyer Lift / Similar devices) []  - 0 Simple Staple / Suture removal (25 or less) []  - 0 Complex Staple / Suture removal (26 or more) []  -  0 Hypo/Hyperglycemic Management (do not check if billed separately) X- 1 15 Ankle / Brachial Index (ABI) - do not check if billed separately Has the patient been seen at the hospital within the last three years: Yes Total Score: 110 Level Of Care: New/Established - Level 3 Electronic Signature(s) Signed: 08/21/2020 5:53:43 PM By:  Deaton, Bobbi Entered By: Shawn Stall on 08/21/2020 14:21:44 -------------------------------------------------------------------------------- Compression Therapy Details Patient Name: Date of Service: [name], Terry 08/21/2020 1:15 PM Medical Record Number: [id number] Patient Account Number: [id number] Date of Birth/Sex: Treating RN: 1976-04-20 (45 y.o. Female) Shawn Stall Primary Care Aveya Beal: Hoy Register Other Clinician: Referring Guilianna Mckoy: Treating  Grosser/Extender: Josephine Cables Weeks in Treatment: 0 Compression Therapy Performed for Wound Assessment: Wound #1 Left,Anterior Lower Leg Performed By: Clinician Zenaida Deed, RN Compression Type: Three Layer Post Procedure Diagnosis Same as Pre-procedure Electronic Signature(s) Signed: 08/21/2020 5:53:43 PM By: Shawn Stall Entered By: Shawn Stall on 08/21/2020 14:21:29 -------------------------------------------------------------------------------- Encounter Discharge Information Details Patient Name: Date of Service: Nancy, Terry 08/21/2020 1:15 PM Medical Record Number: [id number] Patient Account Number: [id number] Date of Birth/Sex: Treating RN: 04/04/1976 (45 y.o. Female) Zenaida Deed Primary Care Sehaj Mcenroe: Hoy Register Other Clinician: Referring Kynli Chou: Treating Cleve Paolillo/Extender: Josephine Cables Weeks in Treatment: 0 Encounter Discharge Information Items Post Procedure Vitals Discharge Condition: Stable Temperature (F): 98.6 Ambulatory Status: Ambulatory Pulse (bpm): 55 Discharge Destination: Home Respiratory Rate (breaths/min):  18 Transportation: Private Auto Blood Pressure (mmHg): 155/76 Accompanied By: self Schedule Follow-up Appointment: Yes Clinical Summary of Care: Patient Declined Electronic Signature(s) Signed: 08/21/2020 5:44:19 PM By: Zenaida Deed RN, BSN Entered By: Zenaida Deed on 08/21/2020 14:44:01 -------------------------------------------------------------------------------- Lower Extremity Assessment Details Patient Name: Date of Service: BRIANTE, LOVEALL 08/21/2020 1:15 PM Medical Record Number: [id number] Patient Account Number: [id number] Date of Birth/Sex: Treating RN: [birth year] (45 y.o. Female) Shawn Stall Primary Care Azeez Dunker: Hoy Register Other Clinician: Referring Cincere Deprey: Treating Dequincy Born/Extender: Josephine Cables Weeks in Treatment: 0 Edema Assessment Assessed: [Left: Yes] [Right: Yes] Edema: [Left: Yes] [Right: Yes] Calf Left: Right: Point of Measurement: 27 cm From Medial Instep 40.8 cm 44 cm Ankle Left: Right: Point of Measurement: 8 cm From Medial Instep 25.5 cm 25 cm Vascular Assessment Pulses: Dorsalis Pedis Palpable: [Left:Yes] [Right:Yes] Blood Pressure: Brachial: [Left:155] [Right:155] Ankle: [Left:Dorsalis Pedis: 170 1.10] [Right:Dorsalis Pedis: 160 1.03] Electronic Signature(s) Signed: 08/21/2020 5:43:09 PM By: Antonieta Iba Signed: 08/21/2020 5:53:43 PM By: Shawn Stall Entered By: Antonieta Iba on 08/21/2020 13:45:42 -------------------------------------------------------------------------------- Multi Wound Chart Details Patient Name: Date of Service: NAKEITHA, MILLIGAN 08/21/2020 1:15 PM Medical Record Number: [id number] Patient Account Number: [id number] Date of Birth/Sex: Treating RN: Apr 08, 1976 (45 y.o. Female) Shawn Stall Primary Care Lismary Kiehn: Hoy Register Other Clinician: Referring Shemeika Starzyk: Treating Trevelle Mcgurn/Extender: Josephine Cables Weeks in Treatment: 0 Vital Signs Height(in): Pulse(bpm):  55 Weight(lbs): Blood Pressure(mmHg): 155/76 Body Mass Index(BMI): Temperature(F): 98.6 Respiratory Rate(breaths/min): 16 Photos: [1:No Photos Left, Anterior Lower Leg] [N/A:N/A N/A] Wound Location: [1:Trauma] [N/A:N/A] Wounding Event: [1:Trauma, Other] [N/A:N/A] Primary Etiology: [1:Hypertension, Hepatitis C] [N/A:N/A] Comorbid History: [1:08/14/2020] [N/A:N/A] Date Acquired: [1:0] [N/A:N/A] Weeks of Treatment: [1:Open] [N/A:N/A] Wound Status: [1:0.5x1.9x0.1] [N/A:N/A] Measurements L x W x D (cm) [1:0.746] [N/A:N/A] A (cm) : rea [1:0.075] [N/A:N/A] Volume (cm) : [1:0.00%] [N/A:N/A] % Reduction in A rea: [1:0.00%] [N/A:N/A] % Reduction in Volume: [1:Full Thickness Without Exposed] [N/A:N/A] Classification: [1:Support Structures Small] [N/A:N/A] Exudate Amount: [1:Serosanguineous] [N/A:N/A] Exudate Type: [1:red, brown] [N/A:N/A] Exudate Color: [1:Distinct, outline attached] [N/A:N/A] Wound Margin: [1:Large (67-100%)] [N/A:N/A] Granulation Amount: [1:Red] [N/A:N/A] Granulation Quality: [1:Small (1-33%)] [N/A:N/A] Necrotic Amount: [1:Fat Layer (Subcutaneous Tissue): Yes N/A] Exposed Structures: [1:Fascia: No Tendon: No Muscle: No Joint: No Bone: No Debridement - Excisional] [N/A:N/A] Debridement: Pre-procedure Verification/Time Out 14:10 [N/A:N/A] Taken: [1:Other] [N/A:N/A] Pain Control: [1:Subcutaneous, Slough] [N/A:N/A] Tissue Debrided: [1:Skin/Subcutaneous Tissue] [N/A:N/A] Level: [1:2] [N/A:N/A] Debridement A (sq cm): [1:rea Curette] [  N/A:N/A] Instrument: [1:Minimum] [N/A:N/A] Bleeding: [1:Pressure] [N/A:N/A] Hemostasis A chieved: [1:0] [N/A:N/A] Procedural Pain: [1:0] [N/A:N/A] Post Procedural Pain: [1:Procedure was tolerated well] [N/A:N/A] Debridement Treatment Response: [1:0.5x1.9x0.1] [N/A:N/A] Post Debridement Measurements L x W x D (cm) [1:0.075] [N/A:N/A] Post Debridement Volume: (cm) [1:Periwound erythema] [N/A:N/A] Assessment Notes: [1:Compression  Therapy] [N/A:N/A] Procedures Performed: [1:Debridement] Treatment Notes Electronic Signature(s) Signed: 08/21/2020 5:46:09 PM By: Baltazar Najjar MD Signed: 08/21/2020 5:53:43 PM By: Shawn Stall Entered By: Baltazar Najjar on 08/21/2020 14:31:43 -------------------------------------------------------------------------------- Multi-Disciplinary Care Plan Details Patient Name: Date of Service: ALICE, BURNSIDE 08/21/2020 1:15 PM Medical Record Number: [id number] Patient Account Number: [id number] Date of Birth/Sex: Treating RN: 1976-04-19 (45 y.o. Female) Shawn Stall Primary Care Adarryl Goldammer: Hoy Register Other Clinician: Referring Paitlyn Mcclatchey: Treating Shivansh Hardaway/Extender: Josephine Cables Weeks in Treatment: 0 Active Inactive Abuse / Safety / Falls / Self Care Management Nursing Diagnoses: Potential for injury related to falls Goals: Patient will remain injury free related to falls Date Initiated: 08/21/2020 Target Resolution Date: 10/02/2020 Goal Status: Active Patient/caregiver will verbalize understanding of skin care regimen Date Initiated: 08/21/2020 Target Resolution Date: 09/12/2020 Goal Status: Active Interventions: Provide education on basic hygiene Notes: Nutrition Nursing Diagnoses: Potential for alteratiion in Nutrition/Potential for imbalanced nutrition Goals: Patient/caregiver agrees to and verbalizes understanding of need to obtain nutritional consultation Date Initiated: 08/21/2020 Target Resolution Date: 09/12/2020 Goal Status: Active Interventions: Provide education on nutrition Treatment Activities: Patient referred to Primary Care Physician for further nutritional evaluation : 08/21/2020 Notes: Orientation to the Wound Care Program Nursing Diagnoses: Knowledge deficit related to the wound healing center program Goals: Patient/caregiver will verbalize understanding of the Wound Healing Center Program Date Initiated: 08/21/2020 Target  Resolution Date: 09/12/2020 Goal Status: Active Interventions: Provide education on orientation to the wound center Notes: Wound/Skin Impairment Nursing Diagnoses: Knowledge deficit related to ulceration/compromised skin integrity Goals: Patient/caregiver will verbalize understanding of skin care regimen Date Initiated: 08/21/2020 Target Resolution Date: 09/12/2020 Goal Status: Active Interventions: Assess patient/caregiver ability to perform ulcer/skin care regimen upon admission and as needed Assess ulceration(s) every visit Provide education on ulcer and skin care Treatment Activities: Skin care regimen initiated : 08/21/2020 Topical wound management initiated : 08/21/2020 Notes: Electronic Signature(s) Signed: 08/21/2020 5:53:43 PM By: Shawn Stall Entered By: Shawn Stall on 08/21/2020 14:13:01 -------------------------------------------------------------------------------- Pain Assessment Details Patient Name: Date of Service: LEGEND, PECORE 08/21/2020 1:15 PM Medical Record Number: [id number] Patient Account Number: [id number] Date of Birth/Sex: Treating RN: Jun 09, 1975 (45 y.o. Female) Shawn Stall Primary Care Nakia Remmers: Hoy Register Other Clinician: Referring Hasset Chaviano: Treating Jacia Sickman/Extender: Josephine Cables Weeks in Treatment: 0 Active Problems Location of Pain Severity and Description of Pain Patient Has Paino Yes Site Locations Rate the pain. Rate the pain. Current Pain Level: 3 Worst Pain Level: 5 Character of Pain Describe the Pain: Aching, Tender Pain Management and Medication Current Pain Management: Medication: Yes Cold Application: No Rest: Yes Massage: No Activity: No T.E.N.S.: No Heat Application: No Leg drop or elevation: No How does your wound impact your activities of daily livingo Sleep: Yes Bathing: No Appetite: No Relationship With Others: No Bladder Continence: No Emotions: No Bowel Continence: No Work:  No Toileting: No Drive: No Dressing: No Hobbies: No Electronic Signature(s) Signed: 08/21/2020 5:43:09 PM By: Antonieta Iba Signed: 08/21/2020 5:53:43 PM By: Shawn Stall Entered By: Antonieta Iba on 08/21/2020 13:48:58 -------------------------------------------------------------------------------- Patient/Caregiver Education Details Patient Name: Date of Service: LASHAE, WOLLENBERG 3/17/2022andnbsp1:15 PM Medical Record Number: [id number] Patient Account Number: [id number] Date of Birth/Gender: Treating RN: [birth year] (45 y.o. Female)  Shawn Stall Primary Care Physician: Hoy Register Other Clinician: Referring Physician: Treating Physician/Extender: Leotis Pain in Treatment: 0 Education Assessment Education Provided To: Patient Education Topics Provided Nutrition: Handouts: Nutrition Methods: Explain/Verbal, Printed Responses: Reinforcements needed Welcome [facility]: o Handouts: Welcome T [facility] o Methods: Explain/Verbal, Printed Responses: Reinforcements needed Electronic Signature(s) Signed: 08/21/2020 5:53:43 PM By: Shawn Stall Entered By: Shawn Stall on 08/21/2020 14:13:29 -------------------------------------------------------------------------------- Wound Assessment Details Patient Name: Date of Service: MATEJA, DIER 08/21/2020 1:15 PM Medical Record Number: [id number] Patient Account Number: [id number] Date of Birth/Sex: Treating RN: Jan 03, 1976 (45 y.o. Female) Shawn Stall Primary Care Doneen Ollinger: Hoy Register Other Clinician: Referring Makhia Vosler: Treating Francois Elk/Extender: Josephine Cables Weeks in Treatment: 0 Wound Status Wound Number: 1 Primary Etiology: Trauma, Other Wound Location: Left, Anterior Lower Leg Wound Status: Open Wounding Event: Trauma Comorbid History: Hypertension, Hepatitis C Date Acquired: 08/14/2020 Weeks Of Treatment: 0 Clustered Wound:  No Photos Wound Measurements Length: (cm) 0.5 Width: (cm) 1.9 Depth: (cm) 0.1 Area: (cm) 0.746 Volume: (cm) 0.075 % Reduction in Area: 0% % Reduction in Volume: 0% Tunneling: No Undermining: No Wound Description Classification: Full Thickness Without Exposed Support Struc Wound Margin: Distinct, outline attached Exudate Amount: Small Exudate Type: Serosanguineous Exudate Color: red, brown tures Foul Odor After Cleansing: No Slough/Fibrino Yes Wound Bed Granulation Amount: Large (67-100%) Exposed Structure Granulation Quality: Red Fascia Exposed: No Necrotic Amount: Small (1-33%) Fat Layer (Subcutaneous Tissue) Exposed: Yes Necrotic Quality: Adherent Slough Tendon Exposed: No Muscle Exposed: No Joint Exposed: No Bone Exposed: No Assessment Notes Periwound erythema Treatment Notes Wound #1 (Lower Leg) Wound Laterality: Left, Anterior Cleanser Soap and Water Discharge Instruction: May shower and wash wound with dial antibacterial soap and water prior to dressing change. Peri-Wound Care Triamcinolone 15 (g) Discharge Instruction: Use triamcinolone 15 (g) as directed Sween Lotion (Moisturizing lotion) Discharge Instruction: Apply moisturizing lotion as directed Topical Primary Dressing KerraCel Ag Gelling Fiber Dressing, 2x2 in (silver alginate) Discharge Instruction: Apply silver alginate to wound bed as instructed Secondary Dressing Woven Gauze Sponge, Non-Sterile 4x4 in Discharge Instruction: Apply over primary dressing as directed. ABD Pad, 8x10 Discharge Instruction: Apply over primary dressing as directed. Secured With Compression Wrap ThreePress (3 layer compression wrap) Discharge Instruction: Apply three layer compression as directed. Compression Stockings Add-Ons Electronic Signature(s) Signed: 08/21/2020 5:41:28 PM By: Karl Ito Signed: 08/21/2020 5:53:43 PM By: Shawn Stall Previous Signature: 08/21/2020 1:54:57 PM Version By: Antonieta Iba Entered By: Karl Ito on 08/21/2020 17:39:35 -------------------------------------------------------------------------------- Vitals Details Patient Name: Date of Service: SAMAYRA, HEBEL 08/21/2020 1:15 PM Medical Record Number: [id number] Patient Account Number: [id number] Date of Birth/Sex: Treating RN: Jun 15, 1975 (45 y.o. Female) Shawn Stall Primary Care Ivannah Zody: Hoy Register Other Clinician: Referring Vila Dory: Treating Chariah Bailey/Extender: Josephine Cables Weeks in Treatment: 0 Vital Signs Time Taken: 13:21 Temperature (F): 98.6 Pulse (bpm): 55 Respiratory Rate (breaths/min): 16 Blood Pressure (mmHg): 155/76 Reference Range: 80 - 120 mg / dl Electronic Signature(s) Signed: 08/21/2020 5:43:09 PM By: Antonieta Iba Entered By: Antonieta Iba on 08/21/2020 13:21:50

## 2020-08-21 NOTE — Progress Notes (Signed)
Nancy Terry ([id number]) , Visit Report for 08/21/2020 Abuse/Suicide Risk Screen Details Patient Name: Date of Service: Nancy Terry, Nancy Terry 08/21/2020 1:15 PM Medical Record Number: [id number] Patient Account Number: [id number] Date of Birth/Sex: Treating RN: 1975-11-08 (45 y.o. Female) Shawn Stall Primary Care Daziya Redmond: Hoy Register Other Clinician: Referring Gia Lusher: Treating Kodee Ravert/Extender: Josephine Cables Weeks in Treatment: 0 Abuse/Suicide Risk Screen Items Answer ABUSE RISK SCREEN: Has anyone close to you tried to hurt or harm you recentlyo No Do you feel uncomfortable with anyone in your familyo No Has anyone forced you do things that you didnt want to doo No Electronic Signature(s) Signed: 08/21/2020 5:43:09 PM By: Antonieta Iba Signed: 08/21/2020 5:53:43 PM By: Shawn Stall Entered By: Antonieta Iba on 08/21/2020 13:31:41 -------------------------------------------------------------------------------- Activities of Daily Living Details Patient Name: Date of Service: Nancy Terry, Nancy Terry 08/21/2020 1:15 PM Medical Record Number: [id number] Patient Account Number: [id number] Date of Birth/Sex: Treating RN: [birth year] (45 y.o. Female) Shawn Stall Primary Care Hilde Churchman: Hoy Register Other Clinician: Referring Matthew Cina: Treating Lissete Maestas/Extender: Josephine Cables Weeks in Treatment: 0 Activities of Daily Living Items Answer Activities of Daily Living (Please select one for each item) Drive Automobile Completely Able T Medications ake Completely Able Use T elephone Completely Able Care for Appearance Completely Able Use T oilet Completely Able Bath / Shower Completely Able Dress Self Completely Able Feed Self Completely Able Walk Completely Able Get In / Out Bed Completely Able Housework Completely Able Prepare Meals Completely Able Handle Money Completely Able Shop for Self Completely Able Electronic Signature(s) Signed: 08/21/2020  5:43:09 PM By: Antonieta Iba Signed: 08/21/2020 5:53:43 PM By: Shawn Stall Entered By: Antonieta Iba on 08/21/2020 13:32:05 -------------------------------------------------------------------------------- Education Screening Details Patient Name: Date of Service: Nancy Terry, Nancy Terry 08/21/2020 1:15 PM Medical Record Number: [id number] Patient Account Number: [id number] Date of Birth/Sex: Treating RN: [birth year] (45 y.o. Female) Shawn Stall Primary Care Caron Tardif: Hoy Register Other Clinician: Referring Krishana Lutze: Treating Javarri Segal/Extender: Leotis Pain in Treatment: 0 Primary Learner Assessed: Patient Learning Preferences/Education Level/Primary Language Learning Preference: Explanation, Demonstration, Printed Material Highest Education Level: High School Preferred Language: English Cognitive Barrier Language Barrier: No Translator Needed: No Memory Deficit: No Emotional Barrier: No Cultural/Religious Beliefs Affecting Medical Care: No Physical Barrier Impaired Vision: Yes Glasses Impaired Hearing: No Decreased Hand dexterity: No Knowledge/Comprehension Knowledge Level: Medium Comprehension Level: Medium Ability to understand written instructions: High Ability to understand verbal instructions: High Motivation Anxiety Level: Calm Cooperation: Cooperative Education Importance: Acknowledges Need Interest in Health Problems: Asks Questions Perception: Coherent Willingness to Engage in Self-Management High Activities: Readiness to Engage in Self-Management High Activities: Electronic Signature(s) Signed: 08/21/2020 5:43:09 PM By: Antonieta Iba Signed: 08/21/2020 5:53:43 PM By: Shawn Stall Entered By: Antonieta Iba on 08/21/2020 13:32:36 -------------------------------------------------------------------------------- Fall Risk Assessment Details Patient Name: Date of Service: Nancy Terry, Nancy Terry 08/21/2020 1:15 PM Medical Record Number:  [id number] Patient Account Number: [id number] Date of Birth/Sex: Treating RN: [birth year] (45 y.o. Female) Shawn Stall Primary Care Amillion Scobee: Hoy Register Other Clinician: Referring Jaylea Plourde: Treating Ariq Khamis/Extender: Josephine Cables Weeks in Treatment: 0 Fall Risk Assessment Items Have you had 2 or more falls in the last 12 monthso 0 No Have you had any fall that resulted in injury in the last 12 monthso 0 Yes FALLS RISK SCREEN History of falling - immediate or within 3 months 25 Yes Secondary diagnosis (Do you have 2 or more medical diagnoseso) 0 No Ambulatory aid None/bed rest/wheelchair/nurse 0 Yes Crutches/cane/walker 0 No Furniture 0 No Intravenous therapy Access/Saline/Heparin Lock 0  No Gait/Transferring Normal/ bed rest/ wheelchair 0 Yes Weak (short steps with or without shuffle, stooped but able to lift head while walking, may seek 0 No support from furniture) Impaired (short steps with shuffle, may have difficulty arising from chair, head down, impaired 0 No balance) Mental Status Oriented to own ability 0 Yes Electronic Signature(s) Signed: 08/21/2020 5:43:09 PM By: Antonieta Iba Signed: 08/21/2020 5:53:43 PM By: Shawn Stall Entered By: Antonieta Iba on 08/21/2020 13:33:23 -------------------------------------------------------------------------------- Foot Assessment Details Patient Name: Date of Service: Nancy Terry, Nancy Terry 08/21/2020 1:15 PM Medical Record Number: [id number] Patient Account Number: [id number] Date of Birth/Sex: Treating RN: 1976/03/16 (45 y.o. Female) Shawn Stall Primary Care Shaunda Tipping: Hoy Register Other Clinician: Referring Floy Angert: Treating Jaydalee Bardwell/Extender: Josephine Cables Weeks in Treatment: 0 Foot Assessment Items Site Locations + = Sensation present, - = Sensation absent, C = Callus, U = Ulcer R = Redness, W = Warmth, M = Maceration, PU = Pre-ulcerative lesion F = Fissure, S = Swelling, D =  Dryness Assessment Right: Left: Other Deformity: No No Prior Foot Ulcer: No No Prior Amputation: No No Charcot Joint: No No Ambulatory Status: Ambulatory Without Help Gait: Steady Electronic Signature(s) Signed: 08/21/2020 5:43:09 PM By: Antonieta Iba Signed: 08/21/2020 5:53:43 PM By: Shawn Stall Entered By: Antonieta Iba on 08/21/2020 13:37:47 -------------------------------------------------------------------------------- Nutrition Risk Screening Details Patient Name: Date of Service: Nancy Terry, Nancy Terry 08/21/2020 1:15 PM Medical Record Number: [id number] Patient Account Number: [id number] Date of Birth/Sex: Treating RN: 11-17-1975 (45 y.o. Female) Shawn Stall Primary Care Lindi Abram: Hoy Register Other Clinician: Referring Claudia Alvizo: Treating Dustyn Armbrister/Extender: Josephine Cables Weeks in Treatment: 0 Height (in): Weight (lbs): Body Mass Index (BMI): Nutrition Risk Screening Items Score Screening NUTRITION RISK SCREEN: I have an illness or condition that made me change the kind and/or amount of food I eat 0 No I eat fewer than two meals per day 3 Yes I eat few fruits and vegetables, or milk products 0 No I have three or more drinks of beer, liquor or wine almost every day 0 No I have tooth or mouth problems that make it hard for me to eat 0 No I don't always have enough money to buy the food I need 0 No I eat alone most of the time 0 No I take three or more different prescribed or over-the-counter drugs a day 0 No Without wanting to, I have lost or gained 10 pounds in the last six months 0 No I am not always physically able to shop, cook and/or feed myself 0 No Nutrition Protocols Good Risk Protocol Moderate Risk Protocol 0 Provide education on nutrition High Risk Proctocol Risk Level: Moderate Risk Score: 3 Electronic Signature(s) Signed: 08/21/2020 2:00:03 PM By: Antonieta Iba Signed: 08/21/2020 5:53:43 PM By: Shawn Stall Entered By: Antonieta Iba  on 08/21/2020 14:00:03

## 2020-08-21 NOTE — Progress Notes (Signed)
Nancy Terry ([id number]) , Visit Report for 08/21/2020 Chief Complaint Document Details Patient Name: Date of Service: Nancy Terry, Nancy Terry 08/21/2020 1:15 PM Medical Record Number: [id number] Patient Account Number: [id number] Date of Birth/Sex: Treating RN: 07/09/1975 (45 y.o. Female) Shawn Stall Primary Care Provider: Hoy Register Other Clinician: Referring Provider: Treating Provider/Extender: Josephine Cables Weeks in Treatment: 0 Information Obtained from: Patient Chief Complaint 08/21/2020; patient is here with a wound on the left anterior mid tibia Electronic Signature(s) Signed: 08/21/2020 5:46:09 PM By: Baltazar Najjar MD Entered By: Baltazar Najjar on 08/21/2020 14:32:35 -------------------------------------------------------------------------------- Debridement Details Patient Name: Date of Service: Nancy Terry, Nancy Terry 08/21/2020 1:15 PM Medical Record Number: [id number] Patient Account Number: [id number] Date of Birth/Sex: Treating RN: Apr 16, 1976 (45 y.o. Female) Shawn Stall Primary Care Provider: Hoy Register Other Clinician: Referring Provider: Treating Provider/Extender: Josephine Cables Weeks in Treatment: 0 Debridement Performed for Assessment: Wound #1 Left,Anterior Lower Leg Performed By: Physician Maxwell Caul., MD Debridement Type: Debridement Level of Consciousness (Pre-procedure): Awake and Alert Pre-procedure Verification/Time Out Yes - 14:10 Taken: Start Time: 14:11 Pain Control: Other : Benzocaine 20% T Area Debrided (L x W): otal 1 (cm) x 2 (cm) = 2 (cm) Tissue and other material debrided: Viable, Non-Viable, Slough, Subcutaneous, Skin: Dermis , Skin: Epidermis, Fibrin/Exudate, Slough Level: Skin/Subcutaneous Tissue Debridement Description: Excisional Instrument: Curette Bleeding: Minimum Hemostasis Achieved: Pressure End Time: 14:15 Procedural Pain: 0 Post Procedural Pain: 0 Response to Treatment: Procedure was  tolerated well Level of Consciousness (Post- Awake and Alert procedure): Post Debridement Measurements of Total Wound Length: (cm) 0.5 Width: (cm) 1.9 Depth: (cm) 0.1 Volume: (cm) 0.075 Character of Wound/Ulcer Post Debridement: Improved Post Procedure Diagnosis Same as Pre-procedure Electronic Signature(s) Signed: 08/21/2020 5:46:09 PM By: Baltazar Najjar MD Signed: 08/21/2020 5:53:43 PM By: Shawn Stall Entered By: Baltazar Najjar on 08/21/2020 14:32:13 -------------------------------------------------------------------------------- HPI Details Patient Name: Date of Service: Nancy Terry, Nancy Terry 08/21/2020 1:15 PM Medical Record Number: [id number] Patient Account Number: [id number] Date of Birth/Sex: Treating RN: 1976-05-05 (45 y.o. 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 She was originally referred here apparently for an area on the right lateral and posterior lower calf which was prompted by a fall while walking dogs. She was seen in the ER on 05/02/2020 for this she was given Silvadene and Septra and I gather these areas closed over. Right leg x-ray was negative. HOWEVER she had another fall a week ago again while walking her dog she has an area on the left anterior mid tibia. She tells me she has had a long standing history of edema in her legs that is but not easy to control. She has wound history. She does not wear compression of any type. No history of a DVT reviewing [place] link. Past medical history includes hypertension, migraines, chronic venous insufficiency, hidradenitis, chronic pain on methadone, chronic hep C ABIs in our clinic were 1.03 on the right and 1.1 on the left Electronic Signature(s) Signed:  08/21/2020 5:46:09 PM By: Baltazar Najjar MD Entered By: Baltazar Najjar on 08/21/2020 14:34:13 -------------------------------------------------------------------------------- Physical Exam Details Patient Name: Date of Service: Nancy Terry, Nancy Terry 08/21/2020 1:15 PM Medical Record Number: [id number] Patient Account Number: [id number] Date of Birth/Sex: Treating RN: 1976-01-29 (45 y.o. Female) Shawn Stall Primary Care Provider: Hoy Register Other Clinician: Referring Provider: Treating  Provider/Extender: Josephine Cables Weeks in Treatment: 0 Constitutional Patient is hypertensive.. Pulse regular and within target range for patient.Marland Kitchen Respirations regular, non-labored and within target range.. Temperature is normal and within the target range for the patient.Marland Kitchen Appears in no distress. Cardiovascular Pedal pulses palpable and strong bilaterally.. She has bilateral lower extremity edema that is mostly nonpitting. Skin changes of stasis dermatitis anteriorly right greater than left.. Integumentary (Hair, Skin) Mark skin damage damage in the right greater than left anterior lower legs. Very tightly fibrosed skin.. Notes Wound exam; the patient has a small horizontal wound in the left lower mid tibia area. Fibrinous debris on the surface which I removed with a #3 curette hemostasis with direct pressure. No evidence of surrounding infection Electronic Signature(s) Signed: 08/21/2020 5:46:09 PM By: Baltazar Najjar MD Entered By: Baltazar Najjar on 08/21/2020 14:35:51 -------------------------------------------------------------------------------- Physician Orders Details Patient Name: Date of Service: Nancy Terry, Nancy Terry 08/21/2020 1:15 PM Medical Record Number: [id number] Patient Account Number: [id number] Date of Birth/Sex: Treating RN: 08-11-1975 (45 y.o. Female) Shawn Stall Primary Care Provider: Hoy Register Other Clinician: Referring Provider: Treating Provider/Extender:  Josephine Cables Weeks in Treatment: 0 Verbal / Phone Orders: No Diagnosis Coding Follow-up Appointments Return Appointment in 1 week. Bathing/ Shower/ Hygiene May shower with protection but do not get wound dressing(s) wet. - use a cast protector while showering. Edema Control - Lymphedema / SCD / Other Elevate legs to the level of the heart or above for 30 minutes daily and/or when sitting, a frequency of: - throughout the day. Avoid standing for long periods of time. Exercise regularly Moisturize legs daily. - right leg every night before bed. Wound Treatment Wound #1 - Lower Leg Wound Laterality: Left, Anterior Cleanser: Soap and Water 1 x Per Week Discharge Instructions: May shower and wash wound with dial antibacterial soap and water prior to dressing change. Peri-Wound Care: Triamcinolone 15 (g) 1 x Per Week Discharge Instructions: Use triamcinolone 15 (g) as directed Peri-Wound Care: Sween Lotion (Moisturizing lotion) 1 x Per Week Discharge Instructions: Apply moisturizing lotion as directed Prim Dressing: KerraCel Ag Gelling Fiber Dressing, 2x2 in (silver alginate) 1 x Per Week ary Discharge Instructions: Apply silver alginate to wound bed as instructed Secondary Dressing: Woven Gauze Sponge, Non-Sterile 4x4 in 1 x Per Week Discharge Instructions: Apply over primary dressing as directed. Secondary Dressing: ABD Pad, 8x10 1 x Per Week Discharge Instructions: Apply over primary dressing as directed. Compression Wrap: ThreePress (3 layer compression wrap) 1 x Per Week Discharge Instructions: Apply three layer compression as directed. Services and Therapies Venous Studies -Bilateral - [place] Image for venous reflux studies of bilateral lower leg wounds related to edema, lymphedema, and venous insufficiency to both legs. ICD 10 code Patient Medications llergies: Haldol A Notifications Medication Indication Start End benzocaine DOSE topical 20 % aerosol -  aerosol topical only applied in clinic for debridements only by MD. Electronic Signature(s) Signed: 08/21/2020 5:46:09 PM By: Baltazar Najjar MD Signed: 08/21/2020 5:53:43 PM By: Shawn Stall Entered By: Shawn Stall on 08/21/2020 14:20:54 Prescription 08/21/2020 Ewton Marche -------------------------------------------------------------------------------- , Baltazar Najjar MD Patient Name: Provider: 23-Jan-1976 [id number] Date of Birth: NPI#: Female [id number] Sex: DEA #: [phone] Phone #: License #: Eligha Bridegroom [facility] Patient Address: [address] [place], [place] [address] [address] [place], [place] [address] [phone] Allergies Haldol Provider's Orders Venous Studies -Bilateral - [place] Image for venous reflux studies of bilateral lower leg wounds related to edema, lymphedema,  and venous insufficiency to both legs. ICD 10 code Hand Signature: Date(s): Prescription 08/21/2020 Lucchese Leata Mouse MD Patient Name: Provider: Aug 14, 1975 [id number] Date of Birth: NPI#: Female [id number] Sex: DEA #: [phone] [id number] Phone #: License #: Eligha Bridegroom [facility] Patient Address: [address] [place], [place] [address] [address] [place], [place] [address] [phone] Allergies Haldol Medication Medication: Route: Strength: Form: benzocaine topical 20 % aerosol Class: TOPICAL LOCAL ANESTHETICS Dose: Frequency / Time: Indication: aerosol topical only applied in clinic for debridements only by MD. Number of Refills: Number of Units: 0 Generic Substitution: Start Date: End Date: Administered at Facility: Substitution Permitted Yes Time Administered: Time Discontinued: Note to Pharmacy: Hand Signature: Date(s): Electronic Signature(s) Signed: 08/21/2020 5:46:09 PM By: Baltazar Najjar MD Signed: 08/21/2020 5:53:43 PM By: Shawn Stall Entered By: Shawn Stall on 08/21/2020 14:20:54 -------------------------------------------------------------------------------- Problem List Details Patient Name: Date of Service: ALYN, RIEDINGER 08/21/2020 1:15 PM Medical Record Number: [id number] Patient Account Number: [id number] Date of Birth/Sex: Treating RN: Jul 25, 1975 (45 y.o. Female) Shawn Stall Primary Care Provider: Hoy Register Other Clinician: Referring Provider: Treating Provider/Extender: Josephine Cables Weeks in Treatment: 0 Active Problems ICD-10 Encounter Code Description Active Date MDM Diagnosis I89.0 Lymphedema, not elsewhere classified 08/21/2020 No Yes I87.332 Chronic venous hypertension (idiopathic) with ulcer and inflammation of left 08/21/2020 No Yes lower extremity L97.828 Non-pressure chronic ulcer of other part of left lower leg with other specified 08/21/2020 No Yes severity Inactive Problems Resolved Problems Electronic Signature(s) Signed: 08/21/2020 5:46:09 PM By: Baltazar Najjar MD Entered By: Baltazar Najjar on 08/21/2020 14:31:00 -------------------------------------------------------------------------------- Progress Note Details Patient Name: Date of Service: Nancy Terry, Nancy Terry 08/21/2020 1:15 PM Medical Record Number: [id number] Patient Account Number: [id number] Date of Birth/Sex: Treating RN: 02-Sep-1975 (45 y.o. 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 She was originally referred here apparently for an area on the right lateral and posterior lower calf which was prompted by a fall  while walking dogs. She was seen in the ER on 05/02/2020 for this she was given Silvadene and Septra and I gather these areas closed over. Right leg x-ray was negative. HOWEVER she had another fall a week ago again while walking her dog she has an area on the left anterior mid tibia. She tells me she has had a long standing history of edema in her legs that is but not easy to control. She has wound history. She does not wear compression of any type. No history of a DVT reviewing  link. Past medical history includes hypertension, migraines, chronic venous insufficiency, hidradenitis, chronic pain on methadone, chronic hep C ABIs in our clinic were 1.03 on the right and 1.1 on the left Patient History Information obtained from Patient. Allergies Haldol (Reaction: throat swelling, dizzy) Family History Cancer - Maternal Grandparents, Heart Disease - Mother, Hypertension - Maternal Grandparents, Lung Disease - Maternal Grandparents, No family history of Diabetes, Hereditary Spherocytosis, Kidney Disease, Seizures, Stroke, Thyroid Problems, Tuberculosis. Social History Current every day smoker, Marital Status - Divorced, Alcohol Use - Never, Drug Use - Prior History, Caffeine Use - Daily. Medical History Cardiovascular Patient has history of Hypertension Gastrointestinal  Patient has history of Hepatitis C Integumentary (Skin) Denies history of History of Burn Medical A Surgical History Notes nd Genitourinary Complex Ovarian Cyst Neurologic Sciatica, Migraines Psychiatric anxiety/depression Review of Systems (ROS) Eyes Complains or has symptoms of Glasses / Contacts. Ear/Nose/Mouth/Throat Denies complaints or symptoms of Chronic sinus problems or rhinitis. Respiratory Denies complaints or symptoms of Chronic or frequent coughs, Shortness of Breath. Gastrointestinal Denies complaints or symptoms of Frequent diarrhea, Nausea, Vomiting. Endocrine Denies complaints or  symptoms of Heat/cold intolerance. Integumentary (Skin) Complains or has symptoms of Wounds. Objective Constitutional Patient is hypertensive.. Pulse regular and within target range for patient.Marland Kitchen Respirations regular, non-labored and within target range.. Temperature is normal and within the target range for the patient.Marland Kitchen Appears in no distress. Vitals Time Taken: 1:21 PM, Temperature: 98.6 F, Pulse: 55 bpm, Respiratory Rate: 16 breaths/min, Blood Pressure: 155/76 mmHg. Cardiovascular Pedal pulses palpable and strong bilaterally.. She has bilateral lower extremity edema that is mostly nonpitting. Skin changes of stasis dermatitis anteriorly right greater than left.. General Notes: Wound exam; the patient has a small horizontal wound in the left lower mid tibia area. Fibrinous debris on the surface which I removed with a #3 curette hemostasis with direct pressure. No evidence of surrounding infection Integumentary (Hair, Skin) Mark skin damage damage in the right greater than left anterior lower legs. Very tightly fibrosed skin.. Wound #1 status is Open. Original cause of wound was Trauma. The date acquired was: 08/14/2020. The wound is located on the Left,Anterior Lower Leg. The wound measures 0.5cm length x 1.9cm width x 0.1cm depth; 0.746cm^2 area and 0.075cm^3 volume. There is Fat Layer (Subcutaneous Tissue) exposed. There is no tunneling or undermining noted. There is a small amount of serosanguineous drainage noted. The wound margin is distinct with the outline attached to the wound base. There is large (67-100%) red granulation within the wound bed. There is a small (1-33%) amount of necrotic tissue within the wound bed including Adherent Slough. General Notes: Periwound erythema Assessment Active Problems ICD-10 Lymphedema, not elsewhere classified Chronic venous hypertension (idiopathic) with ulcer and inflammation of left lower extremity Non-pressure chronic ulcer of other part of  left lower leg with other specified severity Procedures Wound #1 Pre-procedure diagnosis of Wound #1 is a Trauma, Other located on the Left,Anterior Lower Leg . There was a Excisional Skin/Subcutaneous Tissue Debridement with a total area of 2 sq cm performed by Maxwell Caul., MD. With the following instrument(s): Curette to remove Viable and Non-Viable tissue/material. Material removed includes Subcutaneous Tissue, Slough, Skin: Dermis, Skin: Epidermis, and Fibrin/Exudate after achieving pain control using Other (Benzocaine 20%). A time out was conducted at 14:10, prior to the start of the procedure. A Minimum amount of bleeding was controlled with Pressure. The procedure was tolerated well with a pain level of 0 throughout and a pain level of 0 following the procedure. Post Debridement Measurements: 0.5cm length x 1.9cm width x 0.1cm depth; 0.075cm^3 volume. Character of Wound/Ulcer Post Debridement is improved. Post procedure Diagnosis Wound #1: Same as Pre-Procedure Pre-procedure diagnosis of Wound #1 is a Trauma, Other located on the Left,Anterior Lower Leg . There was a Three Layer Compression Therapy Procedure by Zenaida Deed, RN. Post procedure Diagnosis Wound #1: Same as Pre-Procedure Plan Follow-up Appointments: Return Appointment in 1 week. Bathing/ Shower/ Hygiene: May shower with protection but do not get wound dressing(s) wet. - use a cast protector while showering. Edema Control - Lymphedema / SCD / Other: Elevate legs to the level of the heart or  above for 30 minutes daily and/or when sitting, a frequency of: - throughout the day. Avoid standing for long periods of time. Exercise regularly Moisturize legs daily. - right leg every night before bed. Services and Therapies ordered were: Venous Studies -Bilateral - [place] Image for venous reflux studies of bilateral lower leg wounds related to edema, lymphedema, and venous insufficiency to both legs. ICD 10 code The  following medication(s) was prescribed: benzocaine topical 20 % aerosol aerosol topical only applied in clinic for debridements only by MD. was prescribed at facility WOUND #1: - Lower Leg Wound Laterality: Left, Anterior Cleanser: Soap and Water 1 x Per Week/ Discharge Instructions: May shower and wash wound with dial antibacterial soap and water prior to dressing change. Peri-Wound Care: Triamcinolone 15 (g) 1 x Per Week/ Discharge Instructions: Use triamcinolone 15 (g) as directed Peri-Wound Care: Sween Lotion (Moisturizing lotion) 1 x Per Week/ Discharge Instructions: Apply moisturizing lotion as directed Prim Dressing: KerraCel Ag Gelling Fiber Dressing, 2x2 in (silver alginate) 1 x Per Week/ ary Discharge Instructions: Apply silver alginate to wound bed as instructed Secondary Dressing: Woven Gauze Sponge, Non-Sterile 4x4 in 1 x Per Week/ Discharge Instructions: Apply over primary dressing as directed. Secondary Dressing: ABD Pad, 8x10 1 x Per Week/ Discharge Instructions: Apply over primary dressing as directed. Com pression Wrap: ThreePress (3 layer compression wrap) 1 x Per Week/ Discharge Instructions: Apply three layer compression as directed. 1. Debridement as noted. We will use silver alginate ABDs under 3 layer compression 2. This patient has chronic venous insufficiency and likely some degree of secondary lymphedema. She has had 2 recent wounds and apparently more remote wound history. 3. I decided to order venous reflux studies on her. She is still fairly young to be having this form of problem we will see what these show. 4. No matter what the reflux studies show she is going to need ongoing compression and I talked to her about this Electronic Signature(s) Signed: 08/21/2020 5:46:09 PM By: Baltazar Najjarobson, Jaspreet Hollings MD Entered By: Baltazar Najjarobson, Aleyah Balik on 08/21/2020 14:36:59 -------------------------------------------------------------------------------- HxROS Details Patient Name: Date  of Service: [name], Nancy Terry 08/21/2020 1:15 PM Medical Record Number: [id number] Patient Account Number: [id number] Date of Birth/Sex: Treating RN: 19-Oct-1975 (45 y.o. Female) Shawn Stalleaton, Bobbi Primary Care Provider: Hoy RegisterNewlin, Enobong Other Clinician: Referring Provider: Treating Provider/Extender: Josephine Cablesobson, Dakotah Heiman Newlin, Enobong Weeks in Treatment: 0 Information Obtained From Patient Eyes Complaints and Symptoms: Positive for: Glasses / Contacts Ear/Nose/Mouth/Throat Complaints and Symptoms: Negative for: Chronic sinus problems or rhinitis Respiratory Complaints and Symptoms: Negative for: Chronic or frequent coughs; Shortness of Breath Gastrointestinal Complaints and Symptoms: Negative for: Frequent diarrhea; Nausea; Vomiting Medical History: Positive for: Hepatitis C Endocrine Complaints and Symptoms: Negative for: Heat/cold intolerance Integumentary (Skin) Complaints and Symptoms: Positive for: Wounds Medical History: Negative for: History of Burn Hematologic/Lymphatic Cardiovascular Medical History: Positive for: Hypertension Genitourinary Medical History: Past Medical History Notes: Complex Ovarian Cyst Immunological Neurologic Medical History: Past Medical History Notes: Sciatica, Migraines Oncologic Psychiatric Medical History: Past Medical History Notes: anxiety/depression Immunizations Pneumococcal Vaccine: Received Pneumococcal Vaccination: No Implantable Devices None Family and Social History Cancer: Yes - Maternal Grandparents; Diabetes: No; Heart Disease: Yes - Mother; Hereditary Spherocytosis: No; Hypertension: Yes - Maternal Grandparents; Kidney Disease: No; Lung Disease: Yes - Maternal Grandparents; Seizures: No; Stroke: No; Thyroid Problems: No; Tuberculosis: No; Current every day smoker; Marital Status - Divorced; Alcohol Use: Never; Drug Use: Prior History; Caffeine Use: Daily; Financial Concerns: No; Food, Clothing or Shelter Needs: No;  Support System Lacking: No;  Transportation Concerns: No Electronic Signature(s) Signed: 08/21/2020 5:43:09 PM By: Antonieta Iba Signed: 08/21/2020 5:46:09 PM By: Baltazar Najjar MD Signed: 08/21/2020 5:53:43 PM By: Shawn Stall Entered By: Antonieta Iba on 08/21/2020 13:31:32 -------------------------------------------------------------------------------- SuperBill Details Patient Name: Date of Service: SORAYA, Nancy Terry 08/21/2020 Medical Record Number: [id number] Patient Account Number: [id number] Date of Birth/Sex: Treating RN: Oct 08, 1975 (45 y.o. Female) Shawn Stall Primary Care Provider: Hoy Register Other Clinician: Referring Provider: Treating Provider/Extender: Josephine Cables Weeks in Treatment: 0 Diagnosis Coding ICD-10 Codes Code Description I89.0 Lymphedema, not elsewhere classified I87.332 Chronic venous hypertension (idiopathic) with ulcer and inflammation of left lower extremity L97.828 Non-pressure chronic ulcer of other part of left lower leg with other specified severity Facility Procedures CPT4 Code: 88416606 Description: 99213 - WOUND CARE VISIT-LEV 3 EST PT Modifier: Quantity: 1 CPT4 Code: [id number] Description: 11042 - DEB SUBQ TISSUE 20 SQ CM/< ICD-10 Diagnosis Description I89.0 Lymphedema, not elsewhere classified Modifier: Quantity: 1 Physician Procedures : CPT4 Code Description Modifier [id number] WC PHYS LEVEL 3 NEW PT 25 ICD-10 Diagnosis Description I89.0 Lymphedema, not elsewhere classified I87.332 Chronic venous hypertension (idiopathic) with ulcer and inflammation of left lower extremity L97.828  Non-pressure chronic ulcer of other part of left lower leg with other specified severity Quantity: 1 Electronic Signature(s) Signed: 08/21/2020 5:46:09 PM By: Baltazar Najjar MD Entered By: Baltazar Najjar on 08/21/2020 14:37:53

## 2020-08-22 ENCOUNTER — Ambulatory Visit: Payer: Medicaid Other

## 2020-08-22 ENCOUNTER — Other Ambulatory Visit: Payer: Self-pay | Admitting: Internal Medicine

## 2020-08-22 DIAGNOSIS — I872 Venous insufficiency (chronic) (peripheral): Secondary | ICD-10-CM

## 2020-08-22 DIAGNOSIS — R609 Edema, unspecified: Secondary | ICD-10-CM

## 2020-08-22 DIAGNOSIS — I890 Lymphedema, not elsewhere classified: Secondary | ICD-10-CM

## 2020-08-28 ENCOUNTER — Encounter (HOSPITAL_BASED_OUTPATIENT_CLINIC_OR_DEPARTMENT_OTHER): Payer: Self-pay | Admitting: Internal Medicine

## 2020-08-29 ENCOUNTER — Ambulatory Visit: Payer: Self-pay | Attending: Family Medicine

## 2020-08-29 ENCOUNTER — Other Ambulatory Visit: Payer: Self-pay

## 2020-09-03 ENCOUNTER — Other Ambulatory Visit: Payer: Medicaid Other

## 2020-09-04 ENCOUNTER — Encounter: Payer: Medicaid Other | Admitting: Family Medicine

## 2020-09-04 ENCOUNTER — Encounter (HOSPITAL_BASED_OUTPATIENT_CLINIC_OR_DEPARTMENT_OTHER): Payer: Self-pay | Admitting: Internal Medicine

## 2020-09-10 ENCOUNTER — Other Ambulatory Visit: Payer: Self-pay | Admitting: Family Medicine

## 2020-09-10 DIAGNOSIS — Z1231 Encounter for screening mammogram for malignant neoplasm of breast: Secondary | ICD-10-CM

## 2020-09-18 ENCOUNTER — Ambulatory Visit
Admission: RE | Admit: 2020-09-18 | Discharge: 2020-09-18 | Disposition: A | Discharge status: Home / Self Care | Payer: Medicaid Other | Source: Ambulatory Visit | Attending: Internal Medicine | Admitting: Internal Medicine

## 2020-09-18 DIAGNOSIS — I890 Lymphedema, not elsewhere classified: Secondary | ICD-10-CM

## 2020-09-18 DIAGNOSIS — R609 Edema, unspecified: Secondary | ICD-10-CM

## 2020-09-18 DIAGNOSIS — I872 Venous insufficiency (chronic) (peripheral): Secondary | ICD-10-CM

## 2020-09-18 NOTE — Consult Note (Signed)
Chief Complaint: Lower extremity wound  Referring Physician(s): Nancy Terry  PCP: Dr. Hoy Terry  History of Present Illness: Nancy Terry is a 45 y.o. female presenting to VIR clinic today as scheduled consult, kindly referred by Dr. Leanord Terry, for evaluation of lower extremity wounds and venous disease.   Ms Nancy Terry is here by herself for the interview.    She tells me that she has had 1 wound on the right calf and 1 wound on the left calf.  The first was on the right, and occurred last year late in the year. 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  This caused an injury to the posterior distal right calf.  This healed, but required extra attention and seemed to her like it took a long time.  She did not get any advanced care for this wound, other than at the ED.    She had another recent injury while performing dog-walking duties of the left calf.  For this wound, she was referred to the wound care center.  At this time, the wound is healed, with the most recent note dated 08/21/20..    She denies any prior wounds of the left or the right.  She denies any claudication type symptoms.  She denies any known prior DVT/PE.   She tells me that she needs to sleep on 3 pillows for comfort, otherwise she has trouble breathing.  She sleeps with her legs elevated on the bed (not in a dependent position).  She has never had admission for CHF, although she has family members including her mother with history of CHF.    She denies any prior MI or stroke.  She denies any resting chest pain.    She tells me that her current job is a Chief Strategy Officer.  Previously, she was involved in Set designer and furniture, and she has spend many years working in standing position, it sounds, in stationary position.   A bilateral duplex today of the lower extremities is negative for DVT, and negative for venous reflux.    He has had prior normal ABI recorded.    Past Medical History:  Diagnosis Date  . Anxiety   . Depression   . Hepatitis C   . Herpes infection   . Hypertension   . Kidney stones   . Migraines   . Narcotic abuse [facility])     Past Surgical History:  Procedure Laterality Date  . BREAST BIOPSY    . ELBOW SURGERY Left   . KIDNEY STONE SURGERY     with stent placement  . LITHOTRIPSY    . TUBAL LIGATION      Allergies: Haldol [haloperidol decanoate] and Nitrates, organic  Medications: Prior to Admission medications   Medication Sig Start Date End Date Taking? Authorizing Provider  acetaminophen-codeine (TYLENOL #3) 300-30 MG tablet Take 1-2 tablets by mouth every 6 (six) hours as needed for moderate pain. 05/02/20   Eustace Moore, MD  amoxicillin (AMOXIL) 500 MG capsule TAKE 1 CAPSULE BY MOUTH TWICE A DAY 01/28/20 01/27/21  [place], Amy, MD  furosemide (LASIX) 20 MG tablet Take 1 tablet (20 mg total) by mouth daily. 05/02/20   Eustace Moore, MD  ibuprofen (ADVIL) 200 MG tablet Take 200 mg by mouth every 6 (six) hours as needed.     [provider]  methadone (DOLOPHINE) 10 MG/5ML solution Take 130 mg by mouth every other day. 110 mg every  other day    [provider]  silver sulfADIAZINE (SILVADENE) 1 % cream Apply 1 application topically daily. 05/02/20   Eustace MooreNelson, Yvonne Sue, MD  triamcinolone cream (KENALOG) 0.5 % APPLY 1 APPLICATION ON THE SKIN TWICE A DAY 01/28/20 01/27/21  [place], Amy, MD     Family History  Problem Relation Age of Onset  . Parkinson's disease Mother   . Hypertension Mother   . Rheum arthritis Mother   . Heart failure Mother   . Parkinson's disease Maternal Grandmother   . Hypertension Maternal Grandmother   . Glaucoma Maternal Grandmother   . Lung cancer Maternal Grandmother   . Colon cancer Neg Hx   . Stomach cancer Neg Hx   . Pancreatic cancer Neg Hx   . Esophageal cancer Neg Hx     Social History   Socioeconomic History  . Marital status: Unknown    Spouse  name: Not on file  . Number of children: 3  . Years of education: Not on file  . Highest education level: 9th grade  Occupational History  . Not on file  Tobacco Use  . Smoking status: Current Every Day Smoker    Packs/day: 1.00    Years: 0.50    Pack years: 0.50    Types: Cigarettes  . Smokeless tobacco: Never Used  Vaping Use  . Vaping Use: Never used  Substance and Sexual Activity  . Alcohol use: No  . Drug use: Not Currently    Types: Heroin, Marijuana    Comment:  smoked marijuana 4 days ago.  hasn't used heroin since 06/01/2014  . Sexual activity: Not Currently    Birth control/protection: Surgical  Other Topics Concern  . Not on file  Social History Narrative   ** Merged History Encounter **       Social Determinants of Health   Financial Resource Strain: Not on file  Food Insecurity: No Food Insecurity  . Worried About Programme researcher, broadcasting/film/videounning Out of Food in the Last Year: Never true  . Ran Out of Food in the Last Year: Never true  Transportation Needs: No Transportation Needs  . Lack of Transportation (Medical): No  . Lack of Transportation (Non-Medical): No  Physical Activity: Not on file  Stress: Not on file  Social Connections: Not on file       Review of Systems: A 12 point ROS discussed and pertinent positives are indicated in the HPI above.  All other systems are negative.  Review of Systems  Vital Signs: There were no vitals taken for this visit.  Physical Exam General: 45 yo female appearing stated age.  Well-developed, well-nourished.  No distress. HEENT: Atraumatic, normocephalic.  Conjugate gaze, extra-ocular motor intact. No scleral icterus or scleral injection. No lesions on external ears, nose, lips, or gums.  Oral mucosa moist, pink.  Neck: Symmetric with no goiter enlargement.  Chest/Lungs:  Symmetric chest with inspiration/expiration.  No labored breathing.  Clear to auscultation with no wheezes, rhonchi, or rales.  Heart:  RRR, with no third heart  sounds appreciated. No JVD appreciated.  Abdomen:  Soft, NT/ND, with + bowel sounds.   Genito-urinary: Deferred Neurologic: Alert & Oriented to person, place, and time.   Normal affect and insight.  Appropriate questions.  Moving all 4 extremities with gross sensory intact.  Extremities: She has developing lipodermatosclerosis of the right distal calf in the gaitor zone.  There is some ruddy discoloration of the skin at the prior wound site.  No current wound.  No pitting  edema. Minimal telangiectasia.  On the left there is no active wound. The prior wound is healed with no erythema.  Minimal woodiness to the tissues at the wound site.  Minimal telangiectasia.   Mallampati Score:     Imaging: US Venous Img Lower Bilateral (DVT)  Result Date: 09/18/2020 CLINICAL DATA:  45 year old female with a history of bilateral lower extremity venous wounds EXAM: BILATERAL LOWER EXTREMITY VENOUS DOPPLER ULTRASOUND TECHNIQUE: Gray-scale sonography with graded compression, as well as color Doppler and duplex ultrasound were performed to evaluate the lower extremity deep venous systems from the level of the common femoral vein and including the common femoral, femoral, profunda femoral, popliteal and calf veins including the posterior tibial, peroneal and gastrocnemius veins when visible. The superficial great saphenous vein was also interrogated. Spectral Doppler was utilized to evaluate flow at rest and with distal augmentation maneuvers in the common femoral, femoral and popliteal veins. COMPARISON:  None. FINDINGS: RIGHT LOWER EXTREMITY Common Femoral Vein: No evidence of thrombus. Normal compressibility, respiratory phasicity and response to augmentation. Saphenofemoral Junction: No evidence of thrombus. Normal compressibility and flow on color Doppler imaging. Profunda Femoral Vein: No evidence of thrombus. Normal compressibility and flow on color Doppler imaging. Femoral Vein: No evidence of thrombus. Normal  compressibility, respiratory phasicity and response to augmentation. Popliteal Vein: No evidence of thrombus. Normal compressibility, respiratory phasicity and response to augmentation. Calf Veins: No evidence of thrombus. Normal compressibility and flow on color Doppler imaging. GSV at [facility]:  No reflux GSV at Proximal Thigh:  No reflux GSV at Mid Thigh:  No reflux GSV at Distal Thigh:  No reflux GSV at Prox Calf:  No reflux GSV at Mid Calf:  No reflux GSV at Distal Calf:  No reflux SSV at Sapheno popliteal junction:  No reflux SSV at mid calf:  No reflux SSF at distal calf:  No reflux Varicosities:  None Other Findings: Single perforator vein at the left anterior calf, near the previous wound with no reflux. LEFT LOWER EXTREMITY Common Femoral Vein: No evidence of thrombus. Normal compressibility, respiratory phasicity and response to augmentation. Saphenofemoral Junction: No evidence of thrombus. Normal compressibility and flow on color Doppler imaging. Profunda Femoral Vein: No evidence of thrombus. Normal compressibility and flow on color Doppler imaging. Femoral Vein: No evidence of thrombus. Normal compressibility, respiratory phasicity and response to augmentation. Popliteal Vein: No evidence of thrombus. Normal compressibility, respiratory phasicity and response to augmentation. Calf Veins: No evidence of thrombus. Normal compressibility and flow on color Doppler imaging. GSV at [facility]:  No reflux GSV at Proximal Thigh:  No reflux GSV at Mid Thigh:  No reflux GSV at Distal Thigh:  No reflux GSV at Prox Calf:  No reflux GSV at Mid Calf:  No reflux GSV at Distal Calf:  No reflux SSV at Sapheno popliteal junction:  No reflux SSV at mid calf:  No reflux SSF at distal calf:  No reflux Varicosities:  None Other Findings:  None. IMPRESSION: Sonographic survey of the bilateral lower extremities negative for DVT. Negative for reflux in the bilateral lower extremities. Electronically Signed   By: Gilmer Mor D.O.   On:  09/18/2020 12:20   [place] RAD EVAL AND MGMT  Result Date: 09/18/2020 Please refer to "Notes" to see consult details.   Labs:  CBC: Recent Labs    10/09/19 0730 01/25/20 1253 03/27/20 2257  WBC 9.8 10.7* 6.1  HGB 11.4* 11.5* 9.9*  HCT 37.5 36.6 34.4*  PLT 354 338.0 366    COAGS:  No results for input(s): INR, APTT in the last 8760 hours.  BMP: Recent Labs    10/09/19 0730 02/12/20 0848 03/27/20 2257  NA 141 140 140  K 3.5 4.4 3.4*  CL 105 105 108  CO2 27 25 23   GLUCOSE 127* 103* 92  BUN 7 9 5*  CALCIUM 8.8* 9.2 8.7*  CREATININE 0.66 0.76 0.74  GFRNONAA >60 95 >60  GFRAA >60 110  --     LIVER FUNCTION TESTS: Recent Labs    10/09/19 0730  BILITOT 0.2*  AST 23  ALT 31  ALKPHOS 76  PROT 7.6  ALBUMIN 3.8    TUMOR MARKERS: No results for input(s): AFPTM, CEA, CA199, CHROMGRNA in the last 8760 hours.  Assessment and Plan:  Ms Stachnik is 45 yo female with bilateral healed wounds of the calves, with background changes of chronic venous disease.    Left CEAP-5 (healed) Right CEAP-5 (healed).    She has duplex performed today that shows no DVT or reflux.   I did have a lengthy discussion with her today regarding the anatomy, pathology/pathophysiology, and treatment paradigm for venous disease.  This includes my impression that there is no target for intervention, and that our therapy should be conservative measures.    Given her work history and the background skin change of the right greater than left, I think that many years in standing position has contributed to the changes of venous stasis and probably abnormality in local microcirculation and the stroma (lipodermatosclerosis).    I have encouraged her to start wearing compression stockings, 49 knee-high, to be worn day-time only, taken off at night, on most days or as frequently as possible.  I have given her info for acquiring these.  I have also encouraged her to continue exercise, which helps to pump  any residual fluids/edema.    Further, we discussed avoiding any more trauma to this area.  She asked about shaving her legs, and I suggested that perhaps a depilatory cream or other) might be useful, but to be careful of the application.    We would not schedule a follow up at this point, as surveillance is likely to offer little benefit, and she understands.    Finally, I did encourage her to discuss her 3-pillow orthopnea with her doctor, as she has venous changes and she has a positive family history of CHF. Perhaps ECHO might be considered.   Plan: - Initiate compression stockings lower extremity, knee-high, Darene Lamer bilateral, for CEAP-5 disease bilateral legs.   - I have encouraged her to discuss her 3-pillow orthopnea with her PCP.  - Continue to use prudence with any further trauma of the lower extremity.  - Given the absence of venous reflux, no indication for endovascular therapy.   Electronically Signed: 09/18/2020, 12:40 PM   I spent a total of  60 Minutes   in face to face in clinical consultation, greater than 50% of which was counseling/coordinating care for CEAP-5 disease bilateral lower extremity, prior venous ulcers, possible endovenous therapy.

## 2020-09-23 ENCOUNTER — Telehealth: Payer: Self-pay | Admitting: Family Medicine

## 2020-09-23 NOTE — Telephone Encounter (Signed)
Copied from CRM [id number]. Topic: General - Other >> Sep 22, 2020 12:48 PM Marylen Ponto wrote: Reason for CRM: Theda Sers with [employer] called for an update on the fax that was sent on April 4th. Cb# [phone] Ext. [phone]

## 2020-09-25 ENCOUNTER — Encounter (HOSPITAL_BASED_OUTPATIENT_CLINIC_OR_DEPARTMENT_OTHER): Payer: Medicaid Other | Admitting: Internal Medicine

## 2020-09-26 NOTE — Telephone Encounter (Signed)
Call placed to [facility] to get paperwork re faxed. Message states that there is no VM for this extension.

## 2020-09-30 ENCOUNTER — Telehealth: Payer: Self-pay

## 2020-09-30 DIAGNOSIS — R238 Other skin changes: Secondary | ICD-10-CM

## 2020-09-30 NOTE — Telephone Encounter (Signed)
Will route to PCP.  Call placed to patient to obtain more information and VM was left.

## 2020-09-30 NOTE — Telephone Encounter (Signed)
Copied from CRM [id number]. Topic: Referral - Request for Referral >> Sep 19, 2020 12:54 PM Marylen Ponto wrote: Has patient seen PCP for this complaint? yes  *If NO, is insurance requiring patient see PCP for this issue before PCP can refer them? Referral for which specialty: Dermatology Preferred provider/office: [facility]  Reason for referral: issues with underarms  Patient has an appt with Dr. Alvis Lemmings in June.

## 2020-10-02 ENCOUNTER — Other Ambulatory Visit: Payer: Self-pay

## 2020-10-02 ENCOUNTER — Encounter (HOSPITAL_BASED_OUTPATIENT_CLINIC_OR_DEPARTMENT_OTHER): Payer: No Typology Code available for payment source | Attending: Internal Medicine | Admitting: Internal Medicine

## 2020-10-02 DIAGNOSIS — I87332 Chronic venous hypertension (idiopathic) with ulcer and inflammation of left lower extremity: Secondary | ICD-10-CM

## 2020-10-02 DIAGNOSIS — S81802A Unspecified open wound, left lower leg, initial encounter: Secondary | ICD-10-CM | POA: Insufficient documentation

## 2020-10-02 DIAGNOSIS — X58XXXA Exposure to other specified factors, initial encounter: Secondary | ICD-10-CM | POA: Insufficient documentation

## 2020-10-02 DIAGNOSIS — I10 Essential (primary) hypertension: Secondary | ICD-10-CM | POA: Insufficient documentation

## 2020-10-03 NOTE — Progress Notes (Signed)
Nancy Terry ([id number]) , Visit Report for 10/02/2020 Arrival Information Details Patient Name: Date of Service: Nancy Terry 10/02/2020 12:30 PM Medical Record Number: [id number] Patient Account Number: [id number] Date of Birth/Sex: Treating RN: 1975-10-20 (45 y.o. [name], Millard.Loa Primary Care Taleshia Luff: Hoy Register Other Clinician: Referring Lossie Kalp: Treating Rexine Gowens/Extender: Vickki Hearing Weeks in Treatment: 6 Visit Information History Since Last Visit Added or deleted any medications: No Patient Arrived: Ambulatory Any new allergies or adverse reactions: No Arrival Time: 12:51 Had a fall or experienced change in No Accompanied By: self activities of daily living that may affect Transfer Assistance: None risk of falls: Patient Identification Verified: Yes Signs or symptoms of abuse/neglect since last visito No Secondary Verification Process Completed: Yes Hospitalized since last visit: No Patient Requires Transmission-Based Precautions: No Implantable device outside of the clinic excluding No Patient Has Alerts: No cellular tissue based products placed in the center since last visit: Has Dressing in Place as Prescribed: Yes Pain Present Now: No Electronic Signature(s) Signed: 10/03/2020 4:35:52 PM By: Karl Ito Entered By: Karl Ito on 10/02/2020 12:53:25 -------------------------------------------------------------------------------- Clinic Level of Care Assessment Details Patient Name: Date of Service: Nancy Terry 10/02/2020 12:30 PM Medical Record Number: [id number] Patient Account Number: [id number] Date of Birth/Sex: Treating RN: [birth year] (45 y.o. [name], Millard.Loa Primary Care Griselda Tosh: Hoy Register Other Clinician: Referring Dougles Kimmey: Treating Mickie Badders/Extender: Vickki Hearing Weeks in Treatment: 6 Clinic Level of Care Assessment Items TOOL 4 Quantity Score X- 1 0 Use when only an EandM is  performed on FOLLOW-UP visit ASSESSMENTS - Nursing Assessment / Reassessment X- 1 10 Reassessment of Co-morbidities (includes updates in patient status) X- 1 5 Reassessment of Adherence to Treatment Plan ASSESSMENTS - Wound and Skin A ssessment / Reassessment X - Simple Wound Assessment / Reassessment - one wound 1 5 []  - 0 Complex Wound Assessment / Reassessment - multiple wounds X- 1 10 Dermatologic / Skin Assessment (not related to wound area) ASSESSMENTS - Focused Assessment X- 1 5 Circumferential Edema Measurements - multi extremities X- 1 10 Nutritional Assessment / Counseling / Intervention []  - 0 Lower Extremity Assessment (monofilament, tuning fork, pulses) []  - 0 Peripheral Arterial Disease Assessment (using hand held doppler) ASSESSMENTS - Ostomy and/or Continence Assessment and Care []  - 0 Incontinence Assessment and Management []  - 0 Ostomy Care Assessment and Management (repouching, etc.) PROCESS - Coordination of Care X - Simple Patient / Family Education for ongoing care 1 15 []  - 0 Complex (extensive) Patient / Family Education for ongoing care X- 1 10 Staff obtains , Records, T Results / Process Orders est []  - 0 Staff telephones HHA, Nursing Homes / Clarify orders / etc []  - 0 Routine Transfer to another Facility (non-emergent condition) []  - 0 Routine Hospital Admission (non-emergent condition) []  - 0 New Admissions / / Ordering NPWT Apligraf, etc. , []  - 0 Emergency Hospital Admission (emergent condition) X- 1 10 Simple Discharge Coordination []  - 0 Complex (extensive) Discharge Coordination PROCESS - Special Needs []  - 0 Pediatric / Minor Patient Management []  - 0 Isolation Patient Management []  - 0 Hearing / Language / Visual special needs []  - 0 Assessment of Community assistance (transportation, D/C planning, etc.) []  - 0 Additional assistance / Altered mentation []  - 0 Support Surface(s) Assessment  (bed, cushion, seat, etc.) INTERVENTIONS - Wound Cleansing / Measurement X - Simple Wound Cleansing - one wound 1 5 []  - 0 Complex Wound Cleansing - multiple wounds X- 1 5 Wound  Imaging (photographs - any number of wounds) []  - 0 Wound Tracing (instead of photographs) X- 1 5 Simple Wound Measurement - one wound []  - 0 Complex Wound Measurement - multiple wounds INTERVENTIONS - Wound Dressings []  - 0 Small Wound Dressing one or multiple wounds []  - 0 Medium Wound Dressing one or multiple wounds []  - 0 Large Wound Dressing one or multiple wounds []  - 0 Application of Medications - topical []  - 0 Application of Medications - injection INTERVENTIONS - Miscellaneous []  - 0 External ear exam []  - 0 Specimen Collection (cultures, biopsies, blood, body fluids, etc.) []  - 0 Specimen(s) / Culture(s) sent or taken to Lab for analysis []  - 0 Patient Transfer (multiple staff / / Similar devices) []  - 0 Simple Staple / Suture removal (25 or less) []  - 0 Complex Staple / Suture removal (26 or more) []  - 0 Hypo / Hyperglycemic Management (close monitor of Blood Glucose) []  - 0 Ankle / Brachial Index (ABI) - do not check if billed separately X- 1 5 Vital Signs Has the patient been seen at the hospital within the last three years: Yes Total Score: 100 Level Of Care: New/Established - Level 3 Electronic Signature(s) Signed: 10/02/2020 6:31:57 PM By: Entered By: on 10/02/2020 18:04:16 -------------------------------------------------------------------------------- Encounter Discharge Information Details Patient Name: Date of Service: Nancy Terry 10/02/2020 12:30 PM Medical Record Number: Patient Account Number: Date of Birth/Sex: Treating RN: Oct 10, 1975 (45 y.o. Nurse, adult Primary Care Empress Newmann: Other Clinician: Referring Ples Trudel: Treating Sarah Zerby/Extender: Weeks in Treatment: 6 Encounter Discharge Information Items Discharge Condition: Stable Ambulatory Status: Ambulatory Discharge Destination: Home Transportation: Private Auto Accompanied By: self Schedule Follow-up Appointment: No Clinical Summary of Care: Electronic Signature(s) Signed: 10/02/2020 6:31:57 PM By: Entered By: 10/04/2020 on 10/02/2020 18:04:39 -------------------------------------------------------------------------------- Lower Extremity Assessment Details Patient Name: Date of Service: Nancy Terry, Nancy Terry 10/02/2020 12:30 PM Medical Record Number: Laurann Montana Patient Account Number: 10/04/2020 Date of Birth/Sex: Treating RN: [birth year] (45 y.o. 05/27/1976 Primary Care Iverson Sees: 47 Other Clinician: Referring Milferd Ansell: Treating Zavon Hyson/Extender: Arta Silence Weeks in Treatment: 6 Edema Assessment Assessed: [Left: Yes] [Right: Yes] Edema: [Left: Yes] [Right: Yes] Calf Left: Right: Point of Measurement: 27 cm From Medial Instep 41 cm 42 cm Ankle Left: Right: Point of Measurement: 8 cm From Medial Instep 24.5 cm 25 cm Knee To Floor Left: Right: From Medial Instep 40 cm 40 cm Vascular Assessment Pulses: Dorsalis Pedis Palpable: [Left:Yes] [Right:Yes] Electronic Signature(s) Signed: 10/02/2020 5:13:18 PM By: Vickki Hearing Entered By: 10/04/2020 on 10/02/2020 12:57:54 -------------------------------------------------------------------------------- Multi Wound Chart Details Patient Name: Date of Service: Shawn Stall 10/02/2020 12:30 PM Medical Record Number: Laurann Montana Patient Account Number: 10/04/2020 Date of Birth/Sex: Treating RN: 21-Sep-1975 (45 y.o. 05/27/1976 Primary Care Jai Steil: 47 Other Clinician: Referring Adonys Wildes: Treating Rubby Barbary/Extender: Roel Cluck Weeks in Treatment: 6 Vital Signs Height(in): Pulse(bpm): 56 Weight(lbs): Blood  Pressure(mmHg): 157/86 Body Mass Index(BMI): Temperature(F): 97.9 Respiratory Rate(breaths/min): 18 Photos: [1:No Photos Left, Anterior Lower Leg] [N/A:N/A N/A] Wound Location: [1:Trauma] [N/A:N/A] Wounding Event: [1:Trauma, Other] [N/A:N/A] Primary Etiology: [1:Hypertension, Hepatitis C] [N/A:N/A] Comorbid History: [1:08/14/2020] [N/A:N/A] Date Acquired: [1:6] [N/A:N/A] Weeks of Treatment: [1:Healed - Epithelialized] [N/A:N/A] Wound Status: [1:0x0x0] [N/A:N/A] Measurements L x W x D (cm) [1:0] [N/A:N/A] A (cm) : rea [1:0] [N/A:N/A] Volume (cm) : [1:100.00%] [N/A:N/A] % Reduction in A rea: [1:100.00%] [N/A:N/A] % Reduction in Volume: [1:Full  Thickness Without Exposed] [N/A:N/A] Classification: [1:Support Structures Small] [N/A:N/A] Exudate Amount: [1:Serosanguineous] [N/A:N/A] Exudate Type: [1:red, brown] [N/A:N/A] Exudate Color: [1:Distinct, outline attached] [N/A:N/A] Wound Margin: [1:Large (67-100%)] [N/A:N/A] Granulation Amount: [1:Red] [N/A:N/A] Granulation Quality: [1:Small (1-33%)] [N/A:N/A] Necrotic Amount: [1:Fat Layer (Subcutaneous Tissue): Yes N/A] Exposed Structures: [1:Fascia: No Tendon: No Muscle: No Joint: No Bone: No] Treatment Notes Electronic Signature(s) Signed: 10/02/2020 2:05:29 PM By: Geralyn CorwinHoffman, Jessica DO Signed: 10/02/2020 6:31:57 PM By: Shawn Stalleaton, Bobbi Entered By: Geralyn CorwinHoffman, Jessica on 10/02/2020 13:59:30 -------------------------------------------------------------------------------- Multi-Disciplinary Care Plan Details Patient Name: Date of Service: [name], [name] 10/02/2020 12:30 PM Medical Record Number: [id number] Patient Account Number: [id number] Date of Birth/Sex: Treating RN: 1975-10-11 (45 y.o. Arta SilenceF) Deaton, Bobbi Primary Care Abygale Karpf: Hoy RegisterNewlin, Enobong Other Clinician: Referring Leelan Rajewski: Treating Micheale Schlack/Extender: Vickki HearingHoffman, Jessica Newlin, Enobong Weeks in Treatment: 6 Active Inactive Electronic Signature(s) Signed: 10/02/2020 6:31:57 PM By:  Shawn Stalleaton, Bobbi Entered By: Shawn Stalleaton, Bobbi on 10/02/2020 13:40:19 -------------------------------------------------------------------------------- Pain Assessment Details Patient Name: Date of Service: [name], [name] 10/02/2020 12:30 PM Medical Record Number: [id number] Patient Account Number: [id number] Date of Birth/Sex: Treating RN: 1975-10-11 (45 y.o. Roel CluckF) Barnhart, Jodi Primary Care Ulys Favia: Hoy RegisterNewlin, Enobong Other Clinician: Referring Nikhita Mentzel: Treating Kalie Cabral/Extender: Vickki HearingHoffman, Jessica Newlin, Enobong Weeks in Treatment: 6 Active Problems Location of Pain Severity and Description of Pain Patient Has Paino No Site Locations Pain Management and Medication Current Pain Management: Electronic Signature(s) Signed: 10/02/2020 5:13:18 PM By: Antonieta IbaBarnhart, Jodi Entered By: Antonieta IbaBarnhart, Jodi on 10/02/2020 12:54:43 -------------------------------------------------------------------------------- Patient/Caregiver Education Details Patient Name: Date of Service: [name], [name] 4/28/2022andnbsp12:30 PM Medical Record Number: [id number] Patient Account Number: [id number] Date of Birth/Gender: Treating RN: 1975-10-11 (45 y.o. Arta SilenceF) Deaton, Bobbi Primary Care Physician: Hoy RegisterNewlin, Enobong Other Clinician: Referring Physician: Treating Physician/Extender: Vickki HearingHoffman, Jessica Newlin, Enobong Weeks in Treatment: 6 Education Assessment Education Provided To: Patient Education Topics Provided Venous: Handouts: Controlling Swelling with Compression Stockings Methods: Explain/Verbal Responses: Reinforcements needed Electronic Signature(s) Signed: 10/02/2020 6:31:57 PM By: Shawn Stalleaton, Bobbi Entered By: Shawn Stalleaton, Bobbi on 10/02/2020 13:40:37 -------------------------------------------------------------------------------- Wound Assessment Details Patient Name: Date of Service: [name], [name] 10/02/2020 12:30 PM Medical Record Number: [id number] Patient Account Number: [id number] Date of Birth/Sex: Treating  RN: 1975-10-11 (45 y.o. Roel CluckF) Barnhart, Jodi Primary Care Farron Watrous: Hoy RegisterNewlin, Enobong Other Clinician: Referring Leonardo Plaia: Treating Denyla Cortese/Extender: Vickki HearingHoffman, Jessica Newlin, Enobong Weeks in Treatment: 6 Wound Status Wound Number: 1 Primary Etiology: Trauma, Other Wound Location: Left, Anterior Lower Leg Wound Status: Healed - Epithelialized Wounding Event: Trauma Comorbid History: Hypertension, Hepatitis C Date Acquired: 08/14/2020 Weeks Of Treatment: 6 Clustered Wound: No Photos Wound Measurements Length: (cm) Width: (cm) Depth: (cm) Area: (cm) Volume: (cm) 0 % Reduction in Area: 100% 0 % Reduction in Volume: 100% 0 0 0 Wound Description Classification: Full Thickness Without Exposed Support Structures Wound Margin: Distinct, outline attached Exudate Amount: Small Exudate Type: Serosanguineous Exudate Color: red, brown Foul Odor After Cleansing: No Slough/Fibrino Yes Wound Bed Granulation Amount: Large (67-100%) Exposed Structure Granulation Quality: Red Fascia Exposed: No Necrotic Amount: Small (1-33%) Fat Layer (Subcutaneous Tissue) Exposed: Yes Necrotic Quality: Adherent Slough Tendon Exposed: No Muscle Exposed: No Joint Exposed: No Bone Exposed: No Electronic Signature(s) Signed: 10/02/2020 5:13:18 PM By: Antonieta IbaBarnhart, Jodi Signed: 10/03/2020 4:35:52 PM By: Karl Itoawkins, Destiny Entered By: Karl Itoawkins, Destiny on 10/02/2020 16:28:20 -------------------------------------------------------------------------------- Vitals Details Patient Name: Date of Service: [name], [name] 10/02/2020 12:30 PM Medical Record Number: [id number] Patient Account Number: [id number] Date of Birth/Sex: Treating RN: 1975-10-11 (45 y.o. Roel CluckF) Barnhart, Jodi Primary Care Cesar Alf: Hoy RegisterNewlin, Enobong Other Clinician: Referring Trenna Kiely: Treating Sherilee Smotherman/Extender: Vickki HearingHoffman, Jessica Newlin, Enobong  Weeks in Treatment: 6 Vital Signs Time Taken: 12:54 Temperature (F): 97.9 Pulse (bpm):  56 Respiratory Rate (breaths/min): 18 Blood Pressure (mmHg): 157/86 Reference Range: 80 - 120 mg / dl Electronic Signature(s) Signed: 10/02/2020 5:13:18 PM By: Antonieta Iba Entered By: Antonieta Iba on 10/02/2020 12:54:35

## 2020-10-07 ENCOUNTER — Telehealth: Payer: Self-pay | Admitting: Family Medicine

## 2020-10-07 DIAGNOSIS — N920 Excessive and frequent menstruation with regular cycle: Secondary | ICD-10-CM

## 2020-10-07 NOTE — Telephone Encounter (Signed)
Copied from CRM [id number]. Topic: Referral - Question >> Oct 07, 2020  8:28 AM Leafy Ro wrote: Reason for CRM: Pt now has orange card again and [facility]

## 2020-10-07 NOTE — Telephone Encounter (Signed)
Pt requesting new referral

## 2020-10-07 NOTE — Telephone Encounter (Signed)
Pt is calling checking on the status of dermatologist referral

## 2020-10-08 ENCOUNTER — Other Ambulatory Visit: Payer: Self-pay

## 2020-10-08 DIAGNOSIS — N644 Mastodynia: Secondary | ICD-10-CM

## 2020-10-08 NOTE — Telephone Encounter (Signed)
Done

## 2020-10-09 ENCOUNTER — Ambulatory Visit: Payer: No Typology Code available for payment source

## 2020-10-09 NOTE — Addendum Note (Signed)
Addended by: Hoy Register on: 10/09/2020 04:12 PM   Modules accepted: Orders

## 2020-10-09 NOTE — Telephone Encounter (Signed)
Has blister under her arms.  Referral to dermatology.

## 2020-10-09 NOTE — Telephone Encounter (Signed)
Referred has been placed.  

## 2020-10-10 ENCOUNTER — Other Ambulatory Visit: Payer: Self-pay

## 2020-10-10 ENCOUNTER — Ambulatory Visit (HOSPITAL_COMMUNITY)
Admission: EM | Admit: 2020-10-10 | Discharge: 2020-10-10 | Disposition: A | Discharge status: Home / Self Care | Payer: No Typology Code available for payment source | Attending: Physician Assistant | Admitting: Physician Assistant

## 2020-10-10 ENCOUNTER — Encounter (HOSPITAL_COMMUNITY): Payer: Self-pay

## 2020-10-10 DIAGNOSIS — B1920 Unspecified viral hepatitis C without hepatic coma: Secondary | ICD-10-CM | POA: Insufficient documentation

## 2020-10-10 DIAGNOSIS — Z87898 Personal history of other specified conditions: Secondary | ICD-10-CM | POA: Insufficient documentation

## 2020-10-10 DIAGNOSIS — F1290 Cannabis use, unspecified, uncomplicated: Secondary | ICD-10-CM | POA: Insufficient documentation

## 2020-10-10 DIAGNOSIS — Z20822 Contact with and (suspected) exposure to covid-19: Secondary | ICD-10-CM | POA: Insufficient documentation

## 2020-10-10 DIAGNOSIS — F17210 Nicotine dependence, cigarettes, uncomplicated: Secondary | ICD-10-CM | POA: Insufficient documentation

## 2020-10-10 DIAGNOSIS — G894 Chronic pain syndrome: Secondary | ICD-10-CM | POA: Insufficient documentation

## 2020-10-10 DIAGNOSIS — R112 Nausea with vomiting, unspecified: Secondary | ICD-10-CM | POA: Insufficient documentation

## 2020-10-10 DIAGNOSIS — Z79899 Other long term (current) drug therapy: Secondary | ICD-10-CM | POA: Insufficient documentation

## 2020-10-10 DIAGNOSIS — F1910 Other psychoactive substance abuse, uncomplicated: Secondary | ICD-10-CM | POA: Insufficient documentation

## 2020-10-10 DIAGNOSIS — R030 Elevated blood-pressure reading, without diagnosis of hypertension: Secondary | ICD-10-CM

## 2020-10-10 LAB — SARS CORONAVIRUS 2 (TAT 6-24 HRS): SARS Coronavirus 2: NEGATIVE

## 2020-10-10 LAB — POCT URINALYSIS DIPSTICK, ED / UC
Glucose, UA: NEGATIVE mg/dL
Ketones, ur: >=160 mg/dL — AB
Leukocytes,Ua: NEGATIVE
Nitrite: NEGATIVE
Protein, ur: 30 mg/dL — AB
Specific Gravity, Urine: >=1.03 (ref 1.005–1.030)
Urobilinogen, UA: 1 mg/dL (ref 0.0–1.0)
pH: 5.5 (ref 5.0–8.0)

## 2020-10-10 LAB — POC URINE PREG, ED: Preg Test, Ur: NEGATIVE

## 2020-10-10 MED ORDER — HYDROXYZINE HCL 25 MG PO TABS
25.0000 mg | ORAL_TABLET | Freq: Three times a day (TID) | ORAL | 0 refills | Status: DC | PRN
  Filled 2020-10-10: qty 21, 7d supply, fill #0

## 2020-10-10 NOTE — Discharge Instructions (Addendum)
Use hydroxyzine up to 3 times a day as needed for anxiety.  This can make you sleepy so do not drive or drink alcohol with this.  Use Zofran you have at home to help manage nausea.  Please make sure you are drinking plenty of fluid and eat small frequent meals.  We will be in touch with your COVID-19 testing as soon as we have these results.  Please return with any worsening symptoms.  Your blood pressure was elevated.  Please monitor this at home if this remains elevated follow-up with your PCP.  If you have any headache, dizziness, chest pain, shortness of breath you need to be seen immediately.

## 2020-10-10 NOTE — ED Provider Notes (Signed)
[facility]    CSN: [id number] Arrival date & time: 10/10/20  0801      History   Chief Complaint Chief Complaint  Patient presents with  . Nausea  . Emesis    HPI Nancy Terry is a 45 y.o. female.   Patient presents today with 1 week history of intermittent nausea and vomiting.  Reports last episode of emesis was earlier this morning and described as mucus without hematemesis.  Reports she was able to keep fluids and food down yesterday.  She denies any known sick contacts.  She is concerned for COVID-19 as she initially had some headaches but these have improved.  She is up-to-date on her COVID-19 vaccinations including booster which was given November 2021.  She does report some medication changes as she has a history of opioid use disorder managed with methadone and had been taking alprazolam.  Reports taking 1 mg every other day for several weeks and has been without this medication for approximately 4 days.  She believes this could be contributing to symptoms.  She denies any seizure activity but does report worsening anxiety and feels as though her mind is racing and she is having trouble sleeping.  She denies any thoughts of suicide/self-harm.  Denies any urinary symptoms, abdominal pain, fever, changes in bowel habits, dizziness, syncope, chest pain, shortness of breath.  She does have Zofran and Phenergan available but has not been using these medications.  She has no additional complaints or concerns today.     Past Medical History:  Diagnosis Date  . Anxiety   . Depression   . Hepatitis C   . Herpes infection   . Hypertension   . Kidney stones   . Migraines   . Narcotic abuse [facility])     Patient Active Problem List   Diagnosis Date Noted  . Hidradenitis 10/10/2019  . Tobacco use 10/10/2019  . Chronic pain syndrome 04/11/2018  . Complex ovarian cyst 11/21/2017  . Hypertension 11/21/2017  . Menorrhagia 05/13/2017  . Sciatica 05/13/2017  . Migraines  05/07/2016  . Hepatitis C 05/07/2016  . Depression 04/05/2016  . Anxiety and depression 04/05/2016  . Narcotic abuse (HCC) 04/05/2016  . Breast lump on right side at 10 o'clock position 03/12/2014  . Pap smear of cervix shows high risk HPV present 03/12/2014    Past Surgical History:  Procedure Laterality Date  . BREAST BIOPSY    . ELBOW SURGERY Left   . KIDNEY STONE SURGERY     with stent placement  . LITHOTRIPSY    . TUBAL LIGATION      OB History    Gravida  3   Para  3   Term  3   Preterm  0   AB  0   Living        SAB  0   IAB  0   Ectopic  0   Multiple      Live Births               Home Medications    Prior to Admission medications   Medication Sig Start Date End Date Taking? Authorizing Provider  hydrOXYzine (ATARAX/VISTARIL) 25 MG tablet Take 1 tablet (25 mg total) by mouth every 8 (eight) hours as needed for anxiety. 10/10/20  Yes Honorio Devol K, PA-C  acetaminophen-codeine (TYLENOL #3) 300-30 MG tablet Take 1-2 tablets by mouth every 6 (six) hours as needed for moderate pain. 05/02/20   Eustace Moore, MD  amoxicillin (AMOXIL) 500 MG capsule TAKE 1 CAPSULE BY MOUTH TWICE A DAY 01/28/20 01/27/21  [place], Amy, MD  furosemide (LASIX) 20 MG tablet Take 1 tablet (20 mg total) by mouth daily. 05/02/20   Eustace MooreNelson, Yvonne Sue, MD  ibuprofen (ADVIL) 200 MG tablet Take 200 mg by mouth every 6 (six) hours as needed.     [provider]  methadone (DOLOPHINE) 10 MG/5ML solution Take 130 mg by mouth every other day. 110 mg every other day    [provider]  silver sulfADIAZINE (SILVADENE) 1 % cream Apply 1 application topically daily. 05/02/20   Eustace MooreNelson, Yvonne Sue, MD  triamcinolone cream (KENALOG) 0.5 % APPLY 1 APPLICATION ON THE SKIN TWICE A DAY 01/28/20 01/27/21  [place], Amy, MD    Family History Family History  Problem Relation Age of Onset  . Parkinson's disease Mother   . Hypertension Mother   . Rheum arthritis Mother   . Heart  failure Mother   . Parkinson's disease Maternal Grandmother   . Hypertension Maternal Grandmother   . Glaucoma Maternal Grandmother   . Lung cancer Maternal Grandmother   . Colon cancer Neg Hx   . Stomach cancer Neg Hx   . Pancreatic cancer Neg Hx   . Esophageal cancer Neg Hx     Social History Social History   Tobacco Use  . Smoking status: Current Every Day Smoker    Packs/day: 1.00    Years: 0.50    Pack years: 0.50    Types: Cigarettes  . Smokeless tobacco: Never Used  Vaping Use  . Vaping Use: Never used  Substance Use Topics  . Alcohol use: No  . Drug use: Not Currently    Types: Heroin, Marijuana    Comment:  smoked marijuana 4 days ago.  hasn't used heroin since 06/01/2014     Allergies   Haldol [haloperidol decanoate] and Nitrates, organic   Review of Systems Review of Systems  Constitutional: Positive for activity change and appetite change. Negative for fatigue and fever.  HENT: Negative for congestion, sinus pressure, sneezing and sore throat.   Respiratory: Negative for cough and shortness of breath.   Cardiovascular: Negative for chest pain.  Gastrointestinal: Positive for nausea and vomiting. Negative for abdominal pain, constipation and diarrhea.  Genitourinary: Negative for dysuria, flank pain, frequency, urgency, vaginal bleeding, vaginal discharge and vaginal pain.  Neurological: Positive for headaches. Negative for dizziness and light-headedness.     Physical Exam Triage Vital Signs ED Triage Vitals  Enc Vitals Group     BP 10/10/20 0817 (!) 151/72     Pulse Rate 10/10/20 0815 62     Resp 10/10/20 0815 17     Temp 10/10/20 0815 98.4 F (36.9 C)     Temp src --      SpO2 10/10/20 0815 100 %     Weight --      Height --      Head Circumference --      Peak Flow --      Pain Score 10/10/20 0814 0     Pain Loc --      Pain Edu? --      Excl. in GC? --    No data found.  Updated Vital Signs BP (!) 151/72   Pulse 62   Temp 98.4 F  (36.9 C)   Resp 17   LMP 09/27/2020 (Approximate)   SpO2 100%   Visual Acuity Right Eye Distance:   Left Eye Distance:  Bilateral Distance:    Right Eye Near:   Left Eye Near:    Bilateral Near:     Physical Exam Vitals reviewed.  Constitutional:      General: She is awake. She is not in acute distress.    Appearance: Normal appearance. She is not ill-appearing.     Comments: Very pleasant female appears stated age in no acute distress  HENT:     Head: Normocephalic and atraumatic.  Cardiovascular:     Rate and Rhythm: Normal rate and regular rhythm.     Heart sounds: No murmur heard.   Pulmonary:     Effort: Pulmonary effort is normal.     Breath sounds: Normal breath sounds. No wheezing, rhonchi or rales.     Comments: Clear to auscultation bilaterally Abdominal:     General: Bowel sounds are normal.     Palpations: Abdomen is soft.     Tenderness: There is no abdominal tenderness. There is no right CVA tenderness, left CVA tenderness, guarding or rebound.     Comments: Benign abdominal exam  Psychiatric:        Mood and Affect: Mood is anxious. Affect is tearful.        Speech: Speech normal.        Behavior: Behavior is cooperative.     Comments: Maintains eye contact.  Appropriate speech.  Tearful and anxious.      UC Treatments / Results  Labs (all labs ordered are listed, but only abnormal results are displayed) Labs Reviewed  POCT URINALYSIS DIPSTICK, ED / UC - Abnormal; Notable for the following components:      Result Value   Bilirubin Urine SMALL (*)    Ketones, ur >=160 (*)    Hgb urine dipstick MODERATE (*)    Protein, ur 30 (*)    All other components within normal limits  SARS CORONAVIRUS 2 (TAT 6-24 HRS)  URINE CULTURE  POC URINE PREG, ED    EKG   Radiology No results found.  Procedures Procedures (including critical care time)  Medications Ordered in UC Medications - No data to display  Initial Impression / Assessment and  Plan / UC Course  I have reviewed the triage vital signs and the nursing notes.  Pertinent labs & imaging results that were available during my care of the patient were reviewed by me and considered in my medical decision making (see chart for details).     Vital signs and physical exam reassuring today; no indication for emergent evaluation or imaging.  UA consistent with decreased oral intake/nausea vomiting.  We will send urine culture to ensure no infection.  Urine pregnancy negative.  Suspect symptoms are related to anxiety and benzodiazepine withdrawal.  Discussed that typically she should wean off of benzodiazepines but if she has been multiple days without medication and she been taking a low-dose it is reasonable to stop this medication completely at this point.  She has nausea medication at home and was encouraged to use this as previously prescribed.  Discussed the importance of drinking plenty of fluid and eating bland diet.  She was given hydroxyzine to help with anxiety/insomnia with instruction not to drive or drink alcohol with this medication as drowsiness is a common side effect.  Strict return precautions given to which patient expressed understanding.  Blood pressure was elevated today.  Suspect this is related to anxiety and illness.  Patient denies any alarm symptoms.  She was encouraged to monitor this at home and follow-up  with PCP if this remains elevated.  Strict return precautions given to which patient expressed understanding.  Final Clinical Impressions(s) / UC Diagnoses   Final diagnoses:  Nausea and vomiting, intractability of vomiting not specified, unspecified vomiting type  Substance abuse (HCC)  History of benzodiazepine use  Elevated blood pressure reading     Discharge Instructions     Use hydroxyzine up to 3 times a day as needed for anxiety.  This can make you sleepy so do not drive or drink alcohol with this.  Use Zofran you have at home to help manage  nausea.  Please make sure you are drinking plenty of fluid and eat small frequent meals.  We will be in touch with your COVID-19 testing as soon as we have these results.  Please return with any worsening symptoms.  Your blood pressure was elevated.  Please monitor this at home if this remains elevated follow-up with your PCP.  If you have any headache, dizziness, chest pain, shortness of breath you need to be seen immediately.    ED Prescriptions    Medication Sig Dispense Auth. Provider   hydrOXYzine (ATARAX/VISTARIL) 25 MG tablet Take 1 tablet (25 mg total) by mouth every 8 (eight) hours as needed for anxiety. 21 tablet Andreas Sobolewski, Noberto Retort, PA-C     PDMP not reviewed this encounter.   Jeani Hawking, PA-C 10/10/20 [phone]

## 2020-10-10 NOTE — Telephone Encounter (Signed)
Pt was called and informed of referral being placed. ?

## 2020-10-10 NOTE — ED Triage Notes (Signed)
Pt in with c/o N/V that has been going on all week  Pt states she is not able to keep her medicine down  Denies fever, cough, congestion, runny nose

## 2020-10-11 LAB — URINE CULTURE: Culture: NO GROWTH

## 2020-10-14 ENCOUNTER — Ambulatory Visit: Payer: No Typology Code available for payment source

## 2020-10-14 NOTE — Progress Notes (Signed)
Nancy Terry ([id number]) , Visit Report for 10/02/2020 Chief Complaint Document Details Patient Name: Date of Service: Nancy Terry, Nancy Terry 10/02/2020 12:30 PM Medical Record Number: [id number] Patient Account Number: [id number] Date of Birth/Sex: Treating RN: [birth year] (45 y.o. Nancy Terry Primary Care Provider: Hoy Register Other Clinician: Referring Provider: Treating Provider/Extender: Vickki Hearing Weeks in Treatment: 6 Information Obtained from: Patient Chief Complaint 08/21/2020; patient is here with a wound on the left anterior mid tibia Electronic Signature(s) Signed: 10/02/2020 2:05:29 PM By: Geralyn Corwin DO Entered By: Geralyn Corwin on 10/02/2020 13:59:35 -------------------------------------------------------------------------------- HPI Details Patient Name: Date of Service: Nancy Terry, Nancy Terry 10/02/2020 12:30 PM Medical Record Number: [id number] Patient Account Number: [id number] Date of Birth/Sex: Treating RN: 1975-09-02 (45 y.o. 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 She was originally referred here apparently for an area on the right lateral and posterior lower calf which was prompted by a fall while walking dogs. She was seen in the ER on 05/02/2020 for this she was given Silvadene and Septra and I gather these areas closed over. Right leg x-ray was negative. HOWEVER she had another fall a week ago again while walking her dog she has an area on the left anterior mid tibia. She tells me she has had a long standing history of edema in her legs that is but not easy to control. She has wound history. She does not wear compression of any type. No history of a DVT reviewing [place]  link. Past medical history includes hypertension, migraines, chronic venous insufficiency, hidradenitis, chronic pain on methadone, chronic hep C ABIs in our clinic were 1.03 on the right and 1.1 on the left 4/28; patient was last seen on 3/17 for a left lower extremity wound. She has since healed. She presents today for further advice on compression stockings. She had venous reflux studies done since her last clinic visit. Electronic Signature(s) Signed: 10/02/2020 2:05:29 PM By: Geralyn Corwin DO Entered By: Geralyn Corwin on 10/02/2020 14:02:34 -------------------------------------------------------------------------------- Physical Exam Details Patient Name: Date of Service: Nancy Terry, Nancy Terry 10/02/2020 12:30 PM Medical Record Number: [id number] Patient Account Number: [id number] Date of Birth/Sex: Treating RN: 1976/03/12 (45 y.o. Nancy Terry Primary Care Provider: Hoy Register Other Clinician: Referring Provider: Treating Provider/Extender: Vickki Hearing Weeks in Treatment: 6 Constitutional respirations regular, non-labored and within target range for patient.Marland Kitchen Psychiatric pleasant and cooperative. Notes No open wounds. Previous left lower extremity wound site is epithelialized. 2+ pitting edema to the shins with venous stasis dermatitis bilaterally Electronic Signature(s) Signed: 10/02/2020 2:05:29 PM By: Geralyn Corwin DO Entered By: Geralyn Corwin on 10/02/2020 14:02:54 -------------------------------------------------------------------------------- Physician Orders Details Patient Name: Date of Service: Nancy Terry, Nancy Terry 10/02/2020 12:30 PM Medical Record Number: [id number] Patient Account Number: [id number] Date of Birth/Sex: Treating RN: 01-07-1976 (45 y.o. Nancy Terry Primary Care Provider: Hoy Register Other Clinician: Referring Provider: Treating Provider/Extender: Vickki Hearing Weeks in Treatment: 6 Verbal / Phone  Orders: No Diagnosis Coding ICD-10 Coding Code Description I89.0 Lymphedema, not elsewhere classified I87.332 Chronic venous hypertension (idiopathic) with ulcer and inflammation of left lower extremity L97.828 Non-pressure chronic ulcer of other part of left lower leg with other specified severity Discharge From [facility] Services Discharge from Wound Care Center Edema Control - Lymphedema / SCD / Other Elevate legs to  the level of the heart or above for 30 minutes daily and/or when sitting, a frequency of: - throughout the day. Avoid standing for long periods of time. Patient to wear own compression stockings every day. Exercise regularly Moisturize legs daily. - every night before bed. Compression stocking or Garment 20-30 mm/Hg pressure to: - Patient to purchase compression stockings 20-5mmHg. Apply in the morning and remove at night. Electronic Signature(s) Signed: 10/02/2020 2:05:29 PM By: Geralyn Corwin DO Entered By: Geralyn Corwin on 10/02/2020 14:03:10 -------------------------------------------------------------------------------- Problem List Details Patient Name: Date of Service: Nancy Terry, Nancy Terry 10/02/2020 12:30 PM Medical Record Number: [id number] Patient Account Number: [id number] Date of Birth/Sex: Treating RN: 11/07/1975 (45 y.o. Nancy Terry Primary Care Provider: Hoy Register Other Clinician: Referring Provider: Treating Provider/Extender: Vickki Hearing Weeks in Treatment: 6 Active Problems ICD-10 Encounter Code Description Active Date MDM Diagnosis I89.0 Lymphedema, not elsewhere classified 08/21/2020 No Yes I87.332 Chronic venous hypertension (idiopathic) with ulcer and inflammation of left 08/21/2020 No Yes lower extremity L97.828 Non-pressure chronic ulcer of other part of left lower leg with other specified 08/21/2020 No Yes severity Inactive Problems Resolved Problems Electronic Signature(s) Signed: 10/02/2020 2:05:29 PM By:  Geralyn Corwin DO Entered By: Geralyn Corwin on 10/02/2020 13:59:26 -------------------------------------------------------------------------------- Progress Note Details Patient Name: Date of Service: Nancy Terry 10/02/2020 12:30 PM Medical Record Number: [id number] Patient Account Number: [id number] Date of Birth/Sex: Treating RN: 1976-02-21 (45 y.o. 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 She was originally referred here apparently for an area on the right lateral and posterior lower calf which was prompted by a fall while walking dogs. She was seen in the ER on 05/02/2020 for this she was given Silvadene and Septra and I gather these areas closed over. Right leg x-ray was negative. HOWEVER she had another fall a week ago again while walking her dog she has an area on the left anterior mid tibia. She tells me she has had a long standing history of edema in her legs that is but not easy to control. She has wound history. She does not wear compression of any type. No history of a DVT reviewing [place] link. Past medical history includes hypertension, migraines, chronic venous insufficiency, hidradenitis, chronic pain on methadone, chronic hep C ABIs in our clinic were 1.03 on the right and 1.1 on the left 4/28; patient was last seen on 3/17 for a left lower extremity wound. She has since healed. She presents today for further advice on compression stockings. She had venous reflux studies done since her last clinic visit. Patient History Information obtained from Patient. Family History Cancer -  Maternal Grandparents, Heart Disease - Mother, Hypertension - Maternal Grandparents, Lung Disease - Maternal Grandparents, No family history of Diabetes, Hereditary Spherocytosis, Kidney Disease, Seizures, Stroke, Thyroid Problems, Tuberculosis. Social History Current every day smoker, Marital Status - Divorced, Alcohol Use - Never, Drug Use - Prior History, Caffeine Use - Daily. Medical History Cardiovascular Patient has history of Hypertension Gastrointestinal Patient has history of Hepatitis C Integumentary (Skin) Denies history of History of Burn Medical A Surgical History Notes nd Genitourinary Complex Ovarian Cyst Neurologic Sciatica, Migraines Psychiatric anxiety/depression Objective Constitutional respirations regular, non-labored and  within target range for patient.. Vitals Time Taken: 12:54 PM, Temperature: 97.9 F, Pulse: 56 bpm, Respiratory Rate: 18 breaths/min, Blood Pressure: 157/86 mmHg. Psychiatric pleasant and cooperative. General Notes: No open wounds. Previous left lower extremity wound site is epithelialized. 2+ pitting edema to the shins with venous stasis dermatitis bilaterally Integumentary (Hair, Skin) Wound #1 status is Healed - Epithelialized. Original cause of wound was Trauma. The date acquired was: 08/14/2020. The wound has been in treatment 6 weeks. The wound is located on the Left,Anterior Lower Leg. The wound measures 0cm length x 0cm width x 0cm depth; 0cm^2 area and 0cm^3 volume. There is Fat Layer (Subcutaneous Tissue) exposed. There is a small amount of serosanguineous drainage noted. The wound margin is distinct with the outline attached to the wound base. There is large (67-100%) red granulation within the wound bed. There is a small (1-33%) amount of necrotic tissue within the wound bed including Adherent Slough. Assessment Active Problems ICD-10 Lymphedema, not elsewhere classified Chronic venous hypertension (idiopathic) with ulcer and  inflammation of left lower extremity Non-pressure chronic ulcer of other part of left lower leg with other specified severity Patient was last seen over a month ago. Her wound has healed since then. We gave her information on compression stockings and where to get them. I went over the venous reflux studies results with the patient. There is no reflux or DVT noted at each segment. Plan Discharge From [facility] Services: Discharge from Wound Care Center Edema Control - Lymphedema / SCD / Other: Elevate legs to the level of the heart or above for 30 minutes daily and/or when sitting, a frequency of: - throughout the day. Avoid standing for long periods of time. Patient to wear own compression stockings every day. Exercise regularly Moisturize legs daily. - every night before bed. Compression stocking or Garment 20-30 mm/Hg pressure to: - Patient to purchase compression stockings 20-58mmHg. Apply in the morning and remove at night. 1. Use compression stockings daily 2. Discharge from our office today. Electronic Signature(s) Signed: 10/02/2020 2:05:29 PM By: Geralyn Corwin DO Entered By: Geralyn Corwin on 10/02/2020 14:04:20 -------------------------------------------------------------------------------- HxROS Details Patient Name: Date of Service: Nancy Terry, Nancy Terry 10/02/2020 12:30 PM Medical Record Number: [id number] Patient Account Number: [id number] Date of Birth/Sex: Treating RN: 15-Nov-1975 (45 y.o. Nancy Terry Primary Care Provider: Hoy Register Other Clinician: Referring Provider: Treating Provider/Extender: Vickki Hearing Weeks in Treatment: 6 Information Obtained From Patient Cardiovascular Medical History: Positive for: Hypertension Gastrointestinal Medical History: Positive for: Hepatitis C Genitourinary Medical History: Past Medical History Notes: Complex Ovarian Cyst Integumentary (Skin) Medical History: Negative for: History of  Burn Neurologic Medical History: Past Medical History Notes: Sciatica, Migraines Psychiatric Medical History: Past Medical History Notes: anxiety/depression Immunizations Pneumococcal Vaccine: Received Pneumococcal Vaccination: No Implantable Devices None Family and Social History Cancer: Yes - Maternal Grandparents; Diabetes: No; Heart Disease: Yes - Mother; Hereditary Spherocytosis: No; Hypertension: Yes - Maternal Grandparents; Kidney Disease: No; Lung Disease: Yes - Maternal Grandparents; Seizures: No; Stroke: No; Thyroid Problems: No; Tuberculosis: No; Current every day smoker; Marital Status - Divorced; Alcohol Use: Never; Drug Use: Prior History; Caffeine Use: Daily; Financial Concerns: No; Food, Clothing or Shelter Needs: No; Support System Lacking: No; Transportation Concerns: No Electronic Signature(s) Signed: 10/02/2020 2:05:29 PM By: Geralyn Corwin DO Signed: 10/02/2020 6:31:57 PM By: Shawn Stall Entered By: Geralyn Corwin on 10/02/2020 14:02:40 -------------------------------------------------------------------------------- SuperBill Details Patient Name: Date of Service: Nancy Terry, Nancy Terry 10/02/2020 Medical Record Number: [id number] Patient Account Number: [id number] Date of Birth/Sex:  Treating RN: 1975/09/30 (45 y.o. Nancy SilenceF) Deaton, Bobbi Primary Care Provider: Hoy RegisterNewlin, Enobong Other Clinician: Referring Provider: Treating Provider/Extender: Vickki HearingHoffman, Said Rueb Newlin, Enobong Weeks in Treatment: 6 Diagnosis Coding ICD-10 Codes Code Description I89.0 Lymphedema, not elsewhere classified I87.332 Chronic venous hypertension (idiopathic) with ulcer and inflammation of left lower extremity L97.828 Non-pressure chronic ulcer of other part of left lower leg with other specified severity Facility Procedures CPT4 Code: [id number] Description: 99213 - WOUND CARE VISIT-LEV 3 EST PT Modifier: Quantity: 1 Electronic Signature(s) Signed: 10/02/2020 6:31:57 PM By: Shawn Stalleaton,  Bobbi Signed: 10/14/2020 3:23:56 PM By: Geralyn CorwinHoffman, Samari Gorby DO Previous Signature: 10/02/2020 2:05:29 PM Version By: Geralyn CorwinHoffman, Jonathan Kirkendoll DO Entered By: Shawn Stalleaton, Bobbi on 10/02/2020 18:04:24

## 2020-10-16 ENCOUNTER — Other Ambulatory Visit: Payer: No Typology Code available for payment source

## 2020-10-16 ENCOUNTER — Encounter (HOSPITAL_BASED_OUTPATIENT_CLINIC_OR_DEPARTMENT_OTHER): Payer: Medicaid Other | Admitting: Internal Medicine

## 2020-11-18 ENCOUNTER — Encounter: Payer: Self-pay | Admitting: Family Medicine

## 2020-11-18 ENCOUNTER — Other Ambulatory Visit: Payer: Self-pay

## 2020-11-18 ENCOUNTER — Ambulatory Visit: Payer: Self-pay | Attending: Family Medicine | Admitting: Family Medicine

## 2020-11-18 DIAGNOSIS — L732 Hidradenitis suppurativa: Secondary | ICD-10-CM

## 2020-11-18 MED ORDER — CLINDAMYCIN PHOSPHATE 1 % EX LOTN
TOPICAL_LOTION | Freq: Two times a day (BID) | CUTANEOUS | 6 refills | Status: DC
  Filled 2020-11-18 – 2021-10-09 (×3): qty 60, 30d supply, fill #0
  Filled 2021-11-10: qty 60, 30d supply, fill #1

## 2020-11-18 NOTE — Progress Notes (Signed)
Virtual Visit via Telephone Note  I connected with Nancy Terry, on 11/18/2020 at 1:33 PM by telephone due to the COVID-19 pandemic and verified that I am speaking with the correct person using two identifiers.   Consent: I discussed the limitations, risks, security and privacy concerns of performing an evaluation and management service by telephone and the availability of in person appointments. I also discussed with the patient that there may be a patient responsible charge related to this service. The patient expressed understanding and agreed to proceed.   Location of Patient: Out and about  Location of Provider: Clinic   Persons participating in Telemedicine visit: Alannis Hsia Dr. 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   She saw [facility] a year ago and requested referral back to dermatology for management of skin lesions underneath her arms.  Dermatology had declined the referral stating she needed to be managed by PCP. I reviewed [facility] dermatology notes from 10/2019 when she was diagnosed with hidradenitis suppurativa and she was prescribed clindamycin lotion, doxycycline and Mupirocin ointment. She requests a refill of the clindamycin. Past Medical History:  Diagnosis Date   Anxiety    Depression    Hepatitis C    Herpes infection    Hypertension    Kidney stones    Migraines    Narcotic abuse (HCC)    Allergies  Allergen Reactions   Haldol [Haloperidol Decanoate] Other (See Comments)    Hallucinations   Nitrates, Organic Other (See Comments)    headaches    Current Outpatient Medications on File Prior to Visit  Medication Sig Dispense Refill   acetaminophen-codeine (TYLENOL #3) 300-30 MG tablet Take  1-2 tablets by mouth every 6 (six) hours as needed for moderate pain. 15 tablet 0   amoxicillin (AMOXIL) 500 MG capsule TAKE 1 CAPSULE BY MOUTH TWICE A DAY 28 capsule 2   furosemide (LASIX) 20 MG tablet Take 1 tablet (20 mg total) by mouth daily. 15 tablet 0   hydrOXYzine (ATARAX/VISTARIL) 25 MG tablet Take 1 tablet (25 mg total) by mouth every 8 (eight) hours as needed for anxiety. 21 tablet 0   ibuprofen (ADVIL) 200 MG tablet Take 200 mg by mouth every 6 (six) hours as needed.      methadone (DOLOPHINE) 10 MG/5ML solution Take 130 mg by mouth every other day. 110 mg every other day     silver sulfADIAZINE (SILVADENE) 1 % cream Apply 1 application topically daily. 50 g 0   triamcinolone cream (KENALOG) 0.5 % APPLY 1 APPLICATION ON THE SKIN TWICE A DAY 454 g 2   No current facility-administered medications on file prior to visit.    ROS: See HPI  Observations/Objective: Awake, alert, oriented x3 Not in acute distress Normal mood  Assessment and Plan: 1. Hidradenitis suppurativa Uncontrolled Refilled clindamycin - clindamycin (CLEOCIN T) 1 % lotion; Apply topically 2 (two) times daily.  Dispense: 60 mL; Refill: 6   Follow Up Instructions: Keep previously scheduled appointment   I discussed the assessment and treatment plan with the patient. The patient was provided an opportunity to ask questions and all were answered. The patient agreed with the plan and demonstrated an understanding of the instructions.   The patient was advised to call back or seek an in-person evaluation  if the symptoms worsen or if the condition fails to improve as anticipated.     I provided 11 minutes total of non-face-to-face time during this encounter.   Hoy Register, MD, FAAFP. [facility] [place], [place] [phone]   11/18/2020, 1:33 PM

## 2020-11-19 ENCOUNTER — Other Ambulatory Visit: Payer: Self-pay

## 2020-11-25 ENCOUNTER — Ambulatory Visit: Payer: No Typology Code available for payment source

## 2020-11-25 ENCOUNTER — Other Ambulatory Visit: Payer: No Typology Code available for payment source

## 2020-11-26 ENCOUNTER — Other Ambulatory Visit: Payer: Self-pay

## 2020-12-11 ENCOUNTER — Ambulatory Visit (HOSPITAL_BASED_OUTPATIENT_CLINIC_OR_DEPARTMENT_OTHER): Payer: No Typology Code available for payment source | Admitting: Obstetrics & Gynecology

## 2021-01-28 ENCOUNTER — Ambulatory Visit (INDEPENDENT_AMBULATORY_CARE_PROVIDER_SITE_OTHER): Payer: Self-pay | Admitting: Pharmacist

## 2021-01-28 ENCOUNTER — Other Ambulatory Visit: Payer: Self-pay

## 2021-01-28 DIAGNOSIS — B182 Chronic viral hepatitis C: Secondary | ICD-10-CM

## 2021-01-28 NOTE — Progress Notes (Signed)
.  HPI: Nancy Terry is a 45 y.o. female who presents to the [place] clinic for Hepatitis C treatment.  Patient Active Problem List   Diagnosis Date Noted   Hidradenitis 10/10/2019   Tobacco use 10/10/2019   Chronic pain syndrome 04/11/2018   Complex ovarian cyst 11/21/2017   Hypertension 11/21/2017   Menorrhagia 05/13/2017   Sciatica 05/13/2017   Migraines 05/07/2016   Hepatitis C 05/07/2016   Depression 04/05/2016   Anxiety and depression 04/05/2016   Narcotic abuse ([place]) 04/05/2016   Breast lump on right side at 10 o'clock position 03/12/2014   Pap smear of cervix shows high risk HPV present 03/12/2014    Patient's Medications  New Prescriptions   No medications on file  Previous Medications   ACETAMINOPHEN-CODEINE (TYLENOL #3) 300-30 MG TABLET    Take 1-2 tablets by mouth every 6 (six) hours as needed for moderate pain.   CLINDAMYCIN (CLEOCIN T) 1 % LOTION    Apply topically 2 (two) times daily.   FUROSEMIDE (LASIX) 20 MG TABLET    Take 1 tablet (20 mg total) by mouth daily.   HYDROXYZINE (ATARAX/VISTARIL) 25 MG TABLET    Take 1 tablet (25 mg total) by mouth every 8 (eight) hours as needed for anxiety.   IBUPROFEN (ADVIL) 200 MG TABLET    Take 200 mg by mouth every 6 (six) hours as needed.    METHADONE (DOLOPHINE) 10 MG/5ML SOLUTION    Take 130 mg by mouth every other day. 110 mg every other day   SILVER SULFADIAZINE (SILVADENE) 1 % CREAM    Apply 1 application topically daily.  Modified Medications   No medications on file  Discontinued Medications   No medications on file    Allergies: Allergies  Allergen Reactions   Haldol [Haloperidol Decanoate] Other (See Comments)    Hallucinations   Nitrates, Organic Other (See Comments)    headaches    Past Medical History: Past Medical History:  Diagnosis Date   Anxiety    Depression    Hepatitis C    Herpes infection    Hypertension    Kidney stones    Migraines    Narcotic abuse ([place])     Social  History: Social History   Socioeconomic History   Marital status: Unknown    Spouse name: Not on file   Number of children: 3   Years of education: Not on file   Highest education level: 9th grade  Occupational History   Not on file  Tobacco Use   Smoking status: Every Day    Packs/day: 1.00    Years: 0.50    Pack years: 0.50    Types: Cigarettes   Smokeless tobacco: Never  Vaping Use   Vaping Use: Never used  Substance and Sexual Activity   Alcohol use: No   Drug use: Not Currently    Types: Heroin, Marijuana    Comment:  smoked marijuana 4 days ago.  hasn't used heroin since 06/01/2014   Sexual activity: Not Currently    Birth control/protection: Surgical  Other Topics Concern   Not on file  Social History Narrative   ** Merged History Encounter **       Social Determinants of Health   Financial Resource Strain: Not on file  Food Insecurity: No Food Insecurity   Worried About Charity fundraiser in the Last Year: Never true   Ran Out of Food in the Last Year: Never true  Transportation Needs: No Transportation Needs  Lack of Transportation (Medical): No   Lack of Transportation (Non-Medical): No  Physical Activity: Not on file  Stress: Not on file  Social Connections: Not on file    Labs: Hepatitis C Lab Results  Component Value Date   HCVGENOTYPE 1a 01/03/2019   HCVRNAPCRQN 356,000 (H) 01/03/2019   FIBROSTAGE F0 01/03/2019   Hepatitis B Lab Results  Component Value Date   HEPBSAG NON-REACTIVE 01/03/2019   Hepatitis A No results found for: HAV HIV Lab Results  Component Value Date   HIV NON-REACTIVE 01/03/2019   HIV Non Reactive 03/27/2018   Lab Results  Component Value Date   CREATININE 0.74 03/27/2020   CREATININE 0.76 02/12/2020   CREATININE 0.66 10/09/2019   CREATININE 0.88 08/29/2019   CREATININE 0.78 12/10/2018   Lab Results  Component Value Date   AST 23 10/09/2019   AST 29 12/10/2018   AST 58 (H) 08/26/2018   ALT 31  10/09/2019   ALT 41 (H) 01/03/2019   ALT 27 12/10/2018    Assessment: Rayleigh presents to clinic today to start hepatitis C treatment. She previously met with Cassie and Dr. Prince Rome in 2021 and was prescribed Harvoni x 8 weeks with genotype 1a, F0 fibrosis score, and HCV RNA 356,000. She never started the [facility] as she was taking care of her mother and was experiencing intense nausea/vomiting. She was seeing GI for this and was scoped without any significant findings. She stated she vomits every morning and that it seems to be getting more intense recently. She also feels "not like herself", very fatigued throughout the day, and always bloated. I told her the fatigue could very well be due to her chronic hepatitis C infection. Of note, she is also taking methadone which she is slowly titrating down with her methadone clinic.  Will check all pertinent labs today to determine the most appropriate regimen for her. She may be able to still take Harvoni x 8 weeks. She states she has her unopened bottles at home. I told her she can bring them in to the clinic if they are sealed, and we can examine the expiration dates.   Plan: Check HCV RNA, HCV genotype, fibrosis score, CBC, Cmet, and INR Follow-up with results to determine most appropriate treatment regimen  Alfonse Spruce, PharmD, CPP Clinical Pharmacist Practitioner Infectious Diseases [facility] 01/28/2021, 11:01 AM

## 2021-02-02 LAB — COMPREHENSIVE METABOLIC PANEL
AG Ratio: 1.4 (calc) (ref 1.0–2.5)
ALT: 11 U/L (ref 6–29)
AST: 14 U/L (ref 10–35)
Albumin: 4.3 g/dL (ref 3.6–5.1)
Alkaline phosphatase (APISO): 63 U/L (ref 31–125)
BUN: 7 mg/dL (ref 7–25)
CO2: 29 mmol/L (ref 20–32)
Calcium: 9.3 mg/dL (ref 8.6–10.2)
Chloride: 104 mmol/L (ref 98–110)
Creat: 0.7 mg/dL (ref 0.50–0.99)
Globulin: 3 g/dL (calc) (ref 1.9–3.7)
Glucose, Bld: 96 mg/dL (ref 65–99)
Potassium: 3.9 mmol/L (ref 3.5–5.3)
Sodium: 138 mmol/L (ref 135–146)
Total Bilirubin: 0.6 mg/dL (ref 0.2–1.2)
Total Protein: 7.3 g/dL (ref 6.1–8.1)

## 2021-02-02 LAB — LIVER FIBROSIS, FIBROTEST-ACTITEST
ALT: 13 U/L (ref 6–29)
Alpha-2-Macroglobulin: 121 mg/dL (ref 106–279)
Apolipoprotein A1: 96 mg/dL — ABNORMAL LOW (ref 101–198)
Bilirubin: 0.5 mg/dL (ref 0.2–1.2)
Fibrosis Score: 0.06
GGT: 8 U/L (ref 3–55)
Haptoglobin: 166 mg/dL (ref 43–212)
Necroinflammat ACT Score: 0.03
Reference ID: 4002277

## 2021-02-02 LAB — PROTIME-INR
INR: 1.1
Prothrombin Time: 11.4 s (ref 9.0–11.5)

## 2021-02-02 LAB — CBC
HCT: 32.8 % — ABNORMAL LOW (ref 35.0–45.0)
Hemoglobin: 9.7 g/dL — ABNORMAL LOW (ref 11.7–15.5)
MCH: 20.4 pg — ABNORMAL LOW (ref 27.0–33.0)
MCHC: 29.6 g/dL — ABNORMAL LOW (ref 32.0–36.0)
MCV: 69.1 fL — ABNORMAL LOW (ref 80.0–100.0)
MPV: 9.2 fL (ref 7.5–12.5)
Platelets: 411 10*3/uL — ABNORMAL HIGH (ref 140–400)
RBC: 4.75 10*6/uL (ref 3.80–5.10)
RDW: 15.8 % — ABNORMAL HIGH (ref 11.0–15.0)
WBC: 7.2 10*3/uL (ref 3.8–10.8)

## 2021-02-02 LAB — HEPATITIS C RNA QUANTITATIVE
HCV Quantitative Log: 3.48 log IU/mL — ABNORMAL HIGH
HCV RNA, PCR, QN: 3000 IU/mL — ABNORMAL HIGH

## 2021-02-02 LAB — HEPATITIS C GENOTYPE

## 2021-02-11 ENCOUNTER — Telehealth: Payer: Self-pay | Admitting: Clinical

## 2021-02-11 ENCOUNTER — Other Ambulatory Visit: Payer: Self-pay

## 2021-02-11 NOTE — Telephone Encounter (Signed)
Integrated Behavioral Health Case Management Referral Note  02/11/2021 Name: Nancy Terry MRN: [id number] DOB: 02-Mar-1976 Nancy Terry is a 45 y.o. year old female who sees Hoy Register, MD for primary care. LCSW was consulted to assess patient's needs and assist the patient with Mental Health Counseling and Resources.  Interpreter: No.   Interpreter Name & Language: none  Assessment: Patient experiencing Mental Health Concerns . Patient called the [facility]) and asked for information on [facility]). Front desk referred to CSW.  Intervention: Patient reported she is having problems with her anxiety and medications and needs a psychiatrist and therapist. She doesn't feel like herself and feels more tearful. She denied thoughts of harming herself or someone else. She knew of a newer behavioral health center in [place] but didn't know how to contact them. CSW provided patient with location and contact information for [facility]. Advised patient of their counseling walk-in hours.   Patient sees PCP at [facility]) and has an appointment in October. Advised patient that there is a Child psychotherapist also available at [facility]. Patient consented for referral to [facility] social worker.   Review of patient status, including review of consultants reports, relevant laboratory and other test results, and collaboration with appropriate care team members and the patient's provider was performed as part of comprehensive patient evaluation and provision of services.     Abigail Butts, LCSW [facility] [phone]

## 2021-02-12 ENCOUNTER — Other Ambulatory Visit: Payer: Self-pay

## 2021-02-12 ENCOUNTER — Ambulatory Visit (HOSPITAL_COMMUNITY)
Admission: EM | Admit: 2021-02-12 | Discharge: 2021-02-12 | Disposition: A | Discharge status: Home / Self Care | Payer: No Payment, Other | Attending: Psychiatry | Admitting: Psychiatry

## 2021-02-12 DIAGNOSIS — F332 Major depressive disorder, recurrent severe without psychotic features: Secondary | ICD-10-CM

## 2021-02-12 DIAGNOSIS — F419 Anxiety disorder, unspecified: Secondary | ICD-10-CM | POA: Insufficient documentation

## 2021-02-12 DIAGNOSIS — F1110 Opioid abuse, uncomplicated: Secondary | ICD-10-CM

## 2021-02-12 DIAGNOSIS — F319 Bipolar disorder, unspecified: Secondary | ICD-10-CM | POA: Insufficient documentation

## 2021-02-12 NOTE — Discharge Instructions (Addendum)

## 2021-02-12 NOTE — ED Provider Notes (Signed)
Behavioral Health Urgent Care Medical Screening Exam  Patient Name: Nancy Terry MRN: [id number] Date of Evaluation: 02/12/21 Chief Complaint:   Diagnosis:  Final diagnoses:  Severe episode of recurrent major depressive disorder, without psychotic features (HCC)  Narcotic abuse (HCC)    History of Present illness: Nancy Terry is a 45 y.o. female.  Patient presents voluntarily to [facility] behavioral health for walk-in assessment. Patient states "I am wanting to get my life together, I am wanting to come off Xanax the right way but I need something for anxiety."  She is chronic use of Xanax times approximately 10 years, reports "buys from the street or gets from friends."  Last use of Xanax on yesterday. She reports recent stressors include chronic methadone as well as opioid use.  She recently relapsed on heroin, last use 2 weeks ago.  [facility], receives methadone 120 mg, last dose earlier this date.  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  She is interested in partial hospitalization as well as intensive outpatient as she would like to attend groups, grades have been effective in the past.  She reports history of 7.5 years of sobriety prior to a relapse several years ago. She is not currently followed by outpatient psychiatry, states "I am supposed to be in counseling during the season but they cannot keep help."  She is seeking to establish with outpatient psychiatry currently.  She denies current psychotropic medications, reports she has not had medications for several years.  She reports history of bipolar disorder, anxiety, and depression.  She denies family history of mental illness. Patient is assessed face-to-face by nurse  practitioner. She is seated in assessment area, no acute distress.  She is alert and oriented, pleasant and cooperative during assessment.  She reports depressed and anxious mood with congruent affect.  She denies suicidal and homicidal ideations. She denies any history of suicide attempts, denies history of self-harm.  She contracts verbally for safety with this Clinical research associate.  She has normal speech and behavior. She denies both auditory and visual hallucinations.  Patient is able to converse coherently with goal-directed thoughts and no distractibility or preoccupation.  He denies paranoia.  Objectively there is no evidence of psychosis/mania or delusional thinking. Nancy Terry resides in [place] with her roommate.  She denies access to weapons.  She is currently not employed.  She endorses average appetite and decreased sleep.  She endorses chronic, daily marijuana use.  She denies substance use aside from marijuana, denies alcohol use. Patient offered support and encouragement.  She denies any person to contact for collateral information at this time.     Psychiatric Specialty Exam  Presentation  General Appearance:Appropriate for Environment; Casual  Eye Contact:Good  Speech:Clear and Coherent; Normal Rate  Speech Volume:Normal  Handedness:Right   Mood and Affect  Mood:Depressed  Affect:Depressed   Thought Process  Thought Processes:Coherent; Goal Directed; Linear  Descriptions of Associations:Intact  Orientation:Full (Time, Place and Person)  Thought Content:Logical; WDL    Hallucinations:None  Ideas of Reference:None  Suicidal Thoughts:No  Homicidal Thoughts:No   Sensorium  Memory:Immediate Good; Recent Good; Remote Good  Judgment:Good  Insight:Fair   Executive Functions  Concentration:Good  Attention Span:Good  Recall:Good  Fund of Knowledge:Good  Language:Good   Psychomotor Activity  Psychomotor Activity:Normal   Assets  Assets:Communication Skills;  Desire for Improvement; Financial Resources/Insurance; Housing; Intimacy; Leisure Time; Physical Health; Resilience; Social Support   Sleep  Sleep:Poor  Number of hours:  No data recorded  No data recorded  Physical Exam: Physical Exam Vitals and nursing note reviewed.  Constitutional:      Appearance: Normal appearance. She is well-developed.  HENT:     Head: Normocephalic and atraumatic.     Nose: Nose normal.  Cardiovascular:     Rate and Rhythm: Normal rate.  Pulmonary:     Effort: Pulmonary effort is normal.  Musculoskeletal:        General: Normal range of motion.     Cervical back: Normal range of motion.  Skin:    General: Skin is warm and dry.  Neurological:     Mental Status: She is alert and oriented to person, place, and time.  Psychiatric:        Attention and Perception: Attention and perception normal.        Mood and Affect: Affect normal. Mood is depressed.        Speech: Speech normal.        Behavior: Behavior normal. Behavior is cooperative.        Thought Content: Thought content normal.        Cognition and Memory: Cognition and memory normal.        Judgment: Judgment normal.   Review of Systems  Constitutional: Negative.   HENT: Negative.    Eyes: Negative.   Respiratory: Negative.    Cardiovascular: Negative.   Gastrointestinal: Negative.   Genitourinary: Negative.   Musculoskeletal: Negative.   Skin: Negative.   Neurological: Negative.   Endo/Heme/Allergies: Negative.   Psychiatric/Behavioral:  Positive for depression and substance abuse. The patient has insomnia.   Blood pressure (!) 160/67, pulse (!) 53, temperature 98 F (36.7 C), temperature source Oral, resp. rate 18, SpO2 100 %. There is no height or weight on file to calculate BMI.  Musculoskeletal: Strength & Muscle Tone: within normal limits Gait & Station: normal Patient leans: N/A   [facility] Discharge Disposition for Follow up and Recommendations: Patient reviewed with  Dr. Bronwen Betters. [facility]. Patient has been referred to IOP program at [facility] behavioral health.   Lenard Lance, FNP 02/12/2021, 9:43 AM

## 2021-02-12 NOTE — Progress Notes (Signed)
   02/12/21 0734  [facility] Triage Screening (Walk-ins at [facility] only)  How Did You Hear About [place]? Self  What Is the Reason for Your Visit/Call Today? Patient presents reporting she needs help "coming off of xanax."  She reports she dhas been using xanax and pain medications on and off for the past 10 yrs.  She has had Rx xanax and when her psychistrist, Dr. Jacqulyn Bath, noticed misuse, he stopped prescribing.  She has been getting xanax, THC and occasionally opiate pain meds on the streets.  She started methadone approximately 12 yrs ago and is currently dosing at [facility]) for 7 years and dosage of 120mg .  She is not wanting to continue methadone, however feels the clinic staff have not been encouraging of her stopping.  She is very concerned  about coming off methadone, as she's been on it for so long.  Currently, she is using THC and Xanax daily (2mg  blue footall per day). She is hoping to come off xanax, however she is unable to go into a detox program, as she is a caretaker for elderly gentleman. She prefers CDIOP or outpt programs.  How Long Has This Been Causing You Problems? > than 6 months  Have You Recently Had Any Thoughts About Hurting Yourself? No  Are You Planning to Commit Suicide/Harm Yourself At This time? No  Have you Recently Had Thoughts About Hurting Someone ? No  Are You Planning To Harm Someone At This Time? No  Are you currently experiencing any auditory, visual or other hallucinations? No  Have You Used Any Alcohol or Drugs in the Past 24 Hours? Yes  How long ago did you use Drugs or Alcohol? yesterday  What Did You Use and How Much? Xanax 2mg   Do you have any current medical co-morbidities that require immediate attention? No  Clinician description of patient physical appearance/behavior: calm, pleasant, cooperative  What Do You Feel Would Help You the Most Today? Alcohol or Drug Use Treatment  If access to [facility] was not available, would you have  sought care in the Emergency Department? No  Determination of Need Routine (7 days)  Options For Referral Chemical Dependency Intensive Outpatient Therapy (CDIOP);Medication Management;Outpatient Therapy

## 2021-02-16 ENCOUNTER — Telehealth: Payer: Self-pay

## 2021-02-16 ENCOUNTER — Telehealth: Payer: Self-pay | Admitting: Clinical

## 2021-02-16 NOTE — Telephone Encounter (Signed)
LVM with patient to return call tomorrow and follow-up on lab results for her next steps. Nancy Terry, can you run an eligibility check? I believe she is uninsured and will need assistance for Harvoni. Thanks!

## 2021-02-16 NOTE — Telephone Encounter (Signed)
Sending to [place] since she saw patient that day. Thank you!

## 2021-02-16 NOTE — Telephone Encounter (Signed)
Patient calling requesting lab results.

## 2021-02-16 NOTE — Telephone Encounter (Signed)
Patient returned call and confirmed she is uninsured. Requested as needed Zofran when she eventually starts therapy in the first week or two given her predisposition to nausea and vomiting. Informed her that Nancy Terry will start her application, and she will hopefully hear back from [place] in the next couple of weeks for her start date and follow-up appointment. She states she does not make any income and is the only one in her household. Thanks Nancy Terry!

## 2021-02-17 ENCOUNTER — Other Ambulatory Visit (HOSPITAL_COMMUNITY): Payer: Self-pay

## 2021-02-17 ENCOUNTER — Telehealth: Payer: Self-pay

## 2021-02-17 NOTE — Telephone Encounter (Signed)
[facility] Patient Advocate Encounter  Completed and sent Support Path application for 02/17/21 for this patient who is uninsured.    Patient assistance phone number for follow up is [phone].   This encounter will be updated until final determination.   Clearance Coots, CPhT Specialty Pharmacy Patient [facility] for Infectious Disease Phone: [phone] Fax:  [phone]

## 2021-02-17 NOTE — Telephone Encounter (Signed)
I will start application this am.

## 2021-02-17 NOTE — Telephone Encounter (Signed)
Thank you :)

## 2021-02-18 ENCOUNTER — Ambulatory Visit (HOSPITAL_BASED_OUTPATIENT_CLINIC_OR_DEPARTMENT_OTHER): Payer: No Typology Code available for payment source | Admitting: Obstetrics & Gynecology

## 2021-02-18 ENCOUNTER — Telehealth: Payer: Self-pay

## 2021-02-18 NOTE — Telephone Encounter (Signed)
[facility] Patient Advocate Encounter  Completed and sent Support Path application for Harvoni for this patient who is uninsured.    Patient is approved 02/17/21 through 04/14/21.  Prescription was faxed to [facility] and will be delivered on 02/23/21.   I will call the patient once medication is delivered to clinic and set up a 1 month follow up appointment.   Clearance Coots, CPhT Specialty Pharmacy Patient [facility] for Infectious Disease Phone: [phone] Fax:  [phone]

## 2021-02-20 ENCOUNTER — Telehealth: Payer: Self-pay

## 2021-02-20 NOTE — Telephone Encounter (Signed)
[facility] Patient Advocate Encounter  Patient's medications have been couriered to [facility] from [facility] and will be picked up 02/27/21.  I will make her 1 month follow up when she picks up the medication.  Clearance Coots , CPhT Specialty Pharmacy Patient [facility] for Infectious Disease Phone: [phone] Fax:  [phone]

## 2021-02-20 NOTE — Telephone Encounter (Signed)
Did some research and [facility] and [facility] are the closest [facility] I found who will accept pts. Do you know of others?

## 2021-02-23 NOTE — Telephone Encounter (Signed)
These are closest ones I know of as well.

## 2021-02-25 ENCOUNTER — Telehealth: Payer: Self-pay | Admitting: Pharmacist

## 2021-02-25 NOTE — Telephone Encounter (Signed)
Patient is approved to receive Harvoni x 8 weeks for chronic Hepatitis C infection. Counseled patient to take Harvoni daily with or without food. Encouraged patient not to miss any doses and explained how their chance of cure could go down with each dose missed.  Counseled patient on what to do if dose is missed - if it is closer to the missed dose take immediately; if closer to next dose then skip dose and take the next dose at the usual time. Counseled patient on common side effects such as headache, fatigue, and nausea and that these normally decrease with time. I reviewed patient medications and found no interactions. Discussed with patient that there are several drug interactions including acid suppressants. Instructed patient to call clinic if she wishes to start a new medication during course of therapy. Also advised patient to call if she experiences side effects. Patient will follow-up with me in the pharmacy clinic on 10/21.  Aqua stated she wanted to start her medicine on Monday to keep it consistent in her mind which I said was fine. We also worked together to set up her MyChart so she can access everything more easily now.  Margarite Gouge, PharmD, CPP Clinical Pharmacist Practitioner Infectious Diseases Clinical Pharmacist [facility] for Infectious Disease

## 2021-03-11 ENCOUNTER — Ambulatory Visit: Payer: Self-pay | Attending: Family Medicine | Admitting: Clinical

## 2021-03-11 ENCOUNTER — Other Ambulatory Visit: Payer: Self-pay

## 2021-03-11 DIAGNOSIS — F1110 Opioid abuse, uncomplicated: Secondary | ICD-10-CM

## 2021-03-11 DIAGNOSIS — F332 Major depressive disorder, recurrent severe without psychotic features: Secondary | ICD-10-CM

## 2021-03-11 DIAGNOSIS — F1310 Sedative, hypnotic or anxiolytic abuse, uncomplicated: Secondary | ICD-10-CM

## 2021-03-18 ENCOUNTER — Telehealth: Payer: Self-pay

## 2021-03-18 ENCOUNTER — Other Ambulatory Visit: Payer: Self-pay

## 2021-03-18 ENCOUNTER — Ambulatory Visit: Payer: No Typology Code available for payment source | Attending: Family Medicine

## 2021-03-18 NOTE — Telephone Encounter (Signed)
[facility] Patient Advocate Encounter  Patient's medications have been couriered to [facility] from [facility] and will be picked up 03/27/21.  Clearance Coots , CPhT Specialty Pharmacy Patient [facility] for Infectious Disease Phone: [phone] Fax:  [phone]

## 2021-03-20 NOTE — BH Specialist Note (Signed)
Integrated Behavioral Health Initial In-Person Visit  MRN: [id number] Name: Nancy Terry  Number of Integrated Behavioral Health Clinician visits:: 1/6 Session Start time: 11:00am  Session End time: 12:00pm Total time: 60 minutes  Types of Service: Individual psychotherapy  Interpretor:No. Interpretor Name and Language: N/A   Warm Hand Off Completed.        Subjective: Nancy Terry is a 45 y.o. female accompanied by  self Patient was referred by PCP Alvis Lemmings, MD  for substance use, depression, and anxiety. Patient reports the following symptoms/concerns: Reports feeling depressed, decreased energy, trouble concentrating, trouble sleeping, anxiousness, excessive worrying, trouble relaxing, restlessness, and irritability. Reports a hx of heroin use for several years. Reports that she stopped heroin use for 7-8 years and recently relapsed about one month ago. Reports that she is currently not using heroin and is receiving methadone. Reports feelings of guilt related to relapse. Reports that she also wants to be tapered off of methadone. Reports that she also uses xanax that is not prescribed from a medical provider about 3 times/day and has a hx of taking xanax 5-6 times/day. Reports xanax use for several years not prescribed from a medical provider. Reports that she wants to be able to stop taking xanax and wants substance use outpatient tx. Reports that her partner, that she currently lives with, continues to use heroin daily. Reports that she is unhappy in her relationship and wants to leave.  Duration of problem: 20 years; Severity of problem: moderate  Objective: Mood: Anxious and Depressed and Affect: Appropriate Risk of harm to self or others: No plan to harm self or others  Life Context: Family and Social: Reports that she currently stays with her partner. Reports having adult children. School/Work: Reports that she is unemployed and receives financial support from her partner.   Self-Care: Pt has hx of heroin use and currently uses xanax that is not prescribed from a medical provider. Reports going for walks with her dog as coping skill. Life Changes: Reports recently relapsing on heroin about one month ago. Reports that she continues to use xanax and frequently craves it. Reports trying to resist heroin use due to her partner using in the home. Reports going for walks when her partner uses heroin.  Patient and/or Family's Strengths/Protective Factors: Sense of purpose  Goals Addressed: Patient will: Reduce symptoms of: anxiety and depression Increase knowledge and/or ability of: coping skills and healthy habits  Demonstrate ability to: Increase adequate support systems for patient/family and Decrease self-medicating behaviors  Progress towards Goals: Ongoing  Interventions: Interventions utilized: Mindfulness or Management consultant, CBT Cognitive Behavioral Therapy, Supportive Counseling, Psychoeducation and/or Health Education, and Stages of Change   Standardized Assessments completed: GAD-7 and PHQ 9  Patient and/or Family Response: Pt receptive to tx. Pt receptive to psychoeducation provided on substance use, depression, and anxiety. Pt receptive to affirmations provided in order to decrease pt's feelings of guilt. Pt receptive to cognitive restructuring. Pt receptive to incorporating deep breathing exercises, continuing walking, and beginning outpatient tx. Pt was not receptive to substance use inpatient tx.   Patient Centered Plan: Patient is on the following Treatment Plan(s):  Substance use, Depression, Anxiety  Assessment: Denies SI/HI. Denies auditory/visual hallucinations. Patient currently experiencing substance use, depression, and anxiety. Pt has had difficulty stopping xanax use and craves it frequently. Pt has continued to use xanax and has had difficulty controlling her use. Pt has hx of heroin use and has feelings of guilt related to relapse one  month ago after 7-8  years of not using. Pt has experienced cravings with heroin use and withdrawal symptoms that contribute to her use of xanax. Pt receives methadone and wants to be able to stop receiving methadone. Pt appears to have relationship problems related to her partner continuing to use heroin despite her hx of heroin use and his physical health problems. Pt appears to experience depression and significant self-esteem disturbances.   Patient may benefit from substance use inpatient tx however pt is requesting substance use outpatient tx. LCSWA provided psychoeducation on substance use and risks associated with it. LCSWA also provided psychoeducation on depression and anxiety. LCSWA attempted to normalize relapses by explaining the stages of change. LCSWA provided affirmation for pt's progress and utilized cognitive restructuring. LCSWA encouraged pt to continue walking as a healthy coping skill and utilize deep breathing exercises. LCSWA will refer pt to substance abuse intensive outpatient. LCSWA will fu with pt.   Plan: Follow up with behavioral health clinician on : 03/30/21 Behavioral recommendations: Utilize deep breathing exercises and continue walking Referral(s): Integrated Behavioral Health Services (In Clinic) and Substance Abuse Program "From scale of 1-10, how likely are you to follow plan?": 10  Nancy Terry C Nancy Ratterree, LCSW

## 2021-03-23 ENCOUNTER — Telehealth: Payer: Self-pay

## 2021-03-23 NOTE — Telephone Encounter (Signed)
Referral faxed to [facility].

## 2021-03-27 ENCOUNTER — Telehealth: Payer: Self-pay | Admitting: Pharmacist

## 2021-03-27 ENCOUNTER — Ambulatory Visit: Payer: Self-pay | Admitting: Pharmacist

## 2021-03-27 NOTE — Telephone Encounter (Signed)
Patient called this morning stating she had not started her Harvoni because she had multiple appointments this month and had concerns about starting the medicine. She said she got a tooth pulled and received antibiotics for it. The dental nurse told her to not take the antibiotic with Harvoni. I am unaware of any significant drug interactions between normal prophylactic dental antibiotics and Harvoni. Told patient that she can call if she ever has questions about drug interactions and that she can take Harvoni with her antibiotics.  She states she is a very anxious person who procrastinates but that she really wants to start her medicine. She says she will start on Monday because she is getting her flu shot and COVID booster. Told her it was ok to get these shots and take the medicine together. We cancelled her appointment today, and she will follow up with me on 11/23. I will call her next week to ensure she started her regimen.  Margarite Gouge, PharmD, CPP Clinical Pharmacist Practitioner Infectious Diseases Clinical Pharmacist [facility] for Infectious Disease

## 2021-03-30 ENCOUNTER — Other Ambulatory Visit (HOSPITAL_COMMUNITY)
Admission: RE | Admit: 2021-03-30 | Discharge: 2021-03-30 | Disposition: A | Discharge status: Home / Self Care | Payer: Medicaid Other | Source: Ambulatory Visit | Attending: Family Medicine | Admitting: Family Medicine

## 2021-03-30 ENCOUNTER — Ambulatory Visit: Payer: Medicaid Other | Attending: Family Medicine | Admitting: Family Medicine

## 2021-03-30 ENCOUNTER — Encounter: Payer: Self-pay | Admitting: Family Medicine

## 2021-03-30 ENCOUNTER — Other Ambulatory Visit: Payer: Self-pay

## 2021-03-30 VITALS — BP 135/81 | HR 61 | Ht 64.0 in | Wt 203.0 lb

## 2021-03-30 DIAGNOSIS — Z124 Encounter for screening for malignant neoplasm of cervix: Secondary | ICD-10-CM | POA: Insufficient documentation

## 2021-03-30 DIAGNOSIS — Z13228 Encounter for screening for other metabolic disorders: Secondary | ICD-10-CM

## 2021-03-30 DIAGNOSIS — Z1211 Encounter for screening for malignant neoplasm of colon: Secondary | ICD-10-CM

## 2021-03-30 DIAGNOSIS — Z0000 Encounter for general adult medical examination without abnormal findings: Secondary | ICD-10-CM

## 2021-03-30 DIAGNOSIS — Z1231 Encounter for screening mammogram for malignant neoplasm of breast: Secondary | ICD-10-CM

## 2021-03-30 LAB — POCT URINALYSIS DIP (CLINITEK)
Bilirubin, UA: NEGATIVE
Glucose, UA: NEGATIVE mg/dL
Ketones, POC UA: NEGATIVE mg/dL
Leukocytes, UA: NEGATIVE
Nitrite, UA: NEGATIVE
POC PROTEIN,UA: NEGATIVE
Spec Grav, UA: >=1.03 — AB (ref 1.010–1.025)
Urobilinogen, UA: 0.2 E.U./dL
pH, UA: 6 (ref 5.0–8.0)

## 2021-03-30 NOTE — Progress Notes (Signed)
Subjective:  Patient ID: Nancy Terry, female    DOB: 06/15/1975  Age: 45 y.o. MRN: [id number]  CC: Annual Exam and Gynecologic Exam   HPI Nancy Terry is a 45 y.o. year old female with a history of bipolar disorder, anxiety and depression, previous history of opioid abuse (currently on methadone), migraines, insomnia, GERD, sacroiliac joint dysfunction.  Interval History: She presents today for complete physical exam and is due for breast cancer screening, cervical cancer screening and colon cancer screening. I had referred her for colonoscopy previously but she states that gastroenterologist canceled on her due to the fact that he was involved in an accident. She thinks she might have a UTI and would like her urine checked. Past Medical History:  Diagnosis Date   Anxiety    Depression    Hepatitis C    Herpes infection    Hypertension    Kidney stones    Migraines    Narcotic abuse ([place])     Past Surgical History:  Procedure Laterality Date   BREAST BIOPSY     ELBOW SURGERY Left    KIDNEY STONE SURGERY     with stent placement   LITHOTRIPSY     TUBAL LIGATION      Family History  Problem Relation Age of Onset   Parkinson's disease Mother    Hypertension Mother    Rheum arthritis Mother    Heart failure Mother    Parkinson's disease Maternal Grandmother    Hypertension Maternal Grandmother    Glaucoma Maternal Grandmother    Lung cancer Maternal Grandmother    Colon cancer Neg Hx    Stomach cancer Neg Hx    Pancreatic cancer Neg Hx    Esophageal cancer Neg Hx     Allergies  Allergen Reactions   Haldol [Haloperidol Decanoate] Other (See Comments)    Hallucinations   Nitrates, Organic Other (See Comments)    headaches    Outpatient Medications Prior to Visit  Medication Sig Dispense Refill   acetaminophen-codeine (TYLENOL #3) 300-30 MG tablet Take 1-2 tablets by mouth every 6 (six) hours as needed for moderate pain. 15 tablet 0   clindamycin (CLEOCIN T) 1  % lotion Apply topically 2 (two) times daily. 60 mL 6   furosemide (LASIX) 20 MG tablet Take 1 tablet (20 mg total) by mouth daily. 15 tablet 0   hydrOXYzine (ATARAX/VISTARIL) 25 MG tablet Take 1 tablet (25 mg total) by mouth every 8 (eight) hours as needed for anxiety. 21 tablet 0   ibuprofen (ADVIL) 200 MG tablet Take 200 mg by mouth every 6 (six) hours as needed.      methadone (DOLOPHINE) 10 MG/5ML solution Take 120 mg by mouth daily.     silver sulfADIAZINE (SILVADENE) 1 % cream Apply 1 application topically daily. 50 g 0   No facility-administered medications prior to visit.     ROS Review of Systems  Constitutional:  Negative for activity change, appetite change and fatigue.  HENT:  Negative for congestion, sinus pressure and sore throat.   Eyes:  Negative for visual disturbance.  Respiratory:  Negative for cough, chest tightness, shortness of breath and wheezing.   Cardiovascular:  Negative for chest pain and palpitations.  Gastrointestinal:  Negative for abdominal distention, abdominal pain and constipation.  Endocrine: Negative for polydipsia.  Genitourinary:  Negative for dysuria and frequency.  Musculoskeletal:  Negative for arthralgias and back pain.  Skin:  Negative for rash.  Neurological:  Negative for tremors, light-headedness and numbness.  Hematological:  Does not bruise/bleed easily.  Psychiatric/Behavioral:  Negative for agitation and behavioral problems.    Objective:  BP 135/81   Pulse 61   Ht '5\' 4"'  (1.626 m)   Wt 203 lb (92.1 kg)   LMP 02/05/2021   SpO2 99%   BMI 34.84 kg/m   BP/Weight 03/30/2021 10/10/2020 37/48/2707  Systolic BP 867 544 920  Diastolic BP 81 72 62  Wt. (Lbs) 203 - -  BMI 34.84 - -  Some encounter information is confidential and restricted. Go to Review Flowsheets activity to see all data.      Physical Exam Exam conducted with a chaperone present.  Constitutional:      General: She is not in acute distress.    Appearance: She  is well-developed. She is not diaphoretic.  HENT:     Head: Normocephalic.     Right Ear: External ear normal.     Left Ear: External ear normal.     Nose: Nose normal.  Eyes:     Conjunctiva/sclera: Conjunctivae normal.     Pupils: Pupils are equal, round, and reactive to light.  Neck:     Vascular: No JVD.  Cardiovascular:     Rate and Rhythm: Normal rate and regular rhythm.     Heart sounds: Normal heart sounds. No murmur heard.   No gallop.  Pulmonary:     Effort: Pulmonary effort is normal. No respiratory distress.     Breath sounds: Normal breath sounds. No wheezing or rales.  Chest:     Chest wall: No tenderness.  Breasts:    Right: Normal. No mass, nipple discharge or tenderness.     Left: Normal. No mass, nipple discharge or tenderness.  Abdominal:     General: Bowel sounds are normal. There is no distension.     Palpations: Abdomen is soft. There is no mass.     Tenderness: There is no abdominal tenderness.     Hernia: There is no hernia in the left inguinal area or right inguinal area.  Genitourinary:    General: Normal vulva.     Pubic Area: No rash.      Labia:        Right: No rash.        Left: No rash.      Vagina: Normal.     Cervix: Normal.     Uterus: Normal.      Adnexa: Right adnexa normal and left adnexa normal.       Right: No tenderness.         Left: No tenderness.    Musculoskeletal:        General: No tenderness. Normal range of motion.     Cervical back: Normal range of motion. No tenderness.  Lymphadenopathy:     Upper Body:     Right upper body: No supraclavicular or axillary adenopathy.     Left upper body: No supraclavicular or axillary adenopathy.  Skin:    General: Skin is warm and dry.  Neurological:     Mental Status: She is alert and oriented to person, place, and time.     Deep Tendon Reflexes: Reflexes are normal and symmetric.    CMP Latest Ref Rng & Units 01/28/2021 01/28/2021 03/27/2020  Glucose 65 - 99 mg/dL 96 - 92  BUN  7 - 25 mg/dL 7 - 5(L)  Creatinine 0.50 - 0.99 mg/dL 0.70 - 0.74  Sodium 135 - 146 mmol/L 138 - 140  Potassium 3.5 - 5.3  mmol/L 3.9 - 3.4(L)  Chloride 98 - 110 mmol/L 104 - 108  CO2 20 - 32 mmol/L 29 - 23  Calcium 8.6 - 10.2 mg/dL 9.3 - 8.7(L)  Total Protein 6.1 - 8.1 g/dL 7.3 - -  Total Bilirubin 0.2 - 1.2 mg/dL 0.6 - -  Alkaline Phos 38 - 126 U/L - - -  AST 10 - 35 U/L 14 - -  ALT 6 - 29 U/L 11 13 -    Lipid Panel     Component Value Date/Time   CHOL 102 03/22/2017 0947   TRIG 130 03/22/2017 0947   HDL 24 (L) 03/22/2017 0947   CHOLHDL 4.3 03/22/2017 0947   CHOLHDL 4.9 06/14/2014 0851   VLDL 31 06/14/2014 0851   LDLCALC 52 03/22/2017 0947    CBC    Component Value Date/Time   WBC 7.2 01/28/2021 1117   RBC 4.75 01/28/2021 1117   HGB 9.7 (L) 01/28/2021 1117   HGB 11.8 08/29/2019 1112   HCT 32.8 (L) 01/28/2021 1117   HCT 37.2 08/29/2019 1112   PLT 411 (H) 01/28/2021 1117   PLT 351 08/29/2019 1112   MCV 69.1 (L) 01/28/2021 1117   MCV 78 (L) 08/29/2019 1112   MCH 20.4 (L) 01/28/2021 1117   MCHC 29.6 (L) 01/28/2021 1117   RDW 15.8 (H) 01/28/2021 1117   RDW 15.6 (H) 08/29/2019 1112   LYMPHSABS 2.8 01/25/2020 1253   LYMPHSABS 2.3 08/29/2019 1112   MONOABS 0.9 01/25/2020 1253   EOSABS 0.4 01/25/2020 1253   EOSABS 0.3 08/29/2019 1112   BASOSABS 0.1 01/25/2020 1253   BASOSABS 0.1 08/29/2019 1112    Lab Results  Component Value Date   HGBA1C 5.3 06/14/2014    Assessment & Plan:  1. Annual physical exam Counseled on 150 minutes of exercise per week, healthy eating (including decreased daily intake of saturated fats, cholesterol, added sugars, sodium), routine healthcare maintenance.  - POCT URINALYSIS DIP (CLINITEK)  2. Screening for cervical cancer - Cytology - PAP UA is negative for UTI  3. Encounter for screening mammogram for malignant neoplasm of breast - MM 3D SCREEN BREAST BILATERAL; Future  4. Screening for colon cancer - Fecal occult blood,  imunochemical([facility])  5. Screening for metabolic disorder - Hemoglobin A1c; Future - LP+Non-HDL Cholesterol; Future - CMP14+EGFR; Future - T4, free; Future - TSH; Future - VITAMIN D 25 Hydroxy (Vit-D Deficiency, Fractures); Future    No orders of the defined types were placed in this encounter.   Follow-up: Return in about 6 months (around 09/28/2021) for Chronic disease management.       Charlott Rakes, MD, FAAFP. [facility], [place]   03/30/2021, 5:47 PM

## 2021-03-30 NOTE — Patient Instructions (Signed)
Health Maintenance, Female Adopting a healthy lifestyle and getting preventive care are important in promoting health and wellness. Ask your health care provider about: The right schedule for you to have regular tests and exams. Things you can do on your own to prevent diseases and keep yourself healthy. What should I know about diet, weight, and exercise? Eat a healthy diet  Eat a diet that includes plenty of vegetables, fruits, low-fat dairy products, and lean protein. Do not eat a lot of foods that are high in solid fats, added sugars, or sodium. Maintain a healthy weight Body mass index (BMI) is used to identify weight problems. It estimates body fat based on height and weight. Your health care provider can help determine your BMI and help you achieve or maintain a healthy weight. Get regular exercise Get regular exercise. This is one of the most important things you can do for your health. Most adults should: Exercise for at least 150 minutes each week. The exercise should increase your heart rate and make you sweat (moderate-intensity exercise). Do strengthening exercises at least twice a week. This is in addition to the moderate-intensity exercise. Spend less time sitting. Even light physical activity can be beneficial. Watch cholesterol and blood lipids Have your blood tested for lipids and cholesterol at 47 years of age, then have this test every 5 years. Have your cholesterol levels checked more often if: Your lipid or cholesterol levels are high. You are older than 47 years of age. You are at high risk for heart disease. What should I know about cancer screening? Depending on your health history and family history, you may need to have cancer screening at various ages. This may include screening for: Breast cancer. Cervical cancer. Colorectal cancer. Skin cancer. Lung cancer. What should I know about heart disease, diabetes, and high blood pressure? Blood pressure and heart  disease High blood pressure causes heart disease and increases the risk of stroke. This is more likely to develop in people who have high blood pressure readings, are of African descent, or are overweight. Have your blood pressure checked: Every 3-5 years if you are 18-39 years of age. Every year if you are 40 years old or older. Diabetes Have regular diabetes screenings. This checks your fasting blood sugar level. Have the screening done: Once every three years after age 40 if you are at a normal weight and have a low risk for diabetes. More often and at a younger age if you are overweight or have a high risk for diabetes. What should I know about preventing infection? Hepatitis B If you have a higher risk for hepatitis B, you should be screened for this virus. Talk with your health care provider to find out if you are at risk for hepatitis B infection. Hepatitis C Testing is recommended for: Everyone born from 1945 through 1965. Anyone with known risk factors for hepatitis C. Sexually transmitted infections (STIs) Get screened for STIs, including gonorrhea and chlamydia, if: You are sexually active and are younger than 47 years of age. You are older than 47 years of age and your health care provider tells you that you are at risk for this type of infection. Your sexual activity has changed since you were last screened, and you are at increased risk for chlamydia or gonorrhea. Ask your health care provider if you are at risk. Ask your health care provider about whether you are at high risk for HIV. Your health care provider may recommend a prescription medicine   to help prevent HIV infection. If you choose to take medicine to prevent HIV, you should first get tested for HIV. You should then be tested every 3 months for as long as you are taking the medicine. Pregnancy If you are about to stop having your period (premenopausal) and you may become pregnant, seek counseling before you get  pregnant. Take 400 to 800 micrograms (mcg) of folic acid every day if you become pregnant. Ask for birth control (contraception) if you want to prevent pregnancy. Osteoporosis and menopause Osteoporosis is a disease in which the bones lose minerals and strength with aging. This can result in bone fractures. If you are 65 years old or older, or if you are at risk for osteoporosis and fractures, ask your health care provider if you should: Be screened for bone loss. Take a calcium or vitamin D supplement to lower your risk of fractures. Be given hormone replacement therapy (HRT) to treat symptoms of menopause. Follow these instructions at home: Lifestyle Do not use any products that contain nicotine or tobacco, such as cigarettes, e-cigarettes, and chewing tobacco. If you need help quitting, ask your health care provider. Do not use street drugs. Do not share needles. Ask your health care provider for help if you need support or information about quitting drugs. Alcohol use Do not drink alcohol if: Your health care provider tells you not to drink. You are pregnant, may be pregnant, or are planning to become pregnant. If you drink alcohol: Limit how much you use to 0-1 drink a day. Limit intake if you are breastfeeding. Be aware of how much alcohol is in your drink. In the [place] one drink equals one 12 oz bottle of beer (355 mL), one 5 oz glass of wine (148 mL), or one 1 oz glass of hard liquor (44 mL). General instructions Schedule regular health, dental, and eye exams. Stay current with your vaccines. Tell your health care provider if: You often feel depressed. You have ever been abused or do not feel safe at home. Summary Adopting a healthy lifestyle and getting preventive care are important in promoting health and wellness. Follow your health care provider's instructions about healthy diet, exercising, and getting tested or screened for diseases. Follow your health care provider's  instructions on monitoring your cholesterol and blood pressure. This information is not intended to replace advice given to you by your health care provider. Make sure you discuss any questions you have with your health care provider. Document Revised: 08/01/2020 Document Reviewed: 05/17/2018 Elsevier Patient Education  2022 Elsevier Inc.  

## 2021-03-31 ENCOUNTER — Telehealth: Payer: Self-pay | Admitting: Pharmacist

## 2021-03-31 ENCOUNTER — Other Ambulatory Visit: Payer: Self-pay | Admitting: Pharmacist

## 2021-03-31 ENCOUNTER — Telehealth: Payer: Self-pay | Admitting: Family Medicine

## 2021-03-31 NOTE — Telephone Encounter (Signed)
Spoke with patient to ensure she started her Harvoni yesterday. She states she started taking it yesterday at [facility] and will continue taking it every day at 5PM around dinner time. Patient has experienced difficulty in starting the medicine mostly from fear of experiencing side effects and dealing with family challenges. Congratulated patient and encouraged her to continue taking the medicine every day.  Margarite Gouge, PharmD, CPP Clinical Pharmacist Practitioner Infectious Diseases Clinical Pharmacist [facility]

## 2021-03-31 NOTE — Telephone Encounter (Signed)
Asante out of the office 10/26. Left vm to call [phone] to  reschedule appt.  

## 2021-03-31 NOTE — Progress Notes (Signed)
Entered in error

## 2021-04-01 ENCOUNTER — Other Ambulatory Visit: Payer: No Typology Code available for payment source

## 2021-04-01 ENCOUNTER — Ambulatory Visit: Payer: Medicaid Other | Attending: Family Medicine

## 2021-04-01 ENCOUNTER — Other Ambulatory Visit: Payer: Self-pay | Admitting: Family Medicine

## 2021-04-01 ENCOUNTER — Other Ambulatory Visit: Payer: Self-pay

## 2021-04-01 ENCOUNTER — Ambulatory Visit: Payer: No Typology Code available for payment source | Admitting: Clinical

## 2021-04-01 DIAGNOSIS — Z13228 Encounter for screening for other metabolic disorders: Secondary | ICD-10-CM

## 2021-04-01 LAB — CYTOLOGY - PAP
Comment: NEGATIVE
Diagnosis: NEGATIVE
High risk HPV: NEGATIVE

## 2021-04-01 MED ORDER — FLUCONAZOLE 150 MG PO TABS
150.0000 mg | ORAL_TABLET | Freq: Once | ORAL | 0 refills | Status: AC
  Filled 2021-04-01: qty 1, 1d supply, fill #0

## 2021-04-02 ENCOUNTER — Other Ambulatory Visit: Payer: Self-pay

## 2021-04-02 LAB — LP+NON-HDL CHOLESTEROL
Cholesterol, Total: 87 mg/dL — ABNORMAL LOW (ref 100–199)
HDL: 23 mg/dL — ABNORMAL LOW (ref >39)
LDL Chol Calc (NIH): 41 mg/dL (ref 0–99)
Total Non-HDL-Chol (LDL+VLDL): 64 mg/dL (ref 0–129)
Triglycerides: 125 mg/dL (ref 0–149)
VLDL Cholesterol Cal: 23 mg/dL (ref 5–40)

## 2021-04-02 LAB — CMP14+EGFR
ALT: 15 IU/L (ref 0–32)
AST: 14 IU/L (ref 0–40)
Albumin/Globulin Ratio: 1.7 (ref 1.2–2.2)
Albumin: 4.3 g/dL (ref 3.8–4.8)
Alkaline Phosphatase: 76 IU/L (ref 44–121)
BUN/Creatinine Ratio: 9 (ref 9–23)
BUN: 7 mg/dL (ref 6–24)
Bilirubin Total: 0.3 mg/dL (ref 0.0–1.2)
CO2: 22 mmol/L (ref 20–29)
Calcium: 9.3 mg/dL (ref 8.7–10.2)
Chloride: 104 mmol/L (ref 96–106)
Creatinine, Ser: 0.76 mg/dL (ref 0.57–1.00)
Globulin, Total: 2.6 g/dL (ref 1.5–4.5)
Glucose: 83 mg/dL (ref 70–99)
Potassium: 4.3 mmol/L (ref 3.5–5.2)
Sodium: 139 mmol/L (ref 134–144)
Total Protein: 6.9 g/dL (ref 6.0–8.5)
eGFR: 98 mL/min/{1.73_m2} (ref >59)

## 2021-04-02 LAB — VITAMIN D 25 HYDROXY (VIT D DEFICIENCY, FRACTURES): Vit D, 25-Hydroxy: 31.2 ng/mL (ref 30.0–100.0)

## 2021-04-02 LAB — TSH: TSH: 1.38 u[IU]/mL (ref 0.450–4.500)

## 2021-04-02 LAB — HEMOGLOBIN A1C
Est. average glucose Bld gHb Est-mCnc: 117 mg/dL
Hgb A1c MFr Bld: 5.7 % — ABNORMAL HIGH (ref 4.8–5.6)

## 2021-04-02 LAB — T4, FREE: Free T4: 0.98 ng/dL (ref 0.82–1.77)

## 2021-04-03 ENCOUNTER — Telehealth: Payer: Self-pay | Admitting: Family Medicine

## 2021-04-03 NOTE — Telephone Encounter (Signed)
Pt is calling back to receive her labs CB- [phone]

## 2021-04-04 NOTE — Telephone Encounter (Signed)
Called pt made aware of MD results notes and instructions. Verbalized understaning

## 2021-04-09 ENCOUNTER — Other Ambulatory Visit: Payer: Self-pay

## 2021-04-15 ENCOUNTER — Other Ambulatory Visit: Payer: Self-pay

## 2021-04-15 ENCOUNTER — Ambulatory Visit: Payer: Self-pay | Attending: Family Medicine | Admitting: Clinical

## 2021-04-15 DIAGNOSIS — F1110 Opioid abuse, uncomplicated: Secondary | ICD-10-CM

## 2021-04-15 DIAGNOSIS — F331 Major depressive disorder, recurrent, moderate: Secondary | ICD-10-CM

## 2021-04-15 DIAGNOSIS — F1310 Sedative, hypnotic or anxiolytic abuse, uncomplicated: Secondary | ICD-10-CM

## 2021-04-17 ENCOUNTER — Telehealth: Payer: Self-pay | Admitting: Pharmacist

## 2021-04-17 ENCOUNTER — Other Ambulatory Visit: Payer: Self-pay

## 2021-04-17 ENCOUNTER — Other Ambulatory Visit: Payer: Self-pay | Admitting: Pharmacist

## 2021-04-17 DIAGNOSIS — R110 Nausea: Secondary | ICD-10-CM

## 2021-04-17 MED ORDER — ONDANSETRON HCL 4 MG PO TABS
4.0000 mg | ORAL_TABLET | Freq: Three times a day (TID) | ORAL | 2 refills | Status: DC | PRN
  Filled 2021-04-17 – 2021-05-26 (×2): qty 20, 7d supply, fill #0

## 2021-04-17 NOTE — Telephone Encounter (Signed)
Rickia called today stating she has been experiencing multiple side effects from [facility]. She states she has been taking it every day but missed her dose yesterday because her daughter was in labor and she was taking care of her children. She states she cannot sleep and has worsening headaches as well as nausea and leg/ankle swelling.  Discussed that the headaches and nausea could certainly be from the [facility]. She states she has not been taking any medicine to mitigate these symptoms and nothing makes them better/worse. Stated to avoid acetaminophen given her chronic hepatitis. Patient has tolerated ibuprofen in the past, and her kidney is normal. Stated to try ibuprofen or naproxen; patient verbalized understanding to only try one at a time and not take both simultaneously.   Will also send in PRN Zofran to take as needed for nausea. Understand possible drug interaction with methadone but should be minimal given PRN prescription only through completion of Harvoni next month.  Margarite Gouge, PharmD, CPP Clinical Pharmacist Practitioner Infectious Diseases Clinical Pharmacist [facility]

## 2021-04-20 NOTE — BH Specialist Note (Signed)
Integrated Behavioral Health Follow Up In-Person Visit  MRN: [id number] Name: Nancy Terry  Number of Integrated Behavioral Health Clinician visits: 2/6 Session Start time: 8:40am  Session End time: 9:30am Total time: 50  minutes  Types of Service: Individual psychotherapy  Interpretor:No. Interpretor Name and Language: N/A  Subjective: Nancy Terry is a 45 y.o. female accompanied by  self Patient was referred by PCP Nancy Register, MD for substance use, depression, and anxiety. Patient reports the following symptoms/concerns: Reports feeling depressed, decreased interest in activities, decreased energy, trouble sleeping, self-esteem disturbances, trouble concentrating, fidgeting, anxiousness, excessive worrying, trouble relaxing, irritability, and restlessness. Reports that she continues to not use heroin. Reports that she has decreased xanax to two times/day. Reports that she continues have problems with her partner continues to use heroin.   Duration of problem: 20 years; Severity of problem: moderate  Objective: Mood: Anxious and Depressed and Affect: Appropriate Risk of harm to self or others: No plan to harm self or others  Life Context: Family and Social: Reports that she currently stays with her partner. Reports having adult children. School/Work: Reports that she is unemployed and receives financial support from her partner.  Self-Care: Pt has hx of heroin use and currently uses xanax that is not prescribed from a medical provider. Reports that she has decreased xanax use to two pills/day. Reports that she  Reports going for walks with her dog as coping skill. Life Changes: Reports recently relapsing on heroin about one month ago. Reports that she continues to use xanax and frequently craves it. Reports trying to resist heroin use due to her partner using in the home. Reports going for walks when her partner uses heroin.  Patient and/or Family's Strengths/Protective Factors: Sense  of purpose  Goals Addressed: Patient will:  Reduce symptoms of: anxiety and depression   Increase knowledge and/or ability of: coping skills and healthy habits   Demonstrate ability to: Increase healthy adjustment to current life circumstances and Decrease self-medicating behaviors  Progress towards Goals: Ongoing  Interventions: Interventions utilized:  Mindfulness or Management consultant, CBT Cognitive Behavioral Therapy, Supportive Counseling, and Psychoeducation and/or Health Education Standardized Assessments completed: GAD-7 and PHQ 9 GAD 7 : Generalized Anxiety Score 04/15/2021 03/11/2021 04/02/2020 02/29/2020  Nervous, Anxious, on Edge 3 3 1  0  Control/stop worrying 3 3 1  0  Worry too much - different things 3 3 1  0  Trouble relaxing 3 3 1 1   Restless 1 2 0 0  Easily annoyed or irritable 1 2 1 1   Afraid - awful might happen 0 0 0 0  Total GAD 7 Score 14 16 5 2   Anxiety Difficulty - - Somewhat difficult -     Depression screen [facility] 2/9 04/15/2021 03/11/2021 04/02/2020 02/29/2020 02/12/2020  Decreased Interest 2 2 2 2  0  Down, Depressed, Hopeless 1 3 1  0 0  PHQ - 2 Score 3 5 3 2  0  Altered sleeping 1 3 1 1  0  Tired, decreased energy 1 3 1 1  0  Change in appetite 1 3 1 1  0  Feeling bad or failure about yourself  2 2 0 0 0  Trouble concentrating 3 2 1  0 0  Moving slowly or fidgety/restless 2 2 0 0 0  Suicidal thoughts 0 0 0 0 0  PHQ-9 Score 13 20 7 5  0  Difficult doing work/chores - - Very difficult - -  Some recent data might be hidden    Patient and/or Family Response: Pt receptive to tx. Pt receptive  to psychoeducation provided on substance use, depression, and anxiety. Pt receptive to cognitive restructuring and the normalization of substance as a coping skill. Pt receptive to empowerment. Pt will continue walking and deep breathing.   Patient Centered Plan: Patient is on the following Treatment Plan(s): Substance use, Depression and Anxiety  Assessment: Denies SI/HI.  Denies auditory/visual hallucinations. Patient currently experiencing depression, anxiety, and substance use problems. Pt has decreased xanax use to twice/day.  Pt continues to experience self-esteem disturbances related to substance use. Pt has a hx of family relationship problems which has contributed to pt's use of substances as a coping skill.   Patient may benefit from substance use tx. Pt is still not receptive to inpatient tx. Pt spoke with [facility] as this is where she was referred and she was informed that they will not be able to assist her. Pt will be referred again for SAIOP. LCSWA provided psychoeducation on substance use, anxiety, and depression. LCSWA provided affirmation for pt's progress and attempted to normalize substance use as a coping skill to decrease feelings of guilt. LCSWA utilized Chartered certified accountant. LCSWA encouraged pt to utilize deep breathing exercises and continue walking.  Plan: Follow up with behavioral health clinician on : 05/06/21 Behavioral recommendations: Utilize deep breathing exercises and continue walking. Continue harm reduction by reducing xanax use.  Referral(s): Integrated Behavioral Health Services (In Clinic) and Substance Abuse Program "From scale of 1-10, how likely are you to follow plan?": 10  Ishmeal Rorie C Martie Muhlbauer, LCSW

## 2021-04-21 ENCOUNTER — Other Ambulatory Visit: Payer: Self-pay

## 2021-04-24 ENCOUNTER — Other Ambulatory Visit: Payer: Self-pay

## 2021-04-29 ENCOUNTER — Other Ambulatory Visit: Payer: Self-pay

## 2021-04-29 ENCOUNTER — Ambulatory Visit (INDEPENDENT_AMBULATORY_CARE_PROVIDER_SITE_OTHER): Payer: Self-pay | Admitting: Pharmacist

## 2021-04-29 DIAGNOSIS — B182 Chronic viral hepatitis C: Secondary | ICD-10-CM

## 2021-04-29 NOTE — Progress Notes (Signed)
04/29/2021  HPI: Raphael Espe is a 45 y.o. female who presents to the [facility] pharmacy clinic for Hepatitis C follow-up.  Medication: Harvoni x 12 weeks  Start Date: 03/27/21  Hepatitis C Genotype: 1a  Fibrosis Score: F0  Hepatitis C RNA: 3,000 on 01/28/21  Patient Active Problem List   Diagnosis Date Noted   Hidradenitis 10/10/2019   Tobacco use 10/10/2019   Chronic pain syndrome 04/11/2018   Complex ovarian cyst 11/21/2017   Hypertension 11/21/2017   Menorrhagia 05/13/2017   Sciatica 05/13/2017   Migraines 05/07/2016   Hepatitis C 05/07/2016   Depression 04/05/2016   Anxiety and depression 04/05/2016   Narcotic abuse (HCC) 04/05/2016   Breast lump on right side at 10 o'clock position 03/12/2014   Pap smear of cervix shows high risk HPV present 03/12/2014    Patient's Medications  New Prescriptions   No medications on file  Previous Medications   ACETAMINOPHEN-CODEINE (TYLENOL #3) 300-30 MG TABLET    Take 1-2 tablets by mouth every 6 (six) hours as needed for moderate pain.   CLINDAMYCIN (CLEOCIN T) 1 % LOTION    Apply topically 2 (two) times daily.   FUROSEMIDE (LASIX) 20 MG TABLET    Take 1 tablet (20 mg total) by mouth daily.   HYDROXYZINE (ATARAX/VISTARIL) 25 MG TABLET    Take 1 tablet (25 mg total) by mouth every 8 (eight) hours as needed for anxiety.   IBUPROFEN (ADVIL) 200 MG TABLET    Take 200 mg by mouth every 6 (six) hours as needed.    METHADONE (DOLOPHINE) 10 MG/5ML SOLUTION    Take 120 mg by mouth daily.   ONDANSETRON (ZOFRAN) 4 MG TABLET    Take 1 tablet (4 mg total) by mouth every 8 (eight) hours as needed for nausea or vomiting.   SILVER SULFADIAZINE (SILVADENE) 1 % CREAM    Apply 1 application topically daily.  Modified Medications   No medications on file  Discontinued Medications   No medications on file    Allergies: Allergies  Allergen Reactions   Haldol [Haloperidol Decanoate] Other (See Comments)    Hallucinations   Nitrates, Organic Other  (See Comments)    headaches    Past Medical History: Past Medical History:  Diagnosis Date   Anxiety    Depression    Hepatitis C    Herpes infection    Hypertension    Kidney stones    Migraines    Narcotic abuse (HCC)     Social History: Social History   Socioeconomic History   Marital status: Unknown    Spouse name: Not on file   Number of children: 3   Years of education: Not on file   Highest education level: 9th grade  Occupational History   Not on file  Tobacco Use   Smoking status: Every Day    Packs/day: 1.00    Years: 0.50    Pack years: 0.50    Types: Cigarettes   Smokeless tobacco: Never  Vaping Use   Vaping Use: Never used  Substance and Sexual Activity   Alcohol use: No   Drug use: Not Currently    Types: Heroin, Marijuana    Comment:  smoked marijuana 4 days ago.  hasn't used heroin since 06/01/2014   Sexual activity: Not Currently    Birth control/protection: Surgical  Other Topics Concern   Not on file  Social History Narrative   ** Merged History Encounter **       Social Determinants  of Health   Financial Resource Strain: Not on file  Food Insecurity: Not on file  Transportation Needs: Not on file  Physical Activity: Not on file  Stress: Not on file  Social Connections: Not on file    Labs: Hepatitis C Lab Results  Component Value Date   HCVGENOTYPE 1a 01/28/2021   HCVRNAPCRQN 3,000 (H) 01/28/2021   HCVRNAPCRQN 356,000 (H) 01/03/2019   FIBROSTAGE F0 01/28/2021   Hepatitis B Lab Results  Component Value Date   HEPBSAG NON-REACTIVE 01/03/2019   Hepatitis A No results found for: HAV HIV Lab Results  Component Value Date   HIV NON-REACTIVE 01/03/2019   HIV Non Reactive 03/27/2018   Lab Results  Component Value Date   CREATININE 0.76 04/01/2021   CREATININE 0.70 01/28/2021   CREATININE 0.74 03/27/2020   CREATININE 0.76 02/12/2020   CREATININE 0.66 10/09/2019   Lab Results  Component Value Date   AST 14  04/01/2021   AST 14 01/28/2021   AST 23 10/09/2019   ALT 15 04/01/2021   ALT 13 01/28/2021   ALT 11 01/28/2021   INR 1.1 01/28/2021    Assessment: Gregg presents to clinic today for her Hepatitis C follow-up appointment. She is on Harvoni x 12 weeks and started on 03/27/21. She has only missed 1 dose in the last month. I congratulated her on her adherence over the last month and encouraged her to continue with this. She has had a lot of side effects over the last month with headaches, nausea, and insomnia. She is taking ibuprofen for headaches with little relief. I counseled to try naproxen or aleve to see if these help more. She was prescribed zofran to help with the nausea, but has not picked this up yet. She is planning to pick up today. She has been struggling to push through this treatment over the last month. I encouraged her that she has 1 month down and that she could do this for 2 more months. All concerns and questions addressed at her visit today.   Plan: Check HCV RNA today Follow up on 05/28/21 @ 11:30 with Erskin Burnet, PharmD PGY2 Infectious Diseases Pharmacy Resident

## 2021-04-29 NOTE — Progress Notes (Signed)
04/29/2021  HPI: Nancy Terry is a 45 y.o. female who presents to the [facility] pharmacy clinic for Hepatitis C follow-up.  Medication: Harvoni x 8 weeks   Start Date: 03/27/21   Hepatitis C Genotype: 1a   Fibrosis Score: F0   Hepatitis C RNA: 3,000 on 01/28/21  Patient Active Problem List   Diagnosis Date Noted   Hidradenitis 10/10/2019   Tobacco use 10/10/2019   Chronic pain syndrome 04/11/2018   Complex ovarian cyst 11/21/2017   Hypertension 11/21/2017   Menorrhagia 05/13/2017   Sciatica 05/13/2017   Migraines 05/07/2016   Hepatitis C 05/07/2016   Depression 04/05/2016   Anxiety and depression 04/05/2016   Narcotic abuse (HCC) 04/05/2016   Breast lump on right side at 10 o'clock position 03/12/2014   Pap smear of cervix shows high risk HPV present 03/12/2014    Patient's Medications  New Prescriptions   No medications on file  Previous Medications   ACETAMINOPHEN-CODEINE (TYLENOL #3) 300-30 MG TABLET    Take 1-2 tablets by mouth every 6 (six) hours as needed for moderate pain.   CLINDAMYCIN (CLEOCIN T) 1 % LOTION    Apply topically 2 (two) times daily.   FUROSEMIDE (LASIX) 20 MG TABLET    Take 1 tablet (20 mg total) by mouth daily.   HYDROXYZINE (ATARAX/VISTARIL) 25 MG TABLET    Take 1 tablet (25 mg total) by mouth every 8 (eight) hours as needed for anxiety.   IBUPROFEN (ADVIL) 200 MG TABLET    Take 200 mg by mouth every 6 (six) hours as needed.    METHADONE (DOLOPHINE) 10 MG/5ML SOLUTION    Take 120 mg by mouth daily.   ONDANSETRON (ZOFRAN) 4 MG TABLET    Take 1 tablet (4 mg total) by mouth every 8 (eight) hours as needed for nausea or vomiting.   SILVER SULFADIAZINE (SILVADENE) 1 % CREAM    Apply 1 application topically daily.  Modified Medications   No medications on file  Discontinued Medications   No medications on file    Allergies: Allergies  Allergen Reactions   Haldol [Haloperidol Decanoate] Other (See Comments)    Hallucinations   Nitrates, Organic  Other (See Comments)    headaches    Past Medical History: Past Medical History:  Diagnosis Date   Anxiety    Depression    Hepatitis C    Herpes infection    Hypertension    Kidney stones    Migraines    Narcotic abuse (HCC)     Social History: Social History   Socioeconomic History   Marital status: Unknown    Spouse name: Not on file   Number of children: 3   Years of education: Not on file   Highest education level: 9th grade  Occupational History   Not on file  Tobacco Use   Smoking status: Every Day    Packs/day: 1.00    Years: 0.50    Pack years: 0.50    Types: Cigarettes   Smokeless tobacco: Never  Vaping Use   Vaping Use: Never used  Substance and Sexual Activity   Alcohol use: No   Drug use: Not Currently    Types: Heroin, Marijuana    Comment:  smoked marijuana 4 days ago.  hasn't used heroin since 06/01/2014   Sexual activity: Not Currently    Birth control/protection: Surgical  Other Topics Concern   Not on file  Social History Narrative   ** Merged History Encounter **  Social Determinants of Health   Financial Resource Strain: Not on file  Food Insecurity: Not on file  Transportation Needs: Not on file  Physical Activity: Not on file  Stress: Not on file  Social Connections: Not on file    Labs: Hepatitis C Lab Results  Component Value Date   HCVGENOTYPE 1a 01/28/2021   HCVRNAPCRQN 3,000 (H) 01/28/2021   HCVRNAPCRQN 356,000 (H) 01/03/2019   FIBROSTAGE F0 01/28/2021   Hepatitis B Lab Results  Component Value Date   HEPBSAG NON-REACTIVE 01/03/2019   Hepatitis A No results found for: HAV HIV Lab Results  Component Value Date   HIV NON-REACTIVE 01/03/2019   HIV Non Reactive 03/27/2018   Lab Results  Component Value Date   CREATININE 0.76 04/01/2021   CREATININE 0.70 01/28/2021   CREATININE 0.74 03/27/2020   CREATININE 0.76 02/12/2020   CREATININE 0.66 10/09/2019   Lab Results  Component Value Date   AST 14  04/01/2021   AST 14 01/28/2021   AST 23 10/09/2019   ALT 15 04/01/2021   ALT 13 01/28/2021   ALT 11 01/28/2021   INR 1.1 01/28/2021    Assessment: Nancy Terry presents to clinic today for her Hepatitis C follow-up appointment. She is on Harvoni x 8 weeks and started on 03/27/21. She has only missed 1 dose in the last month. I congratulated her on her adherence over the last month and encouraged her to continue with this. She has had a lot of side effects over the last month with headaches, nausea, and insomnia. She is taking ibuprofen for headaches with little relief. I counseled to try naproxen or aleve to see if these help more. She was prescribed zofran to help with the nausea, but has not picked this up yet. She is planning to pick up today. She has been struggling to push through this treatment over the last month. I encouraged her that she has 1 month down and that she could do this for 2 more months. All concerns and questions addressed at her visit today.     Plan: Check HCV RNA today Follow up on 05/28/21 @ 11:30 with Erskin Burnet, PharmD PGY2 Infectious Diseases Pharmacy Resident

## 2021-05-01 LAB — HEPATITIS C RNA QUANTITATIVE
HCV Quantitative Log: <1.18 log IU/mL
HCV RNA, PCR, QN: <15 IU/mL

## 2021-05-06 ENCOUNTER — Ambulatory Visit: Payer: No Typology Code available for payment source | Admitting: Clinical

## 2021-05-08 ENCOUNTER — Telehealth: Payer: Self-pay | Admitting: Family Medicine

## 2021-05-08 NOTE — Telephone Encounter (Signed)
Copied from CRM [id number]. Topic: Appointment Scheduling - Scheduling Inquiry for Clinic >> May 08, 2021  8:43 AM Gaetana Michaelis A wrote: Reason for CRM: The patient would like to be contacted to reschedule their appointment with A. McCoy   Please contact further when possible   Agent was unable to successfully reschedule at the time off call

## 2021-05-08 NOTE — Telephone Encounter (Signed)
Copied from CRM [id number]. Topic: General - Other >> May 08, 2021  8:44 AM Gaetana Michaelis A wrote: Reason for CRM: The patient would like to be contacted by Mr. Mikle Bosworth when possible  The patient would like to discuss their IRS paperwork when possible  Please contact further when available

## 2021-05-08 NOTE — Telephone Encounter (Signed)
I return Pt call, explain that she is approve for the [facility] discount and the IRS  letter she need to bring it for her re-new

## 2021-05-11 ENCOUNTER — Telehealth: Payer: Self-pay | Admitting: Pharmacist

## 2021-05-11 NOTE — Telephone Encounter (Signed)
Nancy Terry called clinic today expressing further concerns about her current Harvoni regimen. She states she doesn't remember when but that she likely missed 2-3 days of her medicine. She is taking care of her daughter and her new grandchild along with her mother, and I stated it is alright that she missed a couple of doses. Reviewed her labs and encouraged her that her HCV RNA has dropped to undetectable since starting her pills.  She complains of continued insomnia, headaches, and nausea today. She has still not picked up her Zofran, so encouraged her to do that today. She stated she would. I encouraged her to take the Zofran with her Harvoni in the evening so that the side effects may be minimized while she sleeps. Discussed that Zofran may cause drowsiness as well, and she could always try melatonin to help with the insomnia for now. She does not want to take the Harvoni in the morning as it hurts her stomach if she takes with her methadone. She can take ibuprofen for her headaches.  She also asked about taking her hydroxyzine with the Harvoni which is fine. Encouraged her to take it as this may reduce her anxiety around taking the Harvoni. She also stated she has been on menstrual cycle for 2 weeks, and I stated that she should follow up with her PCP/OBGYN for any concerns.  Margarite Gouge, PharmD, CPP Clinical Pharmacist Practitioner Infectious Diseases Clinical Pharmacist [facility]

## 2021-05-21 ENCOUNTER — Telehealth: Payer: Self-pay | Admitting: Family Medicine

## 2021-05-21 NOTE — Telephone Encounter (Signed)
Call placed to patient and vm was left informing patient to return phone call.

## 2021-05-21 NOTE — Telephone Encounter (Signed)
Copied from [place] [id number]. Topic: General - Other >> May 20, 2021  2:36 PM Pawlus, Nancy Terry wrote: Reason for CRM: Pt called in with some questions regarding getting Terry colonoscopy and having Terry kit sent to her, please advise.

## 2021-05-26 ENCOUNTER — Telehealth: Payer: Self-pay

## 2021-05-26 ENCOUNTER — Other Ambulatory Visit: Payer: Self-pay

## 2021-05-26 MED ORDER — GABAPENTIN 300 MG PO CAPS
300.0000 mg | ORAL_CAPSULE | Freq: Every day | ORAL | 3 refills | Status: DC
  Filled 2021-05-26 – 2021-08-04 (×3): qty 30, 30d supply, fill #0
  Filled 2021-09-01: qty 30, 30d supply, fill #1
  Filled 2021-10-09 – 2021-10-12 (×2): qty 30, 30d supply, fill #2

## 2021-05-26 NOTE — Telephone Encounter (Signed)
Pt would like A.McCoy to call her and reschedule her appt.

## 2021-05-26 NOTE — Telephone Encounter (Signed)
Pt returning your call.  No answer at the office.  Pt aware you will call her back.  She said Dr Margarita Rana mentioned her doing a colo guard, but she has never received a kit

## 2021-05-26 NOTE — Telephone Encounter (Signed)
Pt would like to re start gabapentin for pain in legs.  Send to to [facility].

## 2021-05-26 NOTE — Telephone Encounter (Signed)
I have sent a prescription to the Pharmacy for her.

## 2021-05-27 ENCOUNTER — Other Ambulatory Visit: Payer: Self-pay

## 2021-05-27 NOTE — Telephone Encounter (Signed)
Call placed to patient and VM was left informing patient that medication has been sent to pharmacy.

## 2021-05-28 ENCOUNTER — Ambulatory Visit: Payer: Medicaid Other | Admitting: Pharmacist

## 2021-06-02 ENCOUNTER — Other Ambulatory Visit: Payer: Self-pay

## 2021-06-11 ENCOUNTER — Other Ambulatory Visit: Payer: Self-pay

## 2021-06-11 ENCOUNTER — Ambulatory Visit (INDEPENDENT_AMBULATORY_CARE_PROVIDER_SITE_OTHER): Payer: No Typology Code available for payment source | Admitting: Pharmacist

## 2021-06-11 DIAGNOSIS — B182 Chronic viral hepatitis C: Secondary | ICD-10-CM

## 2021-06-11 NOTE — Progress Notes (Signed)
HPI: Nancy Terry is a 46 y.o. female who presents to the [facility] pharmacy clinic for Hepatitis C follow-up.  Medication: Harvoni x 8 weeks  Start Date: 03/31/21  Hepatitis C Genotype: 1a  Fibrosis Score: F0  Hepatitis C RNA: 356,000 (7/20) > 3,000 (8/22) > UD (11/22)  Patient Active Problem List   Diagnosis Date Noted   Hidradenitis 10/10/2019   Tobacco use 10/10/2019   Chronic pain syndrome 04/11/2018   Complex ovarian cyst 11/21/2017   Hypertension 11/21/2017   Menorrhagia 05/13/2017   Sciatica 05/13/2017   Migraines 05/07/2016   Hepatitis C 05/07/2016   Depression 04/05/2016   Anxiety and depression 04/05/2016   Narcotic abuse (HCC) 04/05/2016   Breast lump on right side at 10 o'clock position 03/12/2014   Pap smear of cervix shows high risk HPV present 03/12/2014    Patient's Medications  New Prescriptions   No medications on file  Previous Medications   ACETAMINOPHEN-CODEINE (TYLENOL #3) 300-30 MG TABLET    Take 1-2 tablets by mouth every 6 (six) hours as needed for moderate pain.   CLINDAMYCIN (CLEOCIN T) 1 % LOTION    Apply topically 2 (two) times daily.   FUROSEMIDE (LASIX) 20 MG TABLET    Take 1 tablet (20 mg total) by mouth daily.   GABAPENTIN (NEURONTIN) 300 MG CAPSULE    Take 1 capsule (300 mg total) by mouth at bedtime.   HYDROXYZINE (ATARAX/VISTARIL) 25 MG TABLET    Take 1 tablet (25 mg total) by mouth every 8 (eight) hours as needed for anxiety.   IBUPROFEN (ADVIL) 200 MG TABLET    Take 200 mg by mouth every 6 (six) hours as needed.    METHADONE (DOLOPHINE) 10 MG/5ML SOLUTION    Take 120 mg by mouth daily.   ONDANSETRON (ZOFRAN) 4 MG TABLET    Take 1 tablet (4 mg total) by mouth every 8 (eight) hours as needed for nausea or vomiting.   SILVER SULFADIAZINE (SILVADENE) 1 % CREAM    Apply 1 application topically daily.  Modified Medications   No medications on file  Discontinued Medications   No medications on file    Allergies: Allergies  Allergen  Reactions   Haldol [Haloperidol Decanoate] Other (See Comments)    Hallucinations   Nitrates, Organic Other (See Comments)    headaches    Past Medical History: Past Medical History:  Diagnosis Date   Anxiety    Depression    Hepatitis C    Herpes infection    Hypertension    Kidney stones    Migraines    Narcotic abuse (HCC)     Social History: Social History   Socioeconomic History   Marital status: Unknown    Spouse name: Not on file   Number of children: 3   Years of education: Not on file   Highest education level: 9th grade  Occupational History   Not on file  Tobacco Use   Smoking status: Every Day    Packs/day: 1.00    Years: 0.50    Pack years: 0.50    Types: Cigarettes   Smokeless tobacco: Never  Vaping Use   Vaping Use: Never used  Substance and Sexual Activity   Alcohol use: No   Drug use: Not Currently    Types: Heroin, Marijuana    Comment:  smoked marijuana 4 days ago.  hasn't used heroin since 06/01/2014   Sexual activity: Not Currently    Birth control/protection: Surgical  Other Topics Concern  Not on file  Social History Narrative   ** Merged History Encounter **       Social Determinants of Health   Financial Resource Strain: Not on file  Food Insecurity: Not on file  Transportation Needs: Not on file  Physical Activity: Not on file  Stress: Not on file  Social Connections: Not on file    Labs: Hepatitis C Lab Results  Component Value Date   HCVGENOTYPE 1a 01/28/2021   HCVRNAPCRQN <15 04/29/2021   HCVRNAPCRQN 3,000 (H) 01/28/2021   HCVRNAPCRQN 356,000 (H) 01/03/2019   FIBROSTAGE F0 01/28/2021   Hepatitis B Lab Results  Component Value Date   HEPBSAG NON-REACTIVE 01/03/2019   Hepatitis A No results found for: HAV HIV Lab Results  Component Value Date   HIV NON-REACTIVE 01/03/2019   HIV Non Reactive 03/27/2018   Lab Results  Component Value Date   CREATININE 0.76 04/01/2021   CREATININE 0.70 01/28/2021    CREATININE 0.74 03/27/2020   CREATININE 0.76 02/12/2020   CREATININE 0.66 10/09/2019   Lab Results  Component Value Date   AST 14 04/01/2021   AST 14 01/28/2021   AST 23 10/09/2019   ALT 15 04/01/2021   ALT 13 01/28/2021   ALT 11 01/28/2021   INR 1.1 01/28/2021    Assessment: Nancy Terry presents to clinic today for HCV follow-up. She has been taking Harvoni for 8 weeks and is tolerating the medicine well. Besides her couple missed doses in November, she did not take her medicine in Christmas day but otherwise took all her pills. She stated that since she has been off medications for a few days her side effects such as nausea, insomnia, and headaches have improved but are still present. Discussed that it will take a few days for the medicine to clear from her body and that her symptoms should resolve soon. Will check HCV RNA today and follow-up in 3 months.   Plan: Check HCV RNA Follow-up with me in 3 months for cure visit  Margarite Gouge, PharmD, CPP Clinical Pharmacist Practitioner Infectious Diseases Clinical Pharmacist [facility] 06/11/2021, 12:26 PM

## 2021-06-13 LAB — HEPATITIS C RNA QUANTITATIVE
HCV Quantitative Log: <1.18 log IU/mL
HCV RNA, PCR, QN: <15 IU/mL

## 2021-06-16 NOTE — Telephone Encounter (Signed)
I attempted to call pt no answer, left vm,

## 2021-06-19 ENCOUNTER — Telehealth: Payer: Self-pay | Admitting: Clinical

## 2021-06-19 NOTE — Telephone Encounter (Signed)
I attempted to call pt after receiving vm. No answer, left vm.

## 2021-06-25 NOTE — Telephone Encounter (Signed)
Pt returned Asante''s phone number. Pt says that her phone is fixed, pt would like a call back.

## 2021-06-26 NOTE — Telephone Encounter (Signed)
I spoke with pt and scheduled appt for 07/13/21

## 2021-07-13 ENCOUNTER — Other Ambulatory Visit: Payer: Self-pay

## 2021-07-13 ENCOUNTER — Telehealth: Payer: Self-pay | Admitting: Clinical

## 2021-07-13 ENCOUNTER — Ambulatory Visit: Payer: Self-pay | Attending: Family Medicine | Admitting: Clinical

## 2021-07-16 NOTE — BH Specialist Note (Signed)
Pt left before being seen. LCSWA contacted pt who stated that she had her wisdom teeth pulled and she was experiencing pain and bleeding. She stated that she wanted to go rest. LCSWA rescheduled appt with pt.

## 2021-07-28 ENCOUNTER — Other Ambulatory Visit: Payer: Self-pay

## 2021-07-31 ENCOUNTER — Other Ambulatory Visit: Payer: Self-pay

## 2021-08-04 ENCOUNTER — Other Ambulatory Visit: Payer: Self-pay

## 2021-08-05 ENCOUNTER — Ambulatory Visit: Payer: No Typology Code available for payment source | Admitting: Clinical

## 2021-08-10 NOTE — Telephone Encounter (Signed)
Error

## 2021-09-01 ENCOUNTER — Other Ambulatory Visit: Payer: Self-pay

## 2021-09-04 ENCOUNTER — Other Ambulatory Visit: Payer: Self-pay

## 2021-09-09 ENCOUNTER — Other Ambulatory Visit: Payer: Self-pay

## 2021-09-09 ENCOUNTER — Ambulatory Visit (INDEPENDENT_AMBULATORY_CARE_PROVIDER_SITE_OTHER): Payer: No Typology Code available for payment source | Admitting: Pharmacist

## 2021-09-09 ENCOUNTER — Ambulatory Visit: Payer: No Typology Code available for payment source | Admitting: Clinical

## 2021-09-09 DIAGNOSIS — B182 Chronic viral hepatitis C: Secondary | ICD-10-CM

## 2021-09-09 NOTE — Progress Notes (Signed)
? ?HPI: Nancy Terry is a 46 y.o. female who presents to the [facility] pharmacy clinic for Hepatitis C follow-up. ? ?Medication: Harvoni x 8 weeks ? ?Start Date: 03/31/21 ? ?Hepatitis C Genotype: 1a ? ?Fibrosis Score: F0 ? ?Hepatitis C RNA: 10/22 (3000) > undetectable in November and January ? ?Patient Active Problem List  ? Diagnosis Date Noted  ? Hidradenitis 10/10/2019  ? Tobacco use 10/10/2019  ? Chronic pain syndrome 04/11/2018  ? Complex ovarian cyst 11/21/2017  ? Hypertension 11/21/2017  ? Menorrhagia 05/13/2017  ? Sciatica 05/13/2017  ? Migraines 05/07/2016  ? Hepatitis C 05/07/2016  ? Depression 04/05/2016  ? Anxiety and depression 04/05/2016  ? Narcotic abuse (HCC) 04/05/2016  ? Breast lump on right side at 10 o'clock position 03/12/2014  ? Pap smear of cervix shows high risk HPV present 03/12/2014  ? ? ?Patient's Medications  ?New Prescriptions  ? No medications on file  ?Previous Medications  ? ACETAMINOPHEN-CODEINE (TYLENOL #3) 300-30 MG TABLET    Take 1-2 tablets by mouth every 6 (six) hours as needed for moderate pain.  ? CLINDAMYCIN (CLEOCIN T) 1 % LOTION    Apply topically 2 (two) times daily.  ? FUROSEMIDE (LASIX) 20 MG TABLET    Take 1 tablet (20 mg total) by mouth daily.  ? GABAPENTIN (NEURONTIN) 300 MG CAPSULE    Take 1 capsule (300 mg total) by mouth at bedtime.  ? HYDROXYZINE (ATARAX/VISTARIL) 25 MG TABLET    Take 1 tablet (25 mg total) by mouth every 8 (eight) hours as needed for anxiety.  ? IBUPROFEN (ADVIL) 200 MG TABLET    Take 200 mg by mouth every 6 (six) hours as needed.   ? METHADONE (DOLOPHINE) 10 MG/5ML SOLUTION    Take 120 mg by mouth daily.  ? ONDANSETRON (ZOFRAN) 4 MG TABLET    Take 1 tablet (4 mg total) by mouth every 8 (eight) hours as needed for nausea or vomiting.  ? SILVER SULFADIAZINE (SILVADENE) 1 % CREAM    Apply 1 application topically daily.  ?Modified Medications  ? No medications on file  ?Discontinued Medications  ? No medications on file  ? ? ?Allergies: ?Allergies   ?Allergen Reactions  ? Haldol [Haloperidol Decanoate] Other (See Comments)  ?  Hallucinations  ? Nitrates, Organic Other (See Comments)  ?  headaches  ? ? ?Past Medical History: ?Past Medical History:  ?Diagnosis Date  ? Anxiety   ? Depression   ? Hepatitis C   ? Herpes infection   ? Hypertension   ? Kidney stones   ? Migraines   ? Narcotic abuse (HCC)   ? ? ?Social History: ?Social History  ? ?Socioeconomic History  ? Marital status: Unknown  ?  Spouse name: Not on file  ? Number of children: 3  ? Years of education: Not on file  ? Highest education level: 9th grade  ?Occupational History  ? Not on file  ?Tobacco Use  ? Smoking status: Every Day  ?  Packs/day: 1.00  ?  Years: 0.50  ?  Pack years: 0.50  ?  Types: Cigarettes  ? Smokeless tobacco: Never  ?Vaping Use  ? Vaping Use: Never used  ?Substance and Sexual Activity  ? Alcohol use: No  ? Drug use: Not Currently  ?  Types: Heroin, Marijuana  ?  Comment:  smoked marijuana 4 days ago.  hasn't used heroin since 06/01/2014  ? Sexual activity: Not Currently  ?  Birth control/protection: Surgical  ?Other Topics Concern  ?  Not on file  ?Social History Narrative  ? ** Merged History Encounter **  ?    ? ?Social Determinants of Health  ? ?Financial Resource Strain: Not on file  ?Food Insecurity: Not on file  ?Transportation Needs: Not on file  ?Physical Activity: Not on file  ?Stress: Not on file  ?Social Connections: Not on file  ? ? ?Labs: ?Hepatitis C ?Lab Results  ?Component Value Date  ? HCVGENOTYPE 1a 01/28/2021  ? HCVRNAPCRQN <15 06/11/2021  ? HCVRNAPCRQN <15 04/29/2021  ? HCVRNAPCRQN 3,000 (H) 01/28/2021  ? HCVRNAPCRQN 356,000 (H) 01/03/2019  ? FIBROSTAGE F0 01/28/2021  ? ?Hepatitis B ?Lab Results  ?Component Value Date  ? HEPBSAG NON-REACTIVE 01/03/2019  ? ?Hepatitis A ?No results found for: HAV ?HIV ?Lab Results  ?Component Value Date  ? HIV NON-REACTIVE 01/03/2019  ? HIV Non Reactive 03/27/2018  ? ?Lab Results  ?Component Value Date  ? CREATININE 0.76  04/01/2021  ? CREATININE 0.70 01/28/2021  ? CREATININE 0.74 03/27/2020  ? CREATININE 0.76 02/12/2020  ? CREATININE 0.66 10/09/2019  ? ?Lab Results  ?Component Value Date  ? AST 14 04/01/2021  ? AST 14 01/28/2021  ? AST 23 10/09/2019  ? ALT 15 04/01/2021  ? ALT 13 01/28/2021  ? ALT 11 01/28/2021  ? INR 1.1 01/28/2021  ? ? ?Assessment: ?Nancy Terry presents to clinic today for HCV SVR cure visit. She completed Harvoni x 8 weeks in January for her HCV infection and states today she feels much better than at previous visits. She has not experienced any more nausea or fatigue since our last visit. We will check hepatitis C RNA today and will say she is cured if her HCV RNA remains undetectable. She will not require follow-up with [place] given her lack of fibrosis.  ?  ?Plan: ?Check HCV RNA ?Will call patient with results  ?No follow-up needed ? ?Margarite Gouge, PharmD, CPP ?Clinical Pharmacist Practitioner ?Infectious Diseases Clinical Pharmacist ?[facility] ?09/09/2021, 11:59 AM ? ?

## 2021-09-11 LAB — HEPATITIS C RNA QUANTITATIVE
HCV Quantitative Log: <1.18 log IU/mL
HCV RNA, PCR, QN: <15 IU/mL

## 2021-09-14 ENCOUNTER — Encounter: Payer: Self-pay | Admitting: Pharmacist

## 2021-09-23 ENCOUNTER — Telehealth: Payer: Self-pay | Admitting: Clinical

## 2021-09-23 NOTE — Telephone Encounter (Signed)
I attempted to call pt, no answer left vm. I wanted to inform pt that I am going to refer her to [facility].  ?

## 2021-10-02 ENCOUNTER — Telehealth: Payer: Self-pay | Admitting: Clinical

## 2021-10-02 NOTE — Telephone Encounter (Signed)
I spoke with pt and provided her with information on SAIOP with the [place] and [facility]. ?

## 2021-10-02 NOTE — Telephone Encounter (Signed)
I attempted to call pt again to inform her of my departure from [facility], she has no showed several appts. I have sent pt SAIOP resources in the mail. ?

## 2021-10-05 ENCOUNTER — Telehealth: Payer: Self-pay | Admitting: Family Medicine

## 2021-10-05 NOTE — Telephone Encounter (Signed)
Pt is calling to reschedule her orange card. ?[id number] ?

## 2021-10-06 NOTE — Telephone Encounter (Signed)
Call placed to patient to inform her that [facility], LCSW is going to refer her to [employer] [facility]. Message left with call back requested to this CM ?

## 2021-10-08 ENCOUNTER — Ambulatory Visit: Payer: Self-pay | Attending: Physician Assistant | Admitting: Physician Assistant

## 2021-10-08 ENCOUNTER — Encounter: Payer: Self-pay | Admitting: Physician Assistant

## 2021-10-08 ENCOUNTER — Other Ambulatory Visit (HOSPITAL_COMMUNITY)
Admission: RE | Admit: 2021-10-08 | Discharge: 2021-10-08 | Disposition: A | Discharge status: Home / Self Care | Payer: Medicaid Other | Source: Ambulatory Visit | Attending: Physician Assistant | Admitting: Physician Assistant

## 2021-10-08 ENCOUNTER — Other Ambulatory Visit: Payer: Self-pay

## 2021-10-08 VITALS — BP 145/83 | HR 57 | Wt 210.2 lb

## 2021-10-08 DIAGNOSIS — N924 Excessive bleeding in the premenopausal period: Secondary | ICD-10-CM

## 2021-10-08 DIAGNOSIS — F12188 Cannabis abuse with other cannabis-induced disorder: Secondary | ICD-10-CM | POA: Insufficient documentation

## 2021-10-08 DIAGNOSIS — N898 Other specified noninflammatory disorders of vagina: Secondary | ICD-10-CM | POA: Diagnosis present

## 2021-10-08 DIAGNOSIS — N949 Unspecified condition associated with female genital organs and menstrual cycle: Secondary | ICD-10-CM

## 2021-10-08 DIAGNOSIS — L98491 Non-pressure chronic ulcer of skin of other sites limited to breakdown of skin: Secondary | ICD-10-CM | POA: Insufficient documentation

## 2021-10-08 DIAGNOSIS — W57XXXA Bitten or stung by nonvenomous insect and other nonvenomous arthropods, initial encounter: Secondary | ICD-10-CM | POA: Insufficient documentation

## 2021-10-08 DIAGNOSIS — R7303 Prediabetes: Secondary | ICD-10-CM | POA: Insufficient documentation

## 2021-10-08 DIAGNOSIS — R350 Frequency of micturition: Secondary | ICD-10-CM | POA: Insufficient documentation

## 2021-10-08 DIAGNOSIS — N939 Abnormal uterine and vaginal bleeding, unspecified: Secondary | ICD-10-CM | POA: Insufficient documentation

## 2021-10-08 LAB — POCT URINALYSIS DIP (CLINITEK)
Bilirubin, UA: NEGATIVE
Glucose, UA: NEGATIVE mg/dL
Ketones, POC UA: NEGATIVE mg/dL
Leukocytes, UA: NEGATIVE
Nitrite, UA: NEGATIVE
POC PROTEIN,UA: NEGATIVE
Spec Grav, UA: >=1.03 — AB (ref 1.010–1.025)
Urobilinogen, UA: 0.2 E.U./dL
pH, UA: 6 (ref 5.0–8.0)

## 2021-10-08 MED ORDER — FLUCONAZOLE 150 MG PO TABS
150.0000 mg | ORAL_TABLET | Freq: Once | ORAL | 0 refills | Status: AC
  Filled 2021-10-08: qty 1, 1d supply, fill #0

## 2021-10-08 MED ORDER — DOXYCYCLINE HYCLATE 100 MG PO TABS
100.0000 mg | ORAL_TABLET | Freq: Two times a day (BID) | ORAL | 0 refills | Status: DC
  Filled 2021-10-08: qty 20, 10d supply, fill #0

## 2021-10-08 NOTE — Progress Notes (Signed)
Patient ID: Nancy Terry, female   DOB: August 19, 1975, 46 y.o.   MRN: [id number] ? ? ?Nancy Terry, is a 46 y.o. female ? ?[id number] ? ?[id number] ? ?DOB - Jun 14, 1975 ? ?No chief complaint on file. ?    ? ?Subjective:  ? ?Nancy Terry is a 46 y.o. female here today for frequent urination and vaginal burning.  No pelvic pain.  She had a tick bite about 3 weeks ago and about 1 month ago.  She noticed a rash and had 2 days of temp around 100. ? ?Needs to see gyn regarding heavy periods and previous breast surgery ? ? ?Problem  ?Skin Ulcer, Limited to Breakdown of Skin (Hcc)  ?Cannabis Hyperemesis Syndrome Concurrent With and Due to Cannabis Abuse (Hcc)  ?Morbid Obesity (Hcc)  ? ? ?ALLERGIES: ?Allergies  ?Allergen Reactions  ? Haldol [Haloperidol Decanoate] Other (See Comments)  ?  Hallucinations  ? Nitrates, Organic Other (See Comments)  ?  headaches  ? ? ?PAST MEDICAL HISTORY: ?Past Medical History:  ?Diagnosis Date  ? Anxiety   ? Depression   ? Hepatitis C   ? Herpes infection   ? Hypertension   ? Kidney stones   ? Migraines   ? Narcotic abuse (HCC)   ? ? ?MEDICATIONS AT HOME: ?Prior to Admission medications   ?Medication Sig Start Date End Date Taking? Authorizing Provider  ?clindamycin (CLEOCIN T) 1 % lotion Apply topically 2 (two) times daily. 11/18/20  Yes Hoy Register, MD  ?doxycycline (VIBRA-TABS) 100 MG tablet Take 1 tablet (100 mg total) by mouth 2 (two) times daily. 10/08/21  Yes Anders Simmonds, PA-C  ?fluconazole (DIFLUCAN) 150 MG tablet Take 1 tablet (150 mg total) by mouth once for 1 dose. 10/08/21 10/08/21 Yes Rondrick Barreira, Marzella Schlein, PA-C  ?gabapentin (NEURONTIN) 300 MG capsule Take 1 capsule (300 mg total) by mouth at bedtime. 05/26/21  Yes Hoy Register, MD  ?hydrOXYzine (ATARAX/VISTARIL) 25 MG tablet Take 1 tablet (25 mg total) by mouth every 8 (eight) hours as needed for anxiety. 10/10/20  Yes Raspet, Erin K, PA-C  ?ibuprofen (ADVIL) 200 MG tablet Take 200 mg by mouth every 6 (six) hours as needed.    Yes  [provider]  ?methadone (DOLOPHINE) 10 MG/5ML solution Take 120 mg by mouth daily.   Yes [provider]  ?ondansetron (ZOFRAN) 4 MG tablet Take 1 tablet (4 mg total) by mouth every 8 (eight) hours as needed for nausea or vomiting. 04/17/21  Yes Jennette Kettle, RPH-CPP  ?silver sulfADIAZINE (SILVADENE) 1 % cream Apply 1 application topically daily. 05/02/20  Yes Eustace Moore, MD  ?acetaminophen-codeine (TYLENOL #3) 300-30 MG tablet Take 1-2 tablets by mouth every 6 (six) hours as needed for moderate pain. ?Patient not taking: Reported on 10/08/2021 05/02/20   Eustace Moore, MD  ?furosemide (LASIX) 20 MG tablet Take 1 tablet (20 mg total) by mouth daily. ?Patient not taking: Reported on 10/08/2021 05/02/20   Eustace Moore, MD  ? ? ?ROS: ?Neg HEENT ?Neg resp ?Neg cardiac ?Neg GI ?Neg MS ?Neg psych ?Neg neuro ? ?Objective:  ? ?Vitals:  ? 10/08/21 1402  ?BP: (!) 145/83  ?Pulse: (!) 57  ?SpO2: 97%  ?Weight: 210 lb 3.2 oz (95.3 kg)  ? ?Exam ?General appearance : Awake, alert, not in any distress. Speech Clear. Not toxic looking ?HEENT: Atraumatic and NormocephalicNeck: Supple, no JVD. No cervical lymphadenopathy.  ?Chest: Good air entry bilaterally, CTAB.  No rales/rhonchi/wheezing ?CVS: S1 S2 regular, no murmurs.  ?Extremities:  B/L Lower Ext shows no edema, both legs are warm to touch ?Neurology: Awake alert, and oriented X 3, CN II-XII intact, Non focal ?Skin: No Rash now ? ?Data Review ?Lab Results  ?Component Value Date  ? HGBA1C 5.7 (H) 04/01/2021  ? HGBA1C 5.3 06/14/2014  ? ? ?Assessment & Plan  ? ?1. Vaginal discomfort ?Will cover for UTI/tick fever/ ?- POCT URINALYSIS DIP (CLINITEK) ?- Cervicovaginal ancillary only ?- Ambulatory referral to Gynecology ?- doxycycline (VIBRA-TABS) 100 MG tablet; Take 1 tablet (100 mg total) by mouth 2 (two) times daily.  Dispense: 20 tablet; Refill: 0 ?- fluconazole (DIFLUCAN) 150 MG tablet; Take 1 tablet (150 mg total) by mouth once for 1 dose.   Dispense: 1 tablet; Refill: 0 ? ?2. Prediabetes ?I have had a lengthy discussion and provided education about insulin resistance and the intake of too much sugar/refined carbohydrates.  I have advised the patient to work at a goal of eliminating sugary drinks, candy, desserts, sweets, refined sugars, processed foods, and white carbohydrates.  The patient expresses understanding.  ?- Hemoglobin A1c ? ?3. Excessive bleeding in premenopausal period ?- Ambulatory referral to Gynecology ? ?4. Tick bite, unspecified site, initial encounter ?will ?- doxycycline (VIBRA-TABS) 100 MG tablet; Take 1 tablet (100 mg total) by mouth 2 (two) times daily.  Dispense: 20 tablet; Refill: 0 ? ?5. Frequent urination ?- doxycycline (VIBRA-TABS) 100 MG tablet; Take 1 tablet (100 mg total) by mouth 2 (two) times daily.  Dispense: 20 tablet; Refill: 0 ? ? ? ?Return in about 1 month (around 11/08/2021) for PCP for chronic conditions. ? ?The patient was given clear instructions to go to ER or return to medical center if symptoms don't improve, worsen or new problems develop. The patient verbalized understanding. The patient was told to call to get lab results if they haven't heard anything in the next week.  ? ? ? ? ?Georgian Co, PA-C ?[facility] ?[place], [place] ?[phone]   ?10/08/2021, 2:21 PM  ?

## 2021-10-09 ENCOUNTER — Other Ambulatory Visit: Payer: Self-pay

## 2021-10-09 LAB — HEMOGLOBIN A1C
Est. average glucose Bld gHb Est-mCnc: 108 mg/dL
Hgb A1c MFr Bld: 5.4 % (ref 4.8–5.6)

## 2021-10-09 LAB — CERVICOVAGINAL ANCILLARY ONLY
Bacterial Vaginitis (gardnerella): POSITIVE — AB
Candida Glabrata: NEGATIVE
Candida Vaginitis: NEGATIVE
Chlamydia: NEGATIVE
Comment: NEGATIVE
Comment: NEGATIVE
Comment: NEGATIVE
Comment: NEGATIVE
Comment: NEGATIVE
Comment: NORMAL
Neisseria Gonorrhea: NEGATIVE
Trichomonas: NEGATIVE

## 2021-10-12 ENCOUNTER — Other Ambulatory Visit: Payer: Self-pay

## 2021-10-12 ENCOUNTER — Other Ambulatory Visit: Payer: Self-pay | Admitting: Physician Assistant

## 2021-10-12 DIAGNOSIS — B9689 Other specified bacterial agents as the cause of diseases classified elsewhere: Secondary | ICD-10-CM

## 2021-10-12 MED ORDER — METRONIDAZOLE 500 MG PO TABS
500.0000 mg | ORAL_TABLET | Freq: Two times a day (BID) | ORAL | 0 refills | Status: DC
  Filled 2021-10-12: qty 14, 7d supply, fill #0

## 2021-10-13 NOTE — Telephone Encounter (Signed)
Pt given lab results per notes of Freeman Caldron, PA-C on 10/12/21. Pt verbalized understanding. She wants to know if these medications given (Diflucan, Flagyl, Doxycycline) will interfere with her Methadone she takes every morning. I advised they shouldn't interfere, but I will send her question to the provider. She says to send to Dr. Margarita Rana or Levada Dy, whichever one can answer.  ? ? ? ?Your vaginal swabs are positive for bacterial vaginitis.  This is just an imbalance in the bacteria in the vagina(not an STD).  There was no yeast.  No chlamydia/gonorrhea or trichomonas.  I sent you a prescription to the pharmacy for the bacterial vaginitis.  Follow up as planned, thanks, Freeman Caldron, PA-C  ?Written by Argentina Donovan, PA-C on 10/12/2021 12:33 PM EDT ?

## 2021-10-13 NOTE — Telephone Encounter (Signed)
No it should not

## 2021-10-13 NOTE — Telephone Encounter (Unsigned)
Copied from CRM [id number]. Topic: General - Other ?>> Oct 13, 2021 10:55 AM Gaetana Michaelis A wrote: ?Reason for CRM: The patient has expressly requested to speak with Dr. Alvis Lemmings or their CMA regarding their recent lab results and previous visit  ? ?Please contact further when possible ?

## 2021-10-14 IMAGING — MG MM BREAST BX W/ LOC DEV 1ST LESION IMAGE BX SPEC STEREO GUIDE*R*
8 of 13 series · 8 of 29 positions shown · non-contrast
Comparison: Previous exams.
COMPARISON: Previous exams.

Addendum:
CLINICAL DATA: 9 mm mass in the outer right breast at recent
mammography, not well seen sonographically.

EXAM:
RIGHT BREAST STEREOTACTIC CORE NEEDLE BIOPSY

[R (1 of 8)]
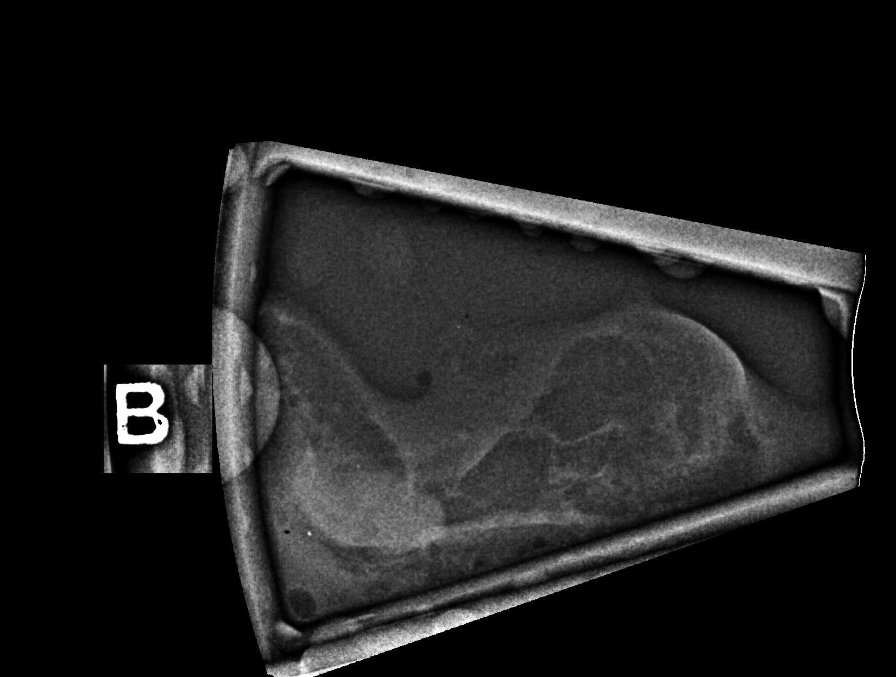

[R (2 of 8)]
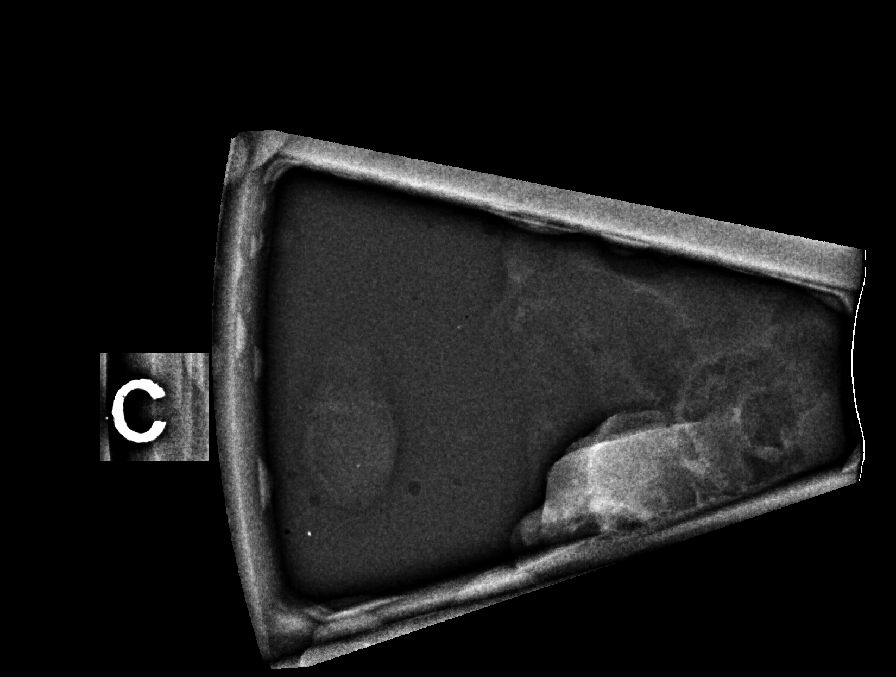

[R (3 of 8)]
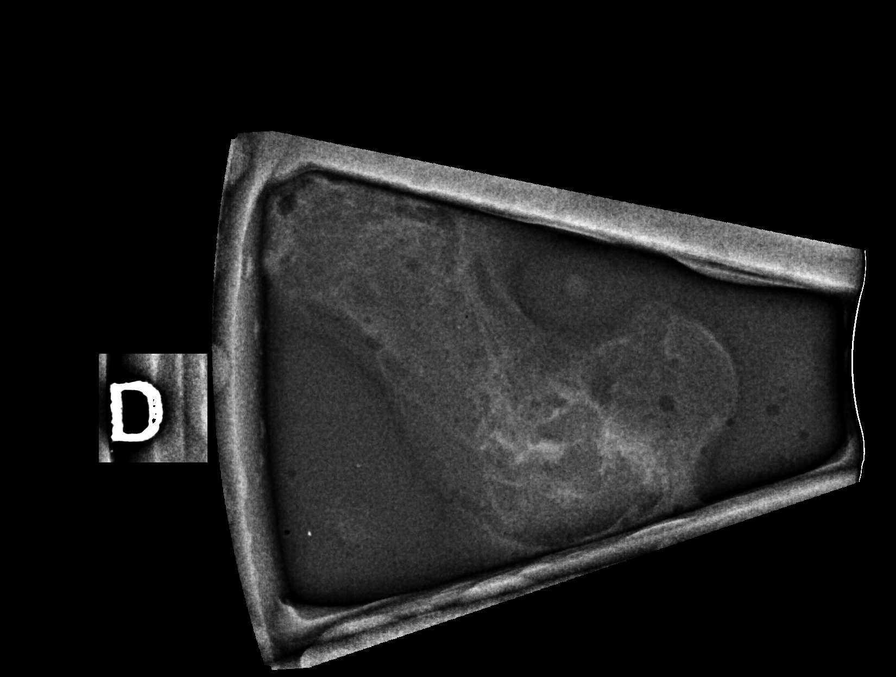

[R (4 of 8)]
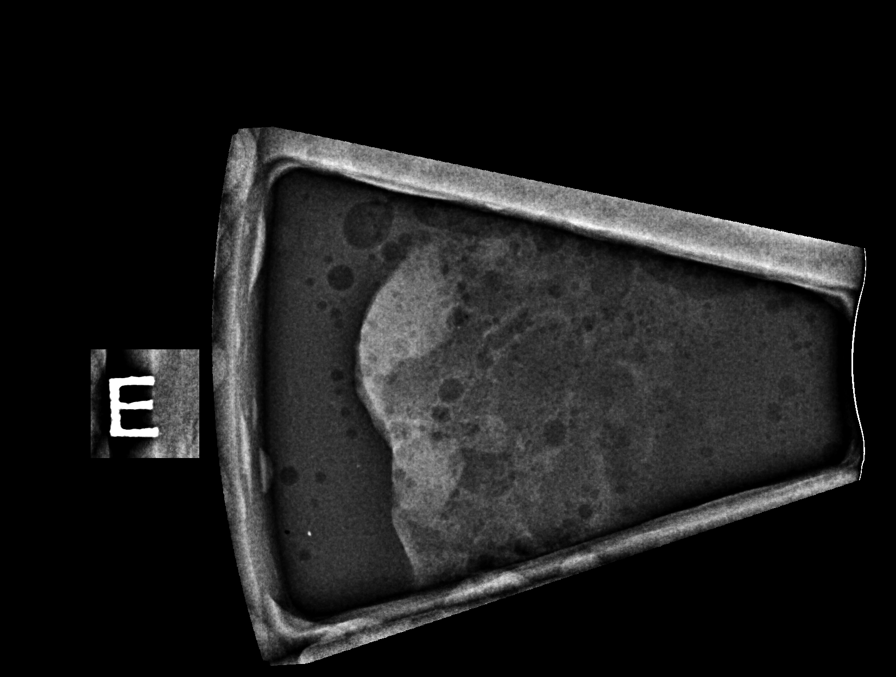

[R (5 of 8)]
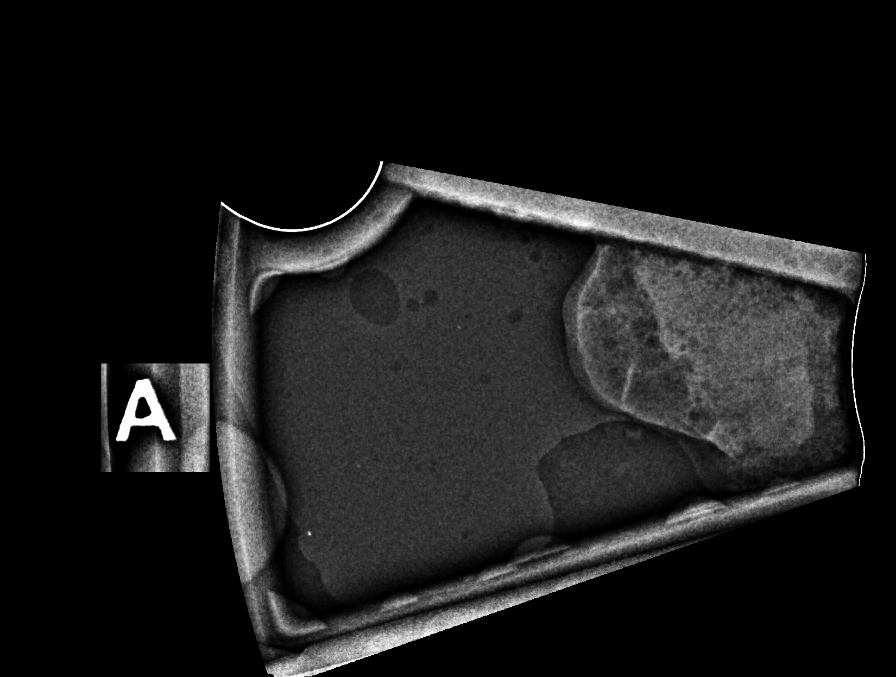

[R (6 of 8)]
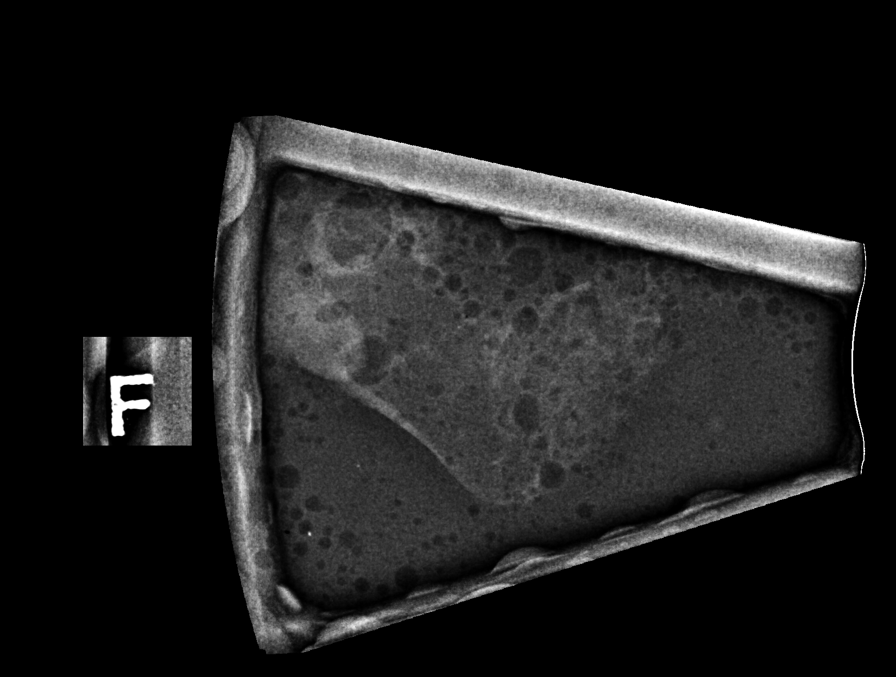

[R (7 of 8)]
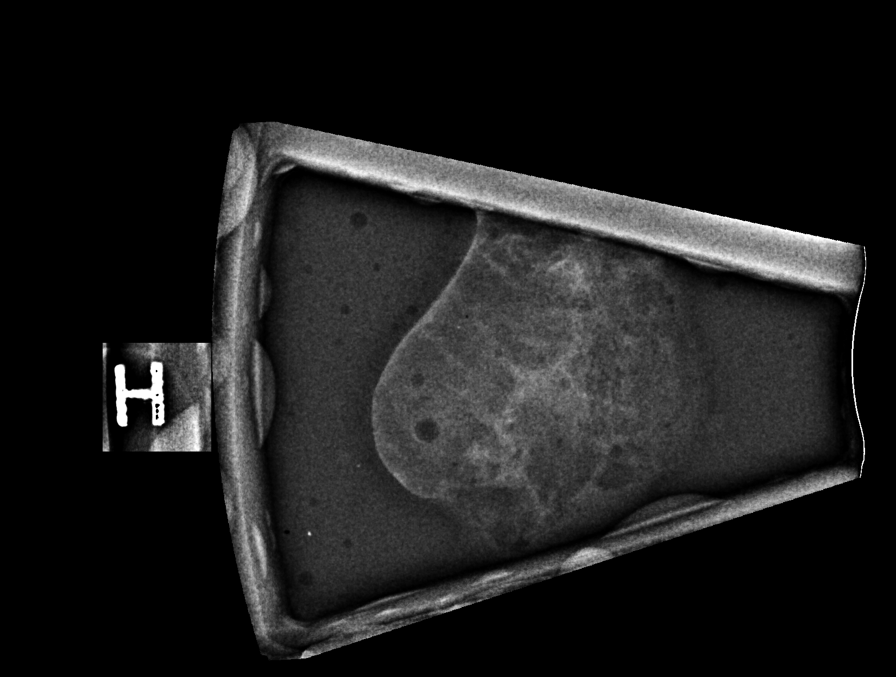

[R (8 of 8)]
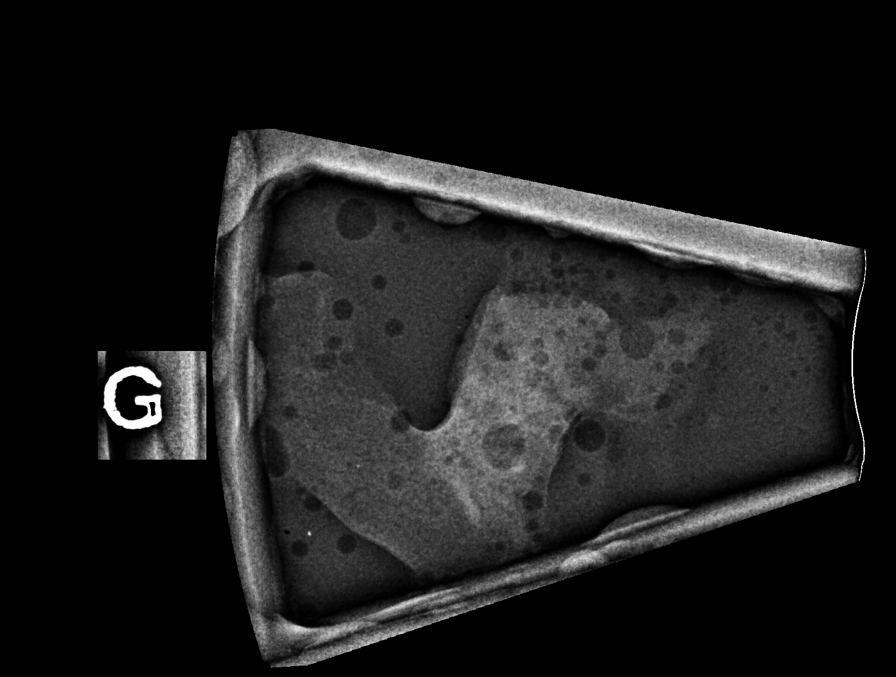

[8 of 29 positions shown; findings below may reference images not displayed]



Using sterile technique and 1% Lidocaine as local anesthetic, under
stereotactic guidance, a 9 gauge vacuum assisted device was used to
perform core needle biopsy of the recently demonstrated 9 mm oval,
circumscribed mass in the outer right breast using a lateral
approach.

At the conclusion of the procedure, a coil shaped tissue marker clip
was deployed into the biopsy cavity. Follow-up 2-view mammogram was
performed and dictated separately.
IMPRESSION: Stereotactic-guided biopsy of the recently demonstrated 9 mm mass in
the lateral right breast. No apparent complications.

ADDENDUM:
Pathology revealed FIBROADENOMA of the Right breast, lateral. This
was found to be concordant by Dr. Giancarlo Aluigi.

Pathology results were discussed with the patient by telephone. The
patient reported doing well after the biopsy with tenderness at the
site. Post biopsy instructions and care were reviewed and questions
were answered. The patient was encouraged to call The [REDACTED]

The patient was instructed to return for annual screening
mammography due in August 2020.

Pathology results reported by Shalley Jim, RN on 10/09/2019.



Using sterile technique and 1% Lidocaine as local anesthetic, under
stereotactic guidance, a 9 gauge vacuum assisted device was used to
perform core needle biopsy of the recently demonstrated 9 mm oval,
circumscribed mass in the outer right breast using a lateral
approach.

At the conclusion of the procedure, a coil shaped tissue marker clip
was deployed into the biopsy cavity. Follow-up 2-view mammogram was
performed and dictated separately.
IMPRESSION: Stereotactic-guided biopsy of the recently demonstrated 9 mm mass in
the lateral right breast. No apparent complications.

## 2021-10-14 NOTE — Telephone Encounter (Signed)
Called pt made aware of MD message . Verbalized understanding  ?

## 2021-10-15 NOTE — Telephone Encounter (Signed)
I called patient and informed her that [facility] was referring her to the [facility].  Asante is no longer in this office; but we should have a new SW starting next month. ? ?She had questions regarding her [employer] and [employer]. She is aware that there is not a Development worker, community in our clinic at this time. She has the fax number and mailing address for [facility] but did not have the phone number for questions about the program. I provided her with the phone number and also informed her that the [place] program has stopped and she needs to speak to [place] about patient assistance programs  ?

## 2021-11-10 ENCOUNTER — Other Ambulatory Visit: Payer: Self-pay | Admitting: Family Medicine

## 2021-11-10 ENCOUNTER — Other Ambulatory Visit: Payer: Self-pay

## 2021-11-10 MED ORDER — GABAPENTIN 300 MG PO CAPS
300.0000 mg | ORAL_CAPSULE | Freq: Every day | ORAL | 0 refills | Status: DC
Start: 2021-11-10 — End: 2022-03-09
  Filled 2021-11-10: qty 90, 90d supply, fill #0

## 2021-11-10 NOTE — Telephone Encounter (Signed)
Requested Prescriptions  Pending Prescriptions Disp Refills  . gabapentin (NEURONTIN) 300 MG capsule 30 capsule 3    Sig: Take 1 capsule (300 mg total) by mouth at bedtime.     Neurology: Anticonvulsants - gabapentin Passed - 11/10/2021 12:28 PM      Passed - Cr in normal range and within 360 days    Creat  Date Value Ref Range Status  01/28/2021 0.70 0.50 - 0.99 mg/dL Final   Creatinine, Ser  Date Value Ref Range Status  04/01/2021 0.76 0.57 - 1.00 mg/dL Final         Passed - Completed PHQ-2 or PHQ-9 in the last 360 days      Passed - Valid encounter within last 12 months    Recent Outpatient Visits          1 month ago Vaginal discomfort   [facility] [place], [place], [place]   7 months ago Annual physical exam   [facility] Hoy Register, MD   11 months ago Hidradenitis suppurativa   [facility] Hoy Register, MD   1 year ago Need for immunization against influenza   [facility] Grayce Sessions, NP   1 year ago Generalized abdominal pain   [facility], Shea Stakes, NP      Future Appointments            In 1 week Hoy Register, MD [facility]

## 2021-11-11 ENCOUNTER — Other Ambulatory Visit: Payer: Self-pay

## 2021-11-12 ENCOUNTER — Other Ambulatory Visit: Payer: Self-pay

## 2021-11-17 ENCOUNTER — Ambulatory Visit: Payer: No Typology Code available for payment source | Admitting: Family Medicine

## 2021-11-20 ENCOUNTER — Telehealth: Payer: Self-pay | Admitting: Family Medicine

## 2021-11-20 DIAGNOSIS — S025XXA Fracture of tooth (traumatic), initial encounter for closed fracture: Secondary | ICD-10-CM

## 2021-11-20 NOTE — Telephone Encounter (Signed)
Copied from CRM [id number]. Topic: Referral - Request for Referral >> Nov 20, 2021 12:36 PM Franchot Heidelberg wrote: Has patient seen PCP for this complaint? Yes.   *If NO, is insurance requiring patient see PCP for this issue before PCP can refer them? Referral for which specialty: GYN  Preferred provider/office: The provider she saw last, does not remember their name  Reason for referral: Needs a hysterectomy

## 2021-11-25 NOTE — Telephone Encounter (Signed)
Pt was called and given informed of referral information.   Pt is requesting referral to dentist for several broke teet. She has the OC

## 2021-11-25 NOTE — Telephone Encounter (Signed)
Done

## 2021-11-26 NOTE — Telephone Encounter (Signed)
Pt informed of referral being placed

## 2021-12-02 ENCOUNTER — Other Ambulatory Visit: Payer: Self-pay

## 2021-12-02 DIAGNOSIS — Z1231 Encounter for screening mammogram for malignant neoplasm of breast: Secondary | ICD-10-CM

## 2021-12-17 ENCOUNTER — Inpatient Hospital Stay: Admission: RE | Admit: 2021-12-17 | Payer: No Typology Code available for payment source | Source: Ambulatory Visit

## 2021-12-29 ENCOUNTER — Ambulatory Visit: Payer: No Typology Code available for payment source | Admitting: Family Medicine

## 2021-12-31 ENCOUNTER — Ambulatory Visit
Admission: RE | Admit: 2021-12-31 | Discharge: 2021-12-31 | Disposition: A | Discharge status: Home / Self Care | Payer: No Typology Code available for payment source | Source: Ambulatory Visit | Attending: Family Medicine | Admitting: Family Medicine

## 2021-12-31 DIAGNOSIS — Z1231 Encounter for screening mammogram for malignant neoplasm of breast: Secondary | ICD-10-CM

## 2022-02-03 ENCOUNTER — Other Ambulatory Visit (HOSPITAL_COMMUNITY)
Admission: RE | Admit: 2022-02-03 | Discharge: 2022-02-03 | Disposition: A | Discharge status: Home / Self Care | Payer: Medicaid Other | Source: Ambulatory Visit | Attending: Physician Assistant | Admitting: Physician Assistant

## 2022-02-03 ENCOUNTER — Other Ambulatory Visit: Payer: Self-pay

## 2022-02-03 ENCOUNTER — Ambulatory Visit: Payer: No Typology Code available for payment source | Attending: Physician Assistant | Admitting: Physician Assistant

## 2022-02-03 ENCOUNTER — Encounter: Payer: Self-pay | Admitting: Physician Assistant

## 2022-02-03 VITALS — BP 174/81 | HR 47 | Ht 63.0 in | Wt 213.0 lb

## 2022-02-03 DIAGNOSIS — R5383 Other fatigue: Secondary | ICD-10-CM | POA: Insufficient documentation

## 2022-02-03 DIAGNOSIS — R309 Painful micturition, unspecified: Secondary | ICD-10-CM | POA: Diagnosis present

## 2022-02-03 DIAGNOSIS — R350 Frequency of micturition: Secondary | ICD-10-CM

## 2022-02-03 DIAGNOSIS — F419 Anxiety disorder, unspecified: Secondary | ICD-10-CM | POA: Insufficient documentation

## 2022-02-03 DIAGNOSIS — G4440 Drug-induced headache, not elsewhere classified, not intractable: Secondary | ICD-10-CM

## 2022-02-03 DIAGNOSIS — N949 Unspecified condition associated with female genital organs and menstrual cycle: Secondary | ICD-10-CM

## 2022-02-03 DIAGNOSIS — R319 Hematuria, unspecified: Secondary | ICD-10-CM

## 2022-02-03 DIAGNOSIS — F17200 Nicotine dependence, unspecified, uncomplicated: Secondary | ICD-10-CM | POA: Insufficient documentation

## 2022-02-03 DIAGNOSIS — H6122 Impacted cerumen, left ear: Secondary | ICD-10-CM

## 2022-02-03 DIAGNOSIS — N390 Urinary tract infection, site not specified: Secondary | ICD-10-CM

## 2022-02-03 LAB — POCT URINALYSIS DIP (CLINITEK)
Bilirubin, UA: NEGATIVE
Glucose, UA: NEGATIVE mg/dL
Ketones, POC UA: NEGATIVE mg/dL
Nitrite, UA: POSITIVE — AB
POC PROTEIN,UA: NEGATIVE
Spec Grav, UA: 1.025 (ref 1.010–1.025)
Urobilinogen, UA: 0.2 E.U./dL
pH, UA: 6.5 (ref 5.0–8.0)

## 2022-02-03 MED ORDER — SULFAMETHOXAZOLE-TRIMETHOPRIM 800-160 MG PO TABS
1.0000 | ORAL_TABLET | Freq: Two times a day (BID) | ORAL | 0 refills | Status: DC
  Filled 2022-02-03: qty 20, 10d supply, fill #0

## 2022-02-03 MED ORDER — FLUTICASONE PROPIONATE 50 MCG/ACT NA SUSP
2.0000 | Freq: Every day | NASAL | 6 refills | Status: DC
  Filled 2022-02-03: qty 16, 25d supply, fill #0

## 2022-02-03 MED ORDER — FLUCONAZOLE 150 MG PO TABS
150.0000 mg | ORAL_TABLET | Freq: Once | ORAL | 0 refills | Status: AC
  Filled 2022-02-03: qty 1, 1d supply, fill #0

## 2022-02-03 NOTE — Progress Notes (Signed)
Patient ID: Nancy Terry, female   DOB: [birth year], 46 y.o.   MRN: [id number]   Nancy Terry, is a 46 y.o. female  [id number]  [id number]  DOB - [birth year]  Chief Complaint  Patient presents with   painful urination    Headache   Anxiety       Subjective:   Nancy Terry is a 46 y.o. female here today for burning with urination for about 1 week.  No N/V/abdominal or flank pain.  Not SA in many years.  Admits to poor water intake  Also feeling fatigued but has been cutting back on nicotine and smoking.  Still on methadone.  During this time of titration on cigs and caffiene, she has also been having more frequent HA.  No thunderclapp.  HA frontal.  No vision changes  Ears feel congested and popping.  No ear pain.     No problems updated.  ALLERGIES: Allergies  Allergen Reactions   Haldol [Haloperidol Decanoate] Other (See Comments)    Hallucinations   Nitrates, Organic Other (See Comments)    headaches    PAST MEDICAL HISTORY: Past Medical History:  Diagnosis Date   Anxiety    Depression    Hepatitis C    Herpes infection    Hypertension    Kidney stones    Migraines    Narcotic abuse (HCC)     MEDICATIONS AT HOME: Prior to Admission medications   Medication Sig Start Date End Date Taking? Authorizing Provider  fluconazole (DIFLUCAN) 150 MG tablet Take 1 tablet (150 mg total) by mouth once for 1 dose. 02/03/22 02/04/22 Yes Mame Twombly, Marzella Schlein, PA-C  gabapentin (NEURONTIN) 300 MG capsule Take 1 capsule (300 mg total) by mouth at bedtime. 11/10/21  Yes Hoy Register, MD  hydrOXYzine (ATARAX/VISTARIL) 25 MG tablet Take 1 tablet (25 mg total) by mouth every 8 (eight) hours as needed for anxiety. 10/10/20  Yes Raspet, Erin K, PA-C  ibuprofen (ADVIL) 200 MG tablet Take 200 mg by mouth every 6 (six) hours as needed.    Yes [provider]  ondansetron (ZOFRAN) 4 MG tablet Take 1 tablet (4 mg total) by mouth every 8 (eight) hours as needed for nausea or vomiting.  04/17/21  Yes Jennette Kettle, RPH-CPP  silver sulfADIAZINE (SILVADENE) 1 % cream Apply 1 application topically daily. 05/02/20  Yes Eustace Moore, MD  sulfamethoxazole-trimethoprim (BACTRIM DS) 800-160 MG tablet Take 1 tablet by mouth 2 (two) times daily. 02/03/22  Yes Georgian Co M, PA-C  acetaminophen-codeine (TYLENOL #3) 300-30 MG tablet Take 1-2 tablets by mouth every 6 (six) hours as needed for moderate pain. Patient not taking: Reported on 10/08/2021 05/02/20   Eustace Moore, MD  clindamycin (CLEOCIN T) 1 % lotion Apply topically 2 (two) times daily. Patient not taking: Reported on 02/03/2022 11/18/20   Hoy Register, MD  furosemide (LASIX) 20 MG tablet Take 1 tablet (20 mg total) by mouth daily. Patient not taking: Reported on 10/08/2021 05/02/20   Eustace Moore, MD  methadone (DOLOPHINE) 10 MG/5ML solution Take 120 mg by mouth daily. Patient not taking: Reported on 02/03/2022    [provider]    ROS: Neg resp Neg cardiac Neg GI Neg GU Neg MS Neg psych Neg neuro  Objective:   Vitals:   02/03/22 1124  BP: (!) 174/81  Pulse: (!) 47  SpO2: 98%  Weight: 213 lb (96.6 kg)  Height: 5\' 3"  (1.6 m)   Exam General appearance : Awake, alert, not in  any distress. Speech Clear. Not toxic looking HEENT: Atraumatic and Normocephalic.  R TM Congested; L canal with cerumen obstruction I was able to eliminate with a cerumen scoop.  Patient tolerated.  TM congested Neck: Supple, no JVD. No cervical lymphadenopathy.  Chest: Good air entry bilaterally, CTAB.  No rales/rhonchi/wheezing CVS: S1 S2 regular, no murmurs.  No flank pain Extremities: B/L Lower Ext shows no edema, both legs are warm to touch Neurology: Awake alert, and oriented X 3, CN II-XII intact, Non focal Skin: No Rash  Data Review Lab Results  Component Value Date   HGBA1C 5.4 10/08/2021   HGBA1C 5.7 (H) 04/01/2021   HGBA1C 5.3 06/14/2014    Assessment & Plan   1. Painful urination See  #4.  Drink 80-100 ounces water daily; urinary hygiene reviewed - Cervicovaginal ancillary only - POCT URINALYSIS DIP (CLINITEK)  2. Vaginal discomfort See 1 &4  3. Frequent urination See #4  4. Urinary tract infection with hematuria, site unspecified - Urine Culture - sulfamethoxazole-trimethoprim (BACTRIM DS) 800-160 MG tablet; Take 1 tablet by mouth 2 (two) times daily.  Dispense: 20 tablet; Refill: 0 - fluconazole (DIFLUCAN) 150 MG tablet; Take 1 tablet (150 mg total) by mouth once for 1 dose.  Dispense: 1 tablet; Refill: 0  5. Impacted cerumen of left ear Resolved with scoop.  Flonase sent to help with HA and congestion  6. Rebound headache Likley due to titration downward of cigs and nicotine and inadequate hydration.  Discussed and how to manage symptoms.  Great work on cutting back.  No red flags  7. Fatigue, unspecified type - Thyroid Panel With TSH - Basic metabolic panel    Return in about 3 months (around 05/06/2022) for Dr Alvis Lemmings for chronic conditions.  The patient was given clear instructions to go to ER or return to medical center if symptoms don't improve, worsen or new problems develop. The patient verbalized understanding. The patient was told to call to get lab results if they haven't heard anything in the next week.      Georgian Co, PA-C [facility] [place], [place] [phone]   02/03/2022, 12:42 PM

## 2022-02-03 NOTE — Patient Instructions (Addendum)
Drink 80-100 ounces water daily 

## 2022-02-04 LAB — CERVICOVAGINAL ANCILLARY ONLY
Bacterial Vaginitis (gardnerella): POSITIVE — AB
Candida Glabrata: NEGATIVE
Candida Vaginitis: NEGATIVE
Chlamydia: NEGATIVE
Comment: NEGATIVE
Comment: NEGATIVE
Comment: NEGATIVE
Comment: NEGATIVE
Comment: NEGATIVE
Comment: NORMAL
Neisseria Gonorrhea: NEGATIVE
Trichomonas: NEGATIVE

## 2022-02-05 ENCOUNTER — Telehealth: Payer: Self-pay | Admitting: *Deleted

## 2022-02-05 LAB — URINE CULTURE

## 2022-02-05 NOTE — Telephone Encounter (Signed)
Pt called for lab results from 02/03/22. Results are in but MD has not made comments as to what to tell pt, please assess. Pt is expecting a call back with results.

## 2022-02-05 NOTE — Telephone Encounter (Signed)
Pt has been informed of lab results. 

## 2022-02-06 LAB — BASIC METABOLIC PANEL
BUN/Creatinine Ratio: 14 (ref 9–23)
BUN: 10 mg/dL (ref 6–24)
CO2: 23 mmol/L (ref 20–29)
Calcium: 9.1 mg/dL (ref 8.7–10.2)
Chloride: 103 mmol/L (ref 96–106)
Creatinine, Ser: 0.72 mg/dL (ref 0.57–1.00)
Glucose: 95 mg/dL (ref 70–99)
Potassium: 4.1 mmol/L (ref 3.5–5.2)
Sodium: 141 mmol/L (ref 134–144)
eGFR: 104 mL/min/{1.73_m2} (ref >59)

## 2022-02-06 LAB — THYROID PANEL WITH TSH
Free Thyroxine Index: 1.9 (ref 1.2–4.9)
T3 Uptake Ratio: 25 % (ref 24–39)
T4, Total: 7.7 ug/dL (ref 4.5–12.0)
TSH: 1.39 u[IU]/mL (ref 0.450–4.500)

## 2022-02-11 ENCOUNTER — Ambulatory Visit: Payer: Self-pay | Admitting: *Deleted

## 2022-02-11 NOTE — Telephone Encounter (Signed)
Pt would like to know when and if she should be getting a COVID booster.   Pt seeking clinical advice.   Pt has several questions regarding Covid booster, flu and pneumonia vaccines. Answered all questions to pt satisfaction.  Reason for Disposition  Health Information question, no triage required and triager able to answer question  Answer Assessment - Initial Assessment Questions 1. REASON FOR CALL or QUESTION: "What is your reason for calling today?" or "How can I best help you?" or "What question do you have that I can help answer?"     Vaccines  Protocols used: Information Only Call - No Triage-A-AH

## 2022-03-01 ENCOUNTER — Ambulatory Visit: Payer: Self-pay | Admitting: *Deleted

## 2022-03-01 NOTE — Telephone Encounter (Signed)
  Chief Complaint: Vaginal Bleeding Symptoms: Vaginal bleeding, at times with clots. Severity varies. Frequency: "For years" Pertinent Negatives: Patient denies dizziness Disposition: [] ED /[] Urgent Care (no appt availability in office) / [] Appointment(In office/virtual)/ []  [facility]/ [] Home Care/ [] Refused Recommended Disposition /[] [facility]/ [x]  Follow-up with PCP Additional Notes: Pt states she had seen OB/GYN for issues. States would like to see them again but was told she would need new referral as she has not been seen in a year. States if not able to be seen there, she would like to see Dr. Margarita Rana for issues, before December. States "Only Dr. Margarita Rana." Please advise.Advised ED for worsening symptoms. Reason for Disposition  [1] Bleeding or spotting between regular periods AND [2] occurs more than three cycles (3 months) this past year  Answer Assessment - Initial Assessment Questions 1. AMOUNT: "Describe the bleeding that you are having."    - SPOTTING: spotting, or pinkish / brownish mucous discharge; does not fill panty liner or pad    - MILD:  less than 1 pad / hour; less than patient's usual menstrual bleeding   - MODERATE: 1-2 pads / hour; 1 menstrual cup every 6 hours; small-medium blood clots (e.g., pea, grape, small coin)   - SEVERE: soaking 2 or more pads/hour for 2 or more hours; 1 menstrual cup every 2 hours; bleeding not contained by pads or continuous red blood from vagina; large blood clots (e.g., golf ball, large coin)      Varies, sometimes every 30 minutes 2. ONSET: "When did the bleeding begin?" "Is it continuing now?"     Years ago 3. MENSTRUAL PERIOD: "When was the last normal menstrual period?" "How is this different than your period?"     Varies 4. REGULARITY: "How regular are your periods?"     Not 5. ABDOMEN PAIN: "Do you have any pain?" "How bad is the pain?"  (e.g., Scale 1-10; mild, moderate, or severe)   - MILD (1-3): doesn't  interfere with normal activities, abdomen soft and not tender to touch    - MODERATE (4-7): interferes with normal activities or awakens from sleep, abdomen tender to touch    - SEVERE (8-10): excruciating pain, doubled over, unable to do any normal activities      BAck pain, cramping  7. BREASTFEEDING: "Are you breastfeeding?"     *No Answer* 8. HORMONE MEDICINES: "Are you taking any hormone medicines, prescription or over-the-counter?" (e.g., birth control pills, estrogen)      9. BLOOD THINNER MEDICINES: "Do you take any blood thinners?" (e.g., Coumadin / warfarin, Pradaxa / dabigatran, aspirin)     no 10. CAUSE: "What do you think is causing the bleeding?" (e.g., recent gyn surgery, recent gyn procedure; known bleeding disorder, cervical cancer, polycystic ovarian disease, fibroids)          11. HEMODYNAMIC STATUS: "Are you weak or feeling lightheaded?" If Yes, ask: "Can you stand and walk normally?"         12. OTHER SYMPTOMS: "What other symptoms are you having with the bleeding?" (e.g., passed tissue, vaginal discharge, fever, menstrual-type cramps) Clots at times  Protocols used: Vaginal Bleeding - Abnormal-A-AH

## 2022-03-02 ENCOUNTER — Other Ambulatory Visit: Payer: Self-pay

## 2022-03-03 ENCOUNTER — Telehealth: Payer: Self-pay | Admitting: Family Medicine

## 2022-03-03 NOTE — Telephone Encounter (Signed)
Pt was triaged on 9-25 and states she has not heard anything back from the nurse or provider regarding vaginal bleeding  Please fu w/ pt

## 2022-03-03 NOTE — Telephone Encounter (Signed)
Walkin apt scheduled on Monday, 03/08/2022 at 0830.

## 2022-03-03 NOTE — Telephone Encounter (Signed)
[facility] apt made for Monday October 2nd at 0830.

## 2022-03-05 NOTE — Telephone Encounter (Signed)
Call placed to patient and VM was left informing patient to return phone call and ask to speak with Najmo Pardue.

## 2022-03-05 NOTE — Telephone Encounter (Signed)
Patient following up on her CRM message left on 03/04/2022 requesting a call back. Patient states she has a dental appointment on 03/08/2022 and has been waiting  awhile for this appointment. Patient states she is available today or any day or time after Tuesday 03/09/2022. Patient would like a follow up call.

## 2022-03-05 NOTE — Telephone Encounter (Signed)
Pt returning call  Pt requesting a cb

## 2022-03-05 NOTE — Telephone Encounter (Signed)
Pt was called and placed on wait list for 10/3

## 2022-03-08 ENCOUNTER — Ambulatory Visit: Payer: No Typology Code available for payment source

## 2022-03-09 ENCOUNTER — Other Ambulatory Visit: Payer: Self-pay

## 2022-03-09 ENCOUNTER — Ambulatory Visit: Payer: Self-pay | Attending: Family Medicine | Admitting: Nurse Practitioner

## 2022-03-09 ENCOUNTER — Encounter: Payer: Self-pay | Admitting: Nurse Practitioner

## 2022-03-09 VITALS — BP 127/75 | HR 60 | Temp 97.9°F | Ht 63.0 in | Wt 210.4 lb

## 2022-03-09 DIAGNOSIS — N920 Excessive and frequent menstruation with regular cycle: Secondary | ICD-10-CM

## 2022-03-09 DIAGNOSIS — R110 Nausea: Secondary | ICD-10-CM

## 2022-03-09 DIAGNOSIS — G894 Chronic pain syndrome: Secondary | ICD-10-CM

## 2022-03-09 MED ORDER — GABAPENTIN 300 MG PO CAPS
300.0000 mg | ORAL_CAPSULE | Freq: Every day | ORAL | 0 refills | Status: DC
  Filled 2022-03-09: qty 90, 90d supply, fill #0

## 2022-03-09 MED ORDER — PROMETHAZINE HCL 25 MG PO TABS
25.0000 mg | ORAL_TABLET | Freq: Three times a day (TID) | ORAL | 0 refills | Status: DC | PRN
  Filled 2022-03-09: qty 20, 7d supply, fill #0

## 2022-03-09 NOTE — Patient Instructions (Addendum)
Placed in [place]  [place], [place] [address] [place]

## 2022-03-09 NOTE — Progress Notes (Signed)
Assessment & Plan:  Marilena was seen today for menstrual problem.  Diagnoses and all orders for this visit:  Menorrhagia with regular cycle -     CBC with Differential -     Iron, TIBC and Ferritin Panel  Nausea -     promethazine (PHENERGAN) 25 MG tablet; Take 1 tablet (25 mg total) by mouth every 8 (eight) hours as needed for nausea or vomiting.  Chronic pain syndrome -     gabapentin (NEURONTIN) 300 MG capsule; Take 1 capsule (300 mg total) by mouth at bedtime.    Patient has been counseled on age-appropriate routine health concerns for screening and prevention. These are reviewed and up-to-date. Referrals have been placed accordingly. Immunizations are up-to-date or declined.    Subjective:   Chief Complaint  Patient presents with   Menstrual Problem    Referral to obgyn to regulate period and to wound care due to leg sores.   HPI Mckinsley Koelzer 46 y.o. female presents to office today on the walk in scheduled requesting referral to Gynecology for irregular and heavy periods. She has a referral already that was placed in May so she was given the phone number to the GYN clinic to schedule.     She has nausea associated with migraines. Zofran makes her headaches worse. She is requesting refill of phenergan her PCP has prescribed for her in the past. Headaches knees and legs.    Requesting tylenol #3 for pain today and referral to wound care. I declined both of these requests based on exam of BLE and recommended she continue with gabapentin for now. She was initially prescribed this medication a few years ago for leg ulcers. See below for current status of BLE.        Review of Systems  Constitutional:  Negative for fever, malaise/fatigue and weight loss.  HENT: Negative.  Negative for nosebleeds.   Eyes: Negative.  Negative for blurred vision, double vision and photophobia.  Respiratory: Negative.  Negative for cough and shortness of breath.   Cardiovascular: Negative.   Negative for chest pain, palpitations and leg swelling.  Gastrointestinal:  Positive for nausea. Negative for heartburn and vomiting.  Genitourinary:        Menorrhagia  Musculoskeletal:  Negative for myalgias.       Chronic pain  Neurological:  Positive for headaches. Negative for dizziness, focal weakness and seizures.  Psychiatric/Behavioral: Negative.  Negative for suicidal ideas.     Past Medical History:  Diagnosis Date   Anxiety    Depression    Hepatitis C    Herpes infection    Hypertension    Kidney stones    Migraines    Narcotic abuse (HCC)     Past Surgical History:  Procedure Laterality Date   BREAST BIOPSY     ELBOW SURGERY Left    KIDNEY STONE SURGERY     with stent placement   LITHOTRIPSY     TUBAL LIGATION      Family History  Problem Relation Age of Onset   Parkinson's disease Mother    Hypertension Mother    Rheum arthritis Mother    Heart failure Mother    Parkinson's disease Maternal Grandmother    Hypertension Maternal Grandmother    Glaucoma Maternal Grandmother    Lung cancer Maternal Grandmother    Colon cancer Neg Hx    Stomach cancer Neg Hx    Pancreatic cancer Neg Hx    Esophageal cancer Neg Hx  Social History Reviewed with no changes to be made today.   Outpatient Medications Prior to Visit  Medication Sig Dispense Refill   acetaminophen-codeine (TYLENOL #3) 300-30 MG tablet Take 1-2 tablets by mouth every 6 (six) hours as needed for moderate pain. 15 tablet 0   clindamycin (CLEOCIN T) 1 % lotion Apply topically 2 (two) times daily. 60 mL 6   furosemide (LASIX) 20 MG tablet Take 1 tablet (20 mg total) by mouth daily. 15 tablet 0   hydrOXYzine (ATARAX/VISTARIL) 25 MG tablet Take 1 tablet (25 mg total) by mouth every 8 (eight) hours as needed for anxiety. 21 tablet 0   ibuprofen (ADVIL) 200 MG tablet Take 200 mg by mouth every 6 (six) hours as needed.      methadone (DOLOPHINE) 10 MG/5ML solution Take 120 mg by mouth daily.      silver sulfADIAZINE (SILVADENE) 1 % cream Apply 1 application topically daily. 50 g 0   gabapentin (NEURONTIN) 300 MG capsule Take 1 capsule (300 mg total) by mouth at bedtime. 90 capsule 0   fluticasone (FLONASE) 50 MCG/ACT nasal spray Place 2 sprays into both nostrils daily. (Patient not taking: Reported on 03/09/2022) 16 g 6   ondansetron (ZOFRAN) 4 MG tablet Take 1 tablet (4 mg total) by mouth every 8 (eight) hours as needed for nausea or vomiting. 20 tablet 2   sulfamethoxazole-trimethoprim (BACTRIM DS) 800-160 MG tablet Take 1 tablet by mouth 2 (two) times daily. (Patient not taking: Reported on 03/09/2022) 20 tablet 0   No facility-administered medications prior to visit.    Allergies  Allergen Reactions   Haldol [Haloperidol Decanoate] Other (See Comments)    Hallucinations   Nitrates, Organic Other (See Comments)    headaches       Objective:    BP 127/75   Pulse 60   Temp 97.9 F (36.6 C) (Oral)   Ht 5\' 3"  (1.6 m)   Wt 210 lb 6.4 oz (95.4 kg)   LMP 03/06/2022 (Exact Date)   SpO2 98%   BMI 37.27 kg/m  Wt Readings from Last 3 Encounters:  03/09/22 210 lb 6.4 oz (95.4 kg)  02/03/22 213 lb (96.6 kg)  10/08/21 210 lb 3.2 oz (95.3 kg)    Physical Exam Vitals and nursing note reviewed.  Constitutional:      Appearance: She is well-developed.  HENT:     Head: Normocephalic and atraumatic.  Cardiovascular:     Rate and Rhythm: Normal rate and regular rhythm.     Heart sounds: Normal heart sounds. No murmur heard.    No friction rub. No gallop.  Pulmonary:     Effort: Pulmonary effort is normal. No tachypnea or respiratory distress.     Breath sounds: Normal breath sounds. No decreased breath sounds, wheezing, rhonchi or rales.  Chest:     Chest wall: No tenderness.  Abdominal:     General: Bowel sounds are normal.     Palpations: Abdomen is soft.  Musculoskeletal:        General: Normal range of motion.     Cervical back: Normal range of motion.  Skin:     General: Skin is warm and dry.  Neurological:     Mental Status: She is alert and oriented to person, place, and time.     Coordination: Coordination normal.  Psychiatric:        Behavior: Behavior normal. Behavior is cooperative.        Thought Content: Thought content normal.  Judgment: Judgment normal.          Patient has been counseled extensively about nutrition and exercise as well as the importance of adherence with medications and regular follow-up. The patient was given clear instructions to go to ER or return to medical center if symptoms don't improve, worsen or new problems develop. The patient verbalized understanding.   Follow-up: Return for keep appt with newlin in December .   Gildardo Pounds, FNP-BC [facility] [place], [employer]   03/09/2022, 1:38 PM

## 2022-03-10 ENCOUNTER — Other Ambulatory Visit: Payer: Self-pay | Admitting: Nurse Practitioner

## 2022-03-10 ENCOUNTER — Other Ambulatory Visit: Payer: Self-pay

## 2022-03-10 DIAGNOSIS — D509 Iron deficiency anemia, unspecified: Secondary | ICD-10-CM

## 2022-03-10 DIAGNOSIS — Z1211 Encounter for screening for malignant neoplasm of colon: Secondary | ICD-10-CM

## 2022-03-10 LAB — IRON,TIBC AND FERRITIN PANEL
Ferritin: 7 ng/mL — ABNORMAL LOW (ref 15–150)
Iron Saturation: 4 % — CL (ref 15–55)
Iron: 15 ug/dL — ABNORMAL LOW (ref 27–159)
Total Iron Binding Capacity: 422 ug/dL (ref 250–450)
UIBC: 407 ug/dL (ref 131–425)

## 2022-03-10 LAB — CBC WITH DIFFERENTIAL/PLATELET
Basophils Absolute: 0.1 10*3/uL (ref 0.0–0.2)
Basos: 1 %
EOS (ABSOLUTE): 0.2 10*3/uL (ref 0.0–0.4)
Eos: 3 %
Hematocrit: 33.9 % — ABNORMAL LOW (ref 34.0–46.6)
Hemoglobin: 9.4 g/dL — ABNORMAL LOW (ref 11.1–15.9)
Immature Grans (Abs): 0 10*3/uL (ref 0.0–0.1)
Immature Granulocytes: 0 %
Lymphocytes Absolute: 2.3 10*3/uL (ref 0.7–3.1)
Lymphs: 32 %
MCH: 19.1 pg — ABNORMAL LOW (ref 26.6–33.0)
MCHC: 27.7 g/dL — ABNORMAL LOW (ref 31.5–35.7)
MCV: 69 fL — ABNORMAL LOW (ref 79–97)
Monocytes Absolute: 0.6 10*3/uL (ref 0.1–0.9)
Monocytes: 9 %
Neutrophils Absolute: 3.9 10*3/uL (ref 1.4–7.0)
Neutrophils: 55 %
Platelets: 458 10*3/uL — ABNORMAL HIGH (ref 150–450)
RBC: 4.92 x10E6/uL (ref 3.77–5.28)
RDW: 17.8 % — ABNORMAL HIGH (ref 11.7–15.4)
WBC: 7.1 10*3/uL (ref 3.4–10.8)

## 2022-03-10 MED ORDER — FERROUS SULFATE 325 (65 FE) MG PO TBEC
325.0000 mg | DELAYED_RELEASE_TABLET | Freq: Every day | ORAL | 3 refills | Status: DC
  Filled 2022-03-10: qty 90, 90d supply, fill #0

## 2022-03-11 ENCOUNTER — Ambulatory Visit: Payer: Self-pay | Attending: Family Medicine

## 2022-03-11 ENCOUNTER — Other Ambulatory Visit: Payer: Self-pay

## 2022-03-11 DIAGNOSIS — Z23 Encounter for immunization: Secondary | ICD-10-CM

## 2022-05-11 ENCOUNTER — Ambulatory Visit: Payer: No Typology Code available for payment source | Admitting: Family Medicine

## 2022-05-13 ENCOUNTER — Ambulatory Visit: Payer: No Typology Code available for payment source | Admitting: Internal Medicine

## 2022-05-17 ENCOUNTER — Ambulatory Visit: Payer: No Typology Code available for payment source | Admitting: Obstetrics and Gynecology

## 2022-05-17 NOTE — Progress Notes (Deleted)
NEW GYNECOLOGY PATIENT Patient name: Nancy Terry MRN [id number]  Date of birth: 10/20/1975 Chief Complaint:   No chief complaint on file.     History:  Aldena Worm is a 46 y.o. G3P3000 being seen today for ***.    HPI   ROS      Gynecologic History No LMP recorded. Contraception: {method:5051} Last Pap: 03/2021. Result was normal with negative HPV Last Mammogram: ***.  Result was {norm/abn:16337} Last Colonoscopy: ***.  Result was {norm/abn:16337}  Obstetric History OB History  Gravida Para Term Preterm AB Living  3 3 3  0 0    SAB IAB Ectopic Multiple Live Births  0 0 0        # Outcome Date GA Lbr Len/2nd Weight Sex Delivery Anes PTL Lv  3 Term      Vag-Spont     2 Term      Vag-Spont     1 Term      Vag-Spont       Past Medical History:  Diagnosis Date   Anxiety    Depression    Hepatitis C    Herpes infection    Hypertension    Kidney stones    Migraines    Narcotic abuse (HCC)     Past Surgical History:  Procedure Laterality Date   BREAST BIOPSY     ELBOW SURGERY Left    KIDNEY STONE SURGERY     with stent placement   LITHOTRIPSY     TUBAL LIGATION      Current Outpatient Medications on File Prior to Visit  Medication Sig Dispense Refill   acetaminophen-codeine (TYLENOL #3) 300-30 MG tablet Take 1-2 tablets by mouth every 6 (six) hours as needed for moderate pain. 15 tablet 0   clindamycin (CLEOCIN T) 1 % lotion Apply topically 2 (two) times daily. 60 mL 6   ferrous sulfate 325 (65 FE) MG EC tablet Take 1 tablet (325 mg total) by mouth daily with breakfast. 90 tablet 3   fluticasone (FLONASE) 50 MCG/ACT nasal spray Place 2 sprays into both nostrils daily. (Patient not taking: Reported on 03/09/2022) 16 g 6   furosemide (LASIX) 20 MG tablet Take 1 tablet (20 mg total) by mouth daily. 15 tablet 0   gabapentin (NEURONTIN) 300 MG capsule Take 1 capsule (300 mg total) by mouth at bedtime. 90 capsule 0   hydrOXYzine (ATARAX/VISTARIL) 25 MG tablet Take  1 tablet (25 mg total) by mouth every 8 (eight) hours as needed for anxiety. 21 tablet 0   ibuprofen (ADVIL) 200 MG tablet Take 200 mg by mouth every 6 (six) hours as needed.      methadone (DOLOPHINE) 10 MG/5ML solution Take 120 mg by mouth daily.     promethazine (PHENERGAN) 25 MG tablet Take 1 tablet (25 mg total) by mouth every 8 (eight) hours as needed for nausea or vomiting. 20 tablet 0   silver sulfADIAZINE (SILVADENE) 1 % cream Apply 1 application topically daily. 50 g 0   No current facility-administered medications on file prior to visit.    Allergies  Allergen Reactions   Haldol [Haloperidol Decanoate] Other (See Comments)    Hallucinations   Nitrates, Organic Other (See Comments)    headaches    Social History:  reports that she has been smoking cigarettes. She has a 0.50 pack-year smoking history. She has never used smokeless tobacco. She reports that she does not currently use drugs after having used the following drugs: Heroin and Marijuana. She reports  that she does not drink alcohol.  Family History  Problem Relation Age of Onset   Parkinson's disease Mother    Hypertension Mother    Rheum arthritis Mother    Heart failure Mother    Parkinson's disease Maternal Grandmother    Hypertension Maternal Grandmother    Glaucoma Maternal Grandmother    Lung cancer Maternal Grandmother    Colon cancer Neg Hx    Stomach cancer Neg Hx    Pancreatic cancer Neg Hx    Esophageal cancer Neg Hx     The following portions of the patient's history were reviewed and updated as appropriate: allergies, current medications, past family history, past medical history, past social history, past surgical history and problem list.  Review of Systems Pertinent items noted in HPI and remainder of comprehensive ROS otherwise negative.  Physical Exam:  There were no vitals taken for this visit. Physical Exam     Assessment and Plan:   There are no diagnoses linked to this  encounter.   Routine preventative health maintenance measures emphasized. Please refer to After Visit Summary for other counseling recommendations.   Follow-up: No follow-ups on file.      Lorriane Shire, MD Obstetrician & Gynecologist, Faculty Practice [facility] for [employer], [facility]

## 2022-05-25 ENCOUNTER — Other Ambulatory Visit: Payer: Self-pay

## 2022-06-17 ENCOUNTER — Other Ambulatory Visit (HOSPITAL_COMMUNITY)
Admission: RE | Admit: 2022-06-17 | Discharge: 2022-06-17 | Disposition: A | Discharge status: Home / Self Care | Payer: Medicaid Other | Source: Ambulatory Visit | Attending: Obstetrics and Gynecology | Admitting: Obstetrics and Gynecology

## 2022-06-17 ENCOUNTER — Encounter: Payer: Self-pay | Admitting: Obstetrics and Gynecology

## 2022-06-17 ENCOUNTER — Ambulatory Visit (INDEPENDENT_AMBULATORY_CARE_PROVIDER_SITE_OTHER): Payer: Medicaid Other | Admitting: Obstetrics and Gynecology

## 2022-06-17 VITALS — BP 122/74 | HR 68 | Ht 64.0 in | Wt 219.0 lb

## 2022-06-17 DIAGNOSIS — N898 Other specified noninflammatory disorders of vagina: Secondary | ICD-10-CM | POA: Diagnosis not present

## 2022-06-17 DIAGNOSIS — N920 Excessive and frequent menstruation with regular cycle: Secondary | ICD-10-CM | POA: Diagnosis not present

## 2022-06-17 NOTE — Progress Notes (Signed)
    GYNECOLOGY VISIT  Patient name: Nancy Terry MRN [id number]  Date of birth: 01-13-1976 Chief Complaint:   Menorrhagia   History:  Nancy Terry is a 47 y.o. G3P3000 being seen today for AUB. Wants to know what stage of menopause she is in.   Wants to know if thyroid will be checked.   Itching, burning and urinary urgency and wondering if she has a UTI. Interested in [place] for  Having bleeding x3 years and periods last 7d - 3weeks, bleeding for most of the month, very heavy and can't get out of the bed. Changing clothes 3-5x a day and 2packs of pads a day with pad causing vulvar irritation. Pamprin or midol for pain (feels increased bleeding when on ibuprofen). Desires a hysterectomy o rother procedure. Not able   No breast or nipple changes. Last thyroid panel normal. Not currently sexually active.   Currently taking gabapentin and methadone for daily pain (denies hx of drug use; notes not taking pain pills in several years)   Not currently taking the iron pills. Always discreet pads, thin layers   Past Medical History:  Diagnosis Date   Anxiety    Depression    Hepatitis C    Herpes infection    Hypertension    Kidney stones    Migraines    Narcotic abuse ([place])     Past Surgical History:  Procedure Laterality Date   BREAST BIOPSY     ELBOW SURGERY Left    KIDNEY STONE SURGERY     with stent placement   LITHOTRIPSY     TUBAL LIGATION      The following portions of the patient's history were reviewed and updated as appropriate: allergies, current medications, past family history, past medical history, past social history, past surgical history and problem list.   Health Maintenance:   Last pap 03/2021. Results were: NILM w/ HRHPV negative.  Last mammogram: 12/2021. Results were: normal.    Review of Systems:  Pertinent items are noted in HPI. Comprehensive review of systems was otherwise negative.   Objective:  Physical Exam BP 122/74   Pulse 68   Ht 5\' 4"   (1.626 m)   Wt 219 lb (99.3 kg)   LMP 06/03/2022 (Approximate)   BMI 37.59 kg/m    Physical Exam Vitals and nursing note reviewed.  Constitutional:      Appearance: Normal appearance.  HENT:     Head: Normocephalic and atraumatic.  Pulmonary:     Effort: Pulmonary effort is normal.  Skin:    General: Skin is warm and dry.  Neurological:     General: No focal deficit present.     Mental Status: She is alert.  Psychiatric:        Mood and Affect: Mood normal.        Behavior: Behavior normal.        Thought Content: Thought content normal.        Judgment: Judgment normal.    Labs and Imaging No results found.     Assessment & Plan:   1. Menorrhagia with regular cycle Repeat pelvic US Recommend EMB - declines today and would prefer at follow up visit Considering ablation or medication for management of bleeding - US PELVIC COMPLETE WITH TRANSVAGINAL; Future  2. Vaginal irritation Swabs today  - Cervicovaginal ancillary only    Routine preventative health maintenance measures emphasized.  Darliss Cheney, MD [facility] [place]

## 2022-06-17 NOTE — Progress Notes (Signed)
[place] scheduled for January 29th @ 1230.  Pt will be notified via [place].    Frances Nickels  06/17/22

## 2022-06-17 NOTE — Patient Instructions (Signed)
Blood builder  - iron pills to increase your blood count  We will do an endometrial biopsy at your next visit.   Ultrasound to look at your uterus again

## 2022-06-18 ENCOUNTER — Telehealth: Payer: Self-pay | Admitting: Family Medicine

## 2022-06-18 LAB — CERVICOVAGINAL ANCILLARY ONLY
Bacterial Vaginitis (gardnerella): POSITIVE — AB
Candida Glabrata: POSITIVE — AB
Candida Vaginitis: NEGATIVE
Comment: NEGATIVE
Comment: NEGATIVE
Comment: NEGATIVE

## 2022-06-18 NOTE — Telephone Encounter (Signed)
Called patient stating I am returning her phone call. Patient states she is unsure how she will receive her swab and urine test results and also wanted to know if she needed bloodwork. Discussed with patient that per Dr Orbie Pyo note, she did not need blood work. Also discussed with patient that unfortunately her urine was not sent yesterday but she was welcome to come by the office on Monday to drop off a sample. Patient verbalized understanding.

## 2022-06-18 NOTE — Telephone Encounter (Signed)
Patient called in stating she had a few follow up questions from her appointment on 1/11.

## 2022-06-23 ENCOUNTER — Other Ambulatory Visit: Payer: Self-pay | Admitting: Obstetrics and Gynecology

## 2022-06-23 DIAGNOSIS — B379 Candidiasis, unspecified: Secondary | ICD-10-CM

## 2022-06-23 MED ORDER — BORIC ACID CRYS: 600.0000 mg | CRYSTALS | Freq: Every day | 2 refills | Status: AC

## 2022-06-28 ENCOUNTER — Telehealth: Payer: Self-pay | Admitting: Family Medicine

## 2022-06-28 NOTE — Telephone Encounter (Signed)
Patient called in because she did not receive her self swab results and her medication was sent to the wrong pharmacy.

## 2022-06-30 ENCOUNTER — Ambulatory Visit: Payer: No Typology Code available for payment source | Admitting: Internal Medicine

## 2022-07-01 NOTE — Telephone Encounter (Signed)
Called pt and a man answered. He stated that she is not @ home but will be back in 2-3 hours. I stated that I will call back.

## 2022-07-02 ENCOUNTER — Telehealth: Payer: Self-pay | Admitting: Family Medicine

## 2022-07-02 NOTE — Telephone Encounter (Signed)
Returned patient phone call inquiring about medication and u/s questions. I informed patient on date, time, and place for [place] appointment on Monday (01/29). Pt voiced understanding. Pt also had questions about the vaginal suppositories sent to [facility]. I explained to her that they were unable to be sent to regular pharmacy([facility] d/t they are not compounding pharmacies. Pt verbalized understanding and denied further questions.

## 2022-07-02 NOTE — Telephone Encounter (Signed)
Patient would like a call back about medications and u/s questions.

## 2022-07-05 ENCOUNTER — Ambulatory Visit
Admission: RE | Admit: 2022-07-05 | Discharge: 2022-07-05 | Disposition: A | Discharge status: Home / Self Care | Payer: Medicaid Other | Source: Ambulatory Visit | Attending: Obstetrics and Gynecology | Admitting: Obstetrics and Gynecology

## 2022-07-05 DIAGNOSIS — N920 Excessive and frequent menstruation with regular cycle: Secondary | ICD-10-CM | POA: Diagnosis present

## 2022-07-08 ENCOUNTER — Telehealth: Payer: Self-pay | Admitting: Family Medicine

## 2022-07-09 ENCOUNTER — Telehealth: Payer: Self-pay | Admitting: Family Medicine

## 2022-07-09 NOTE — Telephone Encounter (Signed)
Has questions about her results

## 2022-07-13 ENCOUNTER — Telehealth: Payer: Self-pay | Admitting: Family Medicine

## 2022-07-13 NOTE — Telephone Encounter (Signed)
Patient called requesting results from [place] completed 01/29. RN confirmed that Dr. Currie Paris would go over results from [place] with patient at appointment on 02/08. Pt verbalized understanding and denied further questions.

## 2022-07-13 NOTE — Telephone Encounter (Signed)
Patient called in wanting to get more information on her test results and to see if she can get her EMB done on her menstrual.

## 2022-07-15 ENCOUNTER — Other Ambulatory Visit (HOSPITAL_COMMUNITY)
Admission: RE | Admit: 2022-07-15 | Discharge: 2022-07-15 | Disposition: A | Discharge status: Home / Self Care | Payer: Medicaid Other | Source: Ambulatory Visit | Attending: Obstetrics and Gynecology | Admitting: Obstetrics and Gynecology

## 2022-07-15 ENCOUNTER — Ambulatory Visit: Payer: Medicaid Other | Admitting: Obstetrics and Gynecology

## 2022-07-15 ENCOUNTER — Encounter: Payer: Self-pay | Admitting: Obstetrics and Gynecology

## 2022-07-15 ENCOUNTER — Other Ambulatory Visit: Payer: Self-pay

## 2022-07-15 VITALS — BP 145/64 | HR 66 | Wt 222.9 lb

## 2022-07-15 DIAGNOSIS — N920 Excessive and frequent menstruation with regular cycle: Secondary | ICD-10-CM

## 2022-07-15 LAB — POCT PREGNANCY, URINE: Preg Test, Ur: NEGATIVE

## 2022-07-15 MED ORDER — MEGESTROL ACETATE 40 MG PO TABS
40.0000 mg | ORAL_TABLET | Freq: Two times a day (BID) | ORAL | 3 refills | Status: DC
  Filled 2022-07-15: qty 90, 30d supply, fill #0

## 2022-07-15 MED ORDER — IBUPROFEN 800 MG PO TABS: 800.0000 mg | ORAL_TABLET | Freq: Once | ORAL | Status: DC

## 2022-07-15 NOTE — Progress Notes (Signed)
NEW GYNECOLOGY PATIENT Patient name: Nancy Terry MRN [id number]  Date of birth: 07-28-1975 Chief Complaint:   No chief complaint on file.     History:  Nancy Terry is a 47 y.o. G3P3000 being seen today for AUB and scheduled EMB.    Has continued to have abnormal bleeding. She is interested in possible ablation.     Obstetric History OB History  Gravida Para Term Preterm AB Living  3 3 3 $ 0 0    SAB IAB Ectopic Multiple Live Births  0 0 0        # Outcome Date GA Lbr Len/2nd Weight Sex Delivery Anes PTL Lv  3 Term      Vag-Spont     2 Term      Vag-Spont     1 Term      Vag-Spont       Past Medical History:  Diagnosis Date   Anxiety    Depression    Hepatitis C    Herpes infection    Hypertension    Kidney stones    Migraines    Narcotic abuse ([place])     Past Surgical History:  Procedure Laterality Date   BREAST BIOPSY     ELBOW SURGERY Left    KIDNEY STONE SURGERY     with stent placement   LITHOTRIPSY     TUBAL LIGATION      Current Outpatient Medications on File Prior to Visit  Medication Sig Dispense Refill   acetaminophen-codeine (TYLENOL #3) 300-30 MG tablet Take 1-2 tablets by mouth every 6 (six) hours as needed for moderate pain. (Patient not taking: Reported on 06/17/2022) 15 tablet 0   ALPRAZolam (XANAX) 0.25 MG tablet Take 0.25 mg by mouth 2 (two) times daily.     clindamycin (CLEOCIN T) 1 % lotion Apply topically 2 (two) times daily. (Patient not taking: Reported on 06/17/2022) 60 mL 6   ferrous sulfate 325 (65 FE) MG EC tablet Take 1 tablet (325 mg total) by mouth daily with breakfast. (Patient not taking: Reported on 06/17/2022) 90 tablet 3   fluticasone (FLONASE) 50 MCG/ACT nasal spray Place 2 sprays into both nostrils daily. (Patient not taking: Reported on 03/09/2022) 16 g 6   furosemide (LASIX) 20 MG tablet Take 1 tablet (20 mg total) by mouth daily. (Patient not taking: Reported on 06/17/2022) 15 tablet 0   gabapentin (NEURONTIN) 300 MG capsule  Take 1 capsule (300 mg total) by mouth at bedtime. (Patient not taking: Reported on 06/17/2022) 90 capsule 0   hydrOXYzine (ATARAX/VISTARIL) 25 MG tablet Take 1 tablet (25 mg total) by mouth every 8 (eight) hours as needed for anxiety. (Patient not taking: Reported on 06/17/2022) 21 tablet 0   ibuprofen (ADVIL) 200 MG tablet Take 200 mg by mouth every 6 (six) hours as needed.      methadone (DOLOPHINE) 10 MG/5ML solution Take 120 mg by mouth daily.     promethazine (PHENERGAN) 25 MG tablet Take 1 tablet (25 mg total) by mouth every 8 (eight) hours as needed for nausea or vomiting. (Patient not taking: Reported on 06/17/2022) 20 tablet 0   silver sulfADIAZINE (SILVADENE) 1 % cream Apply 1 application topically daily. (Patient not taking: Reported on 06/17/2022) 50 g 0   No current facility-administered medications on file prior to visit.    Allergies  Allergen Reactions   Haldol [Haloperidol Decanoate] Other (See Comments)    Hallucinations   Nitrates, Organic Other (See Comments)    headaches  Social History:  reports that she has been smoking cigarettes. She has a 0.50 pack-year smoking history. She has never used smokeless tobacco. She reports that she does not currently use drugs after having used the following drugs: Heroin and Marijuana. She reports that she does not drink alcohol.  Family History  Problem Relation Age of Onset   Parkinson's disease Mother    Hypertension Mother    Rheum arthritis Mother    Heart failure Mother    Parkinson's disease Maternal Grandmother    Hypertension Maternal Grandmother    Glaucoma Maternal Grandmother    Lung cancer Maternal Grandmother    Colon cancer Neg Hx    Stomach cancer Neg Hx    Pancreatic cancer Neg Hx    Esophageal cancer Neg Hx     The following portions of the patient's history were reviewed and updated as appropriate: allergies, current medications, past family history, past medical history, past social history, past surgical  history and problem list.  Review of Systems Pertinent items noted in HPI and remainder of comprehensive ROS otherwise negative.  Physical Exam:  BP (!) 145/64   Pulse 66   Wt 222 lb 14.4 oz (101.1 kg)   LMP 07/04/2022 (Exact Date)   BMI 38.26 kg/m  Physical Exam Vitals and nursing note reviewed. Exam conducted with a chaperone present.  Constitutional:      Appearance: Normal appearance.  Cardiovascular:     Rate and Rhythm: Normal rate.  Pulmonary:     Effort: Pulmonary effort is normal.     Breath sounds: Normal breath sounds.  Genitourinary:    General: Normal vulva.     Vagina: Normal.     Cervix: Normal.  Neurological:     General: No focal deficit present.     Mental Status: She is alert and oriented to person, place, and time.  Psychiatric:        Mood and Affect: Mood normal.        Behavior: Behavior normal.        Thought Content: Thought content normal.        Judgment: Judgment normal.     Endometrial Biopsy Procedure  Patient identified, informed consent performed,  indication reviewed, consent signed.  Reviewed risk of perforation, pain, bleeding, insufficient sample, etc were reviewd. Time out was performed.  Urine pregnancy test negative.  Speculum placed in the vagina.  Cervix visualized.  Cleaned with Betadine x 2.  Anterior cervix infiltrated with 0.5cc of 2% lidocaine with epinephrine. Grasped anteriorly with a single tooth tenaculum.  Paracervical block was not administered and the endocervical canal instilled with using the needle.  Endometrial pipelle was used to draw up 1cc of 2% lidocaine with epinephrine, introduced into the cervical os and instilled into the endometrial cavity.  The pipelle was passed twice without difficulty and sample obtained. Tenaculum was removed, good hemostasis noted.  Patient tolerated procedure well.  Patient was given post-procedure instructions.    Assessment and Plan:   1. Menorrhagia with regular cycle Trial of  megace for bleeding management at this time. Will follow up surgical pathology. IBH referral for positive depression screen. If improvement with megace, will continue, if fail and EMB negative, likely ablation.  - megestrol (MEGACE) 40 MG tablet; Take one tablet three times a day for 3 days, then one tablet twice daily for 3 days, then one tablet daily  Dispense: 90 tablet; Refill: 3 - Surgical pathology - Amb ref to [place]   Routine  preventative health maintenance measures emphasized. Please refer to After Visit Summary for other counseling recommendations.   Follow-up: Return for GYN Follow Up.      Darliss Cheney, MD Obstetrician & Gynecologist, Faculty Practice [facility] for [employer], [place]

## 2022-07-16 ENCOUNTER — Other Ambulatory Visit (HOSPITAL_BASED_OUTPATIENT_CLINIC_OR_DEPARTMENT_OTHER): Payer: Self-pay

## 2022-07-16 ENCOUNTER — Other Ambulatory Visit: Payer: Self-pay

## 2022-07-18 ENCOUNTER — Emergency Department (HOSPITAL_BASED_OUTPATIENT_CLINIC_OR_DEPARTMENT_OTHER)
Admission: EM | Admit: 2022-07-18 | Discharge: 2022-07-18 | Disposition: A | Discharge status: Home / Self Care | Payer: Medicaid Other | Attending: Emergency Medicine | Admitting: Emergency Medicine

## 2022-07-18 ENCOUNTER — Other Ambulatory Visit: Payer: Self-pay

## 2022-07-18 ENCOUNTER — Encounter (HOSPITAL_BASED_OUTPATIENT_CLINIC_OR_DEPARTMENT_OTHER): Payer: Self-pay | Admitting: Emergency Medicine

## 2022-07-18 DIAGNOSIS — K0889 Other specified disorders of teeth and supporting structures: Secondary | ICD-10-CM | POA: Diagnosis present

## 2022-07-18 DIAGNOSIS — K029 Dental caries, unspecified: Secondary | ICD-10-CM | POA: Diagnosis not present

## 2022-07-18 MED ORDER — OXYCODONE-ACETAMINOPHEN 5-325 MG PO TABS
1.0000 | ORAL_TABLET | Freq: Once | ORAL | Status: AC
  Administered 2022-07-18: 1 via ORAL
  Filled 2022-07-18: qty 1

## 2022-07-18 MED ORDER — PENICILLIN V POTASSIUM 500 MG PO TABS: 500.0000 mg | ORAL_TABLET | Freq: Four times a day (QID) | ORAL | 0 refills | Status: AC

## 2022-07-18 NOTE — ED Triage Notes (Signed)
Pt arrives pov, steady gait with c/o RT lower dental pain with swelling and abscess this weekend., Denies drainage

## 2022-07-18 NOTE — Discharge Instructions (Signed)
You were seen in the emergency department for dental pain.  As we discussed I think your pain is likely related to a cavity. We normally treat this with anti-inflammatories and antibiotics.   I've attached a resource guide with several dentists in the area. It's incredibly important you follow up with a dentist as soon as possible for definitive treatment.   Continue to monitor how you're doing and return to the ER for new or worsening symptoms such as difficulty swallowing your own saliva, difficulty breathing, or fever.

## 2022-07-18 NOTE — ED Provider Notes (Signed)
[place] EMERGENCY DEPARTMENT AT [facility] Provider Note   CSN: [id number] Arrival date & time: 07/18/22  [id number]     History  Chief Complaint  Patient presents with   Dental Pain    Nancy Terry is a 47 y.o. female.  Patient with noncontributory past medical history presents today with complaints of right lower dental pain.  She states that same has been progressively worsening over the last few days.  She states that she has a scheduled for lower jaw reconstruction on 2/17 with a dentist and has had most of her lower teeth previously extracted. However states that one of her remaining teeth on the right lower side cracked and has been causing persistent pain. She is able to eat and drink and swallow without difficulty. Denies any facial swelling. No fevers or chills.  The history is provided by the patient. No language interpreter was used.  Dental Pain      Home Medications Prior to Admission medications   Medication Sig Start Date End Date Taking? Authorizing Provider  acetaminophen-codeine (TYLENOL #3) 300-30 MG tablet Take 1-2 tablets by mouth every 6 (six) hours as needed for moderate pain. Patient not taking: Reported on 06/17/2022 05/02/20   Raylene Everts, MD  ALPRAZolam Duanne Moron) 0.25 MG tablet Take 0.25 mg by mouth 2 (two) times daily. 06/14/22   [provider]  clindamycin (CLEOCIN T) 1 % lotion Apply topically 2 (two) times daily. Patient not taking: Reported on 06/17/2022 11/18/20   Charlott Rakes, MD  ferrous sulfate 325 (65 FE) MG EC tablet Take 1 tablet (325 mg total) by mouth daily with breakfast. Patient not taking: Reported on 06/17/2022 03/10/22   Gildardo Pounds, NP  fluticasone [facility]) 50 MCG/ACT nasal spray Place 2 sprays into both nostrils daily. Patient not taking: Reported on 03/09/2022 02/03/22   Argentina Donovan, PA-C  furosemide (LASIX) 20 MG tablet Take 1 tablet (20 mg total) by mouth daily. Patient not taking: Reported on  06/17/2022 05/02/20   Raylene Everts, MD  gabapentin (NEURONTIN) 300 MG capsule Take 1 capsule (300 mg total) by mouth at bedtime. Patient not taking: Reported on 06/17/2022 03/09/22   Gildardo Pounds, NP  hydrOXYzine (ATARAX/VISTARIL) 25 MG tablet Take 1 tablet (25 mg total) by mouth every 8 (eight) hours as needed for anxiety. Patient not taking: Reported on 06/17/2022 10/10/20   Raspet, Derry Skill, PA-C  ibuprofen (ADVIL) 200 MG tablet Take 200 mg by mouth every 6 (six) hours as needed.     [provider]  megestrol (MEGACE) 40 MG tablet Take one tablet three times a day for 3 days, then one tablet twice daily for 3 days, then one tablet daily 07/15/22   Darliss Cheney, MD  methadone (DOLOPHINE) 10 MG/5ML solution Take 120 mg by mouth daily.    [provider]  promethazine (PHENERGAN) 25 MG tablet Take 1 tablet (25 mg total) by mouth every 8 (eight) hours as needed for nausea or vomiting. Patient not taking: Reported on 06/17/2022 03/09/22   Gildardo Pounds, NP  silver sulfADIAZINE (SILVADENE) 1 % cream Apply 1 application topically daily. Patient not taking: Reported on 06/17/2022 05/02/20   Raylene Everts, MD      Allergies    Haldol [haloperidol decanoate] and Nitrates, organic    Review of Systems   Review of Systems  HENT:  Positive for dental problem.   All other systems reviewed and are negative.   Physical Exam Updated Vital  Signs BP (!) 162/64   Pulse (!) 55   Temp 98.5 F (36.9 C) (Oral)   Resp 20   Wt 99.3 kg   LMP 07/04/2022 (Exact Date)   SpO2 96%   BMI 37.59 kg/m  Physical Exam Vitals and nursing note reviewed.  Constitutional:      General: She is not in acute distress.    Appearance: Normal appearance. She is normal weight. She is not ill-appearing, toxic-appearing or diaphoretic.  HENT:     Head: Normocephalic and atraumatic.     Mouth/Throat:     Mouth: Mucous membranes are moist.     Pharynx: Oropharynx is clear. Uvula midline.       Comments: Dentition poor throughout with several obvious dental caries.  Tenderness to palpation of right lower canine.  No gross abscess or swelling of the tongue or signs of Ludwig's angina.  Patient is tolerating secretions. Cardiovascular:     Rate and Rhythm: Normal rate.  Pulmonary:     Effort: Pulmonary effort is normal. No respiratory distress.  Musculoskeletal:        General: Normal range of motion.     Cervical back: Normal range of motion.  Skin:    General: Skin is warm and dry.  Neurological:     General: No focal deficit present.     Mental Status: She is alert.  Psychiatric:        Mood and Affect: Mood normal.        Behavior: Behavior normal.     ED Results / Procedures / Treatments   Labs (all labs ordered are listed, but only abnormal results are displayed) Labs Reviewed - No data to display  EKG None  Radiology No results found.  Procedures Procedures    Medications Ordered in ED Medications  oxyCODONE-acetaminophen (PERCOCET/ROXICET) 5-325 MG per tablet 1 tablet (has no administration in time range)    ED Course/ Medical Decision Making/ A&P                             Medical Decision Making Risk Prescription drug management.   Patient presents today with complaints of right lower dental pain.  She is afebrile, nontoxic-appearing, in no acute distress with reassuring vital signs.  Physical exam reveals obvious dental carry to the right lower canine with no gross abscess.  Exam unconcerning for Ludwig's angina or spread of infection.  Given Percocet in the ED with improvement.  Will treat with penicillin and anti-inflammatories medicine.  Urged patient to follow-up with dentist.  Patient is understanding and amenable with plan, educated on red flag symptoms that would prompt immediate return.  Patient discharged in stable condition.   Final Clinical Impression(s) / ED Diagnoses Final diagnoses:  Pain, dental    Rx / DC Orders ED  Discharge Orders          Ordered    penicillin v potassium (VEETID) 500 MG tablet  4 times daily        07/18/22 [id number]          An After Visit Summary was printed and given to the patient.     Nestor Lewandowsky 07/18/22 [id number]    [name] Dusky, MD 07/18/22 1026

## 2022-07-19 LAB — SURGICAL PATHOLOGY

## 2022-07-23 NOTE — BH Specialist Note (Signed)
Integrated Behavioral Health via Telemedicine Visit  08/03/2022 Layleen Weary [id number]  Number of [place] Clinician visits: 1- Initial Visit  Session Start time: [id number]   Session End time: [id number]  Total time in minutes: 51   Referring Provider: Darliss Cheney, MD Patient/Family location: Home [facility] Provider location: Center for [place] [facility]  All persons participating in visit: Patient Nancy Terry and [place]   Types of Service: Individual psychotherapy and Telephone visit  I connected with [name] and/or Manuela Schwartz Shaw's  n/a  via  Telephone or Video Enabled Telemedicine Application  (Video is Caregility application) and verified that I am speaking with the correct person using two identifiers. Discussed confidentiality: Yes   I discussed the limitations of telemedicine and the availability of in person appointments.  Discussed there is a possibility of technology failure and discussed alternative modes of communication if that failure occurs.  I discussed that engaging in this telemedicine visit, they consent to the provision of behavioral healthcare and the services will be billed under their insurance.  Patient and/or legal guardian expressed understanding and consented to Telemedicine visit: Yes   Presenting Concerns: Patient and/or family reports the following symptoms/concerns: Insomnia (sleeping two hours/night), fatigue, difficulty relaxing; irritability, poor appetite (lost 20lbs in one month); pt attributes to balancing current life stress (family dynamics, caring for mom with heart and short-term memory issues; caring for husband with congestive heart failure and COPD; three grown daughters who have all lost their fathers,  8 grandchildren- oldest recently grieving father's loss), while managing her own healthcare. 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 Pt's goal is to rely more on self-coping strategies and less on medication.  Duration of problem: Ongoing; Severity of problem:  moderately severe  Patient and/or Family's Strengths/Protective Factors: Social connections and Sense of purpose  Goals Addressed: Patient will:  Reduce symptoms of: anxiety, depression, insomnia, and stress   Increase knowledge and/or ability of: self-management skills and stress reduction   Demonstrate ability to: Increase healthy adjustment to current life circumstances and Increase adequate support systems for patient/family  Progress towards Goals: Ongoing  Interventions: Interventions utilized:  Mindfulness or Psychologist, educational, Psychoeducation and/or Health Education, and Link to [employer] Standardized Assessments completed: Not Needed  Patient and/or Family Response: Patient agrees with treatment plan.   Assessment: Patient currently experiencing Adjustment disorder with mixed anxious and depressed mood; Psychosocial stress.   Patient may benefit from psychoeducation and brief therapeutic interventions regarding coping with symptoms of anxiety, depression, life stress .  Plan: Follow up with behavioral health clinician on : Three weeks Behavioral recommendations:  -Continue attending methadone maintenance clinic daily; continue taking BH medication/Xanax, as prescribed as needed  -Continue daily walks with dog; using fan at night to sleep -CALM relaxation breathing exercise twice daily (morning; at bedtime with sleep sounds); as needed throughout the day. -Continue plan to inquire about [employer] Program with dog; register as able -Consider additional community supports, as discussed (see After Visit Summary for caregiver and grief support resources; use [employer] as needed) Referral(s): Integrated Orthoptist (In Clinic) and  [employer] Resources:  Caregiver support; Grief support  I discussed the assessment and treatment plan with the patient and/or parent/guardian. They were provided an opportunity to ask questions and all were answered. They agreed with the plan and demonstrated an understanding  of the instructions.   They were advised to call back or seek an in-person evaluation if the symptoms worsen or if the condition fails to improve as anticipated.  Garlan Fair, LCSW     07/15/2022    8:43 AM 06/17/2022    2:55 PM 03/09/2022   10:26 AM 02/03/2022   11:27 AM 10/08/2021    2:05 PM  Depression screen PHQ 2/9  Decreased Interest '2 2 1 2 2  '$ Down, Depressed, Hopeless '1 2 1 2 1  '$ PHQ - 2 Score '3 4 2 4 3  '$ Altered sleeping '3 2 1 3 3  '$ Tired, decreased energy '3 2 2 3 3  '$ Change in appetite '2 2 2 3 3  '$ Feeling bad or failure about yourself  '1 2 1 1 1  '$ Trouble concentrating '2 1 2 3 3  '$ Moving slowly or fidgety/restless 2 0 2 1 0  Suicidal thoughts 0 0 0 0 0  PHQ-9 Score '16 13 12 18 16      '$ 07/15/2022    8:43 AM 06/17/2022    2:56 PM 03/09/2022   10:27 AM 02/03/2022   11:28 AM  GAD 7 : Generalized Anxiety Score  Nervous, Anxious, on Edge '1 2 1 2  '$ Control/stop worrying '1 3 1 1  '$ Worry too much - different things '2 3 1 1  '$ Trouble relaxing '3 3 1 1  '$ Restless '1  1 1  '$ Easily annoyed or irritable '3 1 2 1  '$ Afraid - awful might happen 1 0 2 2  Total GAD 7 Score 12  9 9

## 2022-07-28 ENCOUNTER — Telehealth: Payer: Self-pay | Admitting: *Deleted

## 2022-07-28 NOTE — Telephone Encounter (Signed)
Pt left message stating that she would like to speak to a nurse about the Megace which was prescribed as well as questions about other medications.

## 2022-07-29 NOTE — Telephone Encounter (Signed)
Called patient and a man answered stating she wasn't at home right now and was at the doctors office. Told him to let the patient know we were returning her phone call. He stated he would.

## 2022-08-03 ENCOUNTER — Ambulatory Visit: Payer: Medicaid Other | Admitting: Clinical

## 2022-08-03 DIAGNOSIS — Z658 Other specified problems related to psychosocial circumstances: Secondary | ICD-10-CM

## 2022-08-03 DIAGNOSIS — F4323 Adjustment disorder with mixed anxiety and depressed mood: Secondary | ICD-10-CM

## 2022-08-03 NOTE — Patient Instructions (Signed)
Center for [facility] at [facility] for Women [place], [place] [address] [phone] (main office) [phone] (Tandy Lewin's office)  [facility] (Individual and group grief support) [facility]  734-635-9159  [facility] (Caregiver Support) www.well-springs[facility].org  /Emotional Wellbeing Pharmacist, hospital Here are a few free apps meant to help you to help yourself.  To find, try searching on the internet to see if the app is offered on Apple/Android devices. If your first choice doesn't come up on your device, the good news is that there are many choices! Play around with different apps to see which ones are helpful to you.    Calm This is an app meant to help increase calm feelings. Includes info, strategies, and tools for tracking your feelings.      Calm Harm  This app is meant to help with self-harm. Provides many 5-minute or 15-min coping strategies for doing instead of hurting yourself.       [place] is a problem-solving tool to help deal with emotions and cope with stress you encounter wherever you are.      MindShift This app can help people cope with anxiety. Rather than trying to avoid anxiety, you can make an important shift and face it.      MY3  MY3 features a support system, safety plan and resources with the goal of offering a tool to use in a time of need.       My Life My Voice  This mood journal offers a simple solution for tracking your thoughts, feelings and moods. Animated emoticons can help identify your mood.       Relax Melodies Designed to help with sleep, on this app you can mix sounds and meditations for relaxation.      Smiling Mind Smiling Mind is meditation made easy: it's a simple tool that helps put a smile on your mind.        Stop, Breathe & Think  A friendly, simple guide for people through meditations for mindfulness and compassion.  Stop, Breathe and Think  Kids Enter your current feelings and choose a "mission" to help you cope. Offers videos for certain moods instead of just sound recordings.       Team [place] The goal of this tool is to help teens change how they think, act, and react. This app helps you focus on your own good feelings and experiences.      The [place] Box The [place] Box (VHB) contains simple tools to help patients with coping, relaxation, distraction, and positive thinking.

## 2022-08-05 ENCOUNTER — Telehealth: Payer: Self-pay | Admitting: Family Medicine

## 2022-08-05 NOTE — Telephone Encounter (Signed)
Patient would like a call back from a nurse regarding her procedure and u/s she had along with medication questions

## 2022-08-10 NOTE — Telephone Encounter (Signed)
Returned patients call. She was informed of EMB and last [place] results based on Dr. Orbie Pyo MyChart messages to patient. Patient voiced understanding.    Reviewed Megace purpose and directions for taking. She asked if she can take Midol, Fe, Penicillin and Vitamin D, reviewed that is fine.   Reviewed follow up dates and times of [facility] and GYN visits.   Patient does not have My Chart, she is planning to call them to set it up on her new phone.   Patient informed to call back with any questions or concerns as needed.

## 2022-08-10 NOTE — BH Specialist Note (Signed)
Pt requests to reschedule due to conflicting appointments; rescheduled for virtual visit on 09/13/2022 at 3:45pm.

## 2022-08-20 ENCOUNTER — Telehealth (HOSPITAL_COMMUNITY): Payer: Self-pay | Admitting: Emergency Medicine

## 2022-08-20 ENCOUNTER — Emergency Department (HOSPITAL_COMMUNITY)
Admission: EM | Admit: 2022-08-20 | Discharge: 2022-08-20 | Disposition: A | Discharge status: Home / Self Care | Payer: Medicaid Other | Attending: Emergency Medicine | Admitting: Emergency Medicine

## 2022-08-20 ENCOUNTER — Encounter (HOSPITAL_COMMUNITY): Payer: Self-pay

## 2022-08-20 ENCOUNTER — Other Ambulatory Visit: Payer: Self-pay

## 2022-08-20 DIAGNOSIS — R21 Rash and other nonspecific skin eruption: Secondary | ICD-10-CM | POA: Diagnosis not present

## 2022-08-20 DIAGNOSIS — I10 Essential (primary) hypertension: Secondary | ICD-10-CM | POA: Insufficient documentation

## 2022-08-20 DIAGNOSIS — D649 Anemia, unspecified: Secondary | ICD-10-CM | POA: Insufficient documentation

## 2022-08-20 DIAGNOSIS — L03115 Cellulitis of right lower limb: Secondary | ICD-10-CM | POA: Diagnosis not present

## 2022-08-20 DIAGNOSIS — L03116 Cellulitis of left lower limb: Secondary | ICD-10-CM | POA: Insufficient documentation

## 2022-08-20 DIAGNOSIS — L03119 Cellulitis of unspecified part of limb: Secondary | ICD-10-CM

## 2022-08-20 DIAGNOSIS — R2241 Localized swelling, mass and lump, right lower limb: Secondary | ICD-10-CM | POA: Diagnosis present

## 2022-08-20 LAB — CBC WITH DIFFERENTIAL/PLATELET
Abs Immature Granulocytes: 0.08 10*3/uL — ABNORMAL HIGH (ref 0.00–0.07)
Basophils Absolute: 0.1 10*3/uL (ref 0.0–0.1)
Basophils Relative: 1 %
Eosinophils Absolute: 0.2 10*3/uL (ref 0.0–0.5)
Eosinophils Relative: 2 %
HCT: 26.9 % — ABNORMAL LOW (ref 36.0–46.0)
Hemoglobin: 7.4 g/dL — ABNORMAL LOW (ref 12.0–15.0)
Immature Granulocytes: 1 %
Lymphocytes Relative: 26 %
Lymphs Abs: 2.4 10*3/uL (ref 0.7–4.0)
MCH: 19.2 pg — ABNORMAL LOW (ref 26.0–34.0)
MCHC: 27.5 g/dL — ABNORMAL LOW (ref 30.0–36.0)
MCV: 69.9 fL — ABNORMAL LOW (ref 80.0–100.0)
Monocytes Absolute: 0.8 10*3/uL (ref 0.1–1.0)
Monocytes Relative: 9 %
Neutro Abs: 5.7 10*3/uL (ref 1.7–7.7)
Neutrophils Relative %: 61 %
Platelets: 317 10*3/uL (ref 150–400)
RBC: 3.85 MIL/uL — ABNORMAL LOW (ref 3.87–5.11)
RDW: 18.6 % — ABNORMAL HIGH (ref 11.5–15.5)
WBC: 9.2 10*3/uL (ref 4.0–10.5)
nRBC: 0.2 % (ref 0.0–0.2)

## 2022-08-20 LAB — COMPREHENSIVE METABOLIC PANEL
ALT: 25 U/L (ref 0–44)
AST: 22 U/L (ref 15–41)
Albumin: 3.6 g/dL (ref 3.5–5.0)
Alkaline Phosphatase: 57 U/L (ref 38–126)
Anion gap: 10 (ref 5–15)
BUN: 12 mg/dL (ref 6–20)
CO2: 26 mmol/L (ref 22–32)
Calcium: 8.4 mg/dL — ABNORMAL LOW (ref 8.9–10.3)
Chloride: 104 mmol/L (ref 98–111)
Creatinine, Ser: 0.84 mg/dL (ref 0.44–1.00)
GFR, Estimated: >60 mL/min (ref >60)
Glucose, Bld: 87 mg/dL (ref 70–99)
Potassium: 3.6 mmol/L (ref 3.5–5.1)
Sodium: 140 mmol/L (ref 135–145)
Total Bilirubin: 0.7 mg/dL (ref 0.3–1.2)
Total Protein: 6.5 g/dL (ref 6.5–8.1)

## 2022-08-20 LAB — TYPE AND SCREEN
ABO/RH(D): O POS
Antibody Screen: NEGATIVE

## 2022-08-20 MED ORDER — HYDROCODONE-ACETAMINOPHEN 5-325 MG PO TABS: 2.0000 | ORAL_TABLET | ORAL | 0 refills | Status: DC | PRN

## 2022-08-20 MED ORDER — ONDANSETRON 4 MG PO TBDP
4.0000 mg | ORAL_TABLET | Freq: Once | ORAL | Status: AC
  Administered 2022-08-20: 4 mg via ORAL
  Filled 2022-08-20: qty 1

## 2022-08-20 MED ORDER — CEPHALEXIN 500 MG PO CAPS: 500.0000 mg | ORAL_CAPSULE | Freq: Four times a day (QID) | ORAL | 0 refills | Status: AC

## 2022-08-20 MED ORDER — HYDROCODONE-ACETAMINOPHEN 5-325 MG PO TABS
1.0000 | ORAL_TABLET | Freq: Once | ORAL | Status: AC
  Administered 2022-08-20: 1 via ORAL
  Filled 2022-08-20: qty 1

## 2022-08-20 MED ORDER — ONDANSETRON HCL 4 MG PO TABS: 4.0000 mg | ORAL_TABLET | Freq: Four times a day (QID) | ORAL | 0 refills | Status: AC

## 2022-08-20 MED ORDER — HYDROCODONE-ACETAMINOPHEN 5-325 MG PO TABS
2.0000 | ORAL_TABLET | ORAL | 0 refills | Status: DC | PRN
  Filled 2022-08-20: qty 10, 1d supply, fill #0

## 2022-08-20 NOTE — Telephone Encounter (Signed)
Medication needed reordering.

## 2022-08-20 NOTE — ED Provider Triage Note (Signed)
Emergency Medicine Provider Triage Evaluation Note  Nancy Terry , a 47 y.o. female  was evaluated in triage.  Pt complains of bilateral lower extremity pain and anemia.  Was at her primary care provider's office at [facility] in [place].  Lower extremities have intermittent erythema bilaterally for the past 6 years.  States the swelling has gotten worse over the past 2 weeks.  Reports intermittent numbness to the feet.  Denies fevers or chills.  States she has heavy menstrual cycles which is the cause of her anemia.  Does not know what her hemoglobin was today.  Reports associated dyspnea on exertion and lightheadedness.  States she had bilateral DVT studies done at [facility] which was negative.  Review of Systems  Positive: As above Negative: As above  Physical Exam  BP (!) 146/54 (BP Location: Left Arm)   Pulse (!) 59   Temp 98.4 F (36.9 C)   Resp 18   Ht 5\' 6"  (1.676 m)   Wt 102.1 kg   SpO2 92%   BMI 36.32 kg/m  Gen:   Awake, no distress   Resp:  Normal effort  MSK:   Moves extremities without difficulty  Other:  Bilateral lower extremity erythema to the anterior tibia with right worse than left.  Compartments soft.  Sensation intact bilaterally.  Medical Decision Making  Medically screening exam initiated at 1:10 PM.  Appropriate orders placed.  Lutha Shocklee was informed that the remainder of the evaluation will be completed by another provider, this initial triage assessment does not replace that evaluation, and the importance of remaining in the ED until their evaluation is complete.     Roylene Reason, [place] 08/20/22 1312

## 2022-08-20 NOTE — ED Provider Notes (Signed)
Nancy Terry Provider Note   CSN: [id number] Arrival date & time: 08/20/22  1251     History  Chief Complaint  Patient presents with   Bilateral Leg pain/swelling    Nancy Terry is a 47 y.o. 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 Bil legs have been swollen/red/numb for the last week. Pt reports both of her legs have now "busted open," denies draining. No fevers or falls. Decreased sensation in both feet d/t swelling, can still ambulate but is painful. Rates pain 6/10 while in triage.  Patient states she has had low-grade fevers at home.  Denies vomiting, but has had nausea. HPI     Home Medications Prior to Admission medications   Medication Sig Start Date End Date Taking? Authorizing Provider  cephALEXin (KEFLEX) 500 MG capsule Take 1 capsule (500 mg total) by mouth 4 (four) times daily for 7 days. 08/20/22 08/27/22 Yes Bradd Canary, MD  ondansetron (ZOFRAN) 4 MG tablet Take 1 tablet (4 mg total) by mouth every 6 (six) hours. 08/20/22  Yes Bradd Canary, MD  acetaminophen-codeine (TYLENOL #3) 300-30 MG tablet Take 1-2 tablets by mouth every 6 (six) hours as needed for moderate pain. Patient not taking: Reported on 06/17/2022 05/02/20   Raylene Everts, MD  ALPRAZolam Duanne Moron) 0.25 MG tablet Take 0.25 mg by mouth 2 (two) times daily. 06/14/22   [provider]  clindamycin (CLEOCIN T) 1 % lotion Apply topically 2 (two) times daily. Patient not taking: Reported on 06/17/2022 11/18/20   Charlott Rakes, MD  ferrous sulfate 325 (65 FE) MG EC tablet Take 1 tablet (325 mg total) by mouth daily with breakfast. Patient not taking: Reported on 06/17/2022 03/10/22   Gildardo Pounds, NP  fluticasone [facility]) 50 MCG/ACT nasal spray Place 2 sprays into both nostrils daily. Patient not taking: Reported on 03/09/2022 02/03/22   Argentina Donovan, PA-C   furosemide (LASIX) 20 MG tablet Take 1 tablet (20 mg total) by mouth daily. Patient not taking: Reported on 06/17/2022 05/02/20   Raylene Everts, MD  gabapentin (NEURONTIN) 300 MG capsule Take 1 capsule (300 mg total) by mouth at bedtime. Patient not taking: Reported on 06/17/2022 03/09/22   Gildardo Pounds, NP  HYDROcodone-acetaminophen (NORCO/VICODIN) 5-325 MG tablet Take 2 tablets by mouth every 4 (four) hours as needed. 08/20/22   Carmin Muskrat, MD  hydrOXYzine (ATARAX/VISTARIL) 25 MG tablet Take 1 tablet (25 mg total) by mouth every 8 (eight) hours as needed for anxiety. Patient not taking: Reported on 06/17/2022 10/10/20   Raspet, Derry Skill, PA-C  ibuprofen (ADVIL) 200 MG tablet Take 200 mg by mouth every 6 (six) hours as needed.     [provider]  megestrol (MEGACE) 40 MG tablet Take one tablet three times a day for 3 days, then one tablet twice daily for 3 days, then one tablet daily 07/15/22   Darliss Cheney, MD  methadone (DOLOPHINE) 10 MG/5ML solution Take 120 mg by mouth daily.    [provider]  promethazine (PHENERGAN) 25 MG tablet Take 1 tablet (25 mg total) by mouth every 8 (eight) hours as needed for nausea or vomiting. Patient not taking: Reported on 06/17/2022 03/09/22   Gildardo Pounds, NP  silver sulfADIAZINE (SILVADENE) 1 % cream Apply 1 application topically daily. Patient not taking: Reported on 06/17/2022 05/02/20   Raylene Everts, MD  Allergies    Haldol [haloperidol decanoate] and Nitrates, organic    Review of Systems   See HPI  Physical Exam Updated Vital Signs BP (!) 151/71 (BP Location: Left Arm)   Pulse (!) 50   Temp 98.4 F (36.9 C) (Oral)   Resp 12   Ht 5\' 6"  (1.676 m)   Wt 102.1 kg   SpO2 99%   BMI 36.32 kg/m  Physical Exam Vitals and nursing note reviewed.  Constitutional:      General: She is not in acute distress.    Appearance: She is well-developed.     Comments: Appears uncomfortable.  HENT:     Head:  Normocephalic and atraumatic.     Mouth/Throat:     Mouth: Mucous membranes are moist.     Pharynx: Oropharynx is clear.  Eyes:     Conjunctiva/sclera: Conjunctivae normal.  Cardiovascular:     Rate and Rhythm: Normal rate and regular rhythm.     Pulses: Normal pulses.     Heart sounds: Normal heart sounds. No murmur heard. Pulmonary:     Effort: Pulmonary effort is normal. No respiratory distress.     Breath sounds: Normal breath sounds.  Abdominal:     Palpations: Abdomen is soft.     Tenderness: There is no abdominal tenderness.  Musculoskeletal:        General: Tenderness (Tenderness to palpation over rash area on BLE) present. No swelling.     Cervical back: Normal range of motion and neck supple.  Skin:    General: Skin is warm and dry.     Capillary Refill: Capillary refill takes less than 2 seconds.     Findings: Erythema (Bilateral lower extremities from mid calf down to feet.) and lesion (Nonhealing lesion located on right lateral lower extremity) present. No bruising.     Comments: Patient has notable blisters to BLE over area of erythema, most notable on right   Neurological:     Mental Status: She is alert.  Psychiatric:        Mood and Affect: Mood normal.     ED Results / Procedures / Treatments   Labs (all labs ordered are listed, but only abnormal results are displayed) Labs Reviewed  COMPREHENSIVE METABOLIC PANEL - Abnormal; Notable for the following components:      Result Value   Calcium 8.4 (*)    All other components within normal limits  CBC WITH DIFFERENTIAL/PLATELET - Abnormal; Notable for the following components:   RBC 3.85 (*)    Hemoglobin 7.4 (*)    HCT 26.9 (*)    MCV 69.9 (*)    MCH 19.2 (*)    MCHC 27.5 (*)    RDW 18.6 (*)    Abs Immature Granulocytes 0.08 (*)    All other components within normal limits  TYPE AND SCREEN   Medications Ordered in ED Medications  HYDROcodone-acetaminophen (NORCO/VICODIN) 5-325 MG per tablet 1 tablet  (1 tablet Oral Given 08/20/22 1756)  ondansetron (ZOFRAN-ODT) disintegrating tablet 4 mg (4 mg Oral Given 08/20/22 1757)    ED Course/ Medical Decision Making/ A&P  Medical Decision Making Risk Prescription drug management.   Patient arrived hemodynamically stable, slightly bradycardic.  Saturating well on room air and afebrile.  Exam she has erythema and blisters to bilateral lower extremities from mid calf down to feet.  Pulses are present on exam.  She does have extreme tenderness to palpation over these areas.  She was given Zofran for nausea and Norco for  pain relief in the emergency department.  CBC significant for anemia of 7.4 hemoglobin down from 9.4 5 months ago, but does appear to be iron deficiency microcytic anemia.  No notable leukocytosis.  CMP negative for acute electrolyte abnormalities or signs of AKI.  No further imaging deemed necessary at today's visit.  Patient states that earlier today she had ultrasound performed on bilateral lower extremities that ruled out DVT.  She was prescribed an antibiotic by her PCP that she has yet to pick up because she was told to come to the emergency department.  From today's visit she was prescribed Keflex, Norco and Zofran for nausea.  She was also instructed to take iron supplement and make a follow-up appointment in few weeks with her primary care for lab work.        Final Clinical Impression(s) / ED Diagnoses Final diagnoses:  Cellulitis of lower extremity, unspecified laterality    Rx / DC Orders ED Discharge Orders          Ordered    HYDROcodone-acetaminophen (NORCO/VICODIN) 5-325 MG tablet  Every 4 hours PRN,   Status:  Discontinued        08/20/22 1749    ondansetron (ZOFRAN) 4 MG tablet  Every 6 hours        08/20/22 1749    cephALEXin (KEFLEX) 500 MG capsule  4 times daily        08/20/22 1749              Bradd Canary, MD 08/20/22 2330    Carmin Muskrat, MD 08/21/22 1744

## 2022-08-20 NOTE — Discharge Instructions (Addendum)
You likely do have infection in both of your legs.  You have been prescribed antibiotic please take as written.  You can also given a couple days worth of Norco to use as needed for pain.  Please continue to take scheduled Motrin every 6 hours and schedule Tylenol every 4-6 hours and just use the Norco as needed for breakthrough pain.  You have been also given a prescription for Zofran that you can place in your tongue as needed for nausea.

## 2022-08-20 NOTE — ED Triage Notes (Addendum)
Pt came in via POV d/r PCP recommendation d/t her being anemic & both of her legs swelling. Bil legs have been swollen/red/numb for the last week. Pt reports both of her legs have now "busted open," denies draining. No fevers or falls. Decreased sensation in both feet d/t swelling, can still ambulate but is painful. Rates pain 6/10 while in triage.

## 2022-08-21 ENCOUNTER — Telehealth: Payer: Self-pay | Admitting: Surgery

## 2022-08-21 ENCOUNTER — Other Ambulatory Visit: Payer: Self-pay

## 2022-08-21 NOTE — Telephone Encounter (Signed)
E RNCM received call from patient concerning discharge  prescription clarification, secure chat message sent to EDP for clarification.

## 2022-08-23 ENCOUNTER — Other Ambulatory Visit: Payer: Self-pay

## 2022-08-24 ENCOUNTER — Ambulatory Visit: Payer: Medicaid Other | Admitting: Clinical

## 2022-08-24 DIAGNOSIS — Z91199 Patient's noncompliance with other medical treatment and regimen due to unspecified reason: Secondary | ICD-10-CM
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
## 2022-09-02 ENCOUNTER — Telehealth: Payer: Self-pay

## 2022-09-02 NOTE — Telephone Encounter (Signed)
VM left requesting to speak with a nurse about her last visit, last procedure, medications. Also states she has recently taken an antibiotic and is concerned she has a UTI.

## 2022-09-06 NOTE — Telephone Encounter (Signed)
Returned patients call. Patient did not answer. LM for her to call the office at [phone] at her convenience.

## 2022-09-13 ENCOUNTER — Ambulatory Visit (INDEPENDENT_AMBULATORY_CARE_PROVIDER_SITE_OTHER): Payer: Medicaid Other | Admitting: Clinical

## 2022-09-13 DIAGNOSIS — Z658 Other specified problems related to psychosocial circumstances: Secondary | ICD-10-CM | POA: Diagnosis not present

## 2022-09-13 DIAGNOSIS — F4323 Adjustment disorder with mixed anxiety and depressed mood: Secondary | ICD-10-CM

## 2022-09-13 DIAGNOSIS — F430 Acute stress reaction: Secondary | ICD-10-CM | POA: Diagnosis not present

## 2022-09-20 ENCOUNTER — Encounter: Payer: Self-pay | Admitting: Obstetrics and Gynecology

## 2022-09-20 ENCOUNTER — Other Ambulatory Visit: Payer: Self-pay

## 2022-09-20 ENCOUNTER — Ambulatory Visit (INDEPENDENT_AMBULATORY_CARE_PROVIDER_SITE_OTHER): Payer: Medicaid Other | Admitting: Obstetrics and Gynecology

## 2022-09-20 VITALS — BP 132/79 | HR 59 | Wt 220.8 lb

## 2022-09-20 DIAGNOSIS — N920 Excessive and frequent menstruation with regular cycle: Secondary | ICD-10-CM | POA: Diagnosis not present

## 2022-09-20 NOTE — Progress Notes (Unsigned)
    GYNECOLOGY VISIT  Patient name: Nancy Terry MRN [id number]  Date of birth: 1975-10-31 Chief Complaint:   Follow-up   History:  Nancy Terry is a 47 y.o. G3P3000 being seen today for ***.  Hasn't starte taking megace and   Having adh bilateral lower extermity pain and right lower leg with echymotic/dark red band like area above the ankle. Reports provider at [facility]  - cellultis, has completed antibiotics Has had pain in bilateral groin folds Not sure why she got   Past Medical History:  Diagnosis Date   Anxiety    Depression    Hepatitis C    Herpes infection    Hypertension    Kidney stones    Migraines    Narcotic abuse     Past Surgical History:  Procedure Laterality Date   BREAST BIOPSY     ELBOW SURGERY Left    KIDNEY STONE SURGERY     with stent placement   LITHOTRIPSY     TUBAL LIGATION      The following portions of the patient's history were reviewed and updated as appropriate: allergies, current medications, past family history, past medical history, past social history, past surgical history and problem list.   Health Maintenance:   Last pap ***. Results were: {Pap findings:25134}. H/O abnormal pap: {yes/yes***/no:23866} Last mammogram: ***. Results were: {normal, abnormal, n/a:23837}. Family h/o breast cancer: {yes***/no:23838}   Review of Systems:  {Ros - complete:30496} Comprehensive review of systems was otherwise negative.   Objective:  Physical Exam BP 132/79   Pulse (!) 59   Wt 220 lb 12.8 oz (100.2 kg)   LMP 09/08/2022 (Approximate)   BMI 35.64 kg/m    Physical Exam   Labs and Imaging No results found.     Assessment & Plan:   There are no diagnoses linked to this encounter.   *** Routine preventative health maintenance measures emphasized.  Nancy Shire, MD Minimally Invasive Gynecologic Surgery [facility], [facility]

## 2022-09-20 NOTE — Patient Instructions (Signed)
Start taking the megace twice a day to help with the menstrual pain

## 2022-09-25 IMAGING — US US EXTREM LOW VENOUS
1 series · 12 of 24 positions shown · non-contrast
Comparison: None.

CLINICAL DATA: 44-year-old female with a history of bilateral lower
extremity venous wounds



[Series 1: us extrem low venous · 0.09mm/px · 12 of 53 slices shown]
[im 3/53]
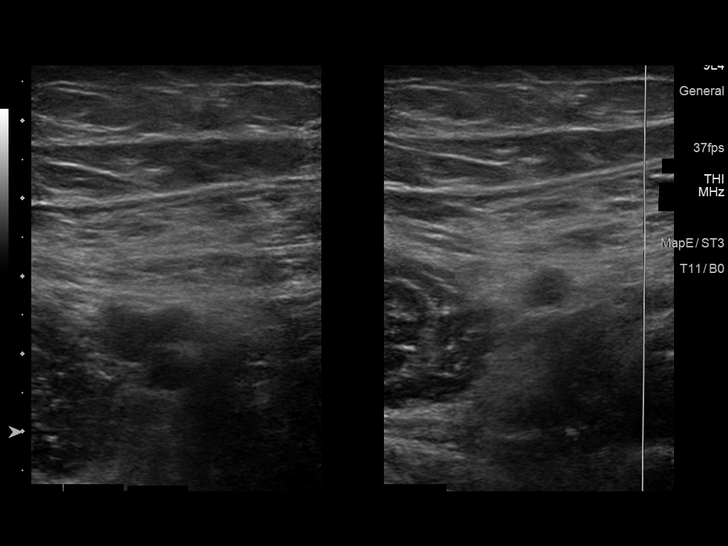
[im 7/53]
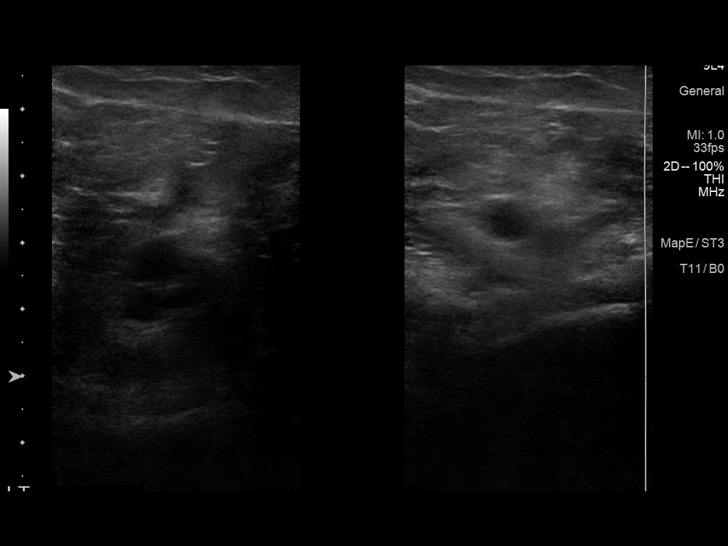
[im 12/53]
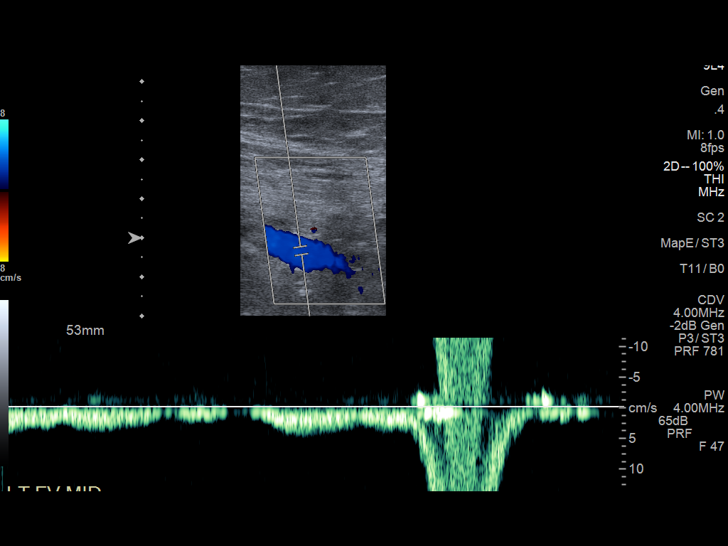
[im 16/53]
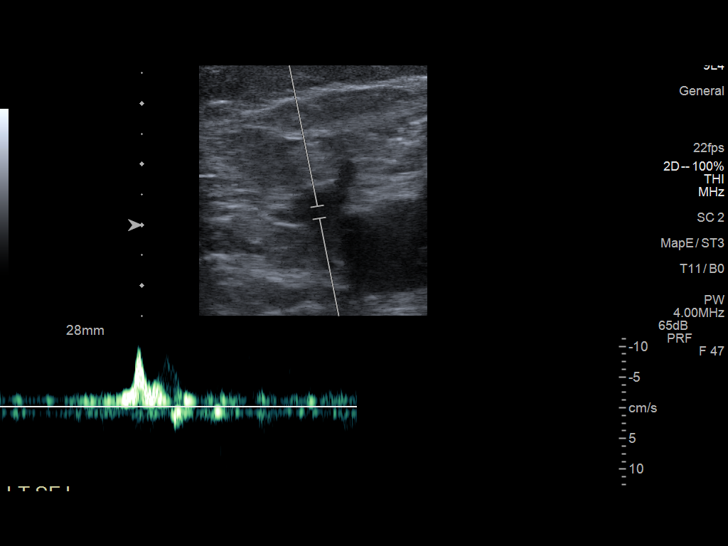
[im 21/53]
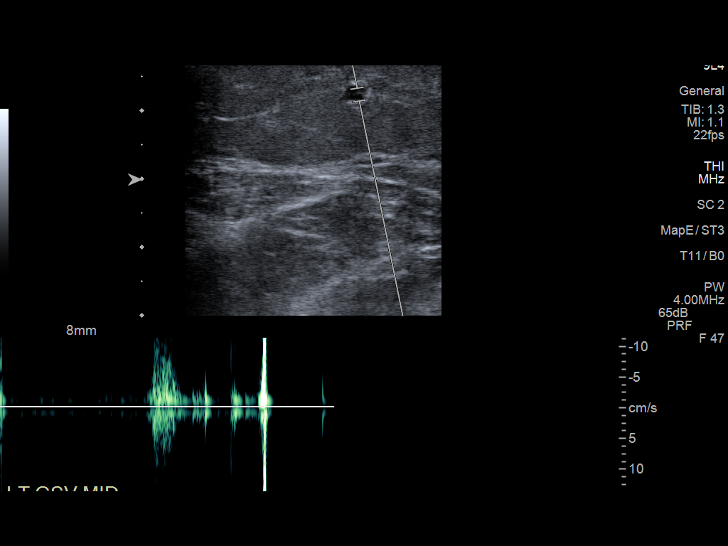
[im 25/53]
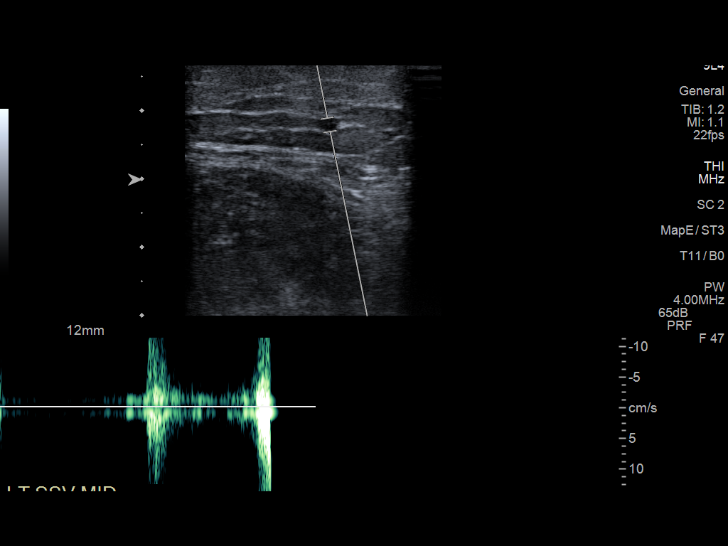
[im 30/53]
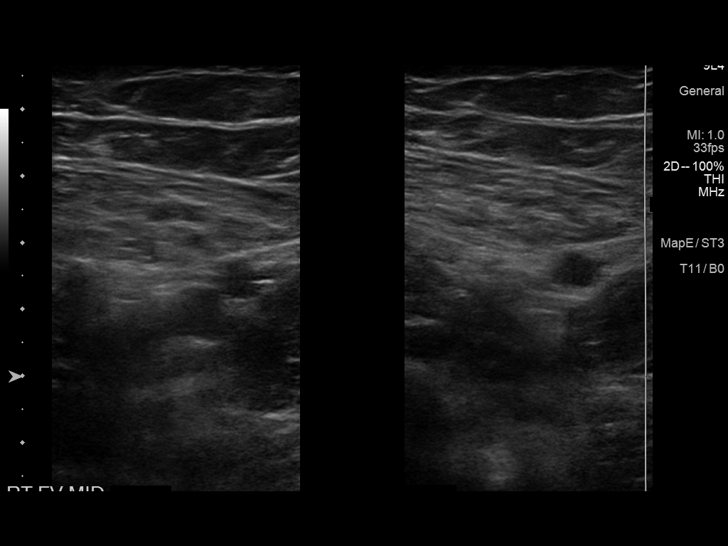
[im 34/53]
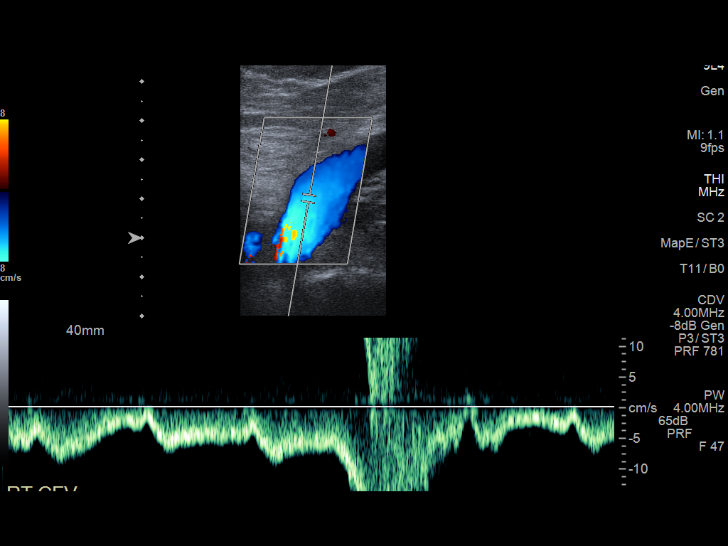
[im 39/53]
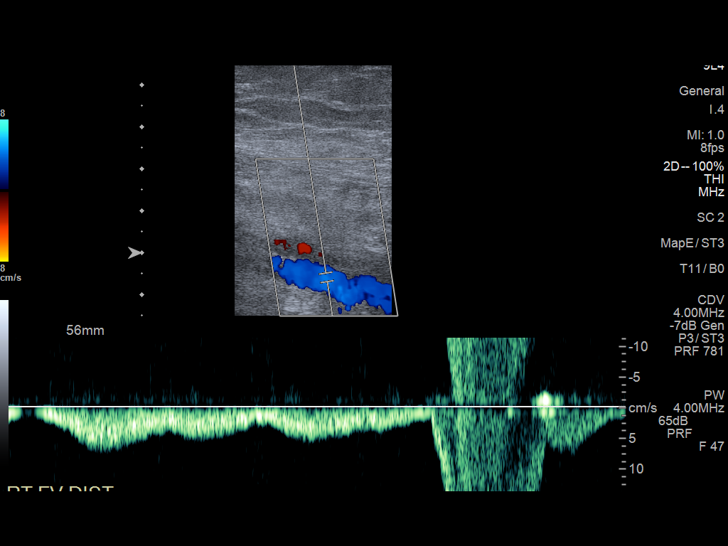
[im 43/53]
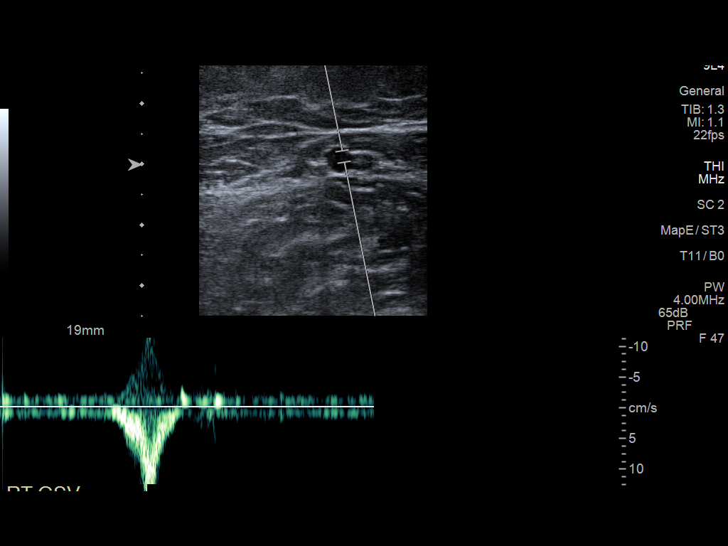
[im 48/53]
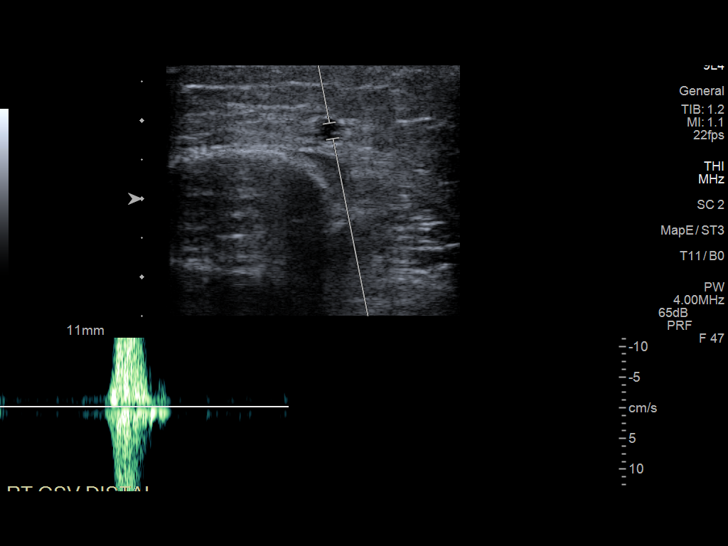
[im 53/53]
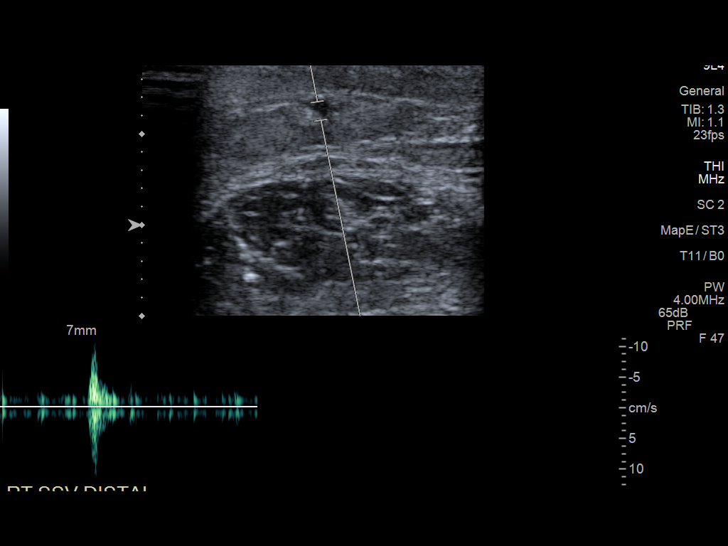

[12 of 24 positions shown; findings below may reference images not displayed]

FINDINGS: RIGHT LOWER EXTREMITY

Common Femoral Vein: No evidence of thrombus. Normal
compressibility, respiratory phasicity and response to augmentation.

Saphenofemoral Junction: No evidence of thrombus. Normal
compressibility and flow on color Doppler imaging.

Profunda Femoral Vein: No evidence of thrombus. Normal
compressibility and flow on color Doppler imaging.

Femoral Vein: No evidence of thrombus. Normal compressibility,
respiratory phasicity and response to augmentation.

Popliteal Vein: No evidence of thrombus. Normal compressibility,
respiratory phasicity and response to augmentation.

Calf Veins: No evidence of thrombus. Normal compressibility and flow
on color Doppler imaging.

GSV at SFJ:  No reflux

GSV at Proximal Thigh:  No reflux

GSV at Mid Thigh:  No reflux

GSV at Distal Thigh:  No reflux

GSV at Prox Calf:  No reflux

GSV at Mid Calf:  No reflux

GSV at Distal Calf:  No reflux

SSV at Sapheno popliteal junction:  No reflux

SSV at mid calf:  No reflux

SSF at distal calf:  No reflux

Varicosities:  None

Other Findings: Single perforator vein at the left anterior calf,
near the previous wound with no reflux.

LEFT LOWER EXTREMITY

Common Femoral Vein: No evidence of thrombus. Normal
compressibility, respiratory phasicity and response to augmentation.

Saphenofemoral Junction: No evidence of thrombus. Normal
compressibility and flow on color Doppler imaging.

Profunda Femoral Vein: No evidence of thrombus. Normal
compressibility and flow on color Doppler imaging.

Femoral Vein: No evidence of thrombus. Normal compressibility,
respiratory phasicity and response to augmentation.

Popliteal Vein: No evidence of thrombus. Normal compressibility,
respiratory phasicity and response to augmentation.

Calf Veins: No evidence of thrombus. Normal compressibility and flow
on color Doppler imaging.

GSV at SFJ:  No reflux

GSV at Proximal Thigh:  No reflux

GSV at Mid Thigh:  No reflux

GSV at Distal Thigh:  No reflux

GSV at Prox Calf:  No reflux

GSV at Mid Calf:  No reflux

GSV at Distal Calf:  No reflux

SSV at Sapheno popliteal junction:  No reflux

SSV at mid calf:  No reflux

SSF at distal calf:  No reflux

Varicosities:  None

Other Findings:  None.
IMPRESSION: Sonographic survey of the bilateral lower extremities negative for
DVT.

Negative for reflux in the bilateral lower extremities.

## 2023-01-06 ENCOUNTER — Ambulatory Visit: Payer: MEDICAID | Admitting: Obstetrics and Gynecology

## 2023-01-31 ENCOUNTER — Other Ambulatory Visit (HOSPITAL_COMMUNITY): Payer: Self-pay | Admitting: *Deleted

## 2023-01-31 DIAGNOSIS — R0609 Other forms of dyspnea: Secondary | ICD-10-CM

## 2023-02-16 ENCOUNTER — Telehealth (HOSPITAL_COMMUNITY): Payer: Self-pay | Admitting: *Deleted

## 2023-02-16 NOTE — Telephone Encounter (Signed)
Reaching out to patient to offer assistance regarding upcoming cardiac imaging study; pt verbalizes understanding of appt date/time, parking situation and where to check in, pre-test NPO status and medications ordered, and verified current allergies; name and call back number provided for further questions should they arise Johney Frame RN Navigator Cardiac Imaging [facility] [phone] office [phone] cell  Patient reports HR in the 50s.

## 2023-02-16 NOTE — Telephone Encounter (Signed)
Reaching out to patient to offer assistance regarding upcoming cardiac imaging study; patient request call back at later time. Johney Frame RN Navigator Cardiac Imaging [facility] [phone] office [phone] cell

## 2023-02-17 ENCOUNTER — Ambulatory Visit (HOSPITAL_COMMUNITY)
Admission: RE | Admit: 2023-02-17 | Discharge: 2023-02-17 | Disposition: A | Discharge status: Home / Self Care | Payer: MEDICAID | Source: Ambulatory Visit | Attending: Family Medicine | Admitting: Family Medicine

## 2023-02-17 ENCOUNTER — Ambulatory Visit (HOSPITAL_COMMUNITY): Payer: MEDICAID

## 2023-02-17 DIAGNOSIS — R0609 Other forms of dyspnea: Secondary | ICD-10-CM | POA: Insufficient documentation

## 2023-02-17 MED ORDER — NITROGLYCERIN 0.4 MG SL SUBL
SUBLINGUAL_TABLET | SUBLINGUAL | Status: AC
  Filled 2023-02-17: qty 2

## 2023-02-17 MED ORDER — NITROGLYCERIN 0.4 MG SL SUBL
0.8000 mg | SUBLINGUAL_TABLET | Freq: Once | SUBLINGUAL | Status: AC
  Administered 2023-02-17: 0.8 mg via SUBLINGUAL

## 2023-02-17 MED ORDER — IOHEXOL 350 MG/ML SOLN
95.0000 mL | Freq: Once | INTRAVENOUS | Status: AC | PRN
  Administered 2023-02-17: 95 mL via INTRAVENOUS

## 2023-03-09 ENCOUNTER — Ambulatory Visit (INDEPENDENT_AMBULATORY_CARE_PROVIDER_SITE_OTHER): Payer: MEDICAID | Admitting: Obstetrics and Gynecology

## 2023-03-09 VITALS — BP 128/72 | HR 56 | Wt 210.1 lb

## 2023-03-09 DIAGNOSIS — N939 Abnormal uterine and vaginal bleeding, unspecified: Secondary | ICD-10-CM | POA: Diagnosis not present

## 2023-03-09 DIAGNOSIS — Z1331 Encounter for screening for depression: Secondary | ICD-10-CM

## 2023-03-09 NOTE — Progress Notes (Signed)
GYNECOLOGY VISIT  Patient name: Nancy Terry MRN [id number]  Date of birth: [birth year] Chief Complaint:   No chief complaint on file.   History:  Nancy Terry is a 47 y.o. G3P3000 being seen today for AUB.    Has not taken medication due to being unsure if she can take it with her medications. Also wanted to review her EMB results.   Hot flashes are daily. Reports having dizziness, weakness during menses during the first 3 days. Will have insomnia.  [facility] for [facility] and hear specialist Seeing a heart doctor for having bad shortness of breath  Had blood work done recenntly and wen tot heo hostpial and had blood work an Set designer for [employer] - went to [facility]  Has been told she needs to have blood transfusion but when she went to ED her blood count was not low enough to have it done   Her PCP and other specialists are through [facility]. States after getting new insurance, was assigned there but wants to bring care back to [facility]   Had a UTI and was treated Did boric acid supposirity which cleared vaginal issues  Still on methadone - 70mg  daily currently, currently being weaned off, 5mg  every 2 weeks   Past Medical History:  Diagnosis Date   Anxiety    Depression    Hepatitis C    Herpes infection    Hypertension    Kidney stones    Migraines    Narcotic abuse (HCC)     Past Surgical History:  Procedure Laterality Date   BREAST BIOPSY     ELBOW SURGERY Left    KIDNEY STONE SURGERY     with stent placement   LITHOTRIPSY     TUBAL LIGATION      The following portions of the patient's history were reviewed and updated as appropriate: allergies, current medications, past family history, past medical history, past social history, past surgical history and problem list.   Health Maintenance:   Last pap     Component Value Date/Time   DIAGPAP  03/30/2021 1108    - Negative for intraepithelial lesion or malignancy (NILM)   DIAGPAP  08/14/2019 1356    - Negative  for intraepithelial lesion or malignancy (NILM)   HPVHIGH Negative 03/30/2021 1108   HPVHIGH Positive (A) 08/14/2019 1356   ADEQPAP  03/30/2021 1108    Satisfactory for evaluation; transformation zone component PRESENT.   ADEQPAP  08/14/2019 1356    Satisfactory for evaluation; transformation zone component PRESENT.    High Risk HPV: Positive  Adequacy:  Satisfactory for evaluation, transformation zone component PRESENT  Diagnosis:  Atypical squamous cells of undetermined significance (ASC-US)  Last mammogram:    Review of Systems:  Pertinent items are noted in HPI. Comprehensive review of systems was otherwise negative.   Objective:  Physical Exam BP 128/72   Pulse (!) 56   Wt 210 lb 1.6 oz (95.3 kg)   BMI 33.91 kg/m    Physical Exam Vitals and nursing note reviewed.  Constitutional:      Appearance: Normal appearance.  HENT:     Head: Normocephalic and atraumatic.  Pulmonary:     Effort: Pulmonary effort is normal.  Skin:    General: Skin is warm and dry.  Neurological:     General: No focal deficit present.     Mental Status: She is alert.  Psychiatric:        Mood and Affect: Mood normal.  Behavior: Behavior normal.        Thought Content: Thought content normal.        Judgment: Judgment normal.      Labs and Imaging FINAL MICROSCOPIC DIAGNOSIS:   A. ENDOMETRIUM, BIOPSY:  -  Abundant blood and apparent weakly proliferative endometrium in the  background of breakdown.  -  Negative for atypia/hyperplasia.      Assessment & Plan:   1. Abnormal uterine bleeding (AUB) CBC today to assess severity of anemia (no access to [facility]) Request [facility], will have her complete ROI on checkout Trial of megace to control bleeding and emphasized importance of controlling bleeding to correct her anemia, especially if surgical intervention is pursued Briefly reviewed additional opitns for management including lng-iud and ablation Noted that  if hysterectomy perused, need clearance from cardiopulmonary specilaists  Currently weaning down on methadone;  Noted that she is perimenopausal given that she is still menstruating and having menopausal symptoms Will need to review labs or get updated lipid profile to asses ASCVD risk and if HRT reasonable vs nonhormonal options  - CBC   Routine preventative health maintenance measures emphasized.  Lorriane Shire, MD Minimally Invasive Gynecologic Surgery Center for [facility], [facility]

## 2023-03-09 NOTE — Patient Instructions (Addendum)
You are going to start taking the megace today to help with your bleeding.   Other options for bleeding include an IUD (intrauterine device), endometrial ablation, or hysterectomy. The IUD is the least invasive and the hysterectomy is the most invasive.   You are considered in menopause = 12 months without a period naturally.  "Post menopause" means you have more than 12 months out from a period "Pre menopause" means you are still having periods "Perimenopause" means you are having periods with menopause symptoms (like hot flashes, vaginal dryness, mood swings, decreased desire for sex, etc).   Records from [facility]  Blood test today to see how your anemia is doing

## 2023-03-10 ENCOUNTER — Other Ambulatory Visit: Payer: Self-pay | Admitting: Obstetrics and Gynecology

## 2023-03-10 DIAGNOSIS — D500 Iron deficiency anemia secondary to blood loss (chronic): Secondary | ICD-10-CM

## 2023-03-10 DIAGNOSIS — D649 Anemia, unspecified: Secondary | ICD-10-CM | POA: Insufficient documentation

## 2023-03-10 LAB — CBC
Hematocrit: 29.8 % — ABNORMAL LOW (ref 34.0–46.6)
Hemoglobin: 7.7 g/dL — ABNORMAL LOW (ref 11.1–15.9)
MCH: 17.2 pg — ABNORMAL LOW (ref 26.6–33.0)
MCHC: 25.8 g/dL — ABNORMAL LOW (ref 31.5–35.7)
MCV: 67 fL — ABNORMAL LOW (ref 79–97)
Platelets: 365 10*3/uL (ref 150–450)
RBC: 4.47 x10E6/uL (ref 3.77–5.28)
RDW: 18 % — ABNORMAL HIGH (ref 11.7–15.4)
WBC: 7 10*3/uL (ref 3.4–10.8)

## 2023-03-14 ENCOUNTER — Encounter: Payer: Self-pay | Admitting: Obstetrics and Gynecology

## 2023-03-14 ENCOUNTER — Other Ambulatory Visit: Payer: Self-pay

## 2023-03-14 ENCOUNTER — Telehealth: Payer: Self-pay

## 2023-03-14 DIAGNOSIS — D500 Iron deficiency anemia secondary to blood loss (chronic): Secondary | ICD-10-CM

## 2023-03-14 DIAGNOSIS — D509 Iron deficiency anemia, unspecified: Secondary | ICD-10-CM | POA: Insufficient documentation

## 2023-03-14 NOTE — Telephone Encounter (Signed)
Called patient at number listed in chart--verified with full name and DOB.   Reviewed provider results message; patient verified understanding and had no questions or concerns.   Maureen Ralphs RN on 03/14/23 at [employer]

## 2023-03-14 NOTE — Telephone Encounter (Signed)
-----   Message from Lorriane Shire sent at 03/10/2023 10:40 AM EDT ----- Review that she continues to have severe anemia. Will place order for iron transfusions and highly recommend taking megace so we can control her bleeding.

## 2023-03-16 ENCOUNTER — Telehealth: Payer: Self-pay | Admitting: Pharmacy Technician

## 2023-03-16 ENCOUNTER — Other Ambulatory Visit: Payer: Self-pay | Admitting: Family Medicine

## 2023-03-16 ENCOUNTER — Telehealth: Payer: Self-pay | Admitting: Obstetrics and Gynecology

## 2023-03-16 DIAGNOSIS — Z1231 Encounter for screening mammogram for malignant neoplasm of breast: Secondary | ICD-10-CM

## 2023-03-16 NOTE — Telephone Encounter (Signed)
Patient calling to get an update on her iron transfusions

## 2023-03-16 NOTE — Telephone Encounter (Signed)
Dr. Briscoe Deutscher, Fyi note:  Patient will be scheduled as soon as possible.  Auth Submission: NO AUTH NEEDED Site of care: Site of care: [facility] Payer: [employer] Medication & CPT/J Code(s) submitted: Venofer (Iron Sucrose) J1756 Route of submission (phone, fax, portal):  Phone # Fax # Auth type: Buy/Bill PB Units/visits requested: 5 Reference number:  Approval from: 03/16/23 to 06/07/23

## 2023-03-18 NOTE — Telephone Encounter (Signed)
Per chart review pt scheduled at [facility] for 03/28/23. Called pt who confirms she is aware of appts.

## 2023-03-28 ENCOUNTER — Ambulatory Visit: Payer: MEDICAID | Admitting: *Deleted

## 2023-03-28 VITALS — BP 113/71 | HR 53 | Temp 97.8°F | Resp 55 | Ht 63.0 in | Wt 218.0 lb

## 2023-03-28 DIAGNOSIS — D500 Iron deficiency anemia secondary to blood loss (chronic): Secondary | ICD-10-CM

## 2023-03-28 DIAGNOSIS — D509 Iron deficiency anemia, unspecified: Secondary | ICD-10-CM

## 2023-03-28 MED ORDER — SODIUM CHLORIDE 0.9 % IV SOLN
200.0000 mg | Freq: Once | INTRAVENOUS | Status: AC
  Administered 2023-03-28: 200 mg via INTRAVENOUS
  Filled 2023-03-28: qty 10

## 2023-03-28 MED ORDER — DIPHENHYDRAMINE HCL 25 MG PO CAPS
25.0000 mg | ORAL_CAPSULE | Freq: Once | ORAL | Status: AC
  Administered 2023-03-28: 25 mg via ORAL
  Filled 2023-03-28: qty 1

## 2023-03-28 MED ORDER — ACETAMINOPHEN 325 MG PO TABS
650.0000 mg | ORAL_TABLET | Freq: Once | ORAL | Status: DC
Start: 2023-03-28 — End: 2023-03-28

## 2023-03-28 NOTE — Patient Instructions (Signed)
Iron Sucrose Injection What is this medication? IRON SUCROSE (EYE ern SOO krose) treats low levels of iron (iron deficiency anemia) in people with kidney disease. Iron is a mineral that plays an important role in making red blood cells, which carry oxygen from your lungs to the rest of your body. This medicine may be used for other purposes; ask your health care provider or pharmacist if you have questions. COMMON BRAND NAME(S): Venofer What should I tell my care team before I take this medication? They need to know if you have any of these conditions: Anemia not caused by low iron levels Heart disease High levels of iron in the blood Kidney disease Liver disease An unusual or allergic reaction to iron, other medications, foods, dyes, or preservatives Pregnant or trying to get pregnant Breastfeeding How should I use this medication? This medication is for infusion into a vein. It is given in a hospital or clinic setting. Talk to your care team about the use of this medication in children. While this medication may be prescribed for children as young as 2 years for selected conditions, precautions do apply. Overdosage: If you think you have taken too much of this medicine contact a poison control center or emergency room at once. NOTE: This medicine is only for you. Do not share this medicine with others. What if I miss a dose? Keep appointments for follow-up doses. It is important not to miss your dose. Call your care team if you are unable to keep an appointment. What may interact with this medication? Do not take this medication with any of the following: Deferoxamine Dimercaprol Other iron products This medication may also interact with the following: Chloramphenicol Deferasirox This list may not describe all possible interactions. Give your health care provider a list of all the medicines, herbs, non-prescription drugs, or dietary supplements you use. Also tell them if you smoke,  drink alcohol, or use illegal drugs. Some items may interact with your medicine. What should I watch for while using this medication? Visit your care team regularly. Tell your care team if your symptoms do not start to get better or if they get worse. You may need blood work done while you are taking this medication. You may need to follow a special diet. Talk to your care team. Foods that contain iron include: whole grains/cereals, dried fruits, beans, or peas, leafy green vegetables, and organ meats (liver, kidney). What side effects may I notice from receiving this medication? Side effects that you should report to your care team as soon as possible: Allergic reactions--skin rash, itching, hives, swelling of the face, lips, tongue, or throat Low blood pressure--dizziness, feeling faint or lightheaded, blurry vision Shortness of breath Side effects that usually do not require medical attention (report to your care team if they continue or are bothersome): Flushing Headache Joint pain Muscle pain Nausea Pain, redness, or irritation at injection site This list may not describe all possible side effects. Call your doctor for medical advice about side effects. You may report side effects to FDA at 1-800-FDA-1088. Where should I keep my medication? This medication is given in a hospital or clinic. It will not be stored at home. NOTE: This sheet is a summary. It may not cover all possible information. If you have questions about this medicine, talk to your doctor, pharmacist, or health care provider.  2024 Elsevier/Gold Standard (2022-10-29 00:00:00)

## 2023-03-28 NOTE — Progress Notes (Signed)
Diagnosis: Iron Deficiency Anemia  Provider:  Chilton Greathouse MD  Procedure: IV Infusion  IV Type: Peripheral, IV Location: L Hand  Venofer (Iron Sucrose), Dose: 200 mg  Infusion Start Time: 1430 pm  Infusion Stop Time: 1447 pm  Post Infusion IV Care: Observation period completed and Peripheral IV Discontinued  Discharge: Condition: Good, Destination: Home . AVS Provided  Performed by:  Forrest Moron, RN

## 2023-03-30 ENCOUNTER — Ambulatory Visit: Payer: MEDICAID

## 2023-03-30 VITALS — BP 131/77 | HR 54 | Temp 98.3°F | Resp 16 | Ht 63.0 in | Wt 218.4 lb

## 2023-03-30 DIAGNOSIS — D509 Iron deficiency anemia, unspecified: Secondary | ICD-10-CM | POA: Diagnosis not present

## 2023-03-30 DIAGNOSIS — D500 Iron deficiency anemia secondary to blood loss (chronic): Secondary | ICD-10-CM

## 2023-03-30 MED ORDER — DIPHENHYDRAMINE HCL 25 MG PO CAPS
25.0000 mg | ORAL_CAPSULE | Freq: Once | ORAL | Status: AC
Start: 2023-03-30 — End: 2023-03-30
  Administered 2023-03-30: 25 mg via ORAL
  Filled 2023-03-30: qty 1

## 2023-03-30 MED ORDER — ACETAMINOPHEN 325 MG PO TABS
650.0000 mg | ORAL_TABLET | Freq: Once | ORAL | Status: AC
  Administered 2023-03-30: 650 mg via ORAL
  Filled 2023-03-30: qty 2

## 2023-03-30 MED ORDER — IRON SUCROSE 20 MG/ML IV SOLN
200.0000 mg | Freq: Once | INTRAVENOUS | Status: AC
  Administered 2023-03-30: 200 mg via INTRAVENOUS
  Filled 2023-03-30: qty 10

## 2023-03-30 NOTE — Progress Notes (Signed)
Diagnosis: Iron Deficiency Anemia  Provider:  Chilton Greathouse MD  Procedure: IV Infusion  IV Type: Peripheral, IV Location: R Forearm  Venofer (Iron Sucrose), Dose: 200 mg  Infusion Start Time: 1604  Infusion Stop Time: 1610  Post Infusion IV Care: Observation period completed and Peripheral IV Discontinued  Discharge: Condition: Good, Destination: Home . AVS Provided  Performed by:  Loney Hering, LPN

## 2023-04-01 ENCOUNTER — Ambulatory Visit (INDEPENDENT_AMBULATORY_CARE_PROVIDER_SITE_OTHER): Payer: No Typology Code available for payment source

## 2023-04-01 VITALS — BP 124/72 | HR 55 | Temp 99.2°F | Resp 16 | Ht 63.0 in | Wt 214.2 lb

## 2023-04-01 DIAGNOSIS — D500 Iron deficiency anemia secondary to blood loss (chronic): Secondary | ICD-10-CM

## 2023-04-01 DIAGNOSIS — D509 Iron deficiency anemia, unspecified: Secondary | ICD-10-CM

## 2023-04-01 MED ORDER — IRON SUCROSE 20 MG/ML IV SOLN
200.0000 mg | Freq: Once | INTRAVENOUS | Status: AC
  Administered 2023-04-01: 200 mg via INTRAVENOUS
  Filled 2023-04-01: qty 10

## 2023-04-01 MED ORDER — ACETAMINOPHEN 325 MG PO TABS
650.0000 mg | ORAL_TABLET | Freq: Once | ORAL | Status: AC
  Administered 2023-04-01: 650 mg via ORAL
  Filled 2023-04-01: qty 2

## 2023-04-01 MED ORDER — DIPHENHYDRAMINE HCL 25 MG PO CAPS
25.0000 mg | ORAL_CAPSULE | Freq: Once | ORAL | Status: AC
  Administered 2023-04-01: 25 mg via ORAL
  Filled 2023-04-01: qty 1

## 2023-04-01 NOTE — Progress Notes (Signed)
Diagnosis: Iron Deficiency Anemia  Provider:  Chilton Greathouse MD  Procedure: IV Infusion  IV Type: Peripheral, IV Location: R Hand  Venofer (Iron Sucrose), Dose: 200 mg  Infusion Start Time: 1546  Infusion Stop Time: 1555  Post Infusion IV Care: Observation period completed and Peripheral IV Discontinued  Discharge: Condition: Good, Destination: Home . AVS Provided  Performed by:  Rico Ala, LPN

## 2023-04-04 ENCOUNTER — Ambulatory Visit: Payer: No Typology Code available for payment source

## 2023-04-04 MED ORDER — IRON SUCROSE 20 MG/ML IV SOLN
200.0000 mg | Freq: Once | INTRAVENOUS | Status: DC
Start: 2023-04-04 — End: 2023-04-04

## 2023-04-04 MED ORDER — DIPHENHYDRAMINE HCL 25 MG PO CAPS: 25.0000 mg | ORAL_CAPSULE | Freq: Once | ORAL | Status: DC

## 2023-04-04 MED ORDER — ACETAMINOPHEN 325 MG PO TABS
650.0000 mg | ORAL_TABLET | Freq: Once | ORAL | Status: DC
Start: 2023-04-04 — End: 2023-04-12

## 2023-04-06 ENCOUNTER — Ambulatory Visit: Payer: No Typology Code available for payment source

## 2023-04-06 MED ORDER — IRON SUCROSE 20 MG/ML IV SOLN: 200.0000 mg | Freq: Once | INTRAVENOUS | Status: DC

## 2023-04-06 MED ORDER — ACETAMINOPHEN 325 MG PO TABS: 650.0000 mg | ORAL_TABLET | Freq: Once | ORAL | Status: DC

## 2023-04-06 MED ORDER — DIPHENHYDRAMINE HCL 25 MG PO CAPS: 25.0000 mg | ORAL_CAPSULE | Freq: Once | ORAL | Status: DC

## 2023-04-08 ENCOUNTER — Ambulatory Visit: Payer: No Typology Code available for payment source

## 2023-04-12 ENCOUNTER — Encounter: Payer: Self-pay | Admitting: Obstetrics and Gynecology

## 2023-04-13 ENCOUNTER — Ambulatory Visit: Payer: No Typology Code available for payment source

## 2023-04-13 MED ORDER — DIPHENHYDRAMINE HCL 25 MG PO CAPS: 25.0000 mg | ORAL_CAPSULE | Freq: Once | ORAL | Status: DC

## 2023-04-13 MED ORDER — ACETAMINOPHEN 325 MG PO TABS: 650.0000 mg | ORAL_TABLET | Freq: Once | ORAL | Status: DC

## 2023-04-13 MED ORDER — IRON SUCROSE 20 MG/ML IV SOLN
200.0000 mg | Freq: Once | INTRAVENOUS | Status: DC
Start: 2023-04-13 — End: 2023-04-13

## 2023-04-15 ENCOUNTER — Ambulatory Visit: Payer: MEDICAID

## 2023-04-15 VITALS — BP 104/57 | HR 65 | Temp 98.5°F | Resp 16 | Ht 63.0 in | Wt 220.8 lb

## 2023-04-15 DIAGNOSIS — D509 Iron deficiency anemia, unspecified: Secondary | ICD-10-CM | POA: Diagnosis not present

## 2023-04-15 DIAGNOSIS — D500 Iron deficiency anemia secondary to blood loss (chronic): Secondary | ICD-10-CM

## 2023-04-15 MED ORDER — IRON SUCROSE 20 MG/ML IV SOLN
200.0000 mg | Freq: Once | INTRAVENOUS | Status: AC
  Administered 2023-04-15: 200 mg via INTRAVENOUS
  Filled 2023-04-15: qty 10

## 2023-04-15 NOTE — Progress Notes (Signed)
Diagnosis: Iron Deficiency Anemia  Provider:  Chilton Greathouse MD  Procedure: IV Push  IV Type: Peripheral, IV Location: R Hand  Venofer (Iron Sucrose), Dose: 200 mg  Post Infusion IV Care: Observation period completed and Peripheral IV Discontinued  Discharge: Condition: Good, Destination: Home . AVS Provided  Performed by:  Nat Math, RN

## 2023-04-18 NOTE — Telephone Encounter (Signed)
error 

## 2023-04-19 MED ORDER — DIPHENHYDRAMINE HCL 25 MG PO CAPS: 25.0000 mg | ORAL_CAPSULE | Freq: Once | ORAL | Status: DC

## 2023-04-19 MED ORDER — ACETAMINOPHEN 325 MG PO TABS: 650.0000 mg | ORAL_TABLET | Freq: Once | ORAL | Status: DC

## 2023-04-19 MED ORDER — IRON SUCROSE 20 MG/ML IV SOLN: 200.0000 mg | Freq: Once | INTRAVENOUS | Status: DC

## 2023-04-20 ENCOUNTER — Encounter: Payer: Self-pay | Admitting: Obstetrics and Gynecology

## 2023-04-20 ENCOUNTER — Ambulatory Visit
Admission: RE | Admit: 2023-04-20 | Discharge: 2023-04-20 | Disposition: A | Discharge status: Home / Self Care | Payer: MEDICAID | Source: Ambulatory Visit | Attending: Family Medicine | Admitting: Family Medicine

## 2023-04-20 DIAGNOSIS — Z1231 Encounter for screening mammogram for malignant neoplasm of breast: Secondary | ICD-10-CM

## 2023-04-21 ENCOUNTER — Ambulatory Visit: Payer: MEDICAID | Admitting: Obstetrics and Gynecology

## 2023-04-27 MED ORDER — IRON SUCROSE 20 MG/ML IV SOLN: 200.0000 mg | Freq: Once | INTRAVENOUS | Status: DC

## 2023-04-27 MED ORDER — ACETAMINOPHEN 325 MG PO TABS: 650.0000 mg | ORAL_TABLET | Freq: Once | ORAL | Status: DC

## 2023-04-27 MED ORDER — DIPHENHYDRAMINE HCL 25 MG PO CAPS: 25.0000 mg | ORAL_CAPSULE | Freq: Once | ORAL | Status: DC

## 2023-05-12 ENCOUNTER — Encounter: Payer: Self-pay | Admitting: Obstetrics and Gynecology

## 2023-05-12 ENCOUNTER — Ambulatory Visit: Payer: MEDICAID | Admitting: Obstetrics and Gynecology

## 2023-05-12 VITALS — BP 114/81 | HR 64 | Wt 208.3 lb

## 2023-05-12 DIAGNOSIS — R238 Other skin changes: Secondary | ICD-10-CM

## 2023-05-12 DIAGNOSIS — N939 Abnormal uterine and vaginal bleeding, unspecified: Secondary | ICD-10-CM | POA: Diagnosis not present

## 2023-05-12 DIAGNOSIS — Z1331 Encounter for screening for depression: Secondary | ICD-10-CM | POA: Diagnosis not present

## 2023-05-12 NOTE — Progress Notes (Signed)
GYNECOLOGY VISIT  Patient name: Nancy Terry MRN [id number]  Date of birth: [birth year] Chief Complaint:   Follow-up  History:  Nancy Terry is a 47 y.o. G3P3000 being seen today for AUB follow up.   Heart and lung doctor for shortness of breath - has had a workup and it was negative. Had an echo and other  Still smoking - down to pack a week instead of a pack of day  "Husband"  is long tie friend, bestie, roommate Bleeding and cramping were worse with megace - took it for 2 weeks following last visit and stopped due to worsening symptoms  Uses boric acid as needed - whenever she is having some vaginal discharge Takes anxiety meds at bedtime nightly Not currently sexually active for about 14 years and has never been a tampon use (doesn't like it).   Notes having "blisters" on her lower extremities and skin folds including possible groin  Past Medical History:  Diagnosis Date   Anxiety    Depression    Hepatitis C    Herpes infection    Hypertension    Kidney stones    Migraines    Narcotic abuse (HCC)     Past Surgical History:  Procedure Laterality Date   BREAST BIOPSY     ELBOW SURGERY Left    KIDNEY STONE SURGERY     with stent placement   LITHOTRIPSY     TUBAL LIGATION      The following portions of the patient's history were reviewed and updated as appropriate: allergies, current medications, past family history, past medical history, past social history, past surgical history and problem list.   Health Maintenance:   Last pap     Component Value Date/Time   DIAGPAP  03/30/2021 1108    - Negative for intraepithelial lesion or malignancy (NILM)   DIAGPAP  08/14/2019 1356    - Negative for intraepithelial lesion or malignancy (NILM)   HPVHIGH Negative 03/30/2021 1108   HPVHIGH Positive (A) 08/14/2019 1356   ADEQPAP  03/30/2021 1108    Satisfactory for evaluation; transformation zone component PRESENT.   ADEQPAP  08/14/2019 1356    Satisfactory for  evaluation; transformation zone component PRESENT.    High Risk HPV: Positive  Adequacy:  Satisfactory for evaluation, transformation zone component PRESENT  Diagnosis:  Atypical squamous cells of undetermined significance (ASC-US)  Last mammogram: 04/2023 BIRADS 1   Review of Systems:  Pertinent items are noted in HPI. Comprehensive review of systems was otherwise negative.   Objective:  Physical Exam BP 114/81   Pulse 64   Wt 208 lb 5 oz (94.5 kg)   BMI 36.90 kg/m    Physical Exam Vitals and nursing note reviewed. Exam conducted with a chaperone present.  Constitutional:      Appearance: Normal appearance.  HENT:     Head: Normocephalic and atraumatic.  Pulmonary:     Effort: Pulmonary effort is normal.     Breath sounds: Normal breath sounds.  Genitourinary:    General: Normal vulva.     Exam position: Lithotomy position.     Vagina: Normal.     Cervix: Normal.     Comments: Nodule on upper, anterior thigh with mild erythema and tenderness to palpation  Skin:    General: Skin is warm and dry.  Neurological:     General: No focal deficit present.     Mental Status: She is alert.  Psychiatric:        Mood and  Affect: Mood normal.        Behavior: Behavior normal.        Thought Content: Thought content normal.        Judgment: Judgment normal.       Assessment & Plan:   1. Abnormal uterine bleeding (AUB) Repeat CBC given completion of 4 of iron transfusions. Discussed surgical management of bleeding options including endometrial ablation and hysterectomy. Discussed risks and benefits of both procedures.  Reviewed that if patient should undergo endometrial ablation first, and it is a failure, patient able to undergo hysterectomy for definitive management.  Noted the patient does not currently have any cardiac or pulmonary diagnosis, was simply seen by specialist for workup which she reports were negative.  Patient notes that she did not have improvement with  Megace, will not continue.  Will check CBC today due to now completing - CBC - Ambulatory Referral For Surgery Scheduling  2. Skin irritation Recommend follow up with primary care.   Routine preventative health maintenance measures emphasized.  Lorriane Shire, MD Minimally Invasive Gynecologic Surgery Center for [facility], [facility]

## 2023-05-13 LAB — CBC
Hematocrit: 40.9 % (ref 34.0–46.6)
Hemoglobin: 11.9 g/dL (ref 11.1–15.9)
MCH: 22.8 pg — ABNORMAL LOW (ref 26.6–33.0)
MCHC: 29.1 g/dL — ABNORMAL LOW (ref 31.5–35.7)
MCV: 78 fL — ABNORMAL LOW (ref 79–97)
Platelets: 306 10*3/uL (ref 150–450)
RBC: 5.23 x10E6/uL (ref 3.77–5.28)
RDW: 23.6 % — ABNORMAL HIGH (ref 11.7–15.4)
WBC: 6.9 10*3/uL (ref 3.4–10.8)

## 2023-05-16 ENCOUNTER — Ambulatory Visit: Payer: MEDICAID

## 2023-05-16 ENCOUNTER — Telehealth: Payer: Self-pay | Admitting: Obstetrics and Gynecology

## 2023-05-16 ENCOUNTER — Telehealth: Payer: Self-pay

## 2023-05-16 DIAGNOSIS — R8290 Unspecified abnormal findings in urine: Secondary | ICD-10-CM

## 2023-05-16 DIAGNOSIS — N898 Other specified noninflammatory disorders of vagina: Secondary | ICD-10-CM

## 2023-05-16 DIAGNOSIS — R300 Dysuria: Secondary | ICD-10-CM

## 2023-05-16 LAB — POCT URINALYSIS DIP (DEVICE)
Glucose, UA: NEGATIVE mg/dL
Ketones, ur: NEGATIVE mg/dL
Leukocytes,Ua: NEGATIVE
Nitrite: POSITIVE — AB
Protein, ur: 100 mg/dL — AB
Specific Gravity, Urine: >=1.03 (ref 1.005–1.030)
Urobilinogen, UA: 1 mg/dL (ref 0.0–1.0)
pH: 6 (ref 5.0–8.0)

## 2023-05-16 MED ORDER — FLUCONAZOLE 150 MG PO TABS: 150.0000 mg | ORAL_TABLET | Freq: Once | ORAL | 0 refills | Status: AC

## 2023-05-16 MED ORDER — NITROFURANTOIN MONOHYD MACRO 100 MG PO CAPS: 100.0000 mg | ORAL_CAPSULE | Freq: Two times a day (BID) | ORAL | 0 refills | Status: DC

## 2023-05-16 NOTE — Telephone Encounter (Addendum)
-----   Message from Troy sent at 05/13/2023  8:29 AM EST ----- Notify that anemia has improved - will try to get her endometrial ablation scheduled to help her bleeding  Pt notified of results.  Pt reports that she is having burning with urination.  I advised pt that she can come and leave a urine sample to rule out UTI and we can call her with results.  Pt stated that she is also having a lot of vaginal bleeding.  Pt states that she wears a thin pad and has to change often.  I informed her that the provider has sent a message to the surgery scheduler to schedule ablation to help with bleeding.  I recommended pt to utilize a pad according to her flow of period to see if that helps with frequent changing.  Pt verbalized understanding with no further questions.   Leonette Nutting

## 2023-05-16 NOTE — Progress Notes (Signed)
   10 today  Changing at most every 30 minutes   Nauseas, sick to stomach, headache, hot flashes, sweating  Took AZO -- made it bleeding worse, bleeding   Marjo Bicker, RN 05/16/2023 5:09 PM

## 2023-05-16 NOTE — Telephone Encounter (Signed)
Patient concern addressed in other encounter.    Nancy Terry

## 2023-05-16 NOTE — Telephone Encounter (Signed)
Patient state she is having itching, burning and bleeding. Want to speak with a nurse, Experiencing this for the past couple of days.

## 2023-05-18 LAB — URINE CULTURE

## 2023-05-19 ENCOUNTER — Telehealth: Payer: Self-pay

## 2023-05-19 ENCOUNTER — Telehealth: Payer: Self-pay | Admitting: *Deleted

## 2023-05-19 NOTE — Telephone Encounter (Addendum)
-----   Message from Federico Flake sent at 05/18/2023  5:27 PM EST ----- UTI present and macrobid should treat. Please call patient and check in about sx. If having fevers needs to go to the hospital  12/12  1410 Called pt and informed her of test result confirming UTI. She should take Macrobid as prescribed on 12/9 until all medication is gone. Pt stated that she has been taking it as prescribed. Pt was asked if she has fever and she stated that she does have some hot flashes but they are much less frequent than several days ago. She was advised to go to Urgent Care if she has increased symptoms of fever (chills, feeling hot). Pt stated she is constipated. I advised to take OTC Senna (which she has) per package directions and to continue increased po fluids. She voiced understanding of all information and instructions given.

## 2023-05-19 NOTE — Telephone Encounter (Signed)
Called patient to schedule surgery w/ Dr. Briscoe Deutscher. Patient states she in the car and would call me back to schedule.

## 2023-05-26 ENCOUNTER — Telehealth: Payer: Self-pay | Admitting: Family Medicine

## 2023-05-26 ENCOUNTER — Telehealth: Payer: Self-pay

## 2023-05-26 NOTE — Telephone Encounter (Signed)
Opened in error

## 2023-05-26 NOTE — Telephone Encounter (Signed)
Patient called wanting to set up a consultation with Dr.Ajewole before scheduling a surgery with her. She says she is still having problems with a UTI, antibiotics does not seem to be working and she would like for a nurse to give her a call back because her f/u appt is not until 06/30/23. Call back number was confirmed.

## 2023-05-26 NOTE — Telephone Encounter (Signed)
Called and spoke with patient. Patient stated that her UTI has cleared up but she was unable to void when down to her last 3 pills. Patient stated she voided all day 05/25/23 and 05/26/23. Stated everything was okay with her UTI now and she wanted a appointment to see the provider before the surgery. Discussed keeping the appointment for 06/30/2023 and provided her with number to call to get her surgery scheduled. Patient verified understanding.   B'Aisha, CMA

## 2023-06-15 ENCOUNTER — Encounter (HOSPITAL_BASED_OUTPATIENT_CLINIC_OR_DEPARTMENT_OTHER): Payer: MEDICAID | Attending: Physician Assistant | Admitting: Internal Medicine

## 2023-06-15 ENCOUNTER — Telehealth: Payer: Self-pay

## 2023-06-15 DIAGNOSIS — G8929 Other chronic pain: Secondary | ICD-10-CM | POA: Diagnosis not present

## 2023-06-15 DIAGNOSIS — Z79891 Long term (current) use of opiate analgesic: Secondary | ICD-10-CM | POA: Insufficient documentation

## 2023-06-15 DIAGNOSIS — I10 Essential (primary) hypertension: Secondary | ICD-10-CM | POA: Diagnosis not present

## 2023-06-15 DIAGNOSIS — I872 Venous insufficiency (chronic) (peripheral): Secondary | ICD-10-CM

## 2023-06-15 DIAGNOSIS — Y93K1 Activity, walking an animal: Secondary | ICD-10-CM | POA: Insufficient documentation

## 2023-06-15 DIAGNOSIS — B182 Chronic viral hepatitis C: Secondary | ICD-10-CM | POA: Insufficient documentation

## 2023-06-15 DIAGNOSIS — W19XXXA Unspecified fall, initial encounter: Secondary | ICD-10-CM | POA: Insufficient documentation

## 2023-06-15 DIAGNOSIS — G43909 Migraine, unspecified, not intractable, without status migrainosus: Secondary | ICD-10-CM | POA: Diagnosis not present

## 2023-06-15 NOTE — Telephone Encounter (Signed)
 Reached out to patient to see if she was available on 06/29/23 for surgery w/ Dr. Briscoe Deutscher. Patient prefers 07/18/23 @[facility]  at 10:30 am. Pre-op instructions and surgery details were provided by phone. Patient confirmed understanding.

## 2023-06-16 NOTE — Progress Notes (Signed)
 Nancy Terry ([id number]) [id number] Page 1 of 7 Visit Report for 06/15/2023 Allergy List Details Patient Name: Date of Service: Nancy Terry, Nancy Terry 06/15/2023 12:45 PM Medical Record Number: [id number] Patient Account Number: [id number] Date of Birth/Sex: Treating RN: Apr 28, 1976 (48 y.o. JEANELL Drury Nestle Primary Care Auset Fritzler: Delbert Clam Other Clinician: Referring Garry Nicolini: Treating Elka Satterfield/Extender: Rosan Harlene Delbert Clam Weeks in Treatment: 0 Allergies Active Allergies Haldol Reaction: throat swelling, dizzy Allergy Notes Electronic Signature(s) Signed: 06/15/2023 5:28:22 PM By: Drury Nestle RN, BSN Entered By: Drury Nestle on 06/15/2023 13:01:47 -------------------------------------------------------------------------------- Arrival Information Details Patient Name: Date of Service: Nancy Terry 06/15/2023 12:45 PM Medical Record Number: [id number] Patient Account Number: [id number] Date of Birth/Sex: Treating RN: 1976/01/05 (48 y.o. JEANELL Drury, Geminus.gell Primary Care Tkeyah Burkman: Delbert Clam Other Clinician: Referring Fatin Bachicha: Treating Cain Fitzhenry/Extender: Rosan Harlene Delbert Clam Weeks in Treatment: 0 Visit Information Patient Arrived: Ambulatory Arrival Time: 12:50 Accompanied By: self Transfer Assistance: None Patient Identification Verified: Yes Secondary Verification Process Completed: Yes Patient Requires Transmission-Based Precautions: No Patient Has Alerts: No History Since Last Visit Added or deleted any medications: No Any new allergies or adverse reactions: No Had a fall or experienced change in activities of daily living that may affect risk of falls: No Signs or symptoms of abuse/neglect since last visito No Hospitalized since last visit: No Implantable device outside of the clinic excluding cellular tissue based products placed in the center since last visit: No Has Compression in Place as Prescribed: No Electronic  Signature(s) Signed: 06/15/2023 5:28:22 PM By: Drury Nestle RN, BSN Entered By: Drury Nestle on 06/15/2023 13:21:17 Schultes, Bristyn ([id number]) [id number] Page 2 of 7 -------------------------------------------------------------------------------- Clinic Level of Care Assessment Details Patient Name: Date of Service: Nancy Terry 06/15/2023 12:45 PM Medical Record Number: [id number] Patient Account Number: [id number] Date of Birth/Sex: Treating RN: 1976-02-23 (48 y.o. JEANELL Drury Nestle Primary Care Chatham Howington: Delbert Clam Other Clinician: Referring Reniya Mcclees: Treating Jonathin Heinicke/Extender: Rosan Harlene Delbert Clam Weeks in Treatment: 0 Clinic Level of Care Assessment Items TOOL 2 Quantity Score X- 1 0 Use when only an EandM is performed on the INITIAL visit ASSESSMENTS - Nursing Assessment / Reassessment X- 1 20 General Physical Exam (combine w/ comprehensive assessment (listed just below) when performed on new pt. evals) X- 1 25 Comprehensive Assessment (HX, ROS, Risk Assessments, Wounds Hx, etc.) ASSESSMENTS - Wound and Skin A ssessment / Reassessment []  - 0 Simple Wound Assessment / Reassessment - one wound []  - 0 Complex Wound Assessment / Reassessment - multiple wounds X- 1 10 Dermatologic / Skin Assessment (not related to wound area) ASSESSMENTS - Ostomy and/or Continence Assessment and Care []  - 0 Incontinence Assessment and Management []  - 0 Ostomy Care Assessment and Management (repouching, etc.) PROCESS - Coordination of Care X - Simple Patient / Family Education for ongoing care 1 15 []  - 0 Complex (extensive) Patient / Family Education for ongoing care X- 1 10 Staff obtains Chiropractor, Records, T Results / Process Orders est []  - 0 Staff telephones HHA, Nursing Homes / Clarify orders / etc []  - 0 Routine Transfer to another Facility (non-emergent condition) []  - 0 Routine Hospital Admission (non-emergent condition) []  - 0 New  Admissions / Manufacturing Engineer / Ordering NPWT Apligraf, etc. , []  - 0 Emergency Hospital Admission (emergent condition) X- 1 10 Simple Discharge Coordination []  - 0 Complex (extensive) Discharge Coordination PROCESS - Special Needs []  - 0 Pediatric / Minor Patient Management []  - 0 Isolation Patient Management []  - 0 Hearing / Language / Visual special  needs []  - 0 Assessment of Community assistance (transportation, D/C planning, etc.) []  - 0 Additional assistance / Altered mentation []  - 0 Support Surface(s) Assessment (bed, cushion, seat, etc.) INTERVENTIONS - Wound Cleansing / Measurement []  - 0 Wound Imaging (photographs - any number of wounds) []  - 0 Wound Tracing (instead of photographs) []  - 0 Simple Wound Measurement - one wound []  - 0 Complex Wound Measurement - multiple wounds []  - 0 Simple Wound Cleansing - one wound [place], Nancy Terry ([id number]) [id number] Page 3 of 7 []  - 0 Complex Wound Cleansing - multiple wounds INTERVENTIONS - Wound Dressings []  - 0 Small Wound Dressing one or multiple wounds []  - 0 Medium Wound Dressing one or multiple wounds []  - 0 Large Wound Dressing one or multiple wounds []  - 0 Application of Medications - injection INTERVENTIONS - Miscellaneous []  - 0 External ear exam []  - 0 Specimen Collection (cultures, biopsies, blood, body fluids, etc.) []  - 0 Specimen(s) / Culture(s) sent or taken to Lab for analysis []  - 0 Patient Transfer (multiple staff / Deitra Lift / Similar devices) []  - 0 Simple Staple / Suture removal (25 or less) []  - 0 Complex Staple / Suture removal (26 or more) []  - 0 Hypo / Hyperglycemic Management (close monitor of Blood Glucose) []  - 0 Ankle / Brachial Index (ABI) - do not check if billed separately Has the patient been seen at the hospital within the last three years: Yes Total Score: 90 Level Of Care: New/Established - Level 3 Electronic Signature(s) Signed:  06/15/2023 5:28:22 PM By: Drury Nestle RN, BSN Entered By: Drury Nestle on 06/15/2023 13:24:30 -------------------------------------------------------------------------------- Encounter Discharge Information Details Patient Name: Date of Service: RAYLYNNE, CUBBAGE 06/15/2023 12:45 PM Medical Record Number: [id number] Patient Account Number: [id number] Date of Birth/Sex: Treating RN: 02-05-1976 (48 y.o. JEANELL Drury Nestle Primary Care Janila Arrazola: Delbert Clam Other Clinician: Referring Denai Caba: Treating Orlean Holtrop/Extender: Rosan Harlene Delbert Clam Weeks in Treatment: 0 Encounter Discharge Information Items Discharge Condition: Stable Ambulatory Status: Ambulatory Discharge Destination: Home Transportation: Private Auto Accompanied By: self Schedule Follow-up Appointment: No Clinical Summary of Care: Electronic Signature(s) Signed: 06/15/2023 5:28:22 PM By: Drury Nestle RN, BSN Entered By: Drury Nestle on 06/15/2023 13:24:51 -------------------------------------------------------------------------------- Lower Extremity Assessment Details Patient Name: Date of Service: CARRIEANN, SPIELBERG 06/15/2023 12:45 PM Morath, Amada ([id number]) [id number] Page 4 of 7 Medical Record Number: [id number] Patient Account Number: [id number] Date of Birth/Sex: Treating RN: 16-Jan-1976 (48 y.o. JEANELL Drury, Geminus.gell Primary Care Ryelee Albee: Delbert Clam Other Clinician: Referring Eleazar Kimmey: Treating Jaquane Boughner/Extender: Rosan Harlene Delbert Clam Weeks in Treatment: 0 Edema Assessment Assessed: [Left: Yes] [Right: Yes] Edema: [Left: Yes] [Right: Yes] Calf Left: Right: Point of Measurement: 30 cm From Medial Instep 40.5 cm 41 cm Ankle Left: Right: Point of Measurement: 9 cm From Medial Instep 22.3 cm 23 cm Knee To Floor Left: Right: From Medial Instep 39 cm 39 cm Vascular Assessment Pulses: Dorsalis Pedis Palpable: [Left:Yes] [Right:Yes] Extremity colors, hair growth,  and conditions: Extremity Color: [Left:Hyperpigmented] [Right:Hyperpigmented] Hair Growth on Extremity: [Left:Yes] [Right:Yes] Temperature of Extremity: [Left:Warm] [Right:Warm] Capillary Refill: [Left:< 3 seconds] [Right:< 3 seconds] Dependent Rubor: [Left:No] [Right:No] Blanched when Elevated: [Left:No Yes] [Right:No Yes] Toe Nail Assessment Left: Right: Thick: Yes Yes Discolored: No No Deformed: No No Improper Length and Hygiene: No No Electronic Signature(s) Signed: 06/15/2023 5:28:22 PM By: Drury Nestle RN, BSN Entered By: Drury Nestle on 06/15/2023 13:00:57 -------------------------------------------------------------------------------- Multi Wound Chart Details Patient Name: Date of Service: HERSHEL HUGHIE SAILOR 06/15/2023 12:45 PM Medical Record Number:  [id number] Patient Account Number: [id number] Date of Birth/Sex: Treating RN: Oct 12, 1975 (48 y.o. F) Primary Care Ishan Sanroman: Delbert Clam Other Clinician: Referring Tarryn Bogdan: Treating Latresa Gasser/Extender: Rosan Harlene Delbert Clam Weeks in Treatment: 0 Vital Signs Height(in): 64 Pulse(bpm): 50 Weight(lbs): 223 Blood Pressure(mmHg): 124/78 Body Mass Index(BMI): 38.3 Temperature(F): 97.9 Respiratory Rate(breaths/min): 16 Mccart, Bettye ([id number]) [id number].pdf Page 5 of 7 [Treatment Notes:Wound Assessments Treatment Notes] Electronic Signature(s) Signed: 06/15/2023 5:26:35 PM By: Rosan Harlene DO Entered By: Rosan Harlene on 06/15/2023 14:43:41 -------------------------------------------------------------------------------- Multi-Disciplinary Care Plan Details Patient Name: Date of Service: RAMAH, LANGHANS 06/15/2023 12:45 PM Medical Record Number: [id number] Patient Account Number: [id number] Date of Birth/Sex: Treating RN: 10-Dec-1975 (48 y.o. JEANELL Drury Nestle Primary Care Dameir Gentzler: Delbert Clam Other Clinician: Referring Cydne Grahn: Treating Tyeesha Riker/Extender: Rosan Harlene Delbert Clam Weeks in Treatment: 0 Active Inactive Electronic Signature(s) Signed: 06/15/2023 5:28:22 PM By: Drury Nestle RN, BSN Entered By: Drury Nestle on 06/15/2023 13:23:47 -------------------------------------------------------------------------------- Pain Assessment Details Patient Name: Date of Service: LTANYA, BAYLEY 06/15/2023 12:45 PM Medical Record Number: [id number] Patient Account Number: [id number] Date of Birth/Sex: Treating RN: May 21, 1976 (48 y.o. JEANELL Drury Nestle Primary Care Ladonte Verstraete: Delbert Clam Other Clinician: Referring Worthington Cruzan: Treating Farryn Linares/Extender: Rosan Harlene Delbert Clam Weeks in Treatment: 0 Active Problems Location of Pain Severity and Description of Pain Patient Has Paino Yes Site Locations Rate the pain. Current Pain Level: 6 Character of Pain Wynns, Yaelis ([id number]) [id number] Page 6 of 7 Describe the Pain: Heavy Pain Management and Medication Current Pain Management: Medication: No Cold Application: No Rest: No Massage: No Activity: No T.E.N.S.: No Heat Application: No Leg drop or elevation: No Is the Current Pain Management Adequate: Adequate How does your wound impact your activities of daily livingo Sleep: No Bathing: No Appetite: No Relationship With Others: No Bladder Continence: No Emotions: No Bowel Continence: No Work: No Toileting: No Drive: No Dressing: No Hobbies: No Psychologist, [employer]) Signed: 06/15/2023 5:28:22 PM By: Drury Nestle RN, BSN Entered By: Drury Nestle on 06/15/2023 12:56:45 -------------------------------------------------------------------------------- Patient/Caregiver Education Details Patient Name: Date of Service: JANILA, ARRAZOLA 1/8/2025andnbsp12:45 PM Medical Record Number: [id number] Patient Account Number: [id number] Date of Birth/Gender: Treating RN: 1976/03/11 (48 y.o. JEANELL Drury Nestle Primary Care Physician: Delbert Clam Other  Clinician: Referring Physician: Treating Physician/Extender: Rosan Harlene Delbert Clam Weeks in Treatment: 0 Education Assessment Education Provided To: Patient Education Topics Provided Venous: Handouts: Controlling Swelling with Compression Stockings Methods: Explain/Verbal Responses: Reinforcements needed Electronic Signature(s) Signed: 06/15/2023 5:28:22 PM By: Drury Nestle RN, BSN Entered By: Drury Nestle on 06/15/2023 13:23:59 -------------------------------------------------------------------------------- Vitals Details Patient Name: Date of Service: SAYA, MCCOLL 06/15/2023 12:45 PM Medical Record Number: [id number] Patient Account Number: [id number] Date of Birth/Sex: Treating RN: 01/23/1976 (48 y.o. JEANELL Drury Nestle Primary Care Skyllar Notarianni: Delbert Clam Other Clinician: Referring Haylo Fake: Treating Ambre Kobayashi/Extender: Rosan Harlene Delbert Clam Weeks in Treatment: 0 Gorley, [place]ie ([id number]) [id number] Page 7 of 7 Vital Signs Time Taken: 13:01 Temperature (F): 97.9 Height (in): 64 Pulse (bpm): 50 Weight (lbs): 223 Respiratory Rate (breaths/min): 16 Body Mass Index (BMI): 38.3 Blood Pressure (mmHg): 124/78 Reference Range: 80 - 120 mg / dl Electronic Signature(s) Signed: 06/15/2023 5:28:22 PM By: Drury Nestle RN, BSN Entered By: Drury Nestle on 06/15/2023 13:01:36

## 2023-06-16 NOTE — Progress Notes (Signed)
 Nancy Terry ([id number]) [id number] Page 1 of 4 Visit Report for 06/15/2023 Abuse Risk Screen Details Patient Name: Date of Service: Nancy Terry 06/15/2023 12:45 PM Medical Record Number: [id number] Patient Account Number: [id number] Date of Birth/Sex: Treating RN: 11/26/1975 (48 y.o. Nancy Terry Primary Care Aedon Deason: Delbert Clam Other Clinician: Referring Alletta Mattos: Treating Messiah Ahr/Extender: Rosan Harlene Delbert Clam Weeks in Treatment: 0 Abuse Risk Screen Items Answer ABUSE RISK SCREEN: Has anyone close to you tried to hurt or harm you recentlyo No Do you feel uncomfortable with anyone in your familyo No Has anyone forced you do things that you didnt want to doo No Electronic Signature(s) Signed: 06/15/2023 5:28:22 PM By: Drury Nestle RN, BSN Entered By: Drury Terry on 06/15/2023 12:55:57 -------------------------------------------------------------------------------- Activities of Daily Living Details Patient Name: Date of Service: Nancy Terry 06/15/2023 12:45 PM Medical Record Number: [id number] Patient Account Number: [id number] Date of Birth/Sex: Treating RN: [birth year] (48 y.o. Nancy Terry Primary Care Luane Rochon: Delbert Clam Other Clinician: Referring Lanetra Hartley: Treating Aquan Kope/Extender: Rosan Harlene Delbert Clam Weeks in Treatment: 0 Activities of Daily Living Items Answer Activities of Daily Living (Please select one for each item) Drive Automobile Completely Able T Medications ake Completely Able Use T elephone Completely Able Care for Appearance Completely Able Use T oilet Completely Able Bath / Shower Completely Able Dress Self Completely Able Feed Self Completely Able Walk Completely Able Get In / Out Bed Completely Able Housework Completely Able Prepare Meals Completely Able Handle Money Completely Able Shop for Self Completely Able Electronic Signature(s) Signed: 06/15/2023 5:28:22 PM  By: Drury Nestle RN, BSN Entered By: Drury Terry on 06/15/2023 12:56:10 Nancy Terry ([id number]) [id number] Page 2 of 4 -------------------------------------------------------------------------------- Education Screening Details Patient Name: Date of Service: Nancy Terry 06/15/2023 12:45 PM Medical Record Number: [id number] Patient Account Number: [id number] Date of Birth/Sex: Treating RN: Aug 05, 1975 (48 y.o. Nancy Terry Primary Care Joyous Gleghorn: Delbert Clam Other Clinician: Referring Analea Muller: Treating Jaqulyn Chancellor/Extender: Rosan Harlene Delbert Clam Weeks in Treatment: 0 Primary Learner Assessed: Patient Learning Preferences/Education Level/Primary Language Learning Preference: Explanation, Demonstration, Printed Material Highest Education Level: High School Preferred Language: English Cognitive Barrier Language Barrier: No Translator Needed: No Memory Deficit: No Emotional Barrier: No Cultural/Religious Beliefs Affecting Medical Care: No Physical Barrier Impaired Vision: No Impaired Hearing: No Decreased Hand dexterity: No Knowledge/Comprehension Knowledge Level: High Comprehension Level: High Ability to understand written instructions: High Ability to understand verbal instructions: High Motivation Anxiety Level: Calm Cooperation: Cooperative Education Importance: Acknowledges Need Interest in Health Problems: Asks Questions Perception: Coherent Willingness to Engage in Self-Management High Activities: Readiness to Engage in Self-Management High Activities: Electronic Signature(s) Signed: 06/15/2023 5:28:22 PM By: Drury Nestle RN, BSN Entered By: Drury Terry on 06/15/2023 12:56:31 -------------------------------------------------------------------------------- Fall Risk Assessment Details Patient Name: Date of Service: Nancy Terry 06/15/2023 12:45 PM Medical Record Number: [id number] Patient Account Number:  [id number] Date of Birth/Sex: Treating RN: 07-Oct-1975 (47 y.o. Nancy Terry Primary Care Lus Kriegel: Delbert Clam Other Clinician: Referring Simrin Vegh: Treating Mykaela Arena/Extender: Rosan Harlene Delbert Clam Weeks in Treatment: 0 Fall Risk Assessment Items Have you had 2 or more falls in the last 12 monthso 0 No Nancy Terry ([id number]) [id number] Page 3 of 4 Have you had any fall that resulted in injury in the last 12 monthso 0 No FALLS RISK SCREEN History of falling - immediate or within 3 months 0 No Secondary diagnosis (Do you have 2 or more medical diagnoseso) 0 No Ambulatory aid None/bed rest/wheelchair/nurse 0 Yes Crutches/cane/walker 0 No Furniture  0 No Intravenous therapy Access/Saline/Heparin Lock 0 No Gait/Transferring Normal/ bed rest/ wheelchair 0 Yes Weak (short steps with or without shuffle, stooped but able to lift head while walking, may seek 0 No support from furniture) Impaired (short steps with shuffle, may have difficulty arising from chair, head down, impaired 0 No balance) Mental Status Oriented to own ability 0 Yes Electronic Signature(s) Signed: 06/15/2023 5:28:22 PM By: Drury Nestle RN, BSN Entered By: Drury Terry on 06/15/2023 12:57:35 -------------------------------------------------------------------------------- Foot Assessment Details Patient Name: Date of Service: Nancy Terry 06/15/2023 12:45 PM Medical Record Number: [id number] Patient Account Number: [id number] Date of Birth/Sex: Treating RN: Dec 10, 1975 (48 y.o. Nancy Terry Primary Care Libbi Towner: Delbert Clam Other Clinician: Referring Chantalle Defilippo: Treating Desean Heemstra/Extender: Rosan Harlene Delbert Clam Weeks in Treatment: 0 Foot Assessment Items Site Locations + = Sensation present, - = Sensation absent, C = Callus, U = Ulcer R = Redness, W = Warmth, M = Maceration, PU = Pre-ulcerative lesion F = Fissure, S = Swelling, D =  Dryness Assessment Right: Left: Other Deformity: No No Prior Foot Ulcer: No No Prior Amputation: No No Charcot Joint: No No Ambulatory Status: Ambulatory Without Help Gait: Steady Nancy Terry ([id number]) [id number] Page 4 of 4 Notes NO BLE wounds. Electronic Signature(s) Signed: 06/15/2023 5:28:22 PM By: Drury Nestle RN, BSN Entered By: Drury Terry on 06/15/2023 12:58:05 -------------------------------------------------------------------------------- Nutrition Risk Screening Details Patient Name: Date of Service: VERNIDA, MCNICHOLAS 06/15/2023 12:45 PM Medical Record Number: [id number] Patient Account Number: [id number] Date of Birth/Sex: Treating RN: 1976-05-22 (48 y.o. Nancy Terry Primary Care Yannet Rincon: Delbert Clam Other Clinician: Referring Maveryck Bahri: Treating Eleora Sutherland/Extender: Rosan Harlene Delbert Clam Weeks in Treatment: 0 Height (in): Weight (lbs): Body Mass Index (BMI): Nutrition Risk Screening Items Score Screening NUTRITION RISK SCREEN: I have an illness or condition that made me change the kind and/or amount of food I eat 2 Yes I eat fewer than two meals per day 0 No I eat few fruits and vegetables, or milk products 0 No I have three or more drinks of beer, liquor or wine almost every day 0 No I have tooth or mouth problems that make it hard for me to eat 0 No I don't always have enough money to buy the food I need 0 No I eat alone most of the time 0 No I take three or more different prescribed or over-the-counter drugs a day 1 Yes Without wanting to, I have lost or gained 10 pounds in the last six months 0 No I am not always physically able to shop, cook and/or feed myself 0 No Nutrition Protocols Good Risk Protocol Moderate Risk Protocol 0 Provide education on nutrition High Risk Proctocol Risk Level: Moderate Risk Score: 3 Electronic Signature(s) Signed: 06/15/2023 5:28:22 PM By: Drury Nestle RN, BSN Entered  By: Drury Terry on 06/15/2023 12:57:15

## 2023-06-16 NOTE — Progress Notes (Signed)
 Terry, Nancy ([id number]) [id number] Page 1 of 7 Visit Report for 06/15/2023 Chief Complaint Document Details Patient Name: Date of Service: Nancy Terry, Nancy Terry 06/15/2023 12:45 PM Medical Record Number: [id number] Patient Account Number: [id number] Date of Birth/Sex: Treating RN: February 16, 1976 (48 y.o. F) Primary Care Provider: Delbert Clam Other Clinician: Referring Provider: Treating Provider/Extender: Rosan Harlene Delbert Clam Weeks in Treatment: 0 Information Obtained from: Patient Chief Complaint 08/21/2020; patient is here with a wound on the left anterior mid tibia 06/15/2023; history of wounds to her lower extremities bilaterally most recently on the right leg however this is closed today Electronic Signature(s) Signed: 06/15/2023 5:26:35 PM By: Rosan Harlene DO Entered By: Rosan Harlene on 06/15/2023 14:44:41 -------------------------------------------------------------------------------- HPI Details Patient Name: Date of Service: Nancy, Terry 06/15/2023 12:45 PM Medical Record Number: [id number] Patient Account Number: [id number] Date of Birth/Sex: Treating RN: 09/01/1975 (48 y.o. 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 She was originally referred here apparently for an area on the right lateral and posterior lower calf which was prompted by a fall while walking dogs. She was seen in the ER on 05/02/2020 for this she was given Silvadene  and Septra  and I gather these areas closed over. Right leg x-ray was negative. HOWEVER she had another fall a week ago again while walking her dog she has an area on the left anterior mid tibia. She tells me she has had a long standing history of edema in her legs  that is but not easy to control. She has wound history. She does not wear compression of any type. No history of a DVT reviewing [place] link. Past medical history includes hypertension, migraines, chronic venous insufficiency, hidradenitis, chronic pain on methadone, chronic hep C ABIs in our clinic were 1.03 on the right and 1.1 on the left 4/28; patient was last seen on 3/17 for a left lower extremity wound. She has since healed. She presents today for further advice on compression stockings. She had venous reflux studies done since her last clinic visit. 06/15/2023 Ms. Beaulah is a 48 year old female with a past medical history of venous insufficiency that presents to clinic for history of wounds to her lower extremities bilaterally. She states she recently had a wound to the right leg however this has closed and healed in the past 1-2 weeks. She has been seen in our clinic before for this issue. She does not wear compression stockings. Electronic Signature(s) Signed: 06/15/2023 5:26:35 PM By: Rosan Harlene DO Entered By: Rosan Harlene on 06/15/2023 14:47:47 Russey, Taheera ([id number]) [id number] Page 2 of 7 -------------------------------------------------------------------------------- Physical Exam Details Patient Name: Date of Service: Nancy, Terry 06/15/2023 12:45 PM Medical Record Number: [id number] Patient Account Number: [id number] Date of Birth/Sex: Treating RN: [birth year] (48 y.o. F) Primary Care Provider: Delbert Clam Other Clinician: Referring Provider: Treating Provider/Extender: Rosan Harlene Delbert Clam Weeks in Treatment: 0 Constitutional respirations regular, non-labored and within target range for patient.. Cardiovascular 2+ dorsalis pedis/posterior tibialis pulses. Psychiatric pleasant and cooperative. Notes 2+ pitting edema to the knees bilaterally. No open wounds. Electronic Signature(s) Signed: 06/15/2023 5:26:35 PM By:  Rosan Harlene DO Entered By: Rosan Harlene on 06/15/2023 14:48:06 -------------------------------------------------------------------------------- Physician Orders Details Patient Name: Date of Service: NELTA, CAUDILL 06/15/2023 12:45 PM Medical Record Number: [id number] Patient Account Number: [id number] Date of Birth/Sex: Treating RN:  24-Nov-1975 (47 y.o. JEANELL Drury Nestle Primary Care Provider: Delbert Clam Other Clinician: Referring Provider: Treating Provider/Extender: Rosan Harlene Delbert Clam Weeks in Treatment: 0 The following information was scribed by: Drury Nestle The information was scribed for: Rosan Harlene Verbal / Phone Orders: No Diagnosis Coding Discharge From [facility] Services Discharge from Wound Care Center - Call if any future wound care needs. ***purchase over the counter compression stockings- wear daily.*** May need to speak with PCP about a fluid pill due to swelling and aid in removal of fluid from legs. Edema Control - Orders / Instructions Patient to wear own compression stockings every day. - FOR LIFE replace 6-57months Exercise regularly Moisturize legs daily. Compression stocking or Garment 10-20 mm/Hg pressure to: Electronic Signature(s) Signed: 06/15/2023 5:26:35 PM By: Rosan Harlene DO Entered By: Rosan Harlene on 06/15/2023 14:50:03 Vanderzee, Darsha ([id number]) [id number] Page 3 of 7 -------------------------------------------------------------------------------- Problem List Details Patient Name: Date of Service: Nancy, Terry 06/15/2023 12:45 PM Medical Record Number: [id number] Patient Account Number: [id number] Date of Birth/Sex: Treating RN: [birth year] (48 y.o. F) Primary Care Provider: Delbert Clam Other Clinician: Referring Provider: Treating Provider/Extender: Rosan Harlene Delbert Clam Weeks in Treatment: 0 Active Problems ICD-10 Encounter Code Description Active Date  MDM Diagnosis I87.2 Venous insufficiency (chronic) (peripheral) 06/15/2023 No Yes Inactive Problems Resolved Problems Electronic Signature(s) Signed: 06/15/2023 5:26:35 PM By: Rosan Harlene DO Entered By: Rosan Harlene on 06/15/2023 14:43:38 -------------------------------------------------------------------------------- Progress Note Details Patient Name: Date of Service: TYLAN, BRIGUGLIO 06/15/2023 12:45 PM Medical Record Number: [id number] Patient Account Number: [id number] Date of Birth/Sex: Treating RN: 1975-11-05 (48 y.o. 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 She was originally referred here apparently for an area on the right lateral and posterior lower calf which was prompted by a fall while walking dogs. She was seen in the ER on 05/02/2020 for this she was given Silvadene  and Septra  and I gather these areas closed over. Right leg x-ray was negative. HOWEVER she had another fall a week ago again while walking her dog she has an area on the left anterior mid tibia. She tells me she has had a long standing history of edema in her legs that is but not easy to control. She has wound history. She does not wear compression of any type. No history of a DVT reviewing [place] link. Past medical history includes hypertension, migraines, chronic venous insufficiency, hidradenitis, chronic pain on methadone, chronic hep C ABIs in our clinic were 1.03 on the right and 1.1 on the left 4/28;  patient was last seen on 3/17 for a left lower extremity wound. She has since healed. She presents today for further advice on compression stockings. She had venous reflux studies done since her last clinic visit. 06/15/2023 Faidley, DEVERE ([id number]) [id number] Page 4 of 7 Ms. Beaulah is a 48 year old female with a past medical history of venous insufficiency that presents to clinic for history of wounds to her lower extremities bilaterally. She states she recently had a wound to the right leg however this has closed and healed in the past 1-2 weeks.  She has been seen in our clinic before for this issue. She does not wear compression stockings. Patient History Information obtained from Patient. Allergies Haldol (Reaction: throat swelling, dizzy) Family History Cancer - Maternal Grandparents, Heart Disease - Mother, Hypertension - Maternal Grandparents, Lung Disease - Maternal Grandparents, No family history of Diabetes, Hereditary Spherocytosis, Kidney Disease, Seizures, Stroke, Thyroid  Problems, Tuberculosis. Social History Current every day smoker, Marital Status - Divorced, Alcohol Use - Never, Drug Use - Prior History, Caffeine Use - Daily. Medical History Cardiovascular Patient has history of Hypertension Gastrointestinal Patient has history of Hepatitis C - 5 years clear Integumentary (Skin) Denies history of History of Burn Musculoskeletal Patient has history of Rheumatoid Arthritis Medical A Surgical History Notes nd Hematologic/Lymphatic vitamin D  deficiency Genitourinary Complex Ovarian Cyst Neurologic Sciatica, Migraines Psychiatric anxiety/depression- bipolar Objective Constitutional respirations regular, non-labored and within target range for patient.. Vitals Time Taken: 1:01 PM, Height: 64 in, Weight: 223 lbs, BMI: 38.3, Temperature: 97.9 F, Pulse: 50 bpm, Respiratory Rate: 16 breaths/min, Blood Pressure: 124/78 mmHg. Cardiovascular 2+  dorsalis pedis/posterior tibialis pulses. Psychiatric pleasant and cooperative. General Notes: 2+ pitting edema to the knees bilaterally. No open wounds. Assessment Active Problems ICD-10 Venous insufficiency (chronic) (peripheral) Patient has a history of venous insufficiency. Fortunately she does not have wounds today. I recommended she wear compression stockings daily. We gave her measurements today to order new compression stockings. Plan Discharge From [facility] Services: [place], [place] ([id number]) [id number] Page 5 of 7 Discharge from Wound Care Center - Call if any future wound care needs. ***purchase over the counter compression stockings- wear daily.*** May need to speak with PCP about a fluid pill due to swelling and aid in removal of fluid from legs. Edema Control - Orders / Instructions: Patient to wear own compression stockings every day. - FOR LIFE replace 6-51months Exercise regularly Moisturize legs daily. Compression stocking or Garment 10-20 mm/Hg pressure to: 1. Compression stockings daily 2. Follow up as needed Electronic Signature(s) Signed: 06/15/2023 5:26:35 PM By: Rosan Raisin DO Entered By: Rosan Raisin on 06/15/2023 14:53:27 -------------------------------------------------------------------------------- HxROS Details Patient Name: Date of Service: EMMALOU, HUNGER 06/15/2023 12:45 PM Medical Record Number: [id number] Patient Account Number: [id number] Date of Birth/Sex: Treating RN: 08-07-1975 (48 y.o. JEANELL Drury Nestle Primary Care Provider: Delbert Clam Other Clinician: Referring Provider: Treating Provider/Extender: Rosan Raisin Delbert Clam Weeks in Treatment: 0 Information Obtained From Patient Hematologic/Lymphatic Medical History: Past Medical History Notes: vitamin D  deficiency Cardiovascular Medical History: Positive for: Hypertension Gastrointestinal Medical History: Positive for: Hepatitis C - 5 years  clear Genitourinary Medical History: Past Medical History Notes: Complex Ovarian Cyst Integumentary (Skin) Medical History: Negative for: History of Burn Musculoskeletal Medical History: Positive for: Rheumatoid Arthritis Neurologic Medical History: Past Medical History Notes: Sciatica, Migraines Psychiatric Fabrizio, Haylee ([id number]) [id number] Page 6 of 7 Medical History: Past Medical History Notes: anxiety/depression- bipolar Immunizations Pneumococcal Vaccine: Received Pneumococcal Vaccination: No Implantable Devices None Family and Social History Cancer: Yes - Maternal Grandparents; Diabetes: No; Heart Disease: Yes - Mother; Hereditary Spherocytosis: No; Hypertension: Yes - Maternal Grandparents; Kidney Disease: No; Lung Disease: Yes - Maternal Grandparents; Seizures: No; Stroke: No; Thyroid  Problems: No; Tuberculosis: No; Current every day smoker; Marital Status - Divorced; Alcohol Use: Never; Drug Use: Prior History; Caffeine Use: Daily Social Determinants of Health (SDOH) 1. In the past 2 months, did you or others you live with eat smaller meals or skip meals because you didn't have money for foodo : No 2. Are you homeless or worried that you might  be in the futureo : No 3. Do you have trouble paying for your utilities (gas, electricity, phone)o : No 4. Do you have trouble finding or paying for a rideo : No 5. Do you need daycare, or better daycare, for your kidso : No 6. Are you unemployed or without regular incomeo : No 7. Do you need help finding a better jobo : No 8. Do you need help getting more educationo : No 9. Are you concerned about someone in your home using drugs or alcoholo : No 10. Do you feel unsafe in your daily lifeo : No 11. Is anyone in your home threatening or abusing youo : No 12. Do you lack quality relationships that make you feel valued and supportedo : No 13. Do you need help getting cultural information in a  language you understando : No 14. Do you need help getting internet accesso : No Advanced Directives and Instructions Spiritual or Cultural beliefs preclude asking about Advance Care Planning: No Advanced Directives: No Patient wants information on Advanced Directives: No Do not resuscitate: No Living Will: No Medical Power of Attorney: No Surrogate Decision Maker: No Electronic Signature(s) Signed: 06/15/2023 5:26:35 PM By: Rosan Raisin DO Signed: 06/15/2023 5:28:22 PM By: Drury Nestle RN, BSN Entered By: Drury Nestle on 06/15/2023 12:55:52 -------------------------------------------------------------------------------- SuperBill Details Patient Name: Date of Service: SIDDALEE, VANDERHEIDEN 06/15/2023 Medical Record Number: [id number] Patient Account Number: [id number] Date of Birth/Sex: Treating RN: 02/16/1976 (48 y.o. JEANELL Drury Nestle Primary Care Provider: Delbert Clam Other Clinician: Referring Provider: Treating Provider/Extender: Rosan Raisin Delbert Clam Weeks in Treatment: 0 Diagnosis Coding ICD-10 Codes Code Description I87.2 Venous insufficiency (chronic) (peripheral) Facility Procedures Physician Procedures : CPT4 Code Description Modifier [id number] - WC PHYS LEVEL 3 - EST PT ICD-10 Diagnosis Description I87.2 Venous insufficiency (chronic) (peripheral) Quantity: 1 Electronic Signature(s) Signed: 06/15/2023 5:26:35 PM By: Rosan Raisin DO Entered By: Rosan Raisin on 06/15/2023 14:53:52

## 2023-06-29 NOTE — Progress Notes (Unsigned)
GYNECOLOGY VISIT  Patient name: Nancy Terry MRN [id number]  Date of birth: 1975-06-13 Chief Complaint:   surgery consult  History:  Samyuktha Sokolski is a 48 y.o. G3P3000 being seen today for preop discussion. Husband recently out of the hospital and she has concerns about careing for him and being postoperative as well.    Scheduled for HSC, D&C, and novasure ablation 05/2023: AUB worsened with megace; EMB w/ weakly proliferative endometrium  Didn't like gabpentin Clonidine prn just in case afterwards for pain control given her methadone    Past Medical History:  Diagnosis Date   Anxiety    Depression    Hepatitis C    Herpes infection    Hypertension    Kidney stones    Migraines    Narcotic abuse (HCC)     Past Surgical History:  Procedure Laterality Date   BREAST BIOPSY     ELBOW SURGERY Left    KIDNEY STONE SURGERY     with stent placement   LITHOTRIPSY     TUBAL LIGATION      The following portions of the patient's history were reviewed and updated as appropriate: allergies, current medications, past family history, past medical history, past social history, past surgical history and problem list.   Health Maintenance:   Last pap     Component Value Date/Time   DIAGPAP  03/30/2021 1108    - Negative for intraepithelial lesion or malignancy (NILM)   DIAGPAP  08/14/2019 1356    - Negative for intraepithelial lesion or malignancy (NILM)   HPVHIGH Negative 03/30/2021 1108   HPVHIGH Positive (A) 08/14/2019 1356   ADEQPAP  03/30/2021 1108    Satisfactory for evaluation; transformation zone component PRESENT.   ADEQPAP  08/14/2019 1356    Satisfactory for evaluation; transformation zone component PRESENT.    High Risk HPV: Positive  Adequacy:  Satisfactory for evaluation, transformation zone component PRESENT  Diagnosis:  Atypical squamous cells of undetermined significance (ASC-US)  Last mammogram: 04/2023 BIRADS 1   Review of Systems:  Pertinent items  are noted in HPI. Comprehensive review of systems was otherwise negative.   Objective:  Physical Exam BP (!) 152/70   Pulse (!) 51   Wt 204 lb (92.5 kg)   LMP 06/30/2023 (Exact Date)   BMI 36.14 kg/m    Physical Exam Vitals and nursing note reviewed.  Constitutional:      Appearance: Normal appearance.  HENT:     Head: Normocephalic and atraumatic.  Pulmonary:     Effort: Pulmonary effort is normal.  Skin:    General: Skin is warm and dry.  Neurological:     General: No focal deficit present.     Mental Status: She is alert.  Psychiatric:        Mood and Affect: Mood normal.        Behavior: Behavior normal.        Thought Content: Thought content normal.        Judgment: Judgment normal.       Assessment & Plan:   1. Abnormal uterine bleeding (AUB) (Primary) Reviewed plan for hysteroscopy with endometrial ablation. All questions. Will have husband, possibly daughter and/or mother for transportation. Reviewed postop expectations and that planned procedure is transvaginal and restrictions are for post-anesthetic care and pelvic rest x4 weeks.   2. Abnormal urinalysis Repeat urine culture today  - Urine Culture  Routine preventative health maintenance measures emphasized.  Lorriane Shire, MD Minimally Invasive [facility]ologic [facility], [facility]

## 2023-06-30 ENCOUNTER — Ambulatory Visit: Payer: MEDICAID | Admitting: Obstetrics and Gynecology

## 2023-06-30 ENCOUNTER — Encounter: Payer: Self-pay | Admitting: Obstetrics and Gynecology

## 2023-06-30 ENCOUNTER — Other Ambulatory Visit: Payer: Self-pay

## 2023-06-30 VITALS — BP 152/70 | HR 51 | Wt 204.0 lb

## 2023-06-30 DIAGNOSIS — R8290 Unspecified abnormal findings in urine: Secondary | ICD-10-CM | POA: Diagnosis not present

## 2023-06-30 DIAGNOSIS — N939 Abnormal uterine and vaginal bleeding, unspecified: Secondary | ICD-10-CM

## 2023-06-30 LAB — POCT URINALYSIS DIP (DEVICE)
Bilirubin Urine: NEGATIVE
Glucose, UA: NEGATIVE mg/dL
Ketones, ur: NEGATIVE mg/dL
Nitrite: NEGATIVE
Protein, ur: NEGATIVE mg/dL
Specific Gravity, Urine: 1.02 (ref 1.005–1.030)
Urobilinogen, UA: 0.2 mg/dL (ref 0.0–1.0)
pH: 7 (ref 5.0–8.0)

## 2023-07-02 LAB — URINE CULTURE

## 2023-07-04 ENCOUNTER — Other Ambulatory Visit: Payer: Self-pay | Admitting: Obstetrics and Gynecology

## 2023-07-04 ENCOUNTER — Telehealth: Payer: Self-pay

## 2023-07-04 DIAGNOSIS — R300 Dysuria: Secondary | ICD-10-CM

## 2023-07-04 DIAGNOSIS — N898 Other specified noninflammatory disorders of vagina: Secondary | ICD-10-CM

## 2023-07-04 DIAGNOSIS — N3001 Acute cystitis with hematuria: Secondary | ICD-10-CM

## 2023-07-04 MED ORDER — SULFAMETHOXAZOLE-TRIMETHOPRIM 800-160 MG PO TABS: 1.0000 | ORAL_TABLET | Freq: Two times a day (BID) | ORAL | 0 refills | Status: AC

## 2023-07-04 MED ORDER — FLUCONAZOLE 150 MG PO TABS: 150.0000 mg | ORAL_TABLET | Freq: Once | ORAL | 0 refills | Status: AC

## 2023-07-04 NOTE — BH Specialist Note (Deleted)
 Integrated Behavioral Health via Telemedicine Visit  07/04/2023 Messiah Rovira [id number]  Number of Integrated Behavioral Health Clinician visits: 2- Second Visit  Session Start time: 1550   Session End time: 1630  Total time in minutes: 40   Referring Provider: Lorriane Shire, MD Patient/Family location: Home*** [facility] Provider location: [facility]  All persons participating in visit: Patient Kinzie Wickes and [facility] Sarp Vernier ***  Types of Service: {CHL AMB TYPE OF SERVICE:[phone]}  I connected with [name] and/or Darl Pikes Val's {family members:20773} via  Telephone or Video Enabled Telemedicine Application  (Video is Caregility application) and verified that I am speaking with the correct person using two identifiers. Discussed confidentiality: Yes   I discussed the limitations of telemedicine and the availability of in person appointments.  Discussed there is a possibility of technology failure and discussed alternative modes of communication if that failure occurs.  I discussed that engaging in this telemedicine visit, they consent to the provision of behavioral healthcare and the services will be billed under their insurance.  Patient and/or legal guardian expressed understanding and consented to Telemedicine visit: Yes   Presenting Concerns: Patient and/or family reports the following symptoms/concerns: *** Duration of problem: ***; Severity of problem: {Mild/Moderate/Severe:20260}  Patient and/or Family's Strengths/Protective Factors: {CHL AMB BH PROTECTIVE [phone]}  Goals Addressed: Patient will:  Reduce symptoms of: {IBH Symptoms:21014056}   Increase knowledge and/or ability of: {IBH Patient Tools:21014057}   Demonstrate ability to: {IBH Goals:21014053}  Progress towards Goals: {CHL AMB BH PROGRESS TOWARDS GOALS:[phone]}  Interventions: Interventions utilized:  {IBH  Interventions:21014054} Standardized Assessments completed: {IBH Screening Tools:21014051}  Patient and/or Family Response: Patient agrees with treatment plan. ***  Assessment: Patient currently experiencing ***.   Patient may benefit from psychoeducation and brief therapeutic interventions regarding coping with symptoms of *** .  Plan: Follow up with behavioral health clinician on : *** Behavioral recommendations:  -*** -*** Referral(s): {IBH Referrals:21014055}  I discussed the assessment and treatment plan with the patient and/or parent/guardian. They were provided an opportunity to ask questions and all were answered. They agreed with the plan and demonstrated an understanding of the instructions.   They were advised to call back or seek an in-person evaluation if the symptoms worsen or if the condition fails to improve as anticipated.  Rae Lips, LCSW     06/30/2023    3:58 PM 05/12/2023    4:11 PM 04/01/2023    3:13 PM 03/09/2023    5:36 PM 09/20/2022    1:42 PM  Depression screen PHQ 2/9  Decreased Interest 2 0 0 1 1  Down, Depressed, Hopeless 2 0 0 1 1  PHQ - 2 Score 4 0 0 2 2  Altered sleeping 2 0  1 1  Tired, decreased energy 3 0  1 1  Change in appetite 3 0  1 1  Feeling bad or failure about yourself  2 0  1 1  Trouble concentrating 2 2  1 1   Moving slowly or fidgety/restless 0 1  1 1   Suicidal thoughts 0 0  0 0  PHQ-9 Score 16 3  8 8       06/30/2023    3:59 PM 05/12/2023    4:11 PM 03/09/2023    5:37 PM 09/20/2022    1:42 PM  GAD 7 : Generalized Anxiety Score  Nervous, Anxious, on Edge 1 2 3 2   Control/stop worrying 1 3 3 2   Worry too much - different  things 2 3 3 2   Trouble relaxing 2 3 3 2   Restless 1 1 1 2   Easily annoyed or irritable 1 3 2 2   Afraid - awful might happen 1 0 1 2  Total GAD 7 Score 9 15 16  14

## 2023-07-04 NOTE — Telephone Encounter (Addendum)
Called patient to informed of urine results. Antibiotic sent to patient's pharmacy. Informed patient to finished full 5 day course. Diflucan also ordered if any yeast infection systems are present.  Patient voiced that she will pick up medication from pharmacy.    ----- Message from Lorriane Shire sent at 07/04/2023  8:36 AM EST ----- Notify that urine culture shows persistent urinary infection  - new prescription sent into pharmacy to treat.  I've also send diflucan in case of yeast infection following antibiotic

## 2023-07-12 ENCOUNTER — Encounter (HOSPITAL_COMMUNITY): Payer: Self-pay

## 2023-07-12 ENCOUNTER — Encounter (HOSPITAL_BASED_OUTPATIENT_CLINIC_OR_DEPARTMENT_OTHER): Payer: Self-pay | Admitting: Obstetrics and Gynecology

## 2023-07-12 NOTE — Progress Notes (Signed)
 Spoke w/ via phone for pre-op interview--- Devere Malm needs dos----  UPT per anesthesia. Surgeon orders requested 07/12/23.       Lab results------ COVID test -----patient states asymptomatic no test needed Arrive at -------0930 NPO after MN NO Solid Food.  Clear liquids from MN until---0830 Med rec completed Medications to take morning of surgery -----Methadone, Bactrum and Xanax PRN Diabetic medication ----- Patient instructed no nail polish to be worn day of surgery Patient instructed to bring photo id and insurance card day of surgery Patient aware to have Driver (ride ) / caregiver    for 24 hours after surgery - Husband Odis More Patient Special Instructions ----- Pre-Op special Instructions ----- Patient verbalized understanding of instructions that were given at this phone interview. Patient denies chest pain, sob, fever, cough at the interview.

## 2023-07-14 ENCOUNTER — Ambulatory Visit: Payer: MEDICAID

## 2023-07-14 ENCOUNTER — Other Ambulatory Visit: Payer: Self-pay

## 2023-07-14 VITALS — BP 125/55 | HR 59 | Ht 63.0 in | Wt 203.0 lb

## 2023-07-14 DIAGNOSIS — R3989 Other symptoms and signs involving the genitourinary system: Secondary | ICD-10-CM | POA: Diagnosis not present

## 2023-07-14 LAB — POCT URINALYSIS DIP (DEVICE)
Glucose, UA: NEGATIVE mg/dL
Ketones, ur: NEGATIVE mg/dL
Leukocytes,Ua: NEGATIVE
Nitrite: NEGATIVE
Protein, ur: 100 mg/dL — AB
Specific Gravity, Urine: >=1.03 (ref 1.005–1.030)
Urobilinogen, UA: 0.2 mg/dL (ref 0.0–1.0)
pH: 5.5 (ref 5.0–8.0)

## 2023-07-14 NOTE — Progress Notes (Signed)
 Pt presents with report that she is having surgery on Monday and needs to know if her UTI is gone.  She was recently treated with Bactrim  for UTI but was not able to complete the Rx due to multiple issues/reactions. She reports taking the medication for a total of 5 days. Pt denies urinary frequency or painful urination @ present. She is still having heavy vaginal bleeding and clots.  CCUA was not indicative of UTI but will send culture to confirm. Pt states that Dr. Jeralyn had previously offered to refill certain medications which she declined @ the time of visit. Pt would now like to have the following medications prescribed as she feels she will need them after her surgery: Clonidine , Gabapentin  and Hydroxyzine .

## 2023-07-16 LAB — URINE CULTURE

## 2023-07-17 NOTE — Anesthesia Preprocedure Evaluation (Addendum)
 Anesthesia Evaluation  Patient identified by MRN, date of birth, ID band Patient awake    Reviewed: Allergy & Precautions, NPO status , Patient's Chart, lab work & pertinent test results  Airway Mallampati: I  TM Distance: >3 FB Neck ROM: Full    Dental no notable dental hx. 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)  , Hepatitis -, C  Endo/Other    Renal/GU      Musculoskeletal   Abdominal   Peds  Hematology   Anesthesia Other Findings All: haldol  Reproductive/Obstetrics                             Anesthesia Physical Anesthesia Plan  ASA: 3  Anesthesia Plan: General   Post-op Pain Management: Toradol  IV (intra-op)*, Tylenol  PO (pre-op)* and Precedex    Induction: Intravenous  PONV Risk Score and Plan: Midazolam, Ondansetron  and Treatment may vary due to age or medical condition  Airway Management Planned: LMA  Additional Equipment:   Intra-op Plan:   Post-operative Plan: Extubation in OR  Informed Consent: I have reviewed the patients History and Physical, chart, labs and discussed the procedure including the risks, benefits and alternatives for the proposed anesthesia with the patient or authorized representative who has indicated his/her understanding and acceptance.     Dental advisory given  Plan Discussed with: CRNA and Anesthesiologist  Anesthesia Plan Comments:         Anesthesia Quick Evaluation

## 2023-07-18 ENCOUNTER — Ambulatory Visit (HOSPITAL_BASED_OUTPATIENT_CLINIC_OR_DEPARTMENT_OTHER): Payer: MEDICAID | Admitting: Anesthesiology

## 2023-07-18 ENCOUNTER — Encounter: Payer: Self-pay | Admitting: Obstetrics and Gynecology

## 2023-07-18 ENCOUNTER — Other Ambulatory Visit: Payer: Self-pay

## 2023-07-18 ENCOUNTER — Encounter (HOSPITAL_BASED_OUTPATIENT_CLINIC_OR_DEPARTMENT_OTHER): Payer: Self-pay | Admitting: Obstetrics and Gynecology

## 2023-07-18 ENCOUNTER — Ambulatory Visit (HOSPITAL_BASED_OUTPATIENT_CLINIC_OR_DEPARTMENT_OTHER)
Admission: RE | Admit: 2023-07-18 | Discharge: 2023-07-18 | Disposition: A | Discharge status: Home / Self Care | Payer: MEDICAID | Attending: Obstetrics and Gynecology | Admitting: Obstetrics and Gynecology

## 2023-07-18 ENCOUNTER — Encounter (HOSPITAL_BASED_OUTPATIENT_CLINIC_OR_DEPARTMENT_OTHER)
Admission: RE | Disposition: A | Discharge status: Home / Self Care | Payer: Self-pay | Source: Home / Self Care | Attending: Obstetrics and Gynecology

## 2023-07-18 DIAGNOSIS — N858 Other specified noninflammatory disorders of uterus: Secondary | ICD-10-CM | POA: Diagnosis not present

## 2023-07-18 DIAGNOSIS — F419 Anxiety disorder, unspecified: Secondary | ICD-10-CM | POA: Diagnosis not present

## 2023-07-18 DIAGNOSIS — F17210 Nicotine dependence, cigarettes, uncomplicated: Secondary | ICD-10-CM

## 2023-07-18 DIAGNOSIS — N920 Excessive and frequent menstruation with regular cycle: Secondary | ICD-10-CM

## 2023-07-18 DIAGNOSIS — F32A Depression, unspecified: Secondary | ICD-10-CM | POA: Diagnosis not present

## 2023-07-18 DIAGNOSIS — F418 Other specified anxiety disorders: Secondary | ICD-10-CM

## 2023-07-18 DIAGNOSIS — N938 Other specified abnormal uterine and vaginal bleeding: Secondary | ICD-10-CM

## 2023-07-18 DIAGNOSIS — N939 Abnormal uterine and vaginal bleeding, unspecified: Secondary | ICD-10-CM | POA: Insufficient documentation

## 2023-07-18 DIAGNOSIS — I10 Essential (primary) hypertension: Secondary | ICD-10-CM | POA: Diagnosis not present

## 2023-07-18 HISTORY — PX: DILITATION & CURRETTAGE/HYSTROSCOPY WITH NOVASURE ABLATION: SHX5568

## 2023-07-18 LAB — POCT PREGNANCY, URINE: Preg Test, Ur: NEGATIVE

## 2023-07-18 SURGERY — DILATATION & CURETTAGE/HYSTEROSCOPY WITH NOVASURE ABLATION
Anesthesia: General | Site: Vagina

## 2023-07-18 MED ORDER — FENTANYL CITRATE (PF) 250 MCG/5ML IJ SOLN
INTRAMUSCULAR | Status: DC | PRN
  Administered 2023-07-18: 50 ug via INTRAVENOUS

## 2023-07-18 MED ORDER — EPHEDRINE SULFATE-NACL 50-0.9 MG/10ML-% IV SOSY
PREFILLED_SYRINGE | INTRAVENOUS | Status: DC | PRN
  Administered 2023-07-18: 5 mg via INTRAVENOUS

## 2023-07-18 MED ORDER — IBUPROFEN 600 MG PO TABS: 600.0000 mg | ORAL_TABLET | Freq: Four times a day (QID) | ORAL | 0 refills | Status: DC | PRN

## 2023-07-18 MED ORDER — ONDANSETRON HCL 4 MG/2ML IJ SOLN
INTRAMUSCULAR | Status: AC
Start: 2023-07-18 — End: ?
  Filled 2023-07-18: qty 2

## 2023-07-18 MED ORDER — GABAPENTIN 300 MG PO CAPS
300.0000 mg | ORAL_CAPSULE | Freq: Two times a day (BID) | ORAL | 0 refills | Status: AC | PRN
Start: 2023-07-18 — End: ?

## 2023-07-18 MED ORDER — SODIUM CHLORIDE 0.9 % IV SOLN: INTRAVENOUS | Status: DC

## 2023-07-18 MED ORDER — STERILE WATER FOR IRRIGATION IR SOLN
Status: DC | PRN
  Administered 2023-07-18: 500 mL

## 2023-07-18 MED ORDER — SODIUM CHLORIDE 0.9 % IR SOLN
Status: DC | PRN
  Administered 2023-07-18: 3000 mL

## 2023-07-18 MED ORDER — KETOROLAC TROMETHAMINE 30 MG/ML IJ SOLN
INTRAMUSCULAR | Status: AC
  Filled 2023-07-18: qty 1

## 2023-07-18 MED ORDER — KETOROLAC TROMETHAMINE 30 MG/ML IJ SOLN
INTRAMUSCULAR | Status: DC | PRN
Start: 2023-07-18 — End: 2023-07-18
  Administered 2023-07-18: 30 mg via INTRAVENOUS

## 2023-07-18 MED ORDER — FENTANYL CITRATE (PF) 100 MCG/2ML IJ SOLN
INTRAMUSCULAR | Status: AC
  Filled 2023-07-18: qty 2

## 2023-07-18 MED ORDER — DEXAMETHASONE SODIUM PHOSPHATE 10 MG/ML IJ SOLN
INTRAMUSCULAR | Status: DC | PRN
  Administered 2023-07-18: 10 mg via INTRAVENOUS

## 2023-07-18 MED ORDER — LIDOCAINE 2% (20 MG/ML) 5 ML SYRINGE
INTRAMUSCULAR | Status: DC | PRN
  Administered 2023-07-18: 100 mg via INTRAVENOUS

## 2023-07-18 MED ORDER — ACETAMINOPHEN 500 MG PO TABS
ORAL_TABLET | ORAL | Status: AC
Start: 2023-07-18 — End: ?
  Filled 2023-07-18: qty 2

## 2023-07-18 MED ORDER — ONDANSETRON HCL 4 MG/2ML IJ SOLN
INTRAMUSCULAR | Status: DC | PRN
  Administered 2023-07-18: 4 mg via INTRAVENOUS

## 2023-07-18 MED ORDER — BUPIVACAINE HCL (PF) 0.5 % IJ SOLN
INTRAMUSCULAR | Status: DC | PRN
  Administered 2023-07-18: 20 mL

## 2023-07-18 MED ORDER — OXYCODONE HCL 5 MG PO TABS
5.0000 mg | ORAL_TABLET | Freq: Once | ORAL | Status: AC
  Administered 2023-07-18: 5 mg via ORAL

## 2023-07-18 MED ORDER — LACTATED RINGERS IV SOLN: INTRAVENOUS | Status: DC

## 2023-07-18 MED ORDER — DEXAMETHASONE SODIUM PHOSPHATE 10 MG/ML IJ SOLN
INTRAMUSCULAR | Status: AC
  Filled 2023-07-18: qty 1

## 2023-07-18 MED ORDER — LIDOCAINE HCL (PF) 2 % IJ SOLN
INTRAMUSCULAR | Status: AC
  Filled 2023-07-18: qty 5

## 2023-07-18 MED ORDER — EPHEDRINE 5 MG/ML INJ
INTRAVENOUS | Status: AC
  Filled 2023-07-18: qty 5

## 2023-07-18 MED ORDER — ACETAMINOPHEN 500 MG PO TABS
1000.0000 mg | ORAL_TABLET | ORAL | Status: AC
  Administered 2023-07-18: 1000 mg via ORAL

## 2023-07-18 MED ORDER — HYDROXYZINE HCL 25 MG PO TABS: 25.0000 mg | ORAL_TABLET | Freq: Three times a day (TID) | ORAL | 0 refills | Status: AC | PRN

## 2023-07-18 MED ORDER — OXYCODONE HCL 5 MG PO TABS
ORAL_TABLET | ORAL | Status: AC
  Filled 2023-07-18: qty 1

## 2023-07-18 MED ORDER — PROPOFOL 10 MG/ML IV BOLUS
INTRAVENOUS | Status: DC | PRN
  Administered 2023-07-18: 200 mg via INTRAVENOUS

## 2023-07-18 MED ORDER — CLONIDINE HCL 0.1 MG PO TABS: 0.1000 mg | ORAL_TABLET | Freq: Two times a day (BID) | ORAL | 0 refills | Status: DC | PRN

## 2023-07-18 MED ORDER — DEXMEDETOMIDINE HCL IN NACL 80 MCG/20ML IV SOLN
INTRAVENOUS | Status: DC | PRN
  Administered 2023-07-18: 4 ug via INTRAVENOUS

## 2023-07-18 SURGICAL SUPPLY — 24 items
ABLATOR SURESOUND NOVASURE (ABLATOR) IMPLANT
CATH ROBINSON RED A/P 14FR (CATHETERS) IMPLANT
CATH ROBINSON RED A/P 16FR (CATHETERS) IMPLANT
DEVICE MYOSURE LITE (MISCELLANEOUS) IMPLANT
DEVICE MYOSURE REACH (MISCELLANEOUS) IMPLANT
DILATOR CANAL MILEX (MISCELLANEOUS) IMPLANT
GAUZE 4X4 16PLY ~~LOC~~+RFID DBL (SPONGE) IMPLANT
GLOVE BIO SURGEON STRL SZ7 (GLOVE) ×1 IMPLANT
GLOVE BIOGEL PI IND STRL 7.0 (GLOVE) ×1 IMPLANT
GOWN STRL REUS W/ TWL XL LVL3 (GOWN DISPOSABLE) ×1 IMPLANT
IV NS IRRIG 3000ML ARTHROMATIC (IV SOLUTION) IMPLANT
KIT PROCEDURE FLUENT (KITS) ×1 IMPLANT
KIT TURNOVER CYSTO (KITS) ×1 IMPLANT
LOOP CUTTING BIPOLAR 21FR (ELECTRODE) IMPLANT
MYOSURE XL FIBROID (MISCELLANEOUS) IMPLANT
PACK VAGINAL MINOR WOMEN LF (CUSTOM PROCEDURE TRAY) ×1 IMPLANT
PAD OB MATERNITY 4.3X12.25 (PERSONAL CARE ITEMS) ×1 IMPLANT
SEAL CERVICAL OMNI LOK (ABLATOR) IMPLANT
SEAL ROD LENS SCOPE MYOSURE (ABLATOR) ×1 IMPLANT
SLEEVE SCD COMPRESS KNEE MED (STOCKING) ×1 IMPLANT
SUT VIC AB 3-0 SH 27XBRD (SUTURE) IMPLANT
SYSTEM TISS REMOVAL MYOSURE XL (MISCELLANEOUS) IMPLANT
TOWEL OR 17X24 6PK STRL BLUE (TOWEL DISPOSABLE) ×1 IMPLANT
WATER STERILE IRR 500ML POUR (IV SOLUTION) IMPLANT

## 2023-07-18 NOTE — Op Note (Signed)
 Nancy Terry PROCEDURE DATE: 07/18/2023  PREOPERATIVE DIAGNOSIS: abnormal uterine bleeding  POSTOPERATIVE DIAGNOSIS: abnormal uterine bleeding PROCEDURE:    operative hysteroscopy, dilation and curettage, novasure ablation SURGEON: Kiki Pelton, MD ASSISTANT:  n/a  INDICATIONS: 48 y.o. G3P3000 with AUB.  Risks of surgery were discussed with the patient including but not limited to: bleeding which may require transfusion; infection which may require antibiotics; injury to surrounding organs; need for additional procedures including laparotomy;  and other postoperative/anesthesia complications. Written informed consent was obtained.    FINDINGS:  Normal external genitalia, normal appearing cervix Hysteroscopically: proliferative endometrium with polypoid tissue, bilateral tubal ostia visualized  ANESTHESIA: General, paracervical block INTRAVENOUS FLUIDS:  500 ml of LR ESTIMATED BLOOD LOSS:  15 ml URINE OUTPUT: 10 ml SPECIMENS: endometrial curettigns COMPLICATIONS:  None immediate.  FLUID DEFICIT:265 cc normal saline  PROCEDURE DETAILS:  The patient was taken to the operating room where general anesthesia was administered and was found to be adequate.  After an adequate timeout was performed, she was placed in the dorsal lithotomy position and examined; then prepped and draped in the sterile manner.   . A speculum was then placed in the patient's vagina and a single tooth tenaculum was applied to the anterior lip of the cervix.  A paracervical block using 0.5% Marcaine  was administered.  The cervix was dilated manually with Hegar dilators to accommodate the hysteroscope.  Once the cervix was dilated, the hysteroscope was inserted under direct visualization.   The findings above were noted. The uterine cavity was carefully examined, both ostia were recognized. The myosure light was used to resect the endometrial tissue. The hysteroscope was removed.  The included sound was used to measure the  uterine cavity length, which were 4.5 cm.  The NovaSure device was inserted, and a cavity width of 4.8 cm was determined. Using a power of 119 watts, for 1:42 sec, the endometrial ablation was performed. The hysteroscope was then re-introduced into the uterine cavity, confirming 99% ablation of the endometrium. The tenaculum was removed from the anterior lip of the cervix and 2 figure of 8 stitches were placed in the anterior lip of the cervix where the tenaculum had pulled through and was bleeding. Once adequate hemostasis observed, vaginal speculum was removed after noting good hemostasis.  The patient tolerated the procedure well and was taken to the recovery area awake, extubated and in stable condition.    Kiki Pelton, MD Minimally Invasive Gynecologic Surgery  Obstetrics and Gynecology, [facility], [facility] 07/18/2023

## 2023-07-18 NOTE — Discharge Instructions (Addendum)
  Post Anesthesia Home Care Instructions  Activity: Get plenty of rest for the remainder of the day. A responsible individual must stay with you for 24 hours following the procedure.  For the next 24 hours, DO NOT: -Drive a car -Advertising copywriter -Drink alcoholic beverages -Take any medication unless instructed by your physician -Make any legal decisions or sign important papers.  Meals: Start with liquid foods such as gelatin or soup. Progress to regular foods as tolerated. Avoid greasy, spicy, heavy foods. If nausea and/or vomiting occur, drink only clear liquids until the nausea and/or vomiting subsides. Call your physician if vomiting continues.  Special Instructions/Symptoms: Your throat may feel dry or sore from the anesthesia or the breathing tube placed in your throat during surgery. If this causes discomfort, gargle with warm salt water . The discomfort should disappear within 24 hours.    D & C Home care Instructions:   Personal hygiene:  Used sanitary napkins for vaginal drainage not tampons. Shower or tub bathe the day after your procedure. No douching until bleeding stops. Always wipe from front to back after  Elimination.  Activity: Do not drive or operate any equipment today. The effects of the anesthesia are still present and drowsiness may result. Rest today, not necessarily flat bed rest, just take it easy. You may resume your normal activity in one to 2 days.  Sexual activity: No intercourse for one week or as indicated by your physician  Diet: Eat a light diet as desired this evening. You may resume a regular diet tomorrow.  Return to work: One to 2 days.  General Expectations of your surgery: Vaginal bleeding should be no heavier than a normal period. Spotting may continue up to 10 days. Mild cramps may continue for a couple of days. You may have a regular period in 2-6 weeks.  Unexpected observations call your doctor if these occur: persistent or heavy bleeding.  Severe abdominal cramping or pain. Elevation of temperature greater than 100F.  May take Tylenol  as needed for cramping/soreness beginning at 4:30 PM.

## 2023-07-18 NOTE — H&P (Signed)
 OB/GYN Pre-Op History and Physical  Nancy Terry is a 48 y.o. G3P3000 presenting for scheduled hysteroscopy and endometrial ablation.       Past Medical History:  Diagnosis Date   Anxiety    Depression    Hepatitis C    Herpes infection    Hypertension    Kidney stones    Migraines    Narcotic abuse (HCC)     Past Surgical History:  Procedure Laterality Date   BREAST BIOPSY     ELBOW SURGERY Left    KIDNEY STONE SURGERY     with stent placement   LITHOTRIPSY     TUBAL LIGATION      OB History  Gravida Para Term Preterm AB Living  3 3 3  0 0 0  SAB IAB Ectopic Multiple Live Births  0 0 0 0 0    # Outcome Date GA Lbr Len/2nd Weight Sex Type Anes PTL Lv  3 Term      Vag-Spont     2 Term      Vag-Spont     1 Term      Vag-Spont       Social History   Socioeconomic History   Marital status: Unknown    Spouse name: Not on file   Number of children: 3   Years of education: Not on file   Highest education level: 9th grade  Occupational History   Not on file  Tobacco Use   Smoking status: Every Day    Current packs/day: 1.00    Average packs/day: 1 pack/day for 0.5 years (0.5 ttl pk-yrs)    Types: Cigarettes   Smokeless tobacco: Never  Vaping Use   Vaping status: Never Used  Substance and Sexual Activity   Alcohol use: No   Drug use: Not Currently    Types: Heroin, Marijuana    Comment: hasn't used heroin since 06/01/2014   Sexual activity: Not Currently    Birth control/protection: Surgical  Other Topics Concern   Not on file  Social History Narrative   ** Merged History Encounter **       Social Drivers of Health   Financial Resource Strain: Not on file  Food Insecurity: No Food Insecurity (02/29/2020)   Hunger Vital Sign    Worried About Running Out of Food in the Last Year: Never true    Ran Out of Food in the Last Year: Never true  Transportation Needs: No Transportation Needs (02/29/2020)   PRAPARE - Administrator, Civil Service  (Medical): No    Lack of Transportation (Non-Medical): No  Physical Activity: Not on file  Stress: Not on file  Social Connections: Not on file    Family History  Problem Relation Age of Onset   Parkinson's disease Mother    Hypertension Mother    Rheum arthritis Mother    Heart failure Mother    Parkinson's disease Maternal Grandmother    Hypertension Maternal Grandmother    Glaucoma Maternal Grandmother    Lung cancer Maternal Grandmother    Colon cancer Neg Hx    Stomach cancer Neg Hx    Pancreatic cancer Neg Hx    Esophageal cancer Neg Hx     Facility-Administered Medications Prior to Admission  Medication Dose Route Frequency Provider Last Rate Last Admin   [DISCONTINUED] ibuprofen  (ADVIL ) tablet 800 mg  800 mg Oral Once Keileigh Vahey, MD       Medications Prior to Admission  Medication Sig Dispense Refill Last  Dose/Taking   ALPRAZolam (XANAX) 0.25 MG tablet Take 0.25 mg by mouth 2 (two) times daily.   07/18/2023 at  8:00 AM   ibuprofen  (ADVIL ) 200 MG tablet Take 200 mg by mouth every 6 (six) hours as needed.   Past Week   methadone (DOLOPHINE) 10 MG/5ML solution Take 175 mg by mouth daily.   07/18/2023 at  8:00 AM   hydrOXYzine  (ATARAX /VISTARIL ) 25 MG tablet Take 1 tablet (25 mg total) by mouth every 8 (eight) hours as needed for anxiety. (Patient not taking: Reported on 07/14/2023) 21 tablet 0 More than a month   megestrol  (MEGACE ) 40 MG tablet Take one tablet three times a day for 3 days, then one tablet twice daily for 3 days, then one tablet daily (Patient not taking: Reported on 09/20/2022) 90 tablet 3 More than a month   ondansetron  (ZOFRAN ) 4 MG tablet Take 1 tablet (4 mg total) by mouth every 6 (six) hours. (Patient not taking: Reported on 03/09/2023) 12 tablet 0 More than a month   sertraline (ZOLOFT) 25 MG tablet Take 25 mg by mouth daily. (Patient not taking: Reported on 06/30/2023)   More than a month    Allergies  Allergen Reactions   Haldol [Haloperidol  Decanoate] Other (See Comments)    Hallucinations   Nitrates, Organic Other (See Comments)    headaches    Review of Systems: Negative except for what is mentioned in HPI.     Physical Exam: BP (!) 152/70   Pulse (!) 55   Temp 97.8 F (36.6 C) (Oral)   Resp 18   Ht 5\' 4"  (1.626 m)   Wt 95.1 kg   LMP 07/04/2023   SpO2 95%   BMI 35.98 kg/m  CONSTITUTIONAL: Well-developed, well-nourished and in no acute distress.  HENT:  Normocephalic, atraumatic, External right and left ear normal. Oropharynx is clear and moist EYES: Conjunctivae and EOM are normal. Pupils are equal, round, and reactive to light. No scleral icterus.  NECK: Normal range of motion, supple, no masses SKIN: Skin is warm and dry. No rash noted. Not diaphoretic. No erythema. No pallor. NEUROLGIC: Alert and oriented to person, place, and time. Normal reflexes, muscle tone coordination. No cranial nerve deficit noted. PSYCHIATRIC: Normal mood and affect. Normal behavior. Normal judgment and thought content. RESPIRATORY: Normal effort PELVIC: Deferred   Pertinent Labs/Studies:   Results for orders placed or performed during the hospital encounter of 07/18/23 (from the past 72 hours)  Pregnancy, urine POC     Status: None   Collection Time: 07/18/23 10:05 AM  Result Value Ref Range   Preg Test, Ur NEGATIVE NEGATIVE    Comment:        THE SENSITIVITY OF THIS METHODOLOGY IS >24 mIU/mL        Assessment and Plan :Nancy Terry is a 48 y.o. G3P3000 here for surgical management of AUB.   Patient desires surgical management with operative hysteroscopy and endometrial ablation.  The risks of surgery were discussed in detail with the patient including but not limited to: bleeding which may require transfusion or reoperation; infection which may require prolonged hospitalization or re-hospitalization and antibiotic therapy; injury to bowel, bladder, ureters and major vessels or other surrounding organs which may lead to  other procedures; formation of adhesions; need for additional procedures including laparotomy or subsequent procedures secondary to intraoperative injury or abnormal pathology; thromboembolic phenomenon; incisional problems and other postoperative or anesthesia complications.  Patient was told that the likelihood that her condition and symptoms  will be treated effectively with this surgical management was moderate; the postoperative expectations were also discussed in detail. The patient also understands the alternative treatment options which were discussed in full.   Kathrina Crosley, M.D. Minimally Invasive Gynecologic Surgery and Pelvic Pain Specialist Attending Obstetrician & Gynecologist, [facility] [employer], [facility]

## 2023-07-18 NOTE — Brief Op Note (Signed)
 07/18/2023  12:46 PM  PATIENT:  Nancy Terry  48 y.o. female  PRE-OPERATIVE DIAGNOSIS:  Abnormal uterine bleeding  POST-OPERATIVE DIAGNOSIS:  * No post-op diagnosis entered *  PROCEDURE:  Procedure(s): DILATATION & CURETTAGE/HYSTEROSCOPY WITH NOVASURE ABLATION (N/A)  SURGEON:  Surgeons and Role:    Kiki Pelton, MD - Primary  PHYSICIAN ASSISTANT: n/a  ASSISTANTS: none   ANESTHESIA:   general and paracervical block  EBL:  25 mL   BLOOD ADMINISTERED:none  DRAINS: none   LOCAL MEDICATIONS USED:  BUPIVICAINE   SPECIMEN:  Source of Specimen:  endometrial curettings  DISPOSITION OF SPECIMEN:  PATHOLOGY  COUNTS:  YES  TOURNIQUET:  * No tourniquets in log *  DICTATION: .Note written in EPIC  PLAN OF CARE: Discharge to home after PACU  PATIENT DISPOSITION:  PACU - hemodynamically stable.   Delay start of Pharmacological VTE agent (>24hrs) due to surgical blood loss or risk of bleeding: not applicable

## 2023-07-18 NOTE — Anesthesia Postprocedure Evaluation (Signed)
 Anesthesia Post Note  Patient: Nancy Terry  Procedure(s) Performed: DILATATION & CURETTAGE/HYSTEROSCOPY WITH NOVASURE ABLATION (Vagina )     Patient location during evaluation: PACU Anesthesia Type: General Level of consciousness: awake and alert Pain management: pain level controlled Vital Signs Assessment: post-procedure vital signs reviewed and stable Respiratory status: spontaneous breathing, nonlabored ventilation, respiratory function stable and patient connected to nasal cannula oxygen Cardiovascular status: blood pressure returned to baseline and stable Postop Assessment: no apparent nausea or vomiting Anesthetic complications: no   No notable events documented.  Last Vitals:  Vitals:   07/18/23 1345 07/18/23 1435  BP: 117/64 137/73  Pulse: 61 85  Resp: 17 16  Temp:  36.6 C  SpO2: 95% 93%    Last Pain:  Vitals:   07/18/23 1435  TempSrc:   PainSc: 6                  Rosalita Combe

## 2023-07-18 NOTE — Transfer of Care (Signed)
 Immediate Anesthesia Transfer of Care Note  Patient: Nancy Terry  Procedure(s) Performed: DILATATION & CURETTAGE/HYSTEROSCOPY WITH NOVASURE ABLATION  Patient Location: PACU  Anesthesia Type:General  Level of Consciousness: drowsy and patient cooperative  Airway & Oxygen Therapy: Patient Spontanous Breathing and Patient connected to nasal cannula oxygen  Post-op Assessment: Report given to RN and Post -op Vital signs reviewed and stable  Post vital signs: Reviewed and stable  Last Vitals:  Vitals Value Taken Time  BP 116/66 07/18/23 1255  Temp 35.8 C 07/18/23 1255  Pulse 60 07/18/23 1258  Resp 5 07/18/23 1258  SpO2 98 % 07/18/23 1258  Vitals shown include unfiled device data.  Last Pain:  Vitals:   07/18/23 1255  TempSrc:   PainSc: 0-No pain      Patients Stated Pain Goal: 3 (07/18/23 1019)  Complications: No notable events documented.

## 2023-07-18 NOTE — Anesthesia Procedure Notes (Signed)
 Procedure Name: LMA Insertion Date/Time: 07/18/2023 12:00 PM  Performed by: Lonia Ro, CRNAPre-anesthesia Checklist: Patient identified, Patient being monitored, Emergency Drugs available, Timeout performed and Suction available Patient Re-evaluated:Patient Re-evaluated prior to induction Oxygen Delivery Method: Circle System Utilized Preoxygenation: Pre-oxygenation with 100% oxygen Induction Type: IV induction Ventilation: Mask ventilation without difficulty LMA: LMA inserted LMA Size: 4.0 Number of attempts: 1 Placement Confirmation: positive ETCO2 and breath sounds checked- equal and bilateral

## 2023-07-19 ENCOUNTER — Encounter (HOSPITAL_BASED_OUTPATIENT_CLINIC_OR_DEPARTMENT_OTHER): Payer: Self-pay | Admitting: Obstetrics and Gynecology

## 2023-07-19 LAB — SURGICAL PATHOLOGY

## 2023-07-20 ENCOUNTER — Encounter: Payer: Self-pay | Admitting: Obstetrics and Gynecology

## 2023-07-22 ENCOUNTER — Telehealth: Payer: Self-pay | Admitting: Family Medicine

## 2023-07-22 NOTE — Telephone Encounter (Signed)
Patient called wanting to be seen by Dr.Ajewole today she says she is having some complications after surgery on 07/18/23. I informed the patient that unfortunately Dr.Ajewole is not in this office today and asked if the patient can tell me a little about the complications that she is experiencing. Patient says that it feels like she has been "cut down there". She is feeling severe cramping all over her body, there is a very foul odor coming from down there and there is very little bleeding and some discharge. She was seen by her PCP yesterday and he gave her a shot that helped a little bit with the cramping and stopped the bleeding that had been coming from her ears but it was not much he could do but test her for the flu and covid-19. I offered the patient an appointment with Dr.Ajewole next week due to a cancellation. Patient accepted the appointment and said she will try her very best to be there. Patient says that she will finish taking the antibiotics prescribed by Dr.Ajewole until her appointment next week on 07/28/23.

## 2023-07-25 NOTE — BH Specialist Note (Signed)
 Pt did not arrive to video visit and did not answer the phone; Left HIPPA-compliant message to call back Asher Muir from [employer] for [employer] at [facility] at  [phone] [facility] office).  ?; left MyChart message for patient.  ? ?

## 2023-07-28 ENCOUNTER — Ambulatory Visit: Payer: MEDICAID | Admitting: Obstetrics and Gynecology

## 2023-07-28 ENCOUNTER — Ambulatory Visit (INDEPENDENT_AMBULATORY_CARE_PROVIDER_SITE_OTHER): Payer: MEDICAID | Admitting: Obstetrics and Gynecology

## 2023-07-28 VITALS — BP 118/72 | HR 67 | Wt 214.0 lb

## 2023-07-28 DIAGNOSIS — N939 Abnormal uterine and vaginal bleeding, unspecified: Secondary | ICD-10-CM | POA: Diagnosis not present

## 2023-07-28 DIAGNOSIS — Z09 Encounter for follow-up examination after completed treatment for conditions other than malignant neoplasm: Secondary | ICD-10-CM

## 2023-07-28 NOTE — Progress Notes (Signed)
   POSTOPERATIVE VISIT NOTE   Subjective:     Nancy Terry is a 48 y.o. G3P3000 who presents to the clinic 2 weeks status post operative hysteroscopy and endometrial ablation  for abnormal uterine bleeding. Eating a regular diet without difficulty. Bowel movements are normal. The patient is not having any pain. Incision: n/a Vaginal bleeding: minimal spotting Resumed sexual acitivity: no  The following portions of the patient's history were reviewed and updated as appropriate: allergies, current medications, past family history, past medical history, past social history, past surgical history, and problem list..   Review of Systems Pertinent items are noted in HPI.    Objective:    BP 118/72   Pulse 67   Wt 214 lb (97.1 kg)   LMP 07/04/2023   BMI 36.73 kg/m  General:  alert, cooperative, and no distress  Incision:   N/a  Pelvic:   Exam deferred.    Pathology Results: FINAL MICROSCOPIC DIAGNOSIS:   A. ENDOMETRIAL CURETTINGS:  Disordered proliferative phase endometrium  Stromal breakdown and fibrin thrombi consistent with bleeding  Negative for polyp, atypia, hyperplasia and carcinoma    Assessment:   Doing well postoperatively. Operative findings again reviewed. Pathology report discussed.   Plan:    1. Postop check (Primary) Doing well and meeting postop goals  2. Abnormal uterine bleeding (AUB) Now just having spotting, to be expected following hysteroscopy and ablation Will monitor menses going forward and will follow up if bleeding is prolonged or heavy again, noting it may take a few months to see improvement   Activity restrictions:  pelvic rest for 4 weeks total postop Follow up: as needed  Lorriane Shire, MD Obstetrician & Gynecologist, [facility] for [employer], [facility]

## 2023-07-30 ENCOUNTER — Other Ambulatory Visit: Payer: Self-pay | Admitting: Obstetrics and Gynecology

## 2023-08-03 ENCOUNTER — Ambulatory Visit: Payer: MEDICAID | Admitting: Clinical

## 2023-08-03 DIAGNOSIS — Z91199 Patient's noncompliance with other medical treatment and regimen due to unspecified reason: Secondary | ICD-10-CM

## 2023-08-17 ENCOUNTER — Ambulatory Visit: Payer: MEDICAID | Admitting: Obstetrics and Gynecology

## 2023-08-18 ENCOUNTER — Other Ambulatory Visit (HOSPITAL_COMMUNITY)
Admission: RE | Admit: 2023-08-18 | Discharge: 2023-08-18 | Disposition: A | Discharge status: Home / Self Care | Payer: MEDICAID | Source: Ambulatory Visit | Attending: Obstetrics and Gynecology | Admitting: Obstetrics and Gynecology

## 2023-08-18 ENCOUNTER — Other Ambulatory Visit: Payer: Self-pay

## 2023-08-18 ENCOUNTER — Ambulatory Visit: Payer: MEDICAID | Admitting: Obstetrics and Gynecology

## 2023-08-18 VITALS — BP 147/73 | HR 58 | Temp 98.2°F | Ht 64.0 in | Wt 207.0 lb

## 2023-08-18 DIAGNOSIS — N760 Acute vaginitis: Secondary | ICD-10-CM

## 2023-08-18 DIAGNOSIS — Z09 Encounter for follow-up examination after completed treatment for conditions other than malignant neoplasm: Secondary | ICD-10-CM | POA: Diagnosis not present

## 2023-08-18 DIAGNOSIS — N939 Abnormal uterine and vaginal bleeding, unspecified: Secondary | ICD-10-CM

## 2023-08-18 LAB — POCT URINALYSIS DIP (DEVICE)
Bilirubin Urine: NEGATIVE
Glucose, UA: NEGATIVE mg/dL
Ketones, ur: NEGATIVE mg/dL
Nitrite: NEGATIVE
Protein, ur: NEGATIVE mg/dL
Specific Gravity, Urine: 1.02 (ref 1.005–1.030)
Urobilinogen, UA: 0.2 mg/dL (ref 0.0–1.0)
pH: 6 (ref 5.0–8.0)

## 2023-08-18 NOTE — Progress Notes (Unsigned)
 GYNECOLOGY VISIT  Patient name: Nancy Terry MRN [id number]  Date of birth: August 09, 1975 Chief Complaint:   Follow-up (Postop 4 weeks/Pt stated--spotting, burning, itching, odor. Declined pain/fever.)   History:  Nancy Terry is a 48 y.o. G3P3000 being seen today for bleeding and vaginal odor.  Rnadom spotting here and there  Rotten odor coming from the vagina scine the procedure. Everything esles has subsided . Has purchased the over the counter boric acid. Every once in a while will have some watery discharge and mabye small tissue here and there.   Having itching in bilateral groin itching, has been applying vagisil and seeing improvement  Past Medical History:  Diagnosis Date   Anxiety    Depression    Hepatitis C    Herpes infection    Hypertension    Kidney stones    Migraines    Narcotic abuse (HCC)     Past Surgical History:  Procedure Laterality Date   BREAST BIOPSY     DILITATION & CURRETTAGE/HYSTROSCOPY WITH NOVASURE ABLATION N/A 07/18/2023   Procedure: DILATATION & CURETTAGE/HYSTEROSCOPY WITH NOVASURE ABLATION;  Surgeon: Lorriane Shire, MD;  Location: [facility];  Service: Gynecology;  Laterality: N/A;   ELBOW SURGERY Left    KIDNEY STONE SURGERY     with stent placement   LITHOTRIPSY     TUBAL LIGATION      The following portions of the patient's history were reviewed and updated as appropriate: allergies, current medications, past family history, past medical history, past social history, past surgical history and problem list.   Health Maintenance:   Last pap     Component Value Date/Time   DIAGPAP  03/30/2021 1108    - Negative for intraepithelial lesion or malignancy (NILM)   DIAGPAP  08/14/2019 1356    - Negative for intraepithelial lesion or malignancy (NILM)   HPVHIGH Negative 03/30/2021 1108   HPVHIGH Positive (A) 08/14/2019 1356   ADEQPAP  03/30/2021 1108    Satisfactory for evaluation; transformation zone component PRESENT.    ADEQPAP  08/14/2019 1356    Satisfactory for evaluation; transformation zone component PRESENT.    High Risk HPV: Positive  Adequacy:  Satisfactory for evaluation, transformation zone component PRESENT  Diagnosis:  Atypical squamous cells of undetermined significance (ASC-US)  Last mammogram: 04/2023 BIRADS1   Review of Systems:  Pertinent items are noted in HPI. Comprehensive review of systems was otherwise negative.   Objective:  Physical Exam BP (!) 147/73 (BP Location: Right Arm, Patient Position: Sitting, Cuff Size: Normal)   Pulse (!) 58   Temp 98.2 F (36.8 C)   Ht 5\' 4"  (1.626 m)   Wt 207 lb (93.9 kg)   SpO2 97%   BMI 35.53 kg/m    Physical Exam Vitals and nursing note reviewed.  Constitutional:      Appearance: Normal appearance.  HENT:     Head: Normocephalic and atraumatic.  Pulmonary:     Effort: Pulmonary effort is normal.  Skin:    General: Skin is warm and dry.  Neurological:     General: No focal deficit present.     Mental Status: She is alert.  Psychiatric:        Mood and Affect: Mood normal.        Behavior: Behavior normal.        Thought Content: Thought content normal.        Judgment: Judgment normal.         Assessment & Plan:  1. Acute vaginitis (Primary) Self swab completed - Cervicovaginal ancillary only  2. Postop check Doing well  3. Abnormal uterine bleeding (AUB) Now only have spotting and much preferred over prior bleeding pattern   Routine preventative health maintenance measures emphasized.  Lorriane Shire, MD [facility] for [facility], [facility]

## 2023-08-19 ENCOUNTER — Encounter: Payer: Self-pay | Admitting: Obstetrics and Gynecology

## 2023-08-19 LAB — CERVICOVAGINAL ANCILLARY ONLY
Chlamydia: NEGATIVE
Comment: NEGATIVE
Comment: NEGATIVE
Comment: NORMAL
Neisseria Gonorrhea: NEGATIVE
Trichomonas: NEGATIVE

## 2023-08-22 ENCOUNTER — Telehealth: Payer: Self-pay | Admitting: Family Medicine

## 2023-08-22 NOTE — Telephone Encounter (Signed)
 Patient called saying she is unable to use her mychart and would like for a nurse to give her a call with her test results from her visit on 3/13 with Dr.Ajewole.

## 2023-08-22 NOTE — Telephone Encounter (Signed)
 Pt notified of normal results.  Pt verbalized understanding with no further questions.   Nancy Terry

## 2023-10-05 ENCOUNTER — Encounter: Payer: Self-pay | Admitting: Emergency Medicine

## 2023-10-05 ENCOUNTER — Ambulatory Visit
Admission: EM | Admit: 2023-10-05 | Discharge: 2023-10-05 | Disposition: A | Discharge status: Home / Self Care | Payer: MEDICAID

## 2023-10-05 ENCOUNTER — Ambulatory Visit: Payer: MEDICAID | Admitting: Radiology

## 2023-10-05 ENCOUNTER — Ambulatory Visit: Payer: MEDICAID | Admitting: Obstetrics and Gynecology

## 2023-10-05 ENCOUNTER — Other Ambulatory Visit: Payer: Self-pay

## 2023-10-05 VITALS — BP 168/91 | HR 58 | Wt 212.0 lb

## 2023-10-05 DIAGNOSIS — Z0000 Encounter for general adult medical examination without abnormal findings: Secondary | ICD-10-CM | POA: Diagnosis not present

## 2023-10-05 DIAGNOSIS — M79601 Pain in right arm: Secondary | ICD-10-CM

## 2023-10-05 DIAGNOSIS — J069 Acute upper respiratory infection, unspecified: Secondary | ICD-10-CM | POA: Diagnosis not present

## 2023-10-05 DIAGNOSIS — N958 Other specified menopausal and perimenopausal disorders: Secondary | ICD-10-CM

## 2023-10-05 LAB — POC COVID19/FLU A&B COMBO
Covid Antigen, POC: NEGATIVE
Influenza A Antigen, POC: NEGATIVE
Influenza B Antigen, POC: NEGATIVE

## 2023-10-05 MED ORDER — PREDNISONE 20 MG PO TABS: 40.0000 mg | ORAL_TABLET | Freq: Every day | ORAL | 0 refills | Status: AC

## 2023-10-05 NOTE — ED Provider Notes (Addendum)
 [facility]    CSN: [id number] Arrival date & time: 10/05/23  1533      History   Chief Complaint Chief Complaint  Patient presents with   Headache    HPI Nancy Terry is a 48 y.o. female.   Patient here today for evaluation of headache, body aches that they have had for 3 days.  She reports that she went to a family gathering and is worried that she might have been exposed to COVID.    She also reports some pain in her right arm after her dog pulled her and her arm hit a tree.  She notes that most of her pain is located in her right elbow and surrounding area.  She is able to move her elbow but has pain in doing so.  She denies any numbness or tingling.  The history is provided by the patient.  Headache Associated symptoms: congestion, cough, myalgias and sore throat   Associated symptoms: no abdominal pain, no diarrhea, no ear pain, no fever, no nausea and no vomiting     Past Medical History:  Diagnosis Date   Anxiety    Depression    Hepatitis C    Herpes infection    Hypertension    Kidney stones    Migraines    Narcotic abuse (HCC)     Patient Active Problem List   Diagnosis Date Noted   Dysfunctional uterine bleeding 07/18/2023   Iron  deficiency anemia, unspecified 03/14/2023   Anemia 03/10/2023   Skin ulcer, limited to breakdown of skin (HCC) 10/08/2021   Cannabis hyperemesis syndrome concurrent with and due to cannabis abuse (HCC) 10/08/2021   Morbid obesity (HCC) 10/08/2021   Hidradenitis 10/10/2019   Tobacco use 10/10/2019   Chronic pain syndrome 04/11/2018   Complex ovarian cyst 11/21/2017   Hypertension 11/21/2017   Menorrhagia 05/13/2017   Sciatica 05/13/2017   Migraines 05/07/2016   Hepatitis C 05/07/2016   Depression 04/05/2016   Anxiety and depression 04/05/2016   Narcotic abuse (HCC) 04/05/2016   Breast lump on right side at 10 o'clock position 03/12/2014   Pap smear of cervix shows high risk HPV present 03/12/2014    Past  Surgical History:  Procedure Laterality Date   BREAST BIOPSY     DILITATION & CURRETTAGE/HYSTROSCOPY WITH NOVASURE ABLATION N/A 07/18/2023   Procedure: DILATATION & CURETTAGE/HYSTEROSCOPY WITH NOVASURE ABLATION;  Surgeon: Kiki Pelton, MD;  Location: [facility];  Service: Gynecology;  Laterality: N/A;   ELBOW SURGERY Left    KIDNEY STONE SURGERY     with stent placement   LITHOTRIPSY     TUBAL LIGATION      OB History     Gravida  3   Para  3   Term  3   Preterm  0   AB  0   Living  0      SAB  0   IAB  0   Ectopic  0   Multiple  0   Live Births  0            Home Medications    Prior to Admission medications   Medication Sig Start Date End Date Taking? Authorizing Provider  cholecalciferol (VITAMIN D3) 25 MCG (1000 UNIT) tablet Take 1,000 Units by mouth daily.   Yes [provider]  predniSONE  (DELTASONE ) 20 MG tablet Take 2 tablets (40 mg total) by mouth daily with breakfast for 5 days. 10/05/23 10/10/23 Yes Vernestine Gondola, PA-C  ALPRAZolam (  XANAX) 0.25 MG tablet Take 0.25 mg by mouth 2 (two) times daily. 06/14/22   [provider]  cloNIDine  (CATAPRES ) 0.1 MG tablet Take 1 tablet (0.1 mg total) by mouth 2 (two) times daily as needed. Patient not taking: Reported on 10/05/2023 07/18/23   Ajewole, Christana, MD  gabapentin  (NEURONTIN ) 300 MG capsule Take 1 capsule (300 mg total) by mouth 2 (two) times daily as needed. Patient not taking: Reported on 10/05/2023 07/18/23   Ajewole, Christana, MD  hydrOXYzine  (ATARAX ) 25 MG tablet Take 1 tablet (25 mg total) by mouth every 8 (eight) hours as needed for anxiety. Patient not taking: Reported on 10/05/2023 07/18/23   Ajewole, Christana, MD  ibuprofen  (ADVIL ) 600 MG tablet Take 1 tablet (600 mg total) by mouth every 6 (six) hours as needed. 07/18/23   Ajewole, Christana, MD  methadone (DOLOPHINE) 10 MG/5ML solution Take 175 mg by mouth daily.    [provider]  ondansetron   (ZOFRAN ) 4 MG tablet Take 1 tablet (4 mg total) by mouth every 6 (six) hours. Patient not taking: Reported on 10/05/2023 08/20/22   Olson, Caitlin, MD  sertraline (ZOLOFT) 25 MG tablet Take 25 mg by mouth daily. Patient not taking: Reported on 10/05/2023 06/16/23   [provider]    Family History Family History  Problem Relation Age of Onset   Parkinson's disease Mother    Hypertension Mother    Rheum arthritis Mother    Heart failure Mother    Parkinson's disease Maternal Grandmother    Hypertension Maternal Grandmother    Glaucoma Maternal Grandmother    Lung cancer Maternal Grandmother    Colon cancer Neg Hx    Stomach cancer Neg Hx    Pancreatic cancer Neg Hx    Esophageal cancer Neg Hx     Social History Social History   Tobacco Use   Smoking status: Every Day    Current packs/day: 1.00    Average packs/day: 1 pack/day for 0.5 years (0.5 ttl pk-yrs)    Types: Cigarettes   Smokeless tobacco: Never  Vaping Use   Vaping status: Never Used  Substance Use Topics   Alcohol use: No   Drug use: Not Currently    Types: Heroin, Marijuana    Comment: hasn't used heroin since 06/01/2014     Allergies   Haldol [haloperidol decanoate] and Nitrates, organic   Review of Systems Review of Systems  Constitutional:  Negative for chills and fever.  HENT:  Positive for congestion and sore throat. Negative for ear pain.   Eyes:  Negative for discharge and redness.  Respiratory:  Positive for cough. Negative for shortness of breath and wheezing.   Gastrointestinal:  Negative for abdominal pain, diarrhea, nausea and vomiting.  Musculoskeletal:  Positive for myalgias.  Neurological:  Positive for headaches.     Physical Exam Triage Vital Signs ED Triage Vitals  Encounter Vitals Group     BP 10/05/23 1557 (!) 171/78     Systolic BP Percentile --      Diastolic BP Percentile --      Pulse Rate 10/05/23 1557 (!) 47     Resp 10/05/23 1557 16     Temp 10/05/23 1557  98.1 F (36.7 C)     Temp Source 10/05/23 1557 Oral     SpO2 10/05/23 1557 96 %     Weight --      Height --      Head Circumference --      Peak Flow --  Pain Score 10/05/23 1556 7     Pain Loc --      Pain Education --      Exclude from Growth Chart --    No data found.  Updated Vital Signs BP (!) 171/78 (BP Location: Right Arm)   Pulse (!) 47   Temp 98.1 F (36.7 C) (Oral)   Resp 16   SpO2 96%   Visual Acuity Right Eye Distance:   Left Eye Distance:   Bilateral Distance:    Right Eye Near:   Left Eye Near:    Bilateral Near:     Physical Exam Vitals and nursing note reviewed.  Constitutional:      General: She is not in acute distress.    Appearance: Normal appearance. She is not ill-appearing.  HENT:     Head: Normocephalic and atraumatic.     Right Ear: Tympanic membrane normal.     Left Ear: Tympanic membrane normal.     Nose: Congestion present.     Mouth/Throat:     Mouth: Mucous membranes are moist.     Pharynx: No oropharyngeal exudate or posterior oropharyngeal erythema.  Eyes:     Conjunctiva/sclera: Conjunctivae normal.  Cardiovascular:     Rate and Rhythm: Normal rate and regular rhythm.     Heart sounds: Normal heart sounds. No murmur heard. Pulmonary:     Effort: Pulmonary effort is normal. No respiratory distress.     Breath sounds: Normal breath sounds. No wheezing, rhonchi or rales.  Musculoskeletal:     Comments: Mildly decreased range of motion of right elbow due to pain, no apparent swelling  Skin:    General: Skin is warm and dry.  Neurological:     Mental Status: She is alert.  Psychiatric:        Mood and Affect: Mood normal.        Behavior: Behavior normal.        Thought Content: Thought content normal.      UC Treatments / Results  Labs (all labs ordered are listed, but only abnormal results are displayed) Labs Reviewed  POC COVID19/FLU A&B COMBO    EKG   Radiology DG Elbow Complete Right Result Date:  10/05/2023 CLINICAL DATA:  Right arm pain EXAM: RIGHT ELBOW - COMPLETE 3+ VIEW COMPARISON:  None Available. FINDINGS: Tiny spur off of the coronoid process. Right elbow is otherwise intact. No evidence of acute fracture or dislocation. No focal bone lesion or bone destruction. Bone cortex appears intact. Joint spaces are normal. No significant effusions. IMPRESSION: Tiny bone spur off of the coronoid process. No acute bony abnormalities. Electronically Signed   By: Boyce Byes M.D.   On: 10/05/2023 17:15    Procedures Procedures (including critical care time)  Medications Ordered in UC Medications - No data to display  Initial Impression / Assessment and Plan / UC Course  I have reviewed the triage vital signs and the nursing notes.  Pertinent labs & imaging results that were available during my care of the patient were reviewed by me and considered in my medical decision making (see chart for details).   COVID and flu screening negative in office.  Suspect likely other viral etiology of upper respiratory infection.  Advised symptomatic treatment, increase fluids and rest.  X-ray ordered of right elbow with no acute findings.  Treat with steroids to hopefully improve symptoms and inflammation.  Advise to continue symptomatic treatment as needed and follow-up with Ortho if no gradual improvement.  Final Clinical Impressions(s) / UC Diagnoses   Final diagnoses:  Right arm pain  Acute upper respiratory infection   Discharge Instructions   None    ED Prescriptions     Medication Sig Dispense Auth. Provider   predniSONE  (DELTASONE ) 20 MG tablet Take 2 tablets (40 mg total) by mouth daily with breakfast for 5 days. 10 tablet Vernestine Gondola, PA-C      PDMP not reviewed this encounter.   Vernestine Gondola, PA-C 10/06/23 1938    Vernestine Gondola, PA-C 10/06/23 [phone]

## 2023-10-05 NOTE — ED Triage Notes (Signed)
 Pt c/o headache, bodyaches for 3 days. States they went to a family gathering and started feeling bad after that. Wants COVID test.  She also c/o right arm pain after dog pulled her and arm hit a tree.

## 2023-10-05 NOTE — Progress Notes (Signed)
    GYNECOLOGY VISIT  Patient name: Nancy Terry MRN [id number]  Date of birth: 1975/10/27 Chief Complaint:   No chief complaint on file.   History:  Nancy Terry is a 48 y.o. G3P3000 being seen today for multiple concerns: bloating, migraines, and hot flashes. Not having any bleeding since procedure. Having symptoms that she gets with her menses, having back pain and cramping and hot flashes. Still using tobacco prodcuts. Wondering if her usual menstrual symptoms. Feels like her abdomen is tight; currently looking for new PCP. Still having some discharge; noticed some mild dryness   Past Medical History:  Diagnosis Date   Anxiety    Depression    Hepatitis C    Herpes infection    Hypertension    Kidney stones    Migraines    Narcotic abuse (HCC)     Past Surgical History:  Procedure Laterality Date   BREAST BIOPSY     DILITATION & CURRETTAGE/HYSTROSCOPY WITH NOVASURE ABLATION N/A 07/18/2023   Procedure: DILATATION & CURETTAGE/HYSTEROSCOPY WITH NOVASURE ABLATION;  Surgeon: Kiki Pelton, MD;  Location: [facility];  Service: Gynecology;  Laterality: N/A;   ELBOW SURGERY Left    KIDNEY STONE SURGERY     with stent placement   LITHOTRIPSY     TUBAL LIGATION      The following portions of the patient's history were reviewed and updated as appropriate: allergies, current medications, past family history, past medical history, past social history, past surgical history and problem list.     Review of Systems:  Pertinent items are noted in HPI. Comprehensive review of systems was otherwise negative.   Objective:  Physical Exam BP (!) 168/91   Pulse (!) 58   Wt 212 lb (96.2 kg)   BMI 36.39 kg/m    Physical Exam Vitals and nursing note reviewed.  Constitutional:      Appearance: Normal appearance.  HENT:     Head: Normocephalic and atraumatic.  Pulmonary:     Effort: Pulmonary effort is normal.  Skin:    General: Skin is warm and dry.   Neurological:     General: No focal deficit present.     Mental Status: She is alert.  Psychiatric:        Mood and Affect: Mood normal.        Behavior: Behavior normal.        Thought Content: Thought content normal.        Judgment: Judgment normal.         Assessment & Plan:  1. Genitourinary syndrome of menopause (Primary) Recommend using vaginal moisturize or coconut oil. Noted that HRT not likely safe with current co-morbidities. Menopause may be contributing to some symptoms but would need to treat individual in lieu of HRT.  2. Healthcare maintenance Would like to establish with new primary care, referral placed - Ambulatory referral to [facility]   Routine preventative health maintenance measures emphasized.  Kiki Pelton, MD [facility]ally [facility]vasive [facility]nec[facility] for [facility], [facility]

## 2023-10-06 ENCOUNTER — Encounter: Payer: Self-pay | Admitting: Physician Assistant

## 2023-11-07 ENCOUNTER — Ambulatory Visit
Admission: EM | Admit: 2023-11-07 | Discharge: 2023-11-07 | Disposition: A | Discharge status: Home / Self Care | Payer: MEDICAID | Attending: Emergency Medicine | Admitting: Emergency Medicine

## 2023-11-07 ENCOUNTER — Encounter: Payer: Self-pay | Admitting: Emergency Medicine

## 2023-11-07 DIAGNOSIS — H9202 Otalgia, left ear: Secondary | ICD-10-CM

## 2023-11-07 DIAGNOSIS — J069 Acute upper respiratory infection, unspecified: Secondary | ICD-10-CM | POA: Diagnosis not present

## 2023-11-07 LAB — POC SARS CORONAVIRUS 2 AG -  ED: SARS Coronavirus 2 Ag: NEGATIVE

## 2023-11-07 MED ORDER — GUAIFENESIN ER 600 MG PO TB12: 600.0000 mg | ORAL_TABLET | Freq: Two times a day (BID) | ORAL | 0 refills | Status: AC

## 2023-11-07 MED ORDER — BENZONATATE 100 MG PO CAPS: 100.0000 mg | ORAL_CAPSULE | Freq: Three times a day (TID) | ORAL | 0 refills | Status: AC

## 2023-11-07 NOTE — ED Provider Notes (Signed)
 [facility]    CSN: [id number] Arrival date & time: 11/07/23  1021      History   Chief Complaint Chief Complaint  Patient presents with   Cough    HPI Nancy Terry is a 48 y.o. female.   Patient presents to clinic over concern of cough, congestion, left ear pain, headache and feeling unwell for the past 3 days.  On Thursday she did visit her mother in the hospital and put out some balls.  Was unsure if she is having a reaction to the mothballs, would like COVID-19 testing.  Has not had any fevers.  Has been taking 200 mg of ibuprofen  and 325 mg of Tylenol  for the headache. Has not had any wheezing or shortness of breath.  History of anxiety, depression, hepatitis, hypertension, narcotic abuse and cannabis hyperemesis.  Is currently on methadone.  The history is provided by the patient and medical records.  Cough   Past Medical History:  Diagnosis Date   Anxiety    Depression    Hepatitis C    Herpes infection    Hypertension    Kidney stones    Migraines    Narcotic abuse (HCC)     Patient Active Problem List   Diagnosis Date Noted   Dysfunctional uterine bleeding 07/18/2023   Iron  deficiency anemia, unspecified 03/14/2023   Anemia 03/10/2023   Skin ulcer, limited to breakdown of skin (HCC) 10/08/2021   Cannabis hyperemesis syndrome concurrent with and due to cannabis abuse (HCC) 10/08/2021   Morbid obesity (HCC) 10/08/2021   Hidradenitis 10/10/2019   Tobacco use 10/10/2019   Chronic pain syndrome 04/11/2018   Complex ovarian cyst 11/21/2017   Hypertension 11/21/2017   Menorrhagia 05/13/2017   Sciatica 05/13/2017   Migraines 05/07/2016   Hepatitis C 05/07/2016   Depression 04/05/2016   Anxiety and depression 04/05/2016   Narcotic abuse (HCC) 04/05/2016   Breast lump on right side at 10 o'clock position 03/12/2014   Pap smear of cervix shows high risk HPV present 03/12/2014    Past Surgical History:  Procedure Laterality Date   BREAST  BIOPSY     DILITATION & CURRETTAGE/HYSTROSCOPY WITH NOVASURE ABLATION N/A 07/18/2023   Procedure: DILATATION & CURETTAGE/HYSTEROSCOPY WITH NOVASURE ABLATION;  Surgeon: Kiki Pelton, MD;  Location: [facility];  Service: Gynecology;  Laterality: N/A;   ELBOW SURGERY Left    KIDNEY STONE SURGERY     with stent placement   LITHOTRIPSY     TUBAL LIGATION      OB History     Gravida  3   Para  3   Term  3   Preterm  0   AB  0   Living  0      SAB  0   IAB  0   Ectopic  0   Multiple  0   Live Births  0            Home Medications    Prior to Admission medications   Medication Sig Start Date End Date Taking? Authorizing Provider  benzonatate (TESSALON) 100 MG capsule Take 1 capsule (100 mg total) by mouth every 8 (eight) hours. 11/07/23  Yes Kylah Maresh  N, FNP  guaiFENesin (MUCINEX) 600 MG 12 hr tablet Take 1 tablet (600 mg total) by mouth 2 (two) times daily for 5 days. 11/07/23 11/12/23 Yes Stellarose Cerny  N, FNP  ALPRAZolam (XANAX) 0.25 MG tablet Take 0.25 mg by mouth 2 (two) times daily. 06/14/22  [provider]  cholecalciferol (VITAMIN D3) 25 MCG (1000 UNIT) tablet Take 1,000 Units by mouth daily.    [provider]  cloNIDine  (CATAPRES ) 0.1 MG tablet Take 1 tablet (0.1 mg total) by mouth 2 (two) times daily as needed. Patient not taking: Reported on 10/05/2023 07/18/23   Ajewole, Christana, MD  gabapentin  (NEURONTIN ) 300 MG capsule Take 1 capsule (300 mg total) by mouth 2 (two) times daily as needed. Patient not taking: Reported on 10/05/2023 07/18/23   Ajewole, Christana, MD  hydrOXYzine  (ATARAX ) 25 MG tablet Take 1 tablet (25 mg total) by mouth every 8 (eight) hours as needed for anxiety. Patient not taking: Reported on 10/05/2023 07/18/23   Ajewole, Christana, MD  ibuprofen  (ADVIL ) 600 MG tablet Take 1 tablet (600 mg total) by mouth every 6 (six) hours as needed. 07/18/23   Ajewole, Christana, MD  methadone (DOLOPHINE) 10  MG/5ML solution Take 175 mg by mouth daily.    [provider]  ondansetron  (ZOFRAN ) 4 MG tablet Take 1 tablet (4 mg total) by mouth every 6 (six) hours. Patient not taking: Reported on 10/05/2023 08/20/22   Olson, Caitlin, MD  sertraline (ZOLOFT) 25 MG tablet Take 25 mg by mouth daily. Patient not taking: Reported on 10/05/2023 06/16/23   [provider]    Family History Family History  Problem Relation Age of Onset   Parkinson's disease Mother    Hypertension Mother    Rheum arthritis Mother    Heart failure Mother    Parkinson's disease Maternal Grandmother    Hypertension Maternal Grandmother    Glaucoma Maternal Grandmother    Lung cancer Maternal Grandmother    Colon cancer Neg Hx    Stomach cancer Neg Hx    Pancreatic cancer Neg Hx    Esophageal cancer Neg Hx     Social History Social History   Tobacco Use   Smoking status: Every Day    Current packs/day: 1.00    Average packs/day: 1 pack/day for 0.5 years (0.5 ttl pk-yrs)    Types: Cigarettes   Smokeless tobacco: Never  Vaping Use   Vaping status: Never Used  Substance Use Topics   Alcohol use: No   Drug use: Not Currently    Types: Heroin, Marijuana    Comment: hasn't used heroin since 06/01/2014     Allergies   Haldol [haloperidol decanoate] and Nitrates, organic   Review of Systems Review of Systems  Per HPI  Physical Exam Triage Vital Signs ED Triage Vitals  Encounter Vitals Group     BP 11/07/23 1030 (!) 148/83     Systolic BP Percentile --      Diastolic BP Percentile --      Pulse Rate 11/07/23 1030 65     Resp 11/07/23 1030 17     Temp 11/07/23 1030 97.9 F (36.6 C)     Temp Source 11/07/23 1030 Oral     SpO2 11/07/23 1030 95 %     Weight --      Height --      Head Circumference --      Peak Flow --      Pain Score 11/07/23 1026 0     Pain Loc --      Pain Education --      Exclude from Growth Chart --    No data found.  Updated Vital Signs BP (!) 148/83 (BP  Location: Right Arm)   Pulse 65   Temp 97.9 F (36.6 C) (Oral)  Resp 17   SpO2 95%   Visual Acuity Right Eye Distance:   Left Eye Distance:   Bilateral Distance:    Right Eye Near:   Left Eye Near:    Bilateral Near:     Physical Exam Vitals and nursing note reviewed.  Constitutional:      Appearance: Normal appearance.  HENT:     Head: Normocephalic and atraumatic.     Right Ear: Tympanic membrane, ear canal and external ear normal.     Left Ear: Tympanic membrane, ear canal and external ear normal.     Nose: Congestion present.     Mouth/Throat:     Mouth: Mucous membranes are moist.     Pharynx: Posterior oropharyngeal erythema present.  Eyes:     Conjunctiva/sclera: Conjunctivae normal.  Cardiovascular:     Rate and Rhythm: Normal rate and regular rhythm.     Heart sounds: Normal heart sounds. No murmur heard. Pulmonary:     Effort: Pulmonary effort is normal. No respiratory distress.     Breath sounds: No wheezing.  Skin:    General: Skin is warm and dry.  Neurological:     General: No focal deficit present.     Mental Status: She is alert.  Psychiatric:        Mood and Affect: Mood normal.      UC Treatments / Results  Labs (all labs ordered are listed, but only abnormal results are displayed) Labs Reviewed  POC SARS CORONAVIRUS 2 AG -  ED    EKG   Radiology No results found.  Procedures Procedures (including critical care time)  Medications Ordered in UC Medications - No data to display  Initial Impression / Assessment and Plan / UC Course  I have reviewed the triage vital signs and the nursing notes.  Pertinent labs & imaging results that were available during my care of the patient were reviewed by me and considered in my medical decision making (see chart for details).  Vitals in triage reviewed, patient is hemodynamically stable.  Lungs are vesicular, heart with regular rate and rhythm.  Congestion and posterior pharynx erythema  present on physical exam.  COVID-19 testing negative, suspect other viral URI.  Symptomatic management discussed.  Plan of care, follow-up care return precautions given, no questions at this time.     Final Clinical Impressions(s) / UC Diagnoses   Final diagnoses:  Viral URI with cough     Discharge Instructions      Your symptoms are consistent with a viral illness.  COVID testing was negative.  Take the Tessalon Perles every 8 hours as needed to help with cough suppression.  Mucinex will help loosen up any secretions.  Warm saline gargles, tea with honey and sleeping with a humidifier may help as well.  For any headache or fever you can alternate between 500 mg of Tylenol  and 800 mg of ibuprofen  every 4-6 hours.  Symptoms should improve over the next few days, his typical viral illnesses last around a week in duration or less.  Return to clinic or follow-up with your primary care provider for any new or urgent symptoms.   ED Prescriptions     Medication Sig Dispense Auth. Provider   benzonatate (TESSALON) 100 MG capsule Take 1 capsule (100 mg total) by mouth every 8 (eight) hours. 21 capsule Harlow Lighter, Emmersyn Kratzke  N, FNP   guaiFENesin (MUCINEX) 600 MG 12 hr tablet Take 1 tablet (600 mg total) by mouth 2 (two) times daily for 5  days. 10 tablet Harlow Lighter, Naleigha Raimondi  N, FNP      PDMP not reviewed this encounter.   Harlow Lighter, Dakayla Disanti  N, FNP 11/07/23 [phone]

## 2023-11-07 NOTE — Discharge Instructions (Addendum)
 Your symptoms are consistent with a viral illness.  COVID testing was negative.  Take the Tessalon Perles every 8 hours as needed to help with cough suppression.  Mucinex will help loosen up any secretions.  Warm saline gargles, tea with honey and sleeping with a humidifier may help as well.  For any headache or fever you can alternate between 500 mg of Tylenol  and 800 mg of ibuprofen  every 4-6 hours.  Symptoms should improve over the next few days, his typical viral illnesses last around a week in duration or less.  Return to clinic or follow-up with your primary care provider for any new or urgent symptoms.

## 2023-11-07 NOTE — ED Triage Notes (Signed)
 Pt states she has been seeing mother in hospital. She began coughing, having sore throat, headache, and congestion since Friday. She would like covid testing.  She also is worried about period she had ablation this year and was told she should still have periods but she has not. Also has been having hot flashes.

## 2024-02-08 ENCOUNTER — Encounter (HOSPITAL_BASED_OUTPATIENT_CLINIC_OR_DEPARTMENT_OTHER): Payer: MEDICAID | Attending: General Surgery | Admitting: General Surgery

## 2024-02-08 DIAGNOSIS — I890 Lymphedema, not elsewhere classified: Secondary | ICD-10-CM | POA: Diagnosis not present

## 2024-02-08 DIAGNOSIS — I872 Venous insufficiency (chronic) (peripheral): Secondary | ICD-10-CM | POA: Diagnosis present

## 2024-02-10 ENCOUNTER — Other Ambulatory Visit: Payer: Self-pay

## 2024-02-10 ENCOUNTER — Other Ambulatory Visit (HOSPITAL_COMMUNITY)
Admission: RE | Admit: 2024-02-10 | Discharge: 2024-02-10 | Disposition: A | Discharge status: Home / Self Care | Payer: MEDICAID | Source: Ambulatory Visit | Attending: Obstetrics & Gynecology | Admitting: Obstetrics & Gynecology

## 2024-02-10 ENCOUNTER — Encounter: Payer: Self-pay | Admitting: Obstetrics & Gynecology

## 2024-02-10 ENCOUNTER — Ambulatory Visit (INDEPENDENT_AMBULATORY_CARE_PROVIDER_SITE_OTHER): Payer: MEDICAID | Admitting: Obstetrics & Gynecology

## 2024-02-10 VITALS — BP 137/72 | HR 44 | Wt 206.4 lb

## 2024-02-10 DIAGNOSIS — Z01419 Encounter for gynecological examination (general) (routine) without abnormal findings: Secondary | ICD-10-CM | POA: Insufficient documentation

## 2024-02-10 DIAGNOSIS — Z113 Encounter for screening for infections with a predominantly sexual mode of transmission: Secondary | ICD-10-CM

## 2024-02-10 DIAGNOSIS — Z1211 Encounter for screening for malignant neoplasm of colon: Secondary | ICD-10-CM | POA: Diagnosis not present

## 2024-02-10 DIAGNOSIS — I872 Venous insufficiency (chronic) (peripheral): Secondary | ICD-10-CM | POA: Diagnosis not present

## 2024-02-10 DIAGNOSIS — N898 Other specified noninflammatory disorders of vagina: Secondary | ICD-10-CM | POA: Diagnosis not present

## 2024-02-10 DIAGNOSIS — Z1231 Encounter for screening mammogram for malignant neoplasm of breast: Secondary | ICD-10-CM | POA: Diagnosis not present

## 2024-02-10 DIAGNOSIS — R399 Unspecified symptoms and signs involving the genitourinary system: Secondary | ICD-10-CM

## 2024-02-10 DIAGNOSIS — N951 Menopausal and female climacteric states: Secondary | ICD-10-CM | POA: Diagnosis not present

## 2024-02-10 MED ORDER — CEFADROXIL 500 MG PO CAPS: 500.0000 mg | ORAL_CAPSULE | Freq: Two times a day (BID) | ORAL | 0 refills | Status: AC

## 2024-02-10 NOTE — Progress Notes (Signed)
 GYNECOLOGY ANNUAL PREVENTATIVE CARE ENCOUNTER NOTE  History:    Nancy Terry is a 48 y.o. G68P3000 female here for a routine annual gynecologic exam.  Current complaints: UTI symptoms, was treated a few weeks ago, but still has dysuria and bladder pain. Wants to be treated for UTI.  Also reports having some vaginal discharge, non-irritating.  Wants to know if she is menopausal, she does have significant hot flashes and night sweats.  Has talked about HRT with Dr. Jeralyn, she is not a good candidate given the suboptimal control of her HTN.  Denies abnormal vaginal bleeding or other gynecologic concerns.  Gynecologic History No LMP recorded. Patient has had an ablation. Last Pap: 03/30/2021. Result was normal with negative HPV Last Mammogram: 04/20/2023.  Result was normal Last Colonoscopy:Never had one.  Obstetric History OB History  Gravida Para Term Preterm AB Living  3 3 3  0 0 0  SAB IAB Ectopic Multiple Live Births  0 0 0 0 0    # Outcome Date GA Lbr Len/2nd Weight Sex Type Anes PTL Lv  3 Term      Vag-Spont     2 Term      Vag-Spont     1 Term      Vag-Spont       Past Medical History:  Diagnosis Date   Anxiety    Depression    Hepatitis C    Herpes infection    Hypertension    Kidney stones    Migraines    Narcotic abuse (HCC)     Past Surgical History:  Procedure Laterality Date   BREAST BIOPSY     DILITATION & CURRETTAGE/HYSTROSCOPY WITH NOVASURE ABLATION N/A 07/18/2023   Procedure: DILATATION & CURETTAGE/HYSTEROSCOPY WITH NOVASURE ABLATION;  Surgeon: Jeralyn Crutch, MD;  Location: [facility];  Service: Gynecology;  Laterality: N/A;   ELBOW SURGERY Left    KIDNEY STONE SURGERY     with stent placement   LITHOTRIPSY     TUBAL LIGATION      Current Outpatient Medications on File Prior to Visit  Medication Sig Dispense Refill   ALPRAZolam (XANAX) 0.25 MG tablet Take 0.25 mg by mouth 2 (two) times daily.     benzonatate  (TESSALON ) 100 MG  capsule Take 1 capsule (100 mg total) by mouth every 8 (eight) hours. 21 capsule 0   cholecalciferol (VITAMIN D3) 25 MCG (1000 UNIT) tablet Take 1,000 Units by mouth daily.     methadone (DOLOPHINE) 10 MG/5ML solution Take 175 mg by mouth daily.     Multiple Vitamins-Minerals (CENTRUM SILVER  50+WOMEN PO) Take by mouth.     sertraline (ZOLOFT) 25 MG tablet Take 25 mg by mouth daily.     gabapentin  (NEURONTIN ) 300 MG capsule Take 1 capsule (300 mg total) by mouth 2 (two) times daily as needed. (Patient not taking: Reported on 02/10/2024) 20 capsule 0   hydrOXYzine  (ATARAX ) 25 MG tablet Take 1 tablet (25 mg total) by mouth every 8 (eight) hours as needed for anxiety. (Patient not taking: Reported on 02/10/2024) 21 tablet 0   ondansetron  (ZOFRAN ) 4 MG tablet Take 1 tablet (4 mg total) by mouth every 6 (six) hours. (Patient not taking: Reported on 02/10/2024) 12 tablet 0   No current facility-administered medications on file prior to visit.    Allergies  Allergen Reactions   Haldol [Haloperidol Decanoate] Other (See Comments)    Hallucinations   Nitrates, Organic Other (See Comments)    headaches  Social History:  reports that she has been smoking cigarettes. She has a 0.5 pack-year smoking history. She has never used smokeless tobacco. She reports that she does not currently use drugs after having used the following drugs: Heroin and Marijuana. She reports that she does not drink alcohol.  Family History  Problem Relation Age of Onset   Parkinson's disease Mother    Hypertension Mother    Rheum arthritis Mother    Heart failure Mother    Parkinson's disease Maternal Grandmother    Hypertension Maternal Grandmother    Glaucoma Maternal Grandmother    Lung cancer Maternal Grandmother    Colon cancer Neg Hx    Stomach cancer Neg Hx    Pancreatic cancer Neg Hx    Esophageal cancer Neg Hx     The following portions of the patient's history were reviewed and updated as appropriate: allergies,  current medications, past family history, past medical history, past social history, past surgical history and problem list.  Review of Systems Pertinent items noted in HPI and remainder of comprehensive ROS otherwise negative.  Physical Exam:  BP 137/72   Pulse (!) 44   Wt 206 lb 6.4 oz (93.6 kg)   BMI 35.43 kg/m  CONSTITUTIONAL: Well-developed, well-nourished female in no acute distress.  HENT:  Normocephalic, atraumatic, External right and left ear normal.  EYES: Conjunctivae and EOM are normal. Pupils are equal, round, and reactive to light. No scleral icterus.  NECK: Normal range of motion, supple, no masses observed. SKIN: Skin is warm and dry. No rash noted. Not diaphoretic. No erythema. No pallor. MUSCULOSKELETAL: Normal range of motion. No tenderness.  No cyanosis, clubbing, or edema. NEUROLOGIC: Alert and oriented to person, place, and time. Normal muscle tone coordination.  PSYCHIATRIC: Normal mood and affect. Normal behavior. Normal judgment and thought content. CARDIOVASCULAR: Normal heart rate noted, regular rhythm RESPIRATORY: Clear to auscultation bilaterally. Effort and breath sounds normal, no problems with respiration noted. BREASTS: Symmetric in size. No masses, tenderness, skin changes, nipple drainage, or lymphadenopathy bilaterally. Performed in the presence of a chaperone. ABDOMEN: Soft, no distention noted.  No tenderness, rebound or guarding.  PELVIC: Normal appearing external genitalia and urethral meatus; normal appearing vaginal mucosa and cervix.  Clear vaginal discharge noted, testing sample obtained.  Pap smear obtained.  Normal uterine size, no other palpable masses, no uterine or adnexal tenderness.  Performed in the presence of a chaperone.  No results found for this or any previous visit (from the past 24 hours).   Assessment and Plan:     1. Perimenopausal vasomotor symptoms Patient was told that we will do a test for menopause today. She will  return for discussion about other medications that can be used for perimenopausal symptoms. - FSH - TSH Rfx on Abnormal to Free T4  2. Vaginal discharge - Cervicovaginal ancillary only( [place]) done  3. UTI symptoms Udip showed +nitrate +LE + blood. Will send culture, and treat presumptively. - Urine Culture - cefadroxil  (DURICEF) 500 MG capsule; Take 1 capsule (500 mg total) by mouth 2 (two) times daily for 5 days.  Dispense: 10 capsule; Refill: 0  4. Routine screening for STI (sexually transmitted infection) STI screen done, will follow up results and manage accordingly. - Cervicovaginal ancillary only( [place]) - RPR+HBsAg+HCVAb+HIV  5. Breast cancer screening by mammogram Mammogram to be scheduled - MM 3D SCREENING MAMMOGRAM BILATERAL BREAST; Future  6. Colon cancer screening Has appointment with GI doctor later this month  7. Well  woman exam with routine gynecological exam (Primary) - Cytology - PAP( [place]) Will follow up results of pap smear and manage accordingly.  Routine preventative health maintenance measures emphasized. Please refer to After Visit Summary for other counseling recommendations.      GLORIS HUGGER, MD, FACOG Obstetrician & Gynecologist, [facility] for [employer], [facility]

## 2024-02-11 LAB — RPR+HBSAG+HCVAB+...
HIV Screen 4th Generation wRfx: NONREACTIVE
Hep C Virus Ab: REACTIVE — AB
Hepatitis B Surface Ag: NEGATIVE
RPR Ser Ql: NONREACTIVE

## 2024-02-11 LAB — TSH RFX ON ABNORMAL TO FREE T4: TSH: 1.31 u[IU]/mL (ref 0.450–4.500)

## 2024-02-11 LAB — FOLLICLE STIMULATING HORMONE: FSH: 26.5 m[IU]/mL

## 2024-02-12 ENCOUNTER — Ambulatory Visit: Payer: Self-pay | Admitting: Obstetrics & Gynecology

## 2024-02-13 LAB — POCT URINALYSIS DIP (DEVICE)
Bilirubin Urine: NEGATIVE
Glucose, UA: NEGATIVE mg/dL
Ketones, ur: NEGATIVE mg/dL
Nitrite: POSITIVE — AB
Protein, ur: NEGATIVE mg/dL
Specific Gravity, Urine: 1.025 (ref 1.005–1.030)
Urobilinogen, UA: 1 mg/dL (ref 0.0–1.0)
pH: 7.5 (ref 5.0–8.0)

## 2024-02-13 LAB — CERVICOVAGINAL ANCILLARY ONLY
Bacterial Vaginitis (gardnerella): NEGATIVE
Candida Glabrata: NEGATIVE
Candida Vaginitis: NEGATIVE
Chlamydia: NEGATIVE
Comment: NEGATIVE
Comment: NEGATIVE
Comment: NEGATIVE
Comment: NEGATIVE
Comment: NEGATIVE
Comment: NORMAL
Neisseria Gonorrhea: NEGATIVE
Trichomonas: NEGATIVE

## 2024-02-14 ENCOUNTER — Telehealth: Payer: Self-pay | Admitting: Family Medicine

## 2024-02-14 LAB — URINE CULTURE

## 2024-02-14 NOTE — Telephone Encounter (Signed)
 Patient concern addressed in MyChart.   Kylyn Mcdade,RN

## 2024-02-14 NOTE — Telephone Encounter (Signed)
 Patient needs a nurse to give her a call back with her test results. She is not able to understand mychart.

## 2024-02-15 ENCOUNTER — Telehealth: Payer: Self-pay

## 2024-02-15 NOTE — Telephone Encounter (Addendum)
 Pt left voicemail stating she was in office 02/10/24 for annual visit, she doesn't understand or use MyChart and is requesting someone call her to discuss results.  She states she has been cramping and has developed a blister down there since her visit.  Her call back number is [phone].    [place], RN

## 2024-02-15 NOTE — Telephone Encounter (Signed)
 RN called pt per her request and clarified all lab results that were reviewed by Dr. Herchel.  Pt satisfied with information provided and states she will complete antibiotics ordered by provider.  She also mentioned she has a painful bump/blister in groin area and is worried it may be from a bug or something outside when she was sitting on ground.  Pt states she plans to go to urgent care for this skin concern.  Reminded pt of her appointment scheduled for 03/15/24 at 4:15pm at [facility].  Pt verbalized understanding and had no further questions at this time.    Waddell, RN

## 2024-02-16 ENCOUNTER — Ambulatory Visit
Admission: EM | Admit: 2024-02-16 | Discharge: 2024-02-16 | Disposition: A | Discharge status: Home / Self Care | Payer: MEDICAID | Attending: Physician Assistant | Admitting: Physician Assistant

## 2024-02-16 ENCOUNTER — Other Ambulatory Visit: Payer: Self-pay

## 2024-02-16 DIAGNOSIS — L02416 Cutaneous abscess of left lower limb: Secondary | ICD-10-CM

## 2024-02-16 DIAGNOSIS — R0989 Other specified symptoms and signs involving the circulatory and respiratory systems: Secondary | ICD-10-CM | POA: Diagnosis not present

## 2024-02-16 LAB — POCT RAPID STREP A (OFFICE): Rapid Strep A Screen: NEGATIVE

## 2024-02-16 LAB — POC COVID19/FLU A&B COMBO
Covid Antigen, POC: NEGATIVE
Influenza A Antigen, POC: NEGATIVE
Influenza B Antigen, POC: NEGATIVE

## 2024-02-16 LAB — CYTOLOGY - PAP
Comment: NEGATIVE
Diagnosis: REACTIVE
Diagnosis: REACTIVE

## 2024-02-16 NOTE — ED Triage Notes (Signed)
 Pt presents with complaints of abscess, nasal congestion, and sore throat.   Pt states she has an abscess on the left side in between upper left leg and groin area. Noticed this about two days ago. Sore and tender to the touch. Rates pain in this area an 8/10.  Pt also mentions nasal congestion and sore throat x 2-3 days. Spouse is ill as well. Denies fevers at home. OTC Ibuprofen  taken at home with no improvement. Rates throat pain a 5/10.

## 2024-02-16 NOTE — ED Provider Notes (Signed)
 [facility]    CSN: [id number] Arrival date & time: 02/16/24  1240      History   Chief Complaint Chief Complaint  Patient presents with   Abscess    Groin, noticed two days ago.    Nasal Congestion   Sore Throat    HPI Nancy Terry is a 48 y.o. female.   HPI  Discussed the use of AI scribe software for clinical note transcription with the patient, who gave verbal consent to proceed.  The patient presents with a sore, swollen bump between her legs and upper respiratory symptoms.  She has a sore, swollen bump located between her leg and private part, resembling a red blister, which appeared approximately three days ago. She is concerned it might be related to sitting on the ground during a church event, despite using a towel. Her OBGYN was unable to see her until next week, but provided antibiotics for a UTI, which she has not yet started.  She also has upper respiratory symptoms including watery eyes, scratchy throat, and a runny nose that dries up when she goes outside and returns indoors. These symptoms began about three days ago. No trouble breathing, coughing, wheezing, fevers, chills, or ear pain, although she has noticed increased ear wax on the left side. She has not taken any medication for these symptoms except for ibuprofen .  She mentions feeling fatigued, lacking appetite, and experiencing dizziness and lightheadedness over the past few days, which is unusual for her. She attributes some of these symptoms to her known anemia and recent menopause diagnosis. She has not been taking any specific treatment for these conditions.  Pt was started on Cefadroxil  for UTI by Ob/gyn    Past Medical History:  Diagnosis Date   Anxiety    Depression    Hepatitis C    Herpes infection    Hypertension    Kidney stones    Migraines    Narcotic abuse (HCC)     Patient Active Problem List   Diagnosis Date Noted   Dysfunctional uterine bleeding 07/18/2023   Iron   deficiency anemia, unspecified 03/14/2023   Anemia 03/10/2023   Skin ulcer, limited to breakdown of skin (HCC) 10/08/2021   Cannabis hyperemesis syndrome concurrent with and due to cannabis abuse (HCC) 10/08/2021   Morbid obesity (HCC) 10/08/2021   Hidradenitis 10/10/2019   Tobacco use 10/10/2019   Chronic pain syndrome 04/11/2018   Complex ovarian cyst 11/21/2017   Hypertension 11/21/2017   Menorrhagia 05/13/2017   Sciatica 05/13/2017   Migraines 05/07/2016   Hepatitis C 05/07/2016   Depression 04/05/2016   Anxiety and depression 04/05/2016   Narcotic abuse (HCC) 04/05/2016   Breast lump on right side at 10 o'clock position 03/12/2014   Pap smear of cervix shows high risk HPV present 03/12/2014    Past Surgical History:  Procedure Laterality Date   BREAST BIOPSY     DILITATION & CURRETTAGE/HYSTROSCOPY WITH NOVASURE ABLATION N/A 07/18/2023   Procedure: DILATATION & CURETTAGE/HYSTEROSCOPY WITH NOVASURE ABLATION;  Surgeon: Jeralyn Crutch, MD;  Location: [facility];  Service: Gynecology;  Laterality: N/A;   ELBOW SURGERY Left    KIDNEY STONE SURGERY     with stent placement   LITHOTRIPSY     TUBAL LIGATION      OB History     Gravida  3   Para  3   Term  3   Preterm  0   AB  0   Living  0  SAB  0   IAB  0   Ectopic  0   Multiple  0   Live Births  0            Home Medications    Prior to Admission medications   Medication Sig Start Date End Date Taking? Authorizing Provider  ALPRAZolam (XANAX) 0.25 MG tablet Take 0.25 mg by mouth 2 (two) times daily. 06/14/22   [provider]  benzonatate  (TESSALON ) 100 MG capsule Take 1 capsule (100 mg total) by mouth every 8 (eight) hours. 11/07/23   Dreama, [place]  N, FNP  cholecalciferol (VITAMIN D3) 25 MCG (1000 UNIT) tablet Take 1,000 Units by mouth daily.    [provider]  gabapentin  (NEURONTIN ) 300 MG capsule Take 1 capsule (300 mg total) by mouth 2 (two) times  daily as needed. Patient not taking: Reported on 02/10/2024 07/18/23   Ajewole, Christana, MD  hydrOXYzine  (ATARAX ) 25 MG tablet Take 1 tablet (25 mg total) by mouth every 8 (eight) hours as needed for anxiety. Patient not taking: Reported on 02/10/2024 07/18/23   Ajewole, Christana, MD  methadone (DOLOPHINE) 10 MG/5ML solution Take 175 mg by mouth daily.    [provider]  Multiple Vitamins-Minerals (CENTRUM SILVER  50+WOMEN PO) Take by mouth.    [provider]  ondansetron  (ZOFRAN ) 4 MG tablet Take 1 tablet (4 mg total) by mouth every 6 (six) hours. Patient not taking: Reported on 02/10/2024 08/20/22   Olson, Caitlin, MD  sertraline (ZOLOFT) 25 MG tablet Take 25 mg by mouth daily. 06/16/23   [provider]    Family History Family History  Problem Relation Age of Onset   Parkinson's disease Mother    Hypertension Mother    Rheum arthritis Mother    Heart failure Mother    Parkinson's disease Maternal Grandmother    Hypertension Maternal Grandmother    Glaucoma Maternal Grandmother    Lung cancer Maternal Grandmother    Colon cancer Neg Hx    Stomach cancer Neg Hx    Pancreatic cancer Neg Hx    Esophageal cancer Neg Hx     Social History Social History   Tobacco Use   Smoking status: Every Day    Current packs/day: 1.00    Average packs/day: 1 pack/day for 0.5 years (0.5 ttl pk-yrs)    Types: Cigarettes   Smokeless tobacco: Never  Vaping Use   Vaping status: Never Used  Substance Use Topics   Alcohol use: No   Drug use: Not Currently    Types: Heroin, Marijuana    Comment: hasn't used heroin since 06/01/2014     Allergies   Haldol [haloperidol decanoate] and Nitrates, organic   Review of Systems Review of Systems  Constitutional:  Positive for appetite change and fatigue. Negative for chills and fever.  HENT:  Positive for ear discharge (left has more wax than right), rhinorrhea and sore throat. Negative for congestion and ear pain.   Eyes:   Positive for discharge.  Respiratory:  Negative for cough, shortness of breath and wheezing.   Gastrointestinal:  Negative for diarrhea, nausea and vomiting.  Skin:        Concern for bump/abscess in groin area.   Neurological:  Positive for light-headedness.     Physical Exam Triage Vital Signs ED Triage Vitals  Encounter Vitals Group     BP 02/16/24 1251 133/83     Girls Systolic BP Percentile --      Girls Diastolic BP Percentile --  Boys Systolic BP Percentile --      Boys Diastolic BP Percentile --      Pulse Rate 02/16/24 1251 (!) 57     Resp 02/16/24 1251 18     Temp 02/16/24 1251 98.1 F (36.7 C)     Temp Source 02/16/24 1251 Oral     SpO2 02/16/24 1251 96 %     Weight 02/16/24 1251 207 lb (93.9 kg)     Height 02/16/24 1251 5' 4 (1.626 m)     Head Circumference --      Peak Flow --      Pain Score 02/16/24 1306 8     Pain Loc --      Pain Education --      Exclude from Growth Chart --    No data found.  Updated Vital Signs BP 133/83 (BP Location: Right Arm)   Pulse (!) 57   Temp 98.1 F (36.7 C) (Oral)   Resp 18   Ht 5' 4 (1.626 m)   Wt 207 lb (93.9 kg)   SpO2 96%   BMI 35.53 kg/m   Visual Acuity Right Eye Distance:   Left Eye Distance:   Bilateral Distance:    Right Eye Near:   Left Eye Near:    Bilateral Near:     Physical Exam Vitals reviewed.  Constitutional:      General: She is awake.     Appearance: Normal appearance. She is well-developed and well-groomed.  HENT:     Head: Normocephalic and atraumatic.     Right Ear: Hearing, tympanic membrane and ear canal normal.     Left Ear: Hearing, tympanic membrane and ear canal normal.     Mouth/Throat:     Lips: Pink.     Mouth: Mucous membranes are moist.     Pharynx: Oropharynx is clear. Uvula midline. No pharyngeal swelling, oropharyngeal exudate, posterior oropharyngeal erythema, uvula swelling or postnasal drip.  Cardiovascular:     Rate and Rhythm: Normal rate and regular  rhythm.     Pulses: Normal pulses.          Radial pulses are 2+ on the right side and 2+ on the left side.     Heart sounds: Normal heart sounds. No murmur heard.    No friction rub. No gallop.  Pulmonary:     Effort: Pulmonary effort is normal.     Breath sounds: Normal breath sounds. No decreased air movement. No decreased breath sounds, wheezing, rhonchi or rales.  Musculoskeletal:     Cervical back: Normal range of motion and neck supple.  Lymphadenopathy:     Head:     Right side of head: No submental, submandibular or preauricular adenopathy.     Left side of head: No submental, submandibular or preauricular adenopathy.     Cervical:     Right cervical: No superficial cervical adenopathy.    Left cervical: No superficial cervical adenopathy.     Upper Body:     Right upper body: No supraclavicular adenopathy.     Left upper body: No supraclavicular adenopathy.  Skin:     Neurological:     Mental Status: She is alert.  Psychiatric:        Behavior: Behavior is cooperative.      UC Treatments / Results  Labs (all labs ordered are listed, but only abnormal results are displayed) Labs Reviewed  POC COVID19/FLU A&B COMBO  POCT RAPID STREP A (OFFICE)    EKG  Radiology No results found.  Procedures Procedures (including critical care time)  Medications Ordered in UC Medications - No data to display  Initial Impression / Assessment and Plan / UC Course  I have reviewed the triage vital signs and the nursing notes.  Pertinent labs & imaging results that were available during my care of the patient were reviewed by me and considered in my medical decision making (see chart for details).      Final Clinical Impressions(s) / UC Diagnoses   Final diagnoses:  Symptoms of upper respiratory infection (URI)  Abscess of left thigh   Cutaneous abscess of groin Swollen, sore, red blister-like lesion between the leg and groin, present for three days. Concerned  about tick bite due to recent outdoor activity.  - Exam reveals approx 1 cm diameter fluctuant abscess. Discussed incision and drainage with patient along with complications and benefits of procedure- preferably a stab incision due to small size of abscess but patient declined stating she would prefer home measures. reviewed warm compresses and keeping area clean and dry with warm water  and gentle cleanser. Reviewed not to manipulate or squeeze the area to encourage drainage. Reviewed return precautions with patient. Follow up as needed.    Acute upper respiratory infection Symptoms include watery eyes, scratchy throat, runny nose, and intermittent dryness for three days. Negative for flu, COVID, and strep. No history of seasonal allergies. No fever, chills, or respiratory distress. Vitals and Physical exam are reassuring. Husband recently had a lung infection, negative for pneumonia. Recommend OTC medications for symptomatic relief. ED and return precautions reviewed and provided in AVS.      Discharge Instructions      Your testing was negative for COVID, flu and Strep.  Based on your described symptoms and the duration of symptoms it is likely that you have a viral upper respiratory infection (often called a cold)  Symptoms can last for 3-10 days with lingering cough and intermittent symptoms potentially  lasting several  weeks after that.  The goal of treatment at this time is to reduce your symptoms and discomfort   You can use the following medications and measures to help yourself feel better until your body fights this off: DayQuil/NyQuil, TheraFlu, Alka-Seltzer  (these medications typically have the same active ingredients in them so you can choose whichever one you prefer and take consistently during the day and night according to the manufactures instructions.) Flonase  A daily antihistamine such as Zyrtec, Claritin, Allegra per your preference.  Please choose 1 and take  consistently. Increased fluids.  It is recommended that you take in at least 64 ounces of water  per day when you are not sick so it is important to increase this when you are sick and your body may be running fever. Rest Cough drops Chloraseptic throat spray to help with sore throat Nasal saline spray or nasal flushes to help with congestion and runny nose  If your symptoms seem like they are getting worse over the next 5 to 7 days or not improving you can always follow-up here in urgent care or go to your primary care provider for further management. Go to the ER if you begin to have more serious symptoms such as shortness of breath, trouble breathing, loss of consciousness, swelling around the eyes, high fever, severe lasting headaches, vision changes or neck pain/stiffness.    For the abscess in your groin area I recommend the following: Warm compresses to the area, keeping the area clean and dry with  warm water  and gentle soap.  Please avoid manipulating or squeezing the area to encourage drainage as this can cause further pain as well as increase the risk of infection.  If you notice any of the following please return to urgent care: Copious amounts of drainage looks like pus, severe pain, swelling, redness expanding from the abscess site, fever chills not responding to Tylenol  and ibuprofen .     ED Prescriptions   None    PDMP not reviewed this encounter.   Marylene Rocky BRAVO, PA-C 02/16/24 [id number]

## 2024-02-16 NOTE — Discharge Instructions (Addendum)
 Your testing was negative for COVID, flu and Strep.  Based on your described symptoms and the duration of symptoms it is likely that you have a viral upper respiratory infection (often called a cold)  Symptoms can last for 3-10 days with lingering cough and intermittent symptoms potentially  lasting several  weeks after that.  The goal of treatment at this time is to reduce your symptoms and discomfort   You can use the following medications and measures to help yourself feel better until your body fights this off: DayQuil/NyQuil, TheraFlu, Alka-Seltzer  (these medications typically have the same active ingredients in them so you can choose whichever one you prefer and take consistently during the day and night according to the manufactures instructions.) Flonase  A daily antihistamine such as Zyrtec, Claritin, Allegra per your preference.  Please choose 1 and take consistently. Increased fluids.  It is recommended that you take in at least 64 ounces of water  per day when you are not sick so it is important to increase this when you are sick and your body may be running fever. Rest Cough drops Chloraseptic throat spray to help with sore throat Nasal saline spray or nasal flushes to help with congestion and runny nose  If your symptoms seem like they are getting worse over the next 5 to 7 days or not improving you can always follow-up here in urgent care or go to your primary care provider for further management. Go to the ER if you begin to have more serious symptoms such as shortness of breath, trouble breathing, loss of consciousness, swelling around the eyes, high fever, severe lasting headaches, vision changes or neck pain/stiffness.    For the abscess in your groin area I recommend the following: Warm compresses to the area, keeping the area clean and dry with warm water  and gentle soap.  Please avoid manipulating or squeezing the area to encourage drainage as this can cause further pain  as well as increase the risk of infection.  If you notice any of the following please return to urgent care: Copious amounts of drainage looks like pus, severe pain, swelling, redness expanding from the abscess site, fever chills not responding to Tylenol  and ibuprofen .

## 2024-02-23 ENCOUNTER — Ambulatory Visit: Payer: MEDICAID | Admitting: Obstetrics and Gynecology

## 2024-02-23 ENCOUNTER — Other Ambulatory Visit: Payer: Self-pay

## 2024-02-23 ENCOUNTER — Encounter: Payer: Self-pay | Admitting: Obstetrics and Gynecology

## 2024-02-23 VITALS — BP 123/79 | HR 60 | Wt 208.1 lb

## 2024-02-23 DIAGNOSIS — Z872 Personal history of diseases of the skin and subcutaneous tissue: Secondary | ICD-10-CM

## 2024-02-23 NOTE — Progress Notes (Signed)
 Obstetrics and Gynecology Visit Return Patient Evaluation  Appointment Date: 02/23/2024  Primary Care Provider: [facility]  [facility]  Chief Complaint: follow up urgent care visit  History of Present Illness:  Nancy Terry is a 48 y.o. who went to UC on 9/11 for a painful bump in the left inguinal fold lateral to the mons; patient had normal 9/5 GYN annual exam with Dr. Herchel and patient stated she had gone to an outdoor church even the weekend prior going to UC. Eval felt to be more c/w folliculitis and patient declined I&D at the time; she was already on duricef for a UTI diagnosed at her GYN annual visit  Interval History: Since that time, she states that she's feeling well and area feels healed. She states that she does shave but hadn't around the time this happened.   Review of Systems:  as noted in the History of Present Illness.  Patient Active Problem List   Diagnosis Date Noted   Dysfunctional uterine bleeding 07/18/2023   Iron  deficiency anemia, unspecified 03/14/2023   Anemia 03/10/2023   Skin ulcer, limited to breakdown of skin (HCC) 10/08/2021   Cannabis hyperemesis syndrome concurrent with and due to cannabis abuse (HCC) 10/08/2021   Morbid obesity (HCC) 10/08/2021   Hidradenitis 10/10/2019   Tobacco use 10/10/2019   Chronic pain syndrome 04/11/2018   Complex ovarian cyst 11/21/2017   Hypertension 11/21/2017   Menorrhagia 05/13/2017   Sciatica 05/13/2017   Migraines 05/07/2016   Hepatitis C 05/07/2016   Depression 04/05/2016   Anxiety and depression 04/05/2016   Narcotic abuse (HCC) 04/05/2016   Breast lump on right side at 10 o'clock position 03/12/2014   Pap smear of cervix shows high risk HPV present 03/12/2014   Medications:  Nancy Terry had no medications administered during this visit. Current Outpatient Medications  Medication Sig Dispense Refill   ALPRAZolam (XANAX) 0.25 MG tablet Take  0.25 mg by mouth 2 (two) times daily.     benzonatate  (TESSALON ) 100 MG capsule Take 1 capsule (100 mg total) by mouth every 8 (eight) hours. 21 capsule 0   cholecalciferol (VITAMIN D3) 25 MCG (1000 UNIT) tablet Take 1,000 Units by mouth daily.     methadone (DOLOPHINE) 10 MG/5ML solution Take 175 mg by mouth daily.     Multiple Vitamins-Minerals (CENTRUM SILVER  50+Terry PO) Take by mouth.     sertraline (ZOLOFT) 25 MG tablet Take 25 mg by mouth daily.     gabapentin  (NEURONTIN ) 300 MG capsule Take 1 capsule (300 mg total) by mouth 2 (two) times daily as needed. (Patient not taking: Reported on 02/23/2024) 20 capsule 0   hydrOXYzine  (ATARAX ) 25 MG tablet Take 1 tablet (25 mg total) by mouth every 8 (eight) hours as needed for anxiety. (Patient not taking: Reported on 02/23/2024) 21 tablet 0   ondansetron  (ZOFRAN ) 4 MG tablet Take 1 tablet (4 mg total) by mouth every 6 (six) hours. (Patient not taking: Reported on 02/23/2024) 12 tablet 0   No current facility-administered medications for this visit.    Allergies: is allergic to haldol [haloperidol decanoate] and nitrates, organic.  Physical Exam:  BP 123/79   Pulse 60   Wt 208 lb 1.6 oz (94.4 kg)   BMI 35.72 kg/m  Body mass index is 35.72 kg/m. General appearance: Well nourished, well developed female in no acute distress.  Abdomen: diffusely non tender to palpation, non distended, and no masses, hernias Neuro/Psych:  Normal mood and affect.  Pelvic exam: left inguinal fold half way down, lateral to the mons, shows a pinpoint area about a centimeter lateral of the fold that is nttp, with a small, scab over a healed area. exam chaperoned by CMA, .    Assessment: patient stable  Plan:  1. History of abscess of skin and subcutaneous tissue (Primary) I told her it's unknown if a tick bite but the person who saw her in UC didn't feel it was. I told her that if s/s ever re-occur to take a picture and let us  know  Future Appointments   Date Time Provider Department [facility]  03/15/2024  4:15 PM Jeralyn Crutch, MD [facility]  04/20/2024 11:50 AM [facility]    Bebe Izell Raddle MD Attending [facility])

## 2024-03-13 ENCOUNTER — Telehealth: Payer: Self-pay | Admitting: Internal Medicine

## 2024-03-13 NOTE — Telephone Encounter (Signed)
 Inbound all from patient requesting a f/u call to discuss from 2021. Please advise.

## 2024-03-14 NOTE — Telephone Encounter (Signed)
 Spoke with the patient. She needs a copy of her EGD procedure report. She asks that it be mailed to her. Address confirmed. Copy mailed.

## 2024-03-15 ENCOUNTER — Ambulatory Visit (INDEPENDENT_AMBULATORY_CARE_PROVIDER_SITE_OTHER): Payer: MEDICAID | Admitting: Obstetrics and Gynecology

## 2024-03-15 ENCOUNTER — Other Ambulatory Visit (HOSPITAL_COMMUNITY)
Admission: RE | Admit: 2024-03-15 | Discharge: 2024-03-15 | Disposition: A | Discharge status: Home / Self Care | Payer: MEDICAID | Source: Ambulatory Visit | Attending: Obstetrics and Gynecology | Admitting: Obstetrics and Gynecology

## 2024-03-15 ENCOUNTER — Other Ambulatory Visit: Payer: Self-pay

## 2024-03-15 VITALS — BP 135/81 | HR 62 | Wt 200.0 lb

## 2024-03-15 DIAGNOSIS — N958 Other specified menopausal and perimenopausal disorders: Secondary | ICD-10-CM | POA: Diagnosis not present

## 2024-03-15 DIAGNOSIS — R399 Unspecified symptoms and signs involving the genitourinary system: Secondary | ICD-10-CM | POA: Diagnosis not present

## 2024-03-15 DIAGNOSIS — N9489 Other specified conditions associated with female genital organs and menstrual cycle: Secondary | ICD-10-CM

## 2024-03-15 DIAGNOSIS — Z1331 Encounter for screening for depression: Secondary | ICD-10-CM

## 2024-03-15 LAB — POCT URINALYSIS DIP (DEVICE)
Bilirubin Urine: NEGATIVE
Glucose, UA: NEGATIVE mg/dL
Ketones, ur: NEGATIVE mg/dL
Nitrite: POSITIVE — AB
Protein, ur: NEGATIVE mg/dL
Specific Gravity, Urine: >=1.03 (ref 1.005–1.030)
Urobilinogen, UA: 0.2 mg/dL (ref 0.0–1.0)
pH: 6 (ref 5.0–8.0)

## 2024-03-15 MED ORDER — ESTRADIOL 10 MCG VA TABS: 1.0000 | ORAL_TABLET | Freq: Every day | VAGINAL | 12 refills | Status: DC

## 2024-03-15 NOTE — Progress Notes (Signed)
 GYNECOLOGY VISIT  Patient name: Nancy Terry MRN [id number]  Date of birth: [birth year] Chief Complaint:   Gynecologic Exam  History:  Burning when she pees. Regular doc prescribed ABX (for e.coli uti), which made her feel worse. Didn't take the abx that was prescribed by our office. Monistat made it worse. Abx made her bloated and nausea an dcouldn't pee. Monistat made the burning worse. Got azo but hasn't taken them because not sure if it will help. Takign azo menopause which is helping with sweating in her sleep. Has never had e coli before so concfused as to source. Onset of burnign since yearly exam  Taking iron  and multivitamin Burning started after yearly exam - prior to exam was not having any problems. Not currently sexually active and nothing has been in the vagina with the exception of the ablation or the yearly exam The antibiotic called in by regular doctor was and old antibiotic Did not have any burning prior to the exam  Hasn't change the soap or washing powder Felt it was really bad for about 1 week; not very bad during the day. At night when she takes the medication for menopause and her other supplements (which she has been taking for along time) feels the onset of the While laying down will feel a burnign seantion and feels heat/fire in the vagina Today when voiding, felt    The following portions of the patient's history were reviewed and updated as appropriate: allergies, current medications, past family history, past medical history, past social history, past surgical history and problem list.   Health Maintenance:   Last pap     Component Value Date/Time   DIAGPAP  02/10/2024 1101    - Negative for Intraepithelial Lesions or Malignancy (NILM)   DIAGPAP - Benign reactive/reparative changes 02/10/2024 1101   DIAGPAP  03/30/2021 1108    - Negative for intraepithelial lesion or malignancy (NILM)   HPVHIGH Negative 02/10/2024 1101   HPVHIGH Negative 03/30/2021  1108   HPVHIGH Positive (A) 08/14/2019 1356   ADEQPAP  02/10/2024 1101    Satisfactory for evaluation; transformation zone component PRESENT.   ADEQPAP  03/30/2021 1108    Satisfactory for evaluation; transformation zone component PRESENT.   ADEQPAP  08/14/2019 1356    Satisfactory for evaluation; transformation zone component PRESENT.    Health Maintenance  Topic Date Due   Pneumococcal Vaccine (1 of 2 - PCV) Never done   Hepatitis B Vaccine (1 of 3 - 19+ 3-dose series) Never done   Colon Cancer Screening  Never done   Flu Shot  01/06/2024   COVID-19 Vaccine (3 - 2025-26 season) 02/06/2024   Pap Smear  02/09/2025   Breast Cancer Screening  04/19/2025   DTaP/Tdap/Td vaccine (2 - Td or Tdap) 07/27/2028   Hepatitis C Screening  Completed   HIV Screening  Completed   HPV Vaccine  Aged Out   Meningitis B Vaccine  Aged Out      Review of Systems:  Pertinent items are noted in HPI. Comprehensive review of systems was otherwise negative.   Objective:  Physical Exam BP 135/81   Pulse 62   Wt 200 lb (90.7 kg)   BMI 34.33 kg/m    Physical Exam Vitals and nursing note reviewed. Exam conducted with a chaperone present.  Constitutional:      Appearance: Normal appearance.  HENT:     Head: Normocephalic and atraumatic.  Pulmonary:     Effort: Pulmonary effort is normal.  Genitourinary:  Comments: Allodynia of introitus Atrophic vaginal canal and cervix  Skin:    General: Skin is warm and dry.  Neurological:     General: No focal deficit present.     Mental Status: She is alert.  Psychiatric:        Mood and Affect: Mood normal.        Behavior: Behavior normal.        Thought Content: Thought content normal.        Judgment: Judgment normal.       Assessment & Plan:  1. Vaginal burning (Primary) 2. Genitourinary syndrome of menopause Likely having some GSM symptoms and possible reaction to lubricant vs irritation to having had vaginal examination  Exam performed  with water  only  Vaginitis swab collected Start vaginal estrogen - tablet offered as feels more comfortable with inserting tablet than applying cream to the vagina  - Cervicovaginal ancillary only - Estradiol 10 MCG TABS vaginal tablet; Place 1 tablet (10 mcg total) vaginally at bedtime.  Dispense: 12 tablet; Refill: 12   - Estradiol 10 MCG TABS vaginal tablet; Place 1 tablet (10 mcg total) vaginally at bedtime.  Dispense: 12 tablet; Refill: 12  3. UTI symptoms Repeat urine culture as concern for incompletely treated UTI given confusion about antibiotic regimen.  Will check the urine from this visit and if prior antibiotic will cover - Urine Culture   Carter Quarry, MD [facility], [facility]

## 2024-03-15 NOTE — Patient Instructions (Addendum)
 Bring a copy of your most recent:  - lipid profile - A1c  - current medication list - most recent liver function numbers    Nono-hormnal medication called Veozah to try for hot flashes

## 2024-03-19 ENCOUNTER — Ambulatory Visit: Payer: Self-pay | Admitting: Obstetrics and Gynecology

## 2024-03-19 LAB — CERVICOVAGINAL ANCILLARY ONLY
Bacterial Vaginitis (gardnerella): NEGATIVE
Candida Glabrata: NEGATIVE
Candida Vaginitis: NEGATIVE
Comment: NEGATIVE
Comment: NEGATIVE
Comment: NEGATIVE

## 2024-03-20 LAB — URINE CULTURE

## 2024-03-21 ENCOUNTER — Telehealth: Payer: Self-pay | Admitting: Lactation Services

## 2024-03-21 DIAGNOSIS — N958 Other specified menopausal and perimenopausal disorders: Secondary | ICD-10-CM

## 2024-03-21 DIAGNOSIS — N9489 Other specified conditions associated with female genital organs and menstrual cycle: Secondary | ICD-10-CM

## 2024-03-21 NOTE — Telephone Encounter (Signed)
-----   Message from Nancy Terry sent at 03/20/2024 10:24 PM EDT ----- Culture confirms UTI, ensure has appropriate antibiotic. Reports PA sent for vaginal estrogen, will need to be completed  ----- Message ----- From: Interface, Lab In Three [facility] Seven Sent: 03/19/2024  11:24 AM EDT To: Nancy Quarry, MD

## 2024-03-21 NOTE — Telephone Encounter (Signed)
 PA for Estradiol sent via covermymeds, awaiting determination.   Nancy Terry (Key: [id number]) PA Case ID #: [id number] Need Help? Call us  at [phone] Status sent iconSent to Plan today Drug Estradiol 10MCG tablets ePA cloud logo Form [employer] Electronic Prior Authorization Form

## 2024-03-21 NOTE — Telephone Encounter (Signed)
 RN called pt to inform her that prior authorization was submitted for estradiol. Advised pt that provider recommends she take the cefadroxil /duricef  as prescribed to UTI, she reported she hadn't finished the medication but plans to continue.  Updated pt that prior authorization had been submitted.  Pt verbalized understanding and had no further questions.     [place], RN

## 2024-03-22 MED ORDER — ESTRADIOL 10 MCG VA TABS: 1.0000 | ORAL_TABLET | VAGINAL | 12 refills | Status: DC

## 2024-03-22 NOTE — Addendum Note (Signed)
 Addended by: Kenitha Glendinning O on: 03/22/2024 12:13 PM   Modules accepted: Orders

## 2024-04-02 MED ORDER — ESTRADIOL 10 MCG VA TABS: ORAL_TABLET | VAGINAL | 12 refills | Status: AC

## 2024-04-02 NOTE — Telephone Encounter (Signed)
 Devere Sheldon (Key: [id number]) PA Case ID #: [id number] Need Help? Call us  at [phone] Outcome Denied on October 15 by [employer] 2017 Denied Drug Estradiol 10MCG tablets ePA cloud logo Form [employer] Electronic Prior Authorization Form  Estradiol denied, preferred medications are: Estring Vaginal Ring, Premarin vaginal Cream or Vagifem Vaginal Tablet. Will route to Dr. Jeralyn for advisement.

## 2024-04-02 NOTE — Addendum Note (Signed)
 Addended by: Itzayana Pardy O on: 04/02/2024 02:59 PM   Modules accepted: Orders

## 2024-04-19 ENCOUNTER — Emergency Department (HOSPITAL_BASED_OUTPATIENT_CLINIC_OR_DEPARTMENT_OTHER)
Admission: EM | Admit: 2024-04-19 | Discharge: 2024-04-19 | Disposition: A | Discharge status: Home / Self Care | Payer: MEDICAID

## 2024-04-19 ENCOUNTER — Emergency Department (HOSPITAL_BASED_OUTPATIENT_CLINIC_OR_DEPARTMENT_OTHER): Payer: MEDICAID

## 2024-04-19 ENCOUNTER — Other Ambulatory Visit: Payer: Self-pay

## 2024-04-19 ENCOUNTER — Encounter (HOSPITAL_BASED_OUTPATIENT_CLINIC_OR_DEPARTMENT_OTHER): Payer: Self-pay | Admitting: Emergency Medicine

## 2024-04-19 DIAGNOSIS — K5903 Drug induced constipation: Secondary | ICD-10-CM | POA: Insufficient documentation

## 2024-04-19 DIAGNOSIS — N390 Urinary tract infection, site not specified: Secondary | ICD-10-CM | POA: Insufficient documentation

## 2024-04-19 DIAGNOSIS — K5900 Constipation, unspecified: Secondary | ICD-10-CM | POA: Diagnosis present

## 2024-04-19 LAB — RESP PANEL BY RT-PCR (RSV, FLU A&B, COVID)  RVPGX2
Influenza A by PCR: NEGATIVE
Influenza B by PCR: NEGATIVE
Resp Syncytial Virus by PCR: NEGATIVE
SARS Coronavirus 2 by RT PCR: NEGATIVE

## 2024-04-19 LAB — CBC WITH DIFFERENTIAL/PLATELET
Abs Immature Granulocytes: 0.06 K/uL (ref 0.00–0.07)
Basophils Absolute: 0 K/uL (ref 0.0–0.1)
Basophils Relative: 0 %
Eosinophils Absolute: 0.2 K/uL (ref 0.0–0.5)
Eosinophils Relative: 2 %
HCT: 41.1 % (ref 36.0–46.0)
Hemoglobin: 13.7 g/dL (ref 12.0–15.0)
Immature Granulocytes: 1 %
Lymphocytes Relative: 16 %
Lymphs Abs: 1.4 K/uL (ref 0.7–4.0)
MCH: 28.1 pg (ref 26.0–34.0)
MCHC: 33.3 g/dL (ref 30.0–36.0)
MCV: 84.2 fL (ref 80.0–100.0)
Monocytes Absolute: 0.5 K/uL (ref 0.1–1.0)
Monocytes Relative: 6 %
Neutro Abs: 6.5 K/uL (ref 1.7–7.7)
Neutrophils Relative %: 75 %
Platelets: 225 K/uL (ref 150–400)
RBC: 4.88 MIL/uL (ref 3.87–5.11)
RDW: 14.4 % (ref 11.5–15.5)
WBC: 8.6 K/uL (ref 4.0–10.5)
nRBC: 0 % (ref 0.0–0.2)

## 2024-04-19 LAB — COMPREHENSIVE METABOLIC PANEL WITH GFR
ALT: 16 U/L (ref 0–44)
AST: 24 U/L (ref 15–41)
Albumin: 4.3 g/dL (ref 3.5–5.0)
Alkaline Phosphatase: 74 U/L (ref 38–126)
Anion gap: 10 (ref 5–15)
BUN: 8 mg/dL (ref 6–20)
CO2: 25 mmol/L (ref 22–32)
Calcium: 9.2 mg/dL (ref 8.9–10.3)
Chloride: 104 mmol/L (ref 98–111)
Creatinine, Ser: 0.71 mg/dL (ref 0.44–1.00)
GFR, Estimated: >60 mL/min (ref >60)
Glucose, Bld: 105 mg/dL — ABNORMAL HIGH (ref 70–99)
Potassium: 3.8 mmol/L (ref 3.5–5.1)
Sodium: 139 mmol/L (ref 135–145)
Total Bilirubin: 0.6 mg/dL (ref 0.0–1.2)
Total Protein: 7.3 g/dL (ref 6.5–8.1)

## 2024-04-19 LAB — URINALYSIS, MICROSCOPIC (REFLEX)

## 2024-04-19 LAB — GROUP A STREP BY PCR: Group A Strep by PCR: NOT DETECTED

## 2024-04-19 LAB — URINALYSIS, ROUTINE W REFLEX MICROSCOPIC
Bilirubin Urine: NEGATIVE
Glucose, UA: NEGATIVE mg/dL
Hgb urine dipstick: NEGATIVE
Ketones, ur: NEGATIVE mg/dL
Nitrite: POSITIVE — AB
Protein, ur: NEGATIVE mg/dL
Specific Gravity, Urine: 1.015 (ref 1.005–1.030)
pH: 8 (ref 5.0–8.0)

## 2024-04-19 LAB — PREGNANCY, URINE: Preg Test, Ur: NEGATIVE

## 2024-04-19 LAB — LIPASE, BLOOD: Lipase: 15 U/L (ref 11–51)

## 2024-04-19 MED ORDER — IOHEXOL 300 MG/ML  SOLN
80.0000 mL | Freq: Once | INTRAMUSCULAR | Status: AC | PRN
  Administered 2024-04-19: 80 mL via INTRAVENOUS

## 2024-04-19 MED ORDER — CEFADROXIL 500 MG PO CAPS: 500.0000 mg | ORAL_CAPSULE | Freq: Two times a day (BID) | ORAL | 0 refills | Status: AC

## 2024-04-19 MED ORDER — HYDRALAZINE HCL 10 MG PO TABS
10.0000 mg | ORAL_TABLET | Freq: Once | ORAL | Status: DC
  Filled 2024-04-19: qty 1

## 2024-04-19 MED ORDER — IBUPROFEN 400 MG PO TABS
600.0000 mg | ORAL_TABLET | Freq: Once | ORAL | Status: AC
  Administered 2024-04-19: 600 mg via ORAL
  Filled 2024-04-19: qty 1

## 2024-04-19 NOTE — ED Notes (Addendum)
 Charge RN in to see Pt.

## 2024-04-19 NOTE — ED Notes (Signed)
 Family updated as to patient's status.

## 2024-04-19 NOTE — ED Provider Notes (Signed)
 [place] Provider Note   CSN: [id number] Arrival date & time: 04/19/24  1027     Patient presents with: No chief complaint on file.   Nancy Terry is a 48 y.o. female.   Patient is here for evaluation of constipation x 6 days and throat discomfort since Sunday.  Patient has an extensive history of issues with constipation spanning half my life.  She takes senna S twice daily for the same; she does not use any daily over-the-counter fiber supplements, stool softeners, or laxatives.  She tried 1 dose of Dulcolax on Saturday without relief.  She has been able to pass small, hard bits of stool on Tuesday and denies diarrhea.  She denies vomiting though she admits she did try to vomit on Sunday for relief and was unable to.  She denies recent fevers, cough, congestion, urinary issues, or melena.  Reports abdominal bloating and generalized abdominal discomfort.  She does report a bit of bright red blood on toilet paper with bowel movement on Tuesday.  She denies history of hemorrhoids.  She does admit to sitting on the toilet for prolonged periods and straining.  Throat discomfort is described as soreness and swelling causing her to cut her food smaller.  She denies any choking or spitting up of food or liquids.  She states she has had difficulty swallowing large chunks of melon and has had to cut them down to carrot sized instead.  She has never had issues with swallowing in the past.  Denies tongue or lip swelling.  She denies any new medications.  She does report recent increase in her methadone dosing, including 5 mg increase last Monday as well as the same this Monday.  This was increased that she was having night sweats and cravings.  She is scheduled for a colonoscopy on November 17 for ongoing evaluation of recurrent constipation.  She also reports headache today that is light sensitive; she has a history of migraines.  She believes headache today  is due to the stress of recent symptoms.  She drinks about 32 ounces of water  daily.  She is active and walks dogs 3-4 times daily.  Only abdominal surgery history is hysterectomy.  Patient reports that EGD study 2 years ago.  I am unable to find an EGD more recent than 2021. Upper endoscopy performed in 2021 for evaluation of nausea and vomiting with largely unremarkable results.  The history is provided by the patient.  Constipation Severity:  Moderate Time since last bowel movement:  6 days Timing:  Intermittent Progression:  Worsening Chronicity:  Recurrent Context: medication and narcotics   Context: not dietary changes   Stool description:  Pellet like, small and hard Ineffective treatments:  Laxatives Associated symptoms: abdominal pain   Associated symptoms: no back pain, no diarrhea, no dysuria, no fever, no hematochezia, no nausea, no urinary retention and no vomiting   Risk factors: change in medication (Recent increase in methadone dosing), hx of abdominal surgery (Hysterectomy) and obesity   Risk factors: no recent antibiotic use, no recent illness and no recent surgery        Prior to Admission medications   Medication Sig Start Date End Date Taking? Authorizing Provider  ALPRAZolam (XANAX) 0.25 MG tablet Take 0.25 mg by mouth 2 (two) times daily. 06/14/22   [provider]  benzonatate  (TESSALON ) 100 MG capsule Take 1 capsule (100 mg total) by mouth every 8 (eight) hours. 11/07/23   Dreama, [place]  N,  FNP  cholecalciferol (VITAMIN D3) 25 MCG (1000 UNIT) tablet Take 1,000 Units by mouth daily.    [provider]  Estradiol (VAGIFEM) 10 MCG TABS vaginal tablet Place 1 tablet (10 mcg total) vaginally at bedtime for 14 days, THEN 1 tablet (10 mcg total) 2 (two) times a week for 14 days. 04/02/24 04/30/24  Ajewole, Christana, MD  gabapentin  (NEURONTIN ) 300 MG capsule Take 1 capsule (300 mg total) by mouth 2 (two) times daily as needed. Patient not taking:  Reported on 02/23/2024 07/18/23   Ajewole, Christana, MD  hydrOXYzine  (ATARAX ) 25 MG tablet Take 1 tablet (25 mg total) by mouth every 8 (eight) hours as needed for anxiety. Patient not taking: Reported on 02/23/2024 07/18/23   Ajewole, Christana, MD  methadone (DOLOPHINE) 10 MG/5ML solution Take 175 mg by mouth daily.    [provider]  Multiple Vitamins-Minerals (CENTRUM SILVER  50+WOMEN PO) Take by mouth.    [provider]  ondansetron  (ZOFRAN ) 4 MG tablet Take 1 tablet (4 mg total) by mouth every 6 (six) hours. Patient not taking: Reported on 02/23/2024 08/20/22   Olson, Caitlin, MD  sertraline (ZOLOFT) 25 MG tablet Take 25 mg by mouth daily. 06/16/23   [provider]    Allergies: Haldol [haloperidol decanoate] and Nitrates, organic    Review of Systems  Constitutional:  Negative for appetite change (decreased) and fever.  HENT:  Positive for trouble swallowing. Negative for congestion, facial swelling and voice change.        Dysphagia  Respiratory:  Negative for cough, choking and shortness of breath.   Cardiovascular:  Negative for chest pain, palpitations and leg swelling.  Gastrointestinal:  Positive for abdominal distention, abdominal pain, anal bleeding (Bright red small amount with straining and hard stool.) and constipation. Negative for blood in stool, diarrhea, hematochezia, nausea and vomiting.  Genitourinary:  Negative for difficulty urinating, dysuria, flank pain and hematuria.  Musculoskeletal:  Negative for back pain.  Skin:  Negative for color change.  Neurological:  Positive for headaches. Negative for dizziness, syncope, weakness and numbness.    Updated Vital Signs BP (!) 202/75 (BP Location: Right Arm)   Pulse 61   Temp 98.1 F (36.7 C) (Oral)   Resp (!) 21   Ht 5' 4 (1.626 m)   Wt 90.7 kg   SpO2 98%   BMI 34.32 kg/m   Physical Exam Vitals and nursing note reviewed.  Constitutional:      Appearance: Normal appearance. She is  obese.  HENT:     Head: Normocephalic and atraumatic.     Mouth/Throat:     Lips: Pink.     Mouth: Mucous membranes are moist. No angioedema.     Dentition: Has dentures (Dentures removed for exam). No gingival swelling.     Pharynx: No oropharyngeal exudate or posterior oropharyngeal erythema.  Eyes:     General: No scleral icterus.    Extraocular Movements: Extraocular movements intact.  Neck:     Trachea: Tracheal tenderness present. No tracheostomy, abnormal tracheal secretions or tracheal deviation.     Comments: Pain with palpation of cervical lymph node chain with no distinctive swelling of these nodes.  Pain with palpation over trachea with no obvious deformities or swelling. Cardiovascular:     Rate and Rhythm: Normal rate and regular rhythm.     Pulses: Normal pulses.     Heart sounds: Normal heart sounds.  Pulmonary:     Effort: Pulmonary effort is normal. No respiratory distress.  Breath sounds: Normal breath sounds. No stridor. No wheezing, rhonchi or rales.  Abdominal:     General: There is distension.     Palpations: Abdomen is soft. There is no mass.     Tenderness: There is abdominal tenderness. There is no guarding.  Musculoskeletal:     Cervical back: Normal range of motion. No signs of trauma. No spinous process tenderness.  Skin:    General: Skin is warm and dry.     Coloration: Skin is not jaundiced or pale.  Neurological:     Mental Status: She is alert and oriented to person, place, and time.  Psychiatric:        Mood and Affect: Mood normal.        Behavior: Behavior normal.        Thought Content: Thought content normal.        Judgment: Judgment normal.     (all labs ordered are listed, but only abnormal results are displayed) Labs Reviewed  GROUP A STREP BY PCR  RESP PANEL BY RT-PCR (RSV, FLU A&B, COVID)  RVPGX2  COMPREHENSIVE METABOLIC PANEL WITH GFR  LIPASE, BLOOD  CBC WITH DIFFERENTIAL/PLATELET  URINALYSIS, ROUTINE W REFLEX MICROSCOPIC   PREGNANCY, URINE    EKG: None  Radiology: No results found.   Procedures    Patient presents to the ED for concern of constipation and trouble swallowing, this involves an extensive number of treatment options, and is a complaint that carries with it a high risk of complications and morbidity.  The differential diagnosis includes bowel obstruction, opioid-induced constipation, malignancy, diet induced constipation, metabolic disturbance, volvulus, hypothyroidism, oropharyngeal neuromuscular disorder, achalasia, Schatzki ring, esophagitis, esophageal motility disorder, foreign body impaction.   Co morbidities that complicate the patient evaluation  Long term methadone use Chronic constipation episodes   Additional history obtained:  Additional history obtained from Outside Medical Records   External records from outside source obtained and reviewed including previous EGD.   Lab Tests:  I Ordered, and personally interpreted labs.  The pertinent results include: Lab work is largely unremarkable.  Urinalysis is consistent with a UTI.   Imaging Studies ordered:  I ordered imaging studies including CT abdomen pelvis and CT soft tissue neck. CT soft tissue neck showed no acute abnormalities.  There is a 1.9 cm right thyroid  nodule.  Recommendation for nonemergent thyroid  ultrasound. CT abdomen pelvis shows stool burden consistent with constipation and distal esophageal wall thickening suggestive of esophagitis.   Cardiac Monitoring:  The patient was maintained on a cardiac monitor.  I personally viewed and interpreted the cardiac monitored which showed an underlying rhythm of: Sinus bradycardia with no evidence of STEMI.   Medicines ordered and prescription drug management:  I ordered medication including ibuprofen   for headache  Reevaluation of the patient after these medicines showed that the patient improved I have reviewed the patients home medicines and have made  adjustments as needed   Problem List / ED Course:  Patient is here for evaluation of constipation and dysphagia.  Imaging and blood work performed.  No acute findings.  Urinalysis consistent with UTI; sent for cultures.   Reevaluation:  After the interventions noted above, I reevaluated the patient and found that they have :stable   Dispostion:  After consideration of the diagnostic results and the patients response to treatment, I feel that the patent would benefit from continued supportive care in the home setting.  Patient encouraged to begin a bowel regimen with fiber supplementation and laxatives in addition  to her stimulant laxatives.  She will follow-up with her primary care specialist and her GI specialist.  Prescription sent for cefadroxil  to treat UTI.    Medications Ordered in the ED  ibuprofen  (ADVIL ) tablet 600 mg (600 mg Oral Given 04/19/24 1146)                                   Medical Decision Making Amount and/or Complexity of Data Reviewed Labs: ordered. Radiology: ordered.  Risk Prescription drug management.     Final diagnoses:  None    ED Discharge Orders     None          Arlenis Blaydes A, PA-C 04/19/24 [address], Megan L, DO 04/20/24 [phone]

## 2024-04-19 NOTE — ED Notes (Signed)
Lab notified of urine culture add on 

## 2024-04-19 NOTE — ED Notes (Signed)
 Pt having episodes of HR dropping to 38 -39. Denies dizziness or lightheaded. EDP aware. Will continue to monitor

## 2024-04-19 NOTE — ED Notes (Signed)
 Pt. Reports she took 180mg  of Methadone today and that the Dr. Has gone up with her Methadone 10mg  in the last 2 wks.  Pt. HR is 37-41 she is SB on Heart Monitor and reports constipation for several days.  Pt. Reports abd. Pain and throat pain.SABRA

## 2024-04-19 NOTE — ED Triage Notes (Signed)
 Sore throat x 4 days  and she has been constipated  x  days also x 6 days  she has a h/a

## 2024-04-19 NOTE — Discharge Instructions (Addendum)
 As we discussed today there were no acute findings on her imaging and blood work was normal.  For your constipation, I would recommend a daily fiber supplement.  You can also begin MiraLAX twice daily until normal bowel movements.  Continue to follow with your GI specialist and PCP regarding your recurrent constipation.  Follow-up with your GI specialist regarding swallowing symptoms.  However if these symptoms continue to worsen and you are not able to tolerate foods or liquids please return to our facility for further evaluation.  Follow-up with your primary care physician regarding enlarged thyroid  nodule for a nonemergent ultrasound workup.

## 2024-04-19 NOTE — ED Notes (Signed)
 ED Provider at bedside.

## 2024-04-20 ENCOUNTER — Ambulatory Visit
Admission: RE | Admit: 2024-04-20 | Discharge: 2024-04-20 | Disposition: A | Discharge status: Home / Self Care | Payer: MEDICAID | Source: Ambulatory Visit | Attending: Obstetrics & Gynecology | Admitting: Obstetrics & Gynecology

## 2024-04-20 DIAGNOSIS — Z1231 Encounter for screening mammogram for malignant neoplasm of breast: Secondary | ICD-10-CM

## 2024-04-21 LAB — URINE CULTURE: Culture: >=100000 — AB

## 2024-04-22 ENCOUNTER — Telehealth (HOSPITAL_BASED_OUTPATIENT_CLINIC_OR_DEPARTMENT_OTHER): Payer: Self-pay | Admitting: *Deleted

## 2024-04-22 NOTE — Telephone Encounter (Signed)
 Post ED Visit - Positive Culture Follow-up  Culture report reviewed by antimicrobial stewardship pharmacist: [facility] []  Rankin Dee, Pharm.D. []  Venetia Gully, Pharm.D., BCPS AQ-ID []  Garrel Crews, Pharm.D., BCPS []  Almarie Lunger, Pharm.D., BCPS []  [place], [address].D., BCPS, AAHIVP []  Rosaline Bihari, Pharm.D., BCPS, AAHIVP []  Vernell Meier, PharmD, BCPS []  Latanya Hint, PharmD, BCPS []  Donald Medley, PharmD, BCPS []  Rocky Bold, PharmD []  Dorothyann Alert, PharmD, BCPS [x]  Dorn Poot,  PharmD  [facility] []  Rosaline Edison, PharmD []  Romona Bliss, PharmD []  Dolphus Roller, PharmD []  Veva Seip, Rph []  Vernell Daunt) Leonce, PharmD []  Eva Allis, PharmD []  Rosaline Millet, PharmD []  Iantha Batch, PharmD []  Arvin Gauss, PharmD []  Wanda Hasting, PharmD []  Ronal Rav, PharmD []  Rocky Slade, PharmD []  Bard Jeans, PharmD   Positive urine culture Treated with Cefadroxil , organism sensitive to the same and no further patient follow-up is required at this time.  Nancy Terry 04/22/2024, 10:21 AM

## 2024-04-26 ENCOUNTER — Ambulatory Visit: Payer: MEDICAID | Admitting: Obstetrics and Gynecology

## 2024-04-26 ENCOUNTER — Other Ambulatory Visit: Payer: Self-pay

## 2024-04-26 VITALS — BP 117/76 | HR 45 | Wt 203.5 lb

## 2024-04-26 DIAGNOSIS — N9489 Other specified conditions associated with female genital organs and menstrual cycle: Secondary | ICD-10-CM

## 2024-04-26 DIAGNOSIS — R399 Unspecified symptoms and signs involving the genitourinary system: Secondary | ICD-10-CM

## 2024-04-26 DIAGNOSIS — N958 Other specified menopausal and perimenopausal disorders: Secondary | ICD-10-CM

## 2024-04-26 NOTE — Progress Notes (Signed)
 GYNECOLOGY VISIT  Patient name: Nancy Terry MRN [id number]  Date of birth: 1975/12/17 Chief Complaint:   Follow-up  History:  Nancy Terry here for follow up of recurrent UTI and vaginal pain. Treated for UTI and started on vaginal estradiol  tablets  Was able to get the vagifem  curently using twice week for about 2 weeks.  Went back to the doctor/ED for 1.5 days due to having migraines that are returning and throat feels like it's closing and having difficulties with eating. Typically only has 1 week of constipation but this was longer - given medication to help with going to bathroom.  Taking the antibiotics that started this week and will return tomorrow.  Has been going to the bathroom frequently and having incontinence when walking. Supposed to have osmen look at liver and kidney - possibly an oncologist to furthe rinvestigae what's going on, being seen in February. Also getting imaging of 'thyroid ' behind left ear. Felt it started after starting vaginal estrogen. Had mammogram recently that was fine. Down to 1 cigarette a week.   The following portions of the patient's history were reviewed and updated as appropriate: allergies, current medications, past family history, past medical history, past social history, past surgical history and problem list.   Health Maintenance:   Last pap     Component Value Date/Time   DIAGPAP  02/10/2024 1101    - Negative for Intraepithelial Lesions or Malignancy (NILM)   DIAGPAP - Benign reactive/reparative changes 02/10/2024 1101   DIAGPAP  03/30/2021 1108    - Negative for intraepithelial lesion or malignancy (NILM)   HPVHIGH Negative 02/10/2024 1101   HPVHIGH Negative 03/30/2021 1108   HPVHIGH Positive (A) 08/14/2019 1356   ADEQPAP  02/10/2024 1101    Satisfactory for evaluation; transformation zone component PRESENT.   ADEQPAP  03/30/2021 1108    Satisfactory for evaluation; transformation zone component PRESENT.   ADEQPAP  08/14/2019  1356    Satisfactory for evaluation; transformation zone component PRESENT.    Health Maintenance  Topic Date Due   Pneumococcal Vaccine (1 of 2 - PCV) Never done   Hepatitis B Vaccine (1 of 3 - 19+ 3-dose series) Never done   Colon Cancer Screening  Never done   Flu Shot  01/06/2024   COVID-19 Vaccine (3 - 2025-26 season) 02/06/2024   Pap Smear  02/09/2025   Breast Cancer Screening  04/20/2026   DTaP/Tdap/Td vaccine (2 - Td or Tdap) 07/27/2028   Hepatitis C Screening  Completed   HIV Screening  Completed   HPV Vaccine  Aged Out   Meningitis B Vaccine  Aged Out      Review of Systems:  Pertinent items are noted in HPI. Comprehensive review of systems was otherwise negative.   Objective:  Physical Exam BP 117/76   Pulse (!) 45   Wt 203 lb 8 oz (92.3 kg)   BMI 34.93 kg/m    Physical Exam Vitals and nursing note reviewed.  Constitutional:      Appearance: Normal appearance.  HENT:     Head: Normocephalic and atraumatic.  Pulmonary:     Effort: Pulmonary effort is normal.  Skin:    General: Skin is warm and dry.  Neurological:     General: No focal deficit present.     Mental Status: She is alert.  Psychiatric:        Mood and Affect: Mood normal.        Behavior: Behavior normal.  Thought Content: Thought content normal.        Judgment: Judgment normal.   \     Assessment & Plan:   1. Genitourinary syndrome of menopause (Primary) 2. Vaginal burning 3. UTI symptoms Discussed continued use of vaginal lubricant and vaginal estrogen. Reiterated that vaginal estrogen may take several weeks to see improvement. Ideally, vaginal estrogen will also decrease frequency of UTI occurrences. Recent positive urine culture and currently completing treatment.      Carter Quarry, MD [facility] for [facility], [facility]

## 2024-04-26 NOTE — Patient Instructions (Addendum)
 Nancy Terry available on amazon to purchase.  Continue to use the vaginal estrogen twice a week - can take up to 3 months to see improvement.  Coconut oil can also be used to help with vaginal moisture

## 2024-04-27 LAB — POCT URINALYSIS DIP (DEVICE)
Bilirubin Urine: NEGATIVE
Glucose, UA: NEGATIVE mg/dL
Ketones, ur: NEGATIVE mg/dL
Nitrite: NEGATIVE
Protein, ur: NEGATIVE mg/dL
Specific Gravity, Urine: >=1.03 (ref 1.005–1.030)
Urobilinogen, UA: 0.2 mg/dL (ref 0.0–1.0)
pH: 5.5 (ref 5.0–8.0)

## 2024-05-06 ENCOUNTER — Ambulatory Visit
Admission: EM | Admit: 2024-05-06 | Discharge: 2024-05-06 | Disposition: A | Discharge status: Home / Self Care | Payer: MEDICAID | Attending: Physician Assistant | Admitting: Physician Assistant

## 2024-05-06 ENCOUNTER — Other Ambulatory Visit: Payer: Self-pay

## 2024-05-06 DIAGNOSIS — B370 Candidal stomatitis: Secondary | ICD-10-CM | POA: Diagnosis not present

## 2024-05-06 DIAGNOSIS — R519 Headache, unspecified: Secondary | ICD-10-CM

## 2024-05-06 MED ORDER — ONDANSETRON HCL 4 MG/2ML IJ SOLN
4.0000 mg | Freq: Once | INTRAMUSCULAR | Status: AC
  Administered 2024-05-06: 4 mg via INTRAMUSCULAR

## 2024-05-06 MED ORDER — NYSTATIN 100000 UNIT/ML MT SUSP
5.0000 mL | Freq: Four times a day (QID) | OROMUCOSAL | 0 refills | Status: AC
  Filled 2024-05-06: qty 180, 9d supply, fill #0

## 2024-05-06 MED ORDER — KETOROLAC TROMETHAMINE 30 MG/ML IJ SOLN
30.0000 mg | Freq: Once | INTRAMUSCULAR | Status: AC
  Administered 2024-05-06: 30 mg via INTRAMUSCULAR

## 2024-05-06 MED ORDER — DIPHENHYDRAMINE HCL 50 MG/ML IJ SOLN
25.0000 mg | Freq: Once | INTRAMUSCULAR | Status: AC
  Administered 2024-05-06: 25 mg via INTRAMUSCULAR

## 2024-05-06 MED ORDER — LIDOCAINE VISCOUS HCL 2 % MT SOLN: 15.0000 mL | OROMUCOSAL | 0 refills | Status: AC | PRN

## 2024-05-06 NOTE — ED Triage Notes (Signed)
 Pt presents with complaints of bilateral ear ache, headache, sore throat, and tongue soreness x 2 weeks. Has been to primary care twice for reported symptoms. Prescribed medications are not helping. Currently on an antibiotic. Stopped taking yesterday. Took for about 11 days. Rates overall head pain a 10/10. Has been alternating Tylenol  + Ibuprofen  at home, no improvement/relief.

## 2024-05-06 NOTE — ED Provider Notes (Signed)
 [facility]    CSN: [id number] Arrival date & time: 05/06/24  1244      History   Chief Complaint Chief Complaint  Patient presents with   Sore Throat    HPI Nancy Terry is a 48 y.o. female.  has a past medical history of Anxiety, Depression, Hepatitis C, Herpes infection, Hypertension, Kidney stones, Migraines, and Narcotic abuse (HCC).   HPI Discussed the use of AI scribe software for clinical note transcription with the patient, who gave verbal consent to proceed. The patient presents with ear pain, headaches, and sore throat.  She has been experiencing ear pain, headaches, and a sore throat for the past two weeks. Initially, she received antibiotics and shots, which provided temporary relief. However, since starting the antibiotics, her throat and tongue feel like 'sandpaper' and 'raw'.  She is currently taking Bactrim , prescribed on April 24, 2024 for UTI by [facility]. She has not been monitoring her temperature but notes waking up with sweat-soaked clothes, necessitating a change of shirts. Initially, she had nasal congestion that improved after receiving shots, allowing her to expel green mucus, which has since cleared. However, she feels the congestion is returning.  She visited urgent care and her primary care doctor on two occasions, but records of these visits are not available. She recalls receiving three shots at her primary care doctor's office, which she believes were effective, but she is unsure of their purpose as all tests reportedly came back negative.  No fever, chills, cough, or trouble breathing. The primary issues are with her throat and tongue, and now her ears. She reports that her tongue does not feel too large for her mouth, but is painful.    She is concerned for her tongue and sore throat as well as severe 10/10 headache with light sensitivity. She denies trauma or head injury, vision changes.She states she has had the  headache for the last 3 days and she states she has a hx of migraines. She reports her headache feels like her usual migraine pain but also slightly different. She feels like this is being caused by her lack of oral intake and missing caffeine and sleep.  She reports phonophobia as well as tenderness to palpation of her head. She states she cannot get relief from alternating Tylenol  and Ibuprofen  at home, she feels like her head is going to explode  She denies nausea or vomiting  She states in the past she has needed a migraine cocktail from the ER    Review of patient AVS paperwork reveals the following: Pt was seen at [facility] on Nov 18th, 2025 for dysuria after she was seen in ER on 04/19/24 for same. Urine culture from ER visit demonstrates pansensitive e coli infection and was treated with Cefadroxil . CT of the neck was negative. She was seen at [facility] for follow up stating she still had UTI symptoms. At [facility] she was treated with Rocephin , Dexamethasone  injections and started on Bactrim  which she is still taking.        Past Medical History:  Diagnosis Date   Anxiety    Depression    Hepatitis C    Herpes infection    Hypertension    Kidney stones    Migraines    Narcotic abuse [facility])     Patient Active Problem List   Diagnosis Date Noted   Dysfunctional uterine bleeding 07/18/2023   Iron  deficiency anemia, unspecified 03/14/2023   Anemia 03/10/2023   Skin ulcer, limited  to breakdown of skin (HCC) 10/08/2021   Cannabis hyperemesis syndrome concurrent with and due to cannabis abuse 10/08/2021   Morbid obesity (HCC) 10/08/2021   Hidradenitis 10/10/2019   Tobacco use 10/10/2019   Chronic pain syndrome 04/11/2018   Complex ovarian cyst 11/21/2017   Hypertension 11/21/2017   Menorrhagia 05/13/2017   Sciatica 05/13/2017   Migraines 05/07/2016   Hepatitis C 05/07/2016   Depression 04/05/2016   Anxiety and depression 04/05/2016   Narcotic abuse (HCC) 04/05/2016    Breast lump on right side at 10 o'clock position 03/12/2014   Pap smear of cervix shows high risk HPV present 03/12/2014    Past Surgical History:  Procedure Laterality Date   BREAST BIOPSY     DILITATION & CURRETTAGE/HYSTROSCOPY WITH NOVASURE ABLATION N/A 07/18/2023   Procedure: DILATATION & CURETTAGE/HYSTEROSCOPY WITH NOVASURE ABLATION;  Surgeon: Jeralyn Crutch, MD;  Location: [facility];  Service: Gynecology;  Laterality: N/A;   ELBOW SURGERY Left    KIDNEY STONE SURGERY     with stent placement   LITHOTRIPSY     TUBAL LIGATION      OB History     Gravida  3   Para  3   Term  3   Preterm  0   AB  0   Living  0      SAB  0   IAB  0   Ectopic  0   Multiple  0   Live Births  0            Home Medications    Prior to Admission medications   Medication Sig Start Date End Date Taking? Authorizing Provider  lidocaine  (XYLOCAINE ) 2 % solution Use as directed 15 mLs in the mouth or throat as needed. 05/06/24  Yes Tahjanae Blankenburg E, PA-C  magic mouthwash (nystatin , lidocaine , diphenhydrAMINE , alum & mag hydroxide) suspension Swish and swallow 5 mLs 4 (four) times daily. 05/06/24  Yes June Rode E, PA-C  ALPRAZolam (XANAX) 0.25 MG tablet Take 0.25 mg by mouth 2 (two) times daily. 06/14/22   [provider]  benzonatate  (TESSALON ) 100 MG capsule Take 1 capsule (100 mg total) by mouth every 8 (eight) hours. 11/07/23   Dreama, [place]  N, FNP  cholecalciferol (VITAMIN D3) 25 MCG (1000 UNIT) tablet Take 1,000 Units by mouth daily.    [provider]  gabapentin  (NEURONTIN ) 300 MG capsule Take 1 capsule (300 mg total) by mouth 2 (two) times daily as needed. Patient not taking: Reported on 02/23/2024 07/18/23   Ajewole, Christana, MD  hydrOXYzine  (ATARAX ) 25 MG tablet Take 1 tablet (25 mg total) by mouth every 8 (eight) hours as needed for anxiety. Patient not taking: Reported on 02/23/2024 07/18/23   Ajewole, Christana, MD  methadone  (DOLOPHINE) 10 MG/5ML solution Take 175 mg by mouth daily.    [provider]  Multiple Vitamins-Minerals (CENTRUM SILVER  50+WOMEN PO) Take by mouth.    [provider]  ondansetron  (ZOFRAN ) 4 MG tablet Take 1 tablet (4 mg total) by mouth every 6 (six) hours. Patient not taking: Reported on 02/23/2024 08/20/22   Olson, Caitlin, MD  sertraline (ZOLOFT) 25 MG tablet Take 25 mg by mouth daily. 06/16/23   [provider]    Family History Family History  Problem Relation Age of Onset   Parkinson's disease Mother    Hypertension Mother    Rheum arthritis Mother    Heart failure Mother    Parkinson's disease Maternal Grandmother    Hypertension  Maternal Grandmother    Glaucoma Maternal Grandmother    Lung cancer Maternal Grandmother    Colon cancer Neg Hx    Stomach cancer Neg Hx    Pancreatic cancer Neg Hx    Esophageal cancer Neg Hx    Breast cancer Neg Hx     Social History Social History   Tobacco Use   Smoking status: Every Day    Current packs/day: 1.00    Average packs/day: 1 pack/day for 0.5 years (0.5 ttl pk-yrs)    Types: Cigarettes   Smokeless tobacco: Never  Vaping Use   Vaping status: Never Used  Substance Use Topics   Alcohol use: No   Drug use: Not Currently    Types: Heroin, Marijuana    Comment: hasn't used heroin since 06/01/2014     Allergies   Haldol [haloperidol decanoate] and Nitrates, organic   Review of Systems Review of Systems  Constitutional:  Positive for diaphoresis and fatigue. Negative for chills and fever.  HENT:  Positive for congestion, ear pain, rhinorrhea, sore throat and voice change.   Eyes:  Positive for photophobia. Negative for visual disturbance.  Respiratory:  Negative for cough, shortness of breath and wheezing.   Gastrointestinal:  Negative for nausea and vomiting.  Neurological:  Positive for headaches. Negative for syncope, facial asymmetry, speech difficulty and weakness.     Physical  Exam Triage Vital Signs ED Triage Vitals  Encounter Vitals Group     BP 05/06/24 1304 136/70     Girls Systolic BP Percentile --      Girls Diastolic BP Percentile --      Boys Systolic BP Percentile --      Boys Diastolic BP Percentile --      Pulse Rate 05/06/24 1304 (!) 53     Resp 05/06/24 1304 18     Temp 05/06/24 1304 98.2 F (36.8 C)     Temp Source 05/06/24 1304 Oral     SpO2 05/06/24 1304 96 %     Weight 05/06/24 1303 203 lb 7.8 oz (92.3 kg)     Height 05/06/24 1303 5' 4 (1.626 m)     Head Circumference --      Peak Flow --      Pain Score 05/06/24 1302 10     Pain Loc --      Pain Education --      Exclude from Growth Chart --    No data found.  Updated Vital Signs BP 136/70 (BP Location: Right Arm)   Pulse (!) 53   Temp 98.2 F (36.8 C) (Oral)   Resp 18   Ht 5' 4 (1.626 m)   Wt 203 lb 7.8 oz (92.3 kg)   SpO2 96%   BMI 34.93 kg/m   Visual Acuity Right Eye Distance:   Left Eye Distance:   Bilateral Distance:    Right Eye Near:   Left Eye Near:    Bilateral Near:     Physical Exam Vitals reviewed.  Constitutional:      General: She is awake. She is not in acute distress.    Appearance: Normal appearance. She is well-developed and well-groomed. She is not ill-appearing, toxic-appearing or diaphoretic.  HENT:     Head: Normocephalic and atraumatic.     Right Ear: Hearing, tympanic membrane and ear canal normal.     Left Ear: Hearing, tympanic membrane and ear canal normal.     Mouth/Throat:     Lips: Pink.  Mouth: Mucous membranes are moist.     Pharynx: Oropharynx is clear. Uvula midline. No pharyngeal swelling, oropharyngeal exudate, posterior oropharyngeal erythema, uvula swelling or postnasal drip.     Comments: Tongue is red and beefy in appearance. Some evidence of white plaques on top Scant petechiae along the roof of the mouth.  Cardiovascular:     Rate and Rhythm: Normal rate and regular rhythm.     Pulses: Normal pulses.           Radial pulses are 2+ on the right side and 2+ on the left side.     Heart sounds: Normal heart sounds. No murmur heard.    No friction rub. No gallop.  Pulmonary:     Effort: Pulmonary effort is normal.     Breath sounds: Normal breath sounds. No decreased air movement. No decreased breath sounds, wheezing, rhonchi or rales.  Musculoskeletal:     Cervical back: Normal range of motion and neck supple.  Lymphadenopathy:     Head:     Right side of head: No submental, submandibular or preauricular adenopathy.     Left side of head: No submental, submandibular or preauricular adenopathy.     Cervical:     Right cervical: No superficial cervical adenopathy.    Left cervical: No superficial cervical adenopathy.     Upper Body:     Right upper body: No supraclavicular adenopathy.     Left upper body: No supraclavicular adenopathy.  Neurological:     General: No focal deficit present.     Mental Status: She is alert and oriented to person, place, and time.     Cranial Nerves: No dysarthria or facial asymmetry.     Motor: No weakness, tremor or atrophy.     Gait: Gait is intact.  Psychiatric:        Mood and Affect: Mood normal.        Behavior: Behavior normal. Behavior is cooperative.      UC Treatments / Results  Labs (all labs ordered are listed, but only abnormal results are displayed) Labs Reviewed - No data to display  EKG   Radiology No results found.  Procedures Procedures (including critical care time)  Medications Ordered in UC Medications  ketorolac  (TORADOL ) 30 MG/ML injection 30 mg (30 mg Intramuscular Given 05/06/24 1426)  ondansetron  (ZOFRAN ) injection 4 mg (4 mg Intramuscular Given 05/06/24 1430)  diphenhydrAMINE  (BENADRYL ) injection 25 mg (25 mg Intramuscular Given 05/06/24 1428)    Initial Impression / Assessment and Plan / UC Course  I have reviewed the triage vital signs and the nursing notes.  Pertinent labs & imaging results that were available  during my care of the patient were reviewed by me and considered in my medical decision making (see chart for details).      Final Clinical Impressions(s) / UC Diagnoses   Final diagnoses:  Oral thrush  Acute nonintractable headache, unspecified headache type   Oral candidiasis Likely secondary to antibiotic use, presenting with red, inflamed tongue and sensation of sandpaper. Negative strep test. Symptoms exacerbated by antibiotic-induced disruption of oral flora, leading to yeast overgrowth. Not an allergic reaction but a side effect of antibiotic use. - Prescribed viscous lidocaine  for symptomatic relief tonight. - Prescribed Magic Mouthwash for treatment of oral candidiasis, to be picked up tomorrow. - Advised to continue current course of antibiotics unless otherwise directed by prescribing physician.  Headache:  Patient presents today with concerns of headache that she reports is 10/10 stating that  it is preventing her from sleeping and is not improving with alternating Tylenol  and ibuprofen .  She reports that she does have a history of migraines and this feels very similar to those headaches but she thinks it is exacerbated by her inability to eat much as well as lack of sleep.  She states that she does not have any abortive medications for this and in the past has had to have migraine cocktails to assist with relief.  Patient appears neurologically intact without evidence of unilateral weakness, facial drooping, difficulty speaking or confusion.  Will administer Toradol  30 mg injection, Zofran  4 mg injection, Benadryl  25 mg injection.  Patient states that she does have someone driving her home as she was advised about potential sedation from medications administered here.  Reviewed that if she does develop more severe neurological symptoms she should seek emergent assistance at the ER or call EMS.  Patient voices agreement understanding with recommendations.  Follow-up as  needed.   Discharge Instructions      VISIT SUMMARY:  You came in today with ear pain, headaches, and a sore throat that have been ongoing for the past two weeks. You have been taking antibiotics, which initially helped but have since caused your throat and tongue to feel raw and like sandpaper. You also mentioned waking up with night sweats and experiencing nasal congestion that had improved but seems to be returning.  YOUR PLAN:  -ORAL CANDIDIASIS: Oral candidiasis is a yeast infection in your mouth, likely caused by the antibiotics disrupting the normal balance of bacteria and yeast. Your tongue and throat feel raw and like sandpaper because of this. You have been prescribed viscous lidocaine  for relief tonight and Magic Mouthwash to treat the infection, which you can pick up tomorrow. Continue taking your current antibiotics unless your prescribing doctor advises otherwise.  Headache: You report that you are having a severe headache today.  During our discussion you reviewed with us  that you do have a history of migraines and you state that your headache feels very similar to your typical migraine headaches.  Since you are not having any obvious neurological symptoms I suspect that your headache is likely due to your ongoing symptoms, lack of adequate food intake and rest as well as a potential migraine.  We have provided you with a Toradol  30 mg injection, Benadryl  25 mg injection, Zofran  4 mg injection as part of a migraine cocktail to help with your symptoms and hopefully provide you with some rest.  It is likely that she will have some sedation and drowsiness following the Benadryl  but you have assured us  that you have someone driving you.  If you are still having severe headache pain following your management I do recommend going to the emergency room for further evaluation especially if you start to develop any of the following symptoms: Facial drooping, difficulty speaking, weakness on one  side of the body, confusion, loss of consciousness, nausea or vomiting, vision changes or vision loss  INSTRUCTIONS:  Please pick up the Magic Mouthwash tomorrow and use it as directed. Continue taking your current antibiotics unless your prescribing doctor advises otherwise. If your symptoms do not improve or worsen, please schedule a follow-up appointment.     ED Prescriptions     Medication Sig Dispense Auth. Provider   lidocaine  (XYLOCAINE ) 2 % solution Use as directed 15 mLs in the mouth or throat as needed. 100 mL Kiowa Peifer E, PA-C   magic mouthwash (nystatin , lidocaine , diphenhydrAMINE , alum &  mag hydroxide) suspension Swish and swallow 5 mLs 4 (four) times daily. 180 mL Beckham Capistran E, PA-C      PDMP not reviewed this encounter.   Marylene Rocky BRAVO, PA-C 05/06/24 1452

## 2024-05-06 NOTE — Discharge Instructions (Addendum)
 VISIT SUMMARY:  You came in today with ear pain, headaches, and a sore throat that have been ongoing for the past two weeks. You have been taking antibiotics, which initially helped but have since caused your throat and tongue to feel raw and like sandpaper. You also mentioned waking up with night sweats and experiencing nasal congestion that had improved but seems to be returning.  YOUR PLAN:  -ORAL CANDIDIASIS: Oral candidiasis is a yeast infection in your mouth, likely caused by the antibiotics disrupting the normal balance of bacteria and yeast. Your tongue and throat feel raw and like sandpaper because of this. You have been prescribed viscous lidocaine  for relief tonight and Magic Mouthwash to treat the infection, which you can pick up tomorrow. Continue taking your current antibiotics unless your prescribing doctor advises otherwise.  Headache: You report that you are having a severe headache today.  During our discussion you reviewed with us  that you do have a history of migraines and you state that your headache feels very similar to your typical migraine headaches.  Since you are not having any obvious neurological symptoms I suspect that your headache is likely due to your ongoing symptoms, lack of adequate food intake and rest as well as a potential migraine.  We have provided you with a Toradol  30 mg injection, Benadryl  25 mg injection, Zofran  4 mg injection as part of a migraine cocktail to help with your symptoms and hopefully provide you with some rest.  It is likely that she will have some sedation and drowsiness following the Benadryl  but you have assured us  that you have someone driving you.  If you are still having severe headache pain following your management I do recommend going to the emergency room for further evaluation especially if you start to develop any of the following symptoms: Facial drooping, difficulty speaking, weakness on one side of the body, confusion, loss of  consciousness, nausea or vomiting, vision changes or vision loss  INSTRUCTIONS:  Please pick up the Magic Mouthwash tomorrow and use it as directed. Continue taking your current antibiotics unless your prescribing doctor advises otherwise. If your symptoms do not improve or worsen, please schedule a follow-up appointment.

## 2024-05-07 ENCOUNTER — Other Ambulatory Visit: Payer: Self-pay

## 2024-06-28 ENCOUNTER — Other Ambulatory Visit (HOSPITAL_COMMUNITY): Payer: Self-pay | Admitting: Otolaryngology

## 2024-06-28 DIAGNOSIS — E041 Nontoxic single thyroid nodule: Secondary | ICD-10-CM

## 2024-07-12 ENCOUNTER — Ambulatory Visit (HOSPITAL_COMMUNITY)
Admission: RE | Admit: 2024-07-12 | Discharge: 2024-07-12 | Disposition: A | Payer: MEDICAID | Source: Ambulatory Visit | Attending: Otolaryngology | Admitting: Otolaryngology

## 2024-07-12 DIAGNOSIS — E041 Nontoxic single thyroid nodule: Secondary | ICD-10-CM

## 2024-08-23 ENCOUNTER — Ambulatory Visit: Payer: Self-pay | Admitting: Obstetrics and Gynecology
